# Patient Record
Sex: Female | Born: 1965 | Race: White | Hispanic: No | Smoking: Former smoker
Health system: Southern US, Community
[De-identification: ages and names within clinical notes are randomized; demographics above are authoritative.]

## PROBLEM LIST (undated history)

## (undated) DIAGNOSIS — Z8041 Family history of malignant neoplasm of ovary: Secondary | ICD-10-CM

## (undated) DIAGNOSIS — F329 Major depressive disorder, single episode, unspecified: Secondary | ICD-10-CM

## (undated) DIAGNOSIS — E7800 Pure hypercholesterolemia, unspecified: Secondary | ICD-10-CM

## (undated) DIAGNOSIS — C50919 Malignant neoplasm of unspecified site of unspecified female breast: Secondary | ICD-10-CM

## (undated) DIAGNOSIS — J449 Chronic obstructive pulmonary disease, unspecified: Secondary | ICD-10-CM

## (undated) DIAGNOSIS — C801 Malignant (primary) neoplasm, unspecified: Secondary | ICD-10-CM

## (undated) DIAGNOSIS — D649 Anemia, unspecified: Secondary | ICD-10-CM

## (undated) DIAGNOSIS — Z803 Family history of malignant neoplasm of breast: Secondary | ICD-10-CM

## (undated) DIAGNOSIS — Z973 Presence of spectacles and contact lenses: Secondary | ICD-10-CM

## (undated) DIAGNOSIS — I10 Essential (primary) hypertension: Secondary | ICD-10-CM

## (undated) DIAGNOSIS — T8859XA Other complications of anesthesia, initial encounter: Secondary | ICD-10-CM

## (undated) DIAGNOSIS — Z800 Family history of malignant neoplasm of digestive organs: Secondary | ICD-10-CM

## (undated) DIAGNOSIS — F32A Depression, unspecified: Secondary | ICD-10-CM

## (undated) DIAGNOSIS — T4145XA Adverse effect of unspecified anesthetic, initial encounter: Secondary | ICD-10-CM

## (undated) DIAGNOSIS — R011 Cardiac murmur, unspecified: Secondary | ICD-10-CM

## (undated) DIAGNOSIS — K219 Gastro-esophageal reflux disease without esophagitis: Secondary | ICD-10-CM

## (undated) DIAGNOSIS — E0500 Thyrotoxicosis with diffuse goiter without thyrotoxic crisis or storm: Secondary | ICD-10-CM

## (undated) DIAGNOSIS — J189 Pneumonia, unspecified organism: Secondary | ICD-10-CM

## (undated) HISTORY — DX: Depression, unspecified: F32.A

## (undated) HISTORY — DX: Major depressive disorder, single episode, unspecified: F32.9

## (undated) HISTORY — PX: CHOLECYSTECTOMY: SHX55

## (undated) HISTORY — PX: BREAST RECONSTRUCTION: SHX9

## (undated) HISTORY — DX: Family history of malignant neoplasm of digestive organs: Z80.0

## (undated) HISTORY — DX: Presence of spectacles and contact lenses: Z97.3

## (undated) HISTORY — PX: WISDOM TOOTH EXTRACTION: SHX21

## (undated) HISTORY — DX: Malignant (primary) neoplasm, unspecified: C80.1

## (undated) HISTORY — DX: Family history of malignant neoplasm of breast: Z80.3

## (undated) HISTORY — PX: MASTECTOMY: SHX3

## (undated) HISTORY — PX: BREAST FIBROADENOMA SURGERY: SHX580

## (undated) HISTORY — DX: Family history of malignant neoplasm of ovary: Z80.41

---

## 2007-07-11 ENCOUNTER — Other Ambulatory Visit: Admission: RE | Admit: 2007-07-11 | Discharge: 2007-07-11 | Payer: Self-pay | Admitting: Family Medicine

## 2008-07-25 ENCOUNTER — Other Ambulatory Visit: Admission: RE | Admit: 2008-07-25 | Discharge: 2008-07-25 | Payer: Self-pay | Admitting: Family Medicine

## 2009-07-28 ENCOUNTER — Encounter: Admission: RE | Admit: 2009-07-28 | Discharge: 2009-07-28 | Payer: Self-pay | Admitting: Obstetrics and Gynecology

## 2009-09-06 ENCOUNTER — Emergency Department: Payer: Self-pay | Admitting: Internal Medicine

## 2009-10-14 ENCOUNTER — Emergency Department (HOSPITAL_COMMUNITY): Admission: EM | Admit: 2009-10-14 | Discharge: 2009-10-15 | Payer: Self-pay | Admitting: Emergency Medicine

## 2009-10-15 ENCOUNTER — Inpatient Hospital Stay (HOSPITAL_COMMUNITY): Admission: AD | Admit: 2009-10-15 | Discharge: 2009-10-22 | Payer: Self-pay | Admitting: Psychiatry

## 2009-10-15 ENCOUNTER — Ambulatory Visit: Payer: Self-pay | Admitting: Psychiatry

## 2009-10-23 ENCOUNTER — Other Ambulatory Visit (HOSPITAL_COMMUNITY): Admission: RE | Admit: 2009-10-23 | Discharge: 2009-11-06 | Payer: Self-pay | Admitting: Psychiatry

## 2009-11-10 ENCOUNTER — Ambulatory Visit: Payer: Self-pay | Admitting: Psychiatry

## 2009-12-03 ENCOUNTER — Ambulatory Visit (HOSPITAL_COMMUNITY): Payer: Self-pay | Admitting: Psychiatry

## 2010-02-11 ENCOUNTER — Ambulatory Visit (HOSPITAL_COMMUNITY): Payer: Self-pay | Admitting: Physician Assistant

## 2010-04-28 ENCOUNTER — Encounter (HOSPITAL_COMMUNITY): Payer: Self-pay | Admitting: Physician Assistant

## 2010-04-28 DIAGNOSIS — F309 Manic episode, unspecified: Secondary | ICD-10-CM

## 2010-05-08 LAB — BASIC METABOLIC PANEL
Chloride: 106 mEq/L (ref 96–112)
GFR calc non Af Amer: >60 mL/min (ref >60)
Glucose, Bld: 97 mg/dL (ref 70–99)
Potassium: 4 mEq/L (ref 3.5–5.1)
Sodium: 138 mEq/L (ref 135–145)

## 2010-05-08 LAB — DIFFERENTIAL
Basophils Absolute: 0.1 10*3/uL (ref 0.0–0.1)
Basophils Relative: 1 % (ref 0–1)
Eosinophils Absolute: 0.6 10*3/uL (ref 0.0–0.7)
Neutro Abs: 4.1 10*3/uL (ref 1.7–7.7)

## 2010-05-08 LAB — CBC
HCT: 40.7 % (ref 36.0–46.0)
MCHC: 34.8 g/dL (ref 30.0–36.0)
MCV: 97.8 fL (ref 78.0–100.0)
Platelets: 234 10*3/uL (ref 150–400)
WBC: 6.9 10*3/uL (ref 4.0–10.5)

## 2010-05-08 LAB — SALICYLATE LEVEL: Salicylate Lvl: <4 mg/dL (ref 2.8–20.0)

## 2010-05-08 LAB — ACETAMINOPHEN LEVEL: Acetaminophen (Tylenol), Serum: <10 ug/mL — ABNORMAL LOW (ref 10–30)

## 2010-05-08 LAB — RAPID URINE DRUG SCREEN, HOSP PERFORMED
Amphetamines: POSITIVE — AB
Barbiturates: NOT DETECTED
Cocaine: NOT DETECTED

## 2010-05-08 LAB — TSH: TSH: 2.498 u[IU]/mL (ref 0.350–4.500)

## 2010-07-28 ENCOUNTER — Encounter (HOSPITAL_COMMUNITY): Payer: Self-pay | Admitting: Physician Assistant

## 2010-08-17 ENCOUNTER — Encounter (HOSPITAL_COMMUNITY): Payer: Self-pay | Admitting: Psychiatry

## 2010-08-17 DIAGNOSIS — F331 Major depressive disorder, recurrent, moderate: Secondary | ICD-10-CM

## 2010-11-09 ENCOUNTER — Encounter (INDEPENDENT_AMBULATORY_CARE_PROVIDER_SITE_OTHER): Payer: Self-pay | Admitting: Psychiatry

## 2010-11-09 DIAGNOSIS — F333 Major depressive disorder, recurrent, severe with psychotic symptoms: Secondary | ICD-10-CM

## 2010-11-25 ENCOUNTER — Encounter (INDEPENDENT_AMBULATORY_CARE_PROVIDER_SITE_OTHER): Payer: Self-pay | Admitting: Psychiatry

## 2010-11-25 DIAGNOSIS — F333 Major depressive disorder, recurrent, severe with psychotic symptoms: Secondary | ICD-10-CM

## 2010-12-16 ENCOUNTER — Encounter (INDEPENDENT_AMBULATORY_CARE_PROVIDER_SITE_OTHER): Payer: Self-pay | Admitting: Psychiatry

## 2010-12-16 DIAGNOSIS — F331 Major depressive disorder, recurrent, moderate: Secondary | ICD-10-CM

## 2011-01-20 ENCOUNTER — Encounter (HOSPITAL_COMMUNITY): Payer: Self-pay | Admitting: Psychiatry

## 2011-01-20 ENCOUNTER — Ambulatory Visit (HOSPITAL_COMMUNITY): Payer: Self-pay | Admitting: Psychiatry

## 2011-01-20 DIAGNOSIS — F331 Major depressive disorder, recurrent, moderate: Secondary | ICD-10-CM

## 2011-01-20 MED ORDER — BUPROPION HCL ER (XL) 300 MG PO TB24: 300.0000 mg | ORAL_TABLET | Freq: Every day | ORAL | Status: DC

## 2011-01-20 NOTE — Progress Notes (Signed)
Patient came for her followup appointment. She is taking Abilify now 5 mg which we reduced on her last visit from 10 mg. Patient doing better on 5 mg reported no crying spells depressive thoughts anger or sleep issue. However she recently has symptoms and complain of body ache and generalized weakness. She has taken some over-the-counter Benadryl which is helping her. Overall her depression has been stable she had a good Thanksgiving and she is working to had a good Christmas. She is more involved in her daily life and denies any crying spells. She denies any side effects of the Abilify and Wellbutrin.  Mental status emanation Patient is casually dressed and well-groomed. She is calm cooperative and maintained good eye contact. She denies any active or passive suicidal thinking. There are no psychotic symptoms present. She's alert and oriented x3. Her speech is soft clear and coherent. Her insight judgment and impulse control is okay.  Assessment Maj. depressive disorder  Plan I will continue her Wellbutrin and Abilify at present does. I explained risks and benefits of medication in detail. I will see her again in 2 months

## 2011-02-23 DIAGNOSIS — C801 Malignant (primary) neoplasm, unspecified: Secondary | ICD-10-CM

## 2011-02-23 HISTORY — DX: Malignant (primary) neoplasm, unspecified: C80.1

## 2011-03-03 ENCOUNTER — Other Ambulatory Visit: Payer: Self-pay | Admitting: Obstetrics and Gynecology

## 2011-03-03 DIAGNOSIS — Z1231 Encounter for screening mammogram for malignant neoplasm of breast: Secondary | ICD-10-CM

## 2011-03-04 ENCOUNTER — Ambulatory Visit
Admission: RE | Admit: 2011-03-04 | Discharge: 2011-03-04 | Disposition: A | Discharge status: Home / Self Care | Payer: Commercial Managed Care - PPO | Source: Ambulatory Visit | Attending: Obstetrics and Gynecology | Admitting: Obstetrics and Gynecology

## 2011-03-04 DIAGNOSIS — Z1231 Encounter for screening mammogram for malignant neoplasm of breast: Secondary | ICD-10-CM

## 2011-03-09 ENCOUNTER — Other Ambulatory Visit: Payer: Self-pay | Admitting: Obstetrics and Gynecology

## 2011-03-09 DIAGNOSIS — R928 Other abnormal and inconclusive findings on diagnostic imaging of breast: Secondary | ICD-10-CM

## 2011-03-15 ENCOUNTER — Encounter (HOSPITAL_COMMUNITY): Payer: Self-pay | Admitting: Psychiatry

## 2011-03-15 ENCOUNTER — Ambulatory Visit (INDEPENDENT_AMBULATORY_CARE_PROVIDER_SITE_OTHER): Payer: Commercial Managed Care - PPO | Admitting: Psychiatry

## 2011-03-15 VITALS — Wt 184.0 lb

## 2011-03-15 DIAGNOSIS — F329 Major depressive disorder, single episode, unspecified: Secondary | ICD-10-CM

## 2011-03-15 MED ORDER — ARIPIPRAZOLE 5 MG PO TABS: ORAL_TABLET | ORAL | Status: DC

## 2011-03-15 MED ORDER — BUPROPION HCL ER (XL) 300 MG PO TB24: 300.0000 mg | ORAL_TABLET | Freq: Every day | ORAL | Status: DC

## 2011-03-15 NOTE — Progress Notes (Signed)
Patient came for her followup appointment. She is taking Abilify 5 mg and doing very well. She denies any depressive thoughts crying spells or mood swings. She is sleeping well. She is still have a rash around her neck and recently seen her primary care physician but did not start any medication. Overall she feels her depression is under control. She had a quiet Christmas. She is more involved in her daily life. She denies any depressive thoughts helplessness or hopelessness. She is wondering if she can come off from Abilify completely. She is taking Wellbutrin XL 300 daily.  Mental status examination Patient is pleasant cooperative and appropriate to her age. Her speech is soft clear and coherent. She described her mood is anxious and her affect is mood congruent. She denies any active or passive suicidal thoughts or homicidal thoughts. There no psychotic symptoms present. She's alert and oriented x3. Her insight judgment and pulse control is okay.  Diagnosis Axis I Major depressive disorder Axis II deferred Axis III rash but no medication Axis IV mild to moderate Axis V 65-70  Plan I talked to the patient about her medication. I will cut down her Abilify to 2.5 mg however I reminded if she start feeling depressive again then she need to take 5 mg. I will continue her Wellbutrin. I explained risks and benefits of medication. I also explained side effects of medication including the metabolic side effects of Abilify. At this time patient does not have any tremors shakes or extrapyramidal side effects. I will see her again in 3 months

## 2011-03-23 ENCOUNTER — Ambulatory Visit
Admission: RE | Admit: 2011-03-23 | Discharge: 2011-03-23 | Disposition: A | Discharge status: Home / Self Care | Payer: Commercial Managed Care - PPO | Source: Ambulatory Visit | Attending: Obstetrics and Gynecology | Admitting: Obstetrics and Gynecology

## 2011-03-23 ENCOUNTER — Other Ambulatory Visit: Payer: Self-pay | Admitting: Diagnostic Radiology

## 2011-03-23 ENCOUNTER — Other Ambulatory Visit: Payer: Self-pay | Admitting: Obstetrics and Gynecology

## 2011-03-23 DIAGNOSIS — R928 Other abnormal and inconclusive findings on diagnostic imaging of breast: Secondary | ICD-10-CM

## 2011-03-23 DIAGNOSIS — R921 Mammographic calcification found on diagnostic imaging of breast: Secondary | ICD-10-CM

## 2011-03-24 ENCOUNTER — Other Ambulatory Visit: Payer: Self-pay | Admitting: Obstetrics and Gynecology

## 2011-03-24 ENCOUNTER — Ambulatory Visit
Admission: RE | Admit: 2011-03-24 | Discharge: 2011-03-24 | Disposition: A | Discharge status: Home / Self Care | Payer: Commercial Managed Care - PPO | Source: Ambulatory Visit | Attending: Obstetrics and Gynecology | Admitting: Obstetrics and Gynecology

## 2011-03-24 DIAGNOSIS — N63 Unspecified lump in unspecified breast: Secondary | ICD-10-CM

## 2011-03-24 DIAGNOSIS — C50911 Malignant neoplasm of unspecified site of right female breast: Secondary | ICD-10-CM

## 2011-03-24 DIAGNOSIS — R921 Mammographic calcification found on diagnostic imaging of breast: Secondary | ICD-10-CM

## 2011-03-26 ENCOUNTER — Other Ambulatory Visit: Payer: Self-pay | Admitting: *Deleted

## 2011-03-26 ENCOUNTER — Telehealth: Payer: Self-pay | Admitting: *Deleted

## 2011-03-26 DIAGNOSIS — D0510 Intraductal carcinoma in situ of unspecified breast: Secondary | ICD-10-CM

## 2011-03-26 NOTE — Telephone Encounter (Signed)
Confirmed [facility] for 03/31/11 at 0815 .  Instructions and contact information given.

## 2011-03-28 ENCOUNTER — Ambulatory Visit
Admission: RE | Admit: 2011-03-28 | Discharge: 2011-03-28 | Disposition: A | Discharge status: Home / Self Care | Payer: Commercial Managed Care - PPO | Source: Ambulatory Visit | Attending: Obstetrics and Gynecology | Admitting: Obstetrics and Gynecology

## 2011-03-28 DIAGNOSIS — C50911 Malignant neoplasm of unspecified site of right female breast: Secondary | ICD-10-CM

## 2011-03-28 MED ORDER — GADOBENATE DIMEGLUMINE 529 MG/ML IV SOLN
17.0000 mL | Freq: Once | INTRAVENOUS | Status: AC | PRN
  Administered 2011-03-28: 17 mL via INTRAVENOUS

## 2011-03-30 ENCOUNTER — Other Ambulatory Visit: Payer: Self-pay | Admitting: Obstetrics and Gynecology

## 2011-03-30 ENCOUNTER — Ambulatory Visit: Admission: RE | Admit: 2011-03-30 | Payer: Commercial Managed Care - PPO | Source: Ambulatory Visit

## 2011-03-30 ENCOUNTER — Telehealth: Payer: Self-pay | Admitting: Oncology

## 2011-03-30 ENCOUNTER — Ambulatory Visit
Admission: RE | Admit: 2011-03-30 | Discharge: 2011-03-30 | Disposition: A | Discharge status: Home / Self Care | Payer: Commercial Managed Care - PPO | Source: Ambulatory Visit | Attending: Obstetrics and Gynecology | Admitting: Obstetrics and Gynecology

## 2011-03-30 DIAGNOSIS — N63 Unspecified lump in unspecified breast: Secondary | ICD-10-CM

## 2011-03-30 DIAGNOSIS — R921 Mammographic calcification found on diagnostic imaging of breast: Secondary | ICD-10-CM

## 2011-03-30 NOTE — Telephone Encounter (Signed)
Robin from [facility] called me after receiving a call from a woman that resides in [place] by the name of Cecilio Asper;  She is this patient's mother.   Randa Evens is very anxious and scared that she is going to lose her daughter and now her granddaughter.   She stated that the granddaughter has a breast mass and called the [facility]- and was told she cannot have a mammogram until she is 70.   I explained to Randa Evens that typically mammograms are not a useful tool for young women- but explained that we do have a high risk clinic- and the patient and her daughters can be seen by that team.   The Patient is scheduled tomorrow in [facility]- so I will relay this information to Dr. Donnie Coffin who can speak to her about what to do regarding her daughters.

## 2011-03-31 ENCOUNTER — Other Ambulatory Visit: Payer: Commercial Managed Care - PPO | Admitting: Lab

## 2011-03-31 ENCOUNTER — Encounter: Payer: Self-pay | Admitting: *Deleted

## 2011-03-31 ENCOUNTER — Ambulatory Visit: Payer: Commercial Managed Care - PPO

## 2011-03-31 ENCOUNTER — Ambulatory Visit
Admission: RE | Admit: 2011-03-31 | Discharge: 2011-03-31 | Disposition: A | Discharge status: Home / Self Care | Payer: Commercial Managed Care - PPO | Source: Ambulatory Visit | Attending: Radiation Oncology | Admitting: Radiation Oncology

## 2011-03-31 ENCOUNTER — Encounter (INDEPENDENT_AMBULATORY_CARE_PROVIDER_SITE_OTHER): Payer: Self-pay | Admitting: General Surgery

## 2011-03-31 ENCOUNTER — Ambulatory Visit (HOSPITAL_BASED_OUTPATIENT_CLINIC_OR_DEPARTMENT_OTHER): Payer: Commercial Managed Care - PPO | Admitting: General Surgery

## 2011-03-31 ENCOUNTER — Ambulatory Visit: Payer: Commercial Managed Care - PPO | Attending: General Surgery | Admitting: Physical Therapy

## 2011-03-31 ENCOUNTER — Ambulatory Visit (HOSPITAL_BASED_OUTPATIENT_CLINIC_OR_DEPARTMENT_OTHER): Payer: Commercial Managed Care - PPO | Admitting: Oncology

## 2011-03-31 VITALS — BP 120/71 | HR 88 | Temp 98.2°F | Ht 64.5 in | Wt 183.2 lb

## 2011-03-31 DIAGNOSIS — C50911 Malignant neoplasm of unspecified site of right female breast: Secondary | ICD-10-CM

## 2011-03-31 DIAGNOSIS — C50919 Malignant neoplasm of unspecified site of unspecified female breast: Secondary | ICD-10-CM

## 2011-03-31 DIAGNOSIS — M25619 Stiffness of unspecified shoulder, not elsewhere classified: Secondary | ICD-10-CM | POA: Insufficient documentation

## 2011-03-31 DIAGNOSIS — F17200 Nicotine dependence, unspecified, uncomplicated: Secondary | ICD-10-CM

## 2011-03-31 DIAGNOSIS — C50912 Malignant neoplasm of unspecified site of left female breast: Secondary | ICD-10-CM

## 2011-03-31 DIAGNOSIS — D0590 Unspecified type of carcinoma in situ of unspecified breast: Secondary | ICD-10-CM

## 2011-03-31 DIAGNOSIS — Z720 Tobacco use: Secondary | ICD-10-CM

## 2011-03-31 DIAGNOSIS — D0510 Intraductal carcinoma in situ of unspecified breast: Secondary | ICD-10-CM

## 2011-03-31 DIAGNOSIS — IMO0001 Reserved for inherently not codable concepts without codable children: Secondary | ICD-10-CM | POA: Insufficient documentation

## 2011-03-31 DIAGNOSIS — F329 Major depressive disorder, single episode, unspecified: Secondary | ICD-10-CM

## 2011-03-31 LAB — CBC WITH DIFFERENTIAL/PLATELET
BASO%: 0.6 % (ref 0.0–2.0)
Eosinophils Absolute: 0.5 10*3/uL (ref 0.0–0.5)
MONO#: 0.6 10*3/uL (ref 0.1–0.9)
NEUT#: 3.5 10*3/uL (ref 1.5–6.5)
Platelets: 257 10*3/uL (ref 145–400)
RBC: 4.2 10*6/uL (ref 3.70–5.45)
RDW: 12.7 % (ref 11.2–14.5)
WBC: 6 10*3/uL (ref 3.9–10.3)
lymph#: 1.3 10*3/uL (ref 0.9–3.3)

## 2011-03-31 LAB — COMPREHENSIVE METABOLIC PANEL
ALT: 14 U/L (ref 0–35)
Albumin: 3.6 g/dL (ref 3.5–5.2)
CO2: 27 mEq/L (ref 19–32)
Glucose, Bld: 86 mg/dL (ref 70–99)
Potassium: 4.4 mEq/L (ref 3.5–5.3)
Sodium: 136 mEq/L (ref 135–145)
Total Protein: 7.3 g/dL (ref 6.0–8.3)

## 2011-03-31 NOTE — Progress Notes (Signed)
Patient ID: Donna Black, female   DOB: [birth year], 46 y.o.   MRN: [id number]  No chief complaint on file.   HPI Donna Black is a 46 y.o. female.  She is referred to me at the multidisciplinary clinic by Dr. Marice Potter at the breast center [place].  The patient has no major prior breast problems, although she has had fibroadenomas removed in the past.  She recently went for screening mammograms and subsequent ultrasound and a subsequent MRI. 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 There was a smaller area of microcalcifications in the upper-outer quadrant of the left breast which were biopsied and showed high-grade DCIS. Dr. Colonel Bald is performing further studies to see if there may be an area of invasion. She had an area in her left breast at the 6:00 position which was biopsied and shows a benign fibroadenoma.  There were two biopsies in the right breast and  one is strongly positive for ER/PR and the other  is negative for ER/PR.  We have discussed her case at multidisciplinary conference and in the clinic today. I am coordinating her care with Dr. Chipper Herb and Dr. Pierce Crane.  Significant medical history is that she has a major depressive disorder on medications, well controlled, tobacco abuse, remote history of Graves' disease treated with PTU but no radioiodine ablation. Her thyroid functions are followed by Dr. Johnn Hai. HPI  Past Medical History  Diagnosis Date  . Wears glasses   . Depression     Past Surgical History  Procedure Date  . Cholecystectomy   . Wisdom tooth extraction     Family History  Problem Relation Age of Onset  . Breast cancer Maternal Grandmother     Social History History  Substance Use Topics  . Smoking status: Current Everyday Smoker -- 0.5 packs/day    Types: Cigarettes  . Smokeless tobacco: Not on file    . Alcohol Use: 1.2 oz/week    2 Glasses of wine per week    No Known Allergies  Current Outpatient Prescriptions  Medication Sig Dispense Refill  . ARIPiprazole (ABILIFY) 5 MG tablet Take 1/2 to one  daily  90 tablet  0  . buPROPion (WELLBUTRIN XL) 300 MG 24 hr tablet Take 1 tablet (300 mg total) by mouth daily.  90 tablet  0    Review of Systems Review of Systems  Constitutional: Negative for fever, chills and unexpected weight change.  HENT: Negative for hearing loss, congestion, sore throat, trouble swallowing and voice change.   Eyes: Negative for visual disturbance.  Respiratory: Negative for cough and wheezing.   Cardiovascular: Negative for chest pain, palpitations and leg swelling.  Gastrointestinal: Negative for nausea, vomiting, abdominal pain, diarrhea, constipation, blood in stool, abdominal distention and anal bleeding.  Genitourinary: Negative for hematuria, vaginal bleeding and difficulty urinating.  Musculoskeletal: Negative for arthralgias.  Skin: Negative for rash and wound.  Neurological: Negative for seizures, syncope and headaches.  Hematological: Negative for adenopathy. Does not bruise/bleed easily.  Psychiatric/Behavioral: Negative for confusion. The patient is nervous/anxious.     There were no vitals taken for this visit.  Physical Exam Physical Exam  Constitutional: She is oriented to person, place, and time. She appears well-developed and well-nourished. No distress.  HENT:  Head: Normocephalic and atraumatic.  Nose: Nose normal.  Mouth/Throat: No oropharyngeal exudate.  Eyes: Conjunctivae and EOM are normal.  Pupils are equal, round, and reactive to light. Left eye exhibits no discharge. No scleral icterus.  Neck: Neck supple. No JVD present. No tracheal deviation present. No thyromegaly present.  Cardiovascular: Normal rate, regular rhythm, normal heart sounds and intact distal pulses.   No murmur heard. Pulmonary/Chest: Effort normal and breath  sounds normal. No respiratory distress. She has no wheezes. She has no rales. She exhibits no tenderness.         There are old biopsy scars in both breasts. There are recent needle biopsy scars in both breasts. There is minimal hematoma. There is no axillary adenopathy. There is no other skin change.  Abdominal: Soft. Bowel sounds are normal. She exhibits no distension and no mass. There is no tenderness. There is no rebound and no guarding.    Musculoskeletal: She exhibits no edema and no tenderness.  Lymphadenopathy:    She has no cervical adenopathy.  Neurological: She is alert and oriented to person, place, and time. She exhibits normal muscle tone. Coordination normal.  Skin: Skin is warm. No rash noted. She is not diaphoretic. No erythema. No pallor.  Psychiatric: She has a normal mood and affect. Her behavior is normal. Judgment and thought content normal.    Data Reviewed I have reviewed her imaging studies, her pathology reports, the pathology slides. I have discussed her case at conferences morning, and I have coordinated her care with doctors Dayton Scrape and [place].  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  Major depressive disorder, well-controlled on medication  Tobacco abuse  History of multiple breast biopsies  Remote history of Graves' disease, currently euthyroid by history    Plan    We had a very lengthy discussion about options for management. I told her that the large area in the upper outer quadrant of her right breast was probably not amenable to lumpectomy with good cosmetic result, and I advised her to have right total mastectomy and sentinel node  biopsy. The area in her left breast is smaller and technically we can consider lumpectomy or mastectomy, depending on her desires. We talked about plastic surgery reconstructive issues. We talked about genetic counseling. We talked about radiation therapy in general.We spent at least one hour going over all of this with the patient and her husband.  She was also seen by Dr. Pierce Crane and Dr. Chipper Herb today.  At the end of the conversation she is a little bit overwhelmed and did not have a clear idea about what she wants to do.  She'll be referred to Dr. Etter Sjogren for plastic surgical consultation to discuss immediate versus delayed reconstruction, symmetry issues depending on whether she chooses lumpectomy or mastectomy on the left.  She'll be referred for genetic counseling and genetic testing, but she is very much in favor of.she states that she will probably choose bilateral mastectomy if she is gene mutation positive.  She'll return to see me in 2-3 weeks after all this is done and we will have another conversation and hopefully make a decision about definitive surgical care. She seemed very comfortable with that plan.       Angelia Mould. Derrell Lolling, M.D., [facility] General and Minimally invasive Surgery Breast and Colorectal Surgery Office:  [phone] Pager:   [phone]  03/31/2011, 12:31 PM

## 2011-03-31 NOTE — Patient Instructions (Signed)
We have discussed management of your bilateral breast cancer. We have discussed numerous surgical options. You are going to be scheduled for genetic counseling and genetic testing. [facility] will refer you to Dr. Etter Sjogren for plastic surgery consultation. My office will contact you to make an appointment to return to see me in 2-3 weeks for a final decision making. Please call me sooner if you have any questions.

## 2011-03-31 NOTE — Progress Notes (Signed)
[facility] Radiation Oncology NEW PATIENT EVALUATION  Name: Donna Black MRN: [id number]  Date: 03/31/2011  DOB: [birth year]  Status: outpatient   CC: No primary provider on file.  Ernestene Mention, MD , Dr. Pierce Crane, Dr. Etter Sjogren, Dr. 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   HISTORY OF PRESENT ILLNESS:  Donna Black is a 46 y.o. female who is seen today at the [facility] for evaluation of her DCIS of the right and left breasts. She previous benign right breast biopsy in 2011. Her next screening mammogram was on 03/04/2011 which showed calcifications in both breasts with possible distortion the right breast and possible mass within left breast. Magnification views on general 29 2013 showed pleomorphic microcalcifications within an area of architectural distortion in the upper-outer quadrant of the right breast extending over an area of 3.2 x 3.6 x 2.6 cm. Magnification views of the left upper outer quadrant showed faint calcifications in density within the upper-outer quadrant, posteriorly. Spot compression view showed the presence of a partially circumscribed lobulated mass at 6:00 of the left breast. Ultrasound on the right showed the previously biopsied fibroadenoma at 11:00, 8 cm from the right nipple is 1.5 x 1.1 x 2.2 cm. No definitive mass was seen in the upper outer quadrant of the right breast. Ultrasound of the left breast showed an oval homogeneous nodule at 6:00, 4 cm from the nipple measuring 1.3 x 0.8 x 1.3 cm. This was felt to represent a fibroadenoma. There were cysts in the 1:00 position of left breast measuring 0.5 and 1.8 cm. the calcifications seen on the right were suspicious for DCIS. On the left the calcifications were slightly suspicious. She went biopsy of the right upper-outer quadrant showing DCIS, both high-grade and low-grade. On the left, 6:00 we have the verbal  report this represents a fibroadenoma. The area of faint calcifications along the upper-outer quadrant of the left she DCIS, but more sections are being reviewed pending a final report. The left breast biopsies were performed yesterday. Breast MRI on 03/28/2011 showed non-masslike enhancement along the upper outer quadrant of the right breast measuring 4.1 x 4.0 x 4.9 cm. On the left there was a 1.5 x 1.7 x 1.1 cm fibroadenoma seen at 6:00. No definite abnormal enhancement was seen in the area of calcification within the upper-outer quadrant of the left breast. No adenopathy was seen. As mentioned above, she has a family history of breast cancer involving her maternal grandmother diagnosed in her 23s, but died in her 79s. A maternal great aunt also had breast cancer. She seen today with Dr. Derrell Lolling in Dr. Donnie Coffin.   PREVIOUS RADIATION THERAPY: No   PAST MEDICAL HISTORY:  has a past medical history of Wears glasses and Depression.     PAST SURGICAL HISTORY:  Past Surgical History  Procedure Date  . Cholecystectomy   . Wisdom tooth extraction      FAMILY HISTORY: family history includes Breast cancer in her maternal grandmother. Her maternal grandmother was diagnosed with breast cancer in her 28s. She died in her 39s.   SOCIAL HISTORY:  reports that she has been smoking Cigarettes.  She has been smoking about .5 packs per day. She does not have any smokeless tobacco history on file. She reports that she drinks about 1.2 ounces of alcohol per week. She reports that she does not use illicit drugs. She  is currently studying business at [employer]. expected to graduate this May.   ALLERGIES: Review of patient's allergies indicates no known allergies.   MEDICATIONS:  Current Outpatient Prescriptions  Medication Sig Dispense Refill  . ARIPiprazole (ABILIFY) 5 MG tablet Take 1/2 to one  daily  90 tablet  0  . buPROPion (WELLBUTRIN XL) 300 MG 24 hr tablet Take 1 tablet (300 mg total) by mouth daily.  90  tablet  0      REVIEW OF SYSTEMS:  Pertinent items are noted in HPI.    PHYSICAL EXAM: Alert and oriented 46 year old white female appearing her stated age. Vital signs: BP 120/71, pulse 88, RR 20, T 90.2 Head and neck examination: Grossly unremarkable. Nodes: Without palpable cervical, supraclavicular, or axillary lymphadenopathy. Chest: Lungs clear. Back: Without spinal or CVA tenderness. Breasts: On the right breast there is a punctate biopsy wound at 12:00 with vague induration along the upper outer quadrant but no discrete mass. The left breast core biopsy was at 1:00 and 3:00 with ecchymosis along the lateral breast. No discreet masses palpable. Abdomen: Without masses or organomegaly. Extremities: Without edema. Neurologic examination: Grossly nonfocal.    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 With respect to her right breast, the area of involvement is quite extensive, we feel that she would probably best served, from a cosmetic standpoint, by a right mastectomy and sentinel lymph node biopsy. With respect to her left breast she could consider a left partial mastectomy followed radiation therapy. Her decision may be driven by genetic testing and her plans for breast reconstruction. She may be a candidate for a one step procedure with bilateral mastectomies, sentinel lymph node biopsies, and placement of tissue expanders. It is unlikely that she would need radiation therapy. We still need to wait for her final pathology with respect to her upper outer quadrant of the left breast  which should be available later today. I reviewed the potential acute and late toxicities of radiation therapy should she desire left breast preservation. At this time her prognosis appears to be quite favorable.   PLAN: As discussed above. Dr. Derrell Lolling will contact Dr. Etter Sjogren of plastic surgery, and she will also be scheduled for genetic counseling.   I spent 40 minutes minutes face to face with the patient and more than 50% of that time was spent in counseling and/or coordination of care.

## 2011-04-01 NOTE — Progress Notes (Signed)
Referral MD  Dr Johnn Hai Dr Edythe Lynn Dr Jamie Kato    Reason for Referral: Breast cancer   No chief complaint on file.  The  patient is a 46 year old woman from [place] here with her husband.  She underwent screening mammogram 03/04/2011 which demonstrated calcifications in both breasts possible distortion right breast and possible mass in the left breast. 1 bilateral ultrasound on 03/23/2011 which showed calcifications in the right upper outer quadrant measuring at least 3.6 cm. 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 An MRI of both breasts was performed 2 04/25/2011 showed an area of DCIS measuring at least 4.9 cm in the upper-outer quadrant of the right breast. 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 An additional lesions likely fibroadenoma seen in the left breast with associated calcifications.  HPI:   Past Medical History  Diagnosis Date  . Wears glasses   . Depression x 2 yrs followed bt dr Lolly Mustache [facility]   :  Past Surgical History  Procedure Date  . Cholecystectomy   . Wisdom tooth extraction   :previous fibroadenoma removal rt and lt breast 1988 and 2001 Current outpatient prescriptions:ARIPiprazole (ABILIFY) 5 MG tablet, Take 1/2 to one  daily, Disp: 90 tablet, Rfl: 0;  buPROPion (WELLBUTRIN XL) 300 MG 24 hr tablet, Take 1 tablet (300 mg total) by mouth daily., Disp: 90 tablet, Rfl: 0:    :  No Known Allergies:  Family History  Problem Relation Age of Onset  . Breast cancer Maternal Grandmother   :  History   Social History  . Marital Status: Married x 11y, previously married for short time    Husband Jonny Ruiz works for [employer]     Number of Children: 2 , twins Verdon Cummins 23  . Pt is completing degree at [employer]    Occupational History  . Not on file.   Social  History Main Topics  . Smoking status: Current Everyday Smoker -- 0.5 packs/day    Types: Cigarettes x 2 yrs  . Smokeless tobacco: Not on file  . Alcohol Use: 1.2 oz/week    2 Glasses of wine per week  . Drug Use: No  . Sexually Active: Not on file   Other Topics Concern  . Not on file- allergies-none   Social History Narrative  . No narrative on file  : Repro Hx: G3P3 Premenopausal No BCP  A comprehensive review of systems was negative.  Exam:   pleasant-appearing woman in no acute distress  \BP 120/71 Temperature 98.2 Pulse 88 Head and neck exam No palpable regional adenopathy, extraocular movements are normal oropharynx normal no scleral icterus. Chest : clear to auscultation and percussion  CVS: normal Abdomen: : normal Breasts: No palpable masses, ecchymoses present. Abdomen: no organomegaly Ext: normal   Basename 03/31/11 0812  WBC 6.0  HGB 14.1  HCT 40.6  PLT 257    Basename 03/31/11 0812  NA 136  K 4.4  CL 100  CO2 27  GLUCOSE 86  BUN 15  CREATININE 0.79  CALCIUM 10.3    Blood smear review: na  Pathology:as above  US Breast Bilateral  03/23/2011  *RADIOLOGY REPORT*  Clinical Data:  The patient returns for evaluation of calcifications in each breast and a possible mass in the left breast.  DIGITAL DIAGNOSTIC BILATERAL LIMITED MAMMOGRAM  AND BILATERAL BREAST  ULTRASOUND:  Comparison:  07/28/2009.  10/21/[facility] from [facility] in [place], [place].  Findings:  Magnification views demonstrate pleomorphic microcalcifications within an area of architectural distortion in the right upper outer quadrant.  Calcifications cover an area of approximately 3.2 x 3.6 x 2.6 cm.  Magnification views of the left upper outer quadrant demonstrate faint calcifications which very slightly in size and density in the left upper outer quadrant posteriorly.  Spot compression views confirm the presence of a partially circumscribed partially obscured gently  lobulated mass in the 6 o'clock position of the left breast. Mammographic images were processed with CAD.  On physical exam, no mass is palpated on the left.  There is thickening in the right mid upper outer quadrant.  Ultrasound is performed, showing the previously biopsied fibroadenoma 11 o'clock 8 cm from the right nipple measuring 1.5 x 1.1 x 2.2 cm. No other definite mass is noted in the right upper outer quadrant.  There are calcifications visualized at approximately 10 o'clock 4 cm from the right nipple.  Sonography of the left breast demonstrates an oval homogeneous nodule at 6 o'clock 4 cm from the left nipple measuring 1.3 x 0.8 x 1.3 cm.  This likely represents a fibroadenoma.  There are cysts in the 1 o'clock position of the left breast measuring 5 mm and 1.8 x 0.6 x 1.3 cm.  The calcifications on the right are highly suspicious for ductal carcinoma in situ.  The presence of the distortion suggest the possibility of invasive disease.  Biopsy would be recommended.  Calcifications on the left are slightly suspicious.  Given the high degree of suspicion on the right, I would suggest biopsy of the calcifications and the mass at 6 o'clock.  Findings were discussed with the patient and she agreed to proceed with stereotactic core needle biopsy of the calcifications bilaterally and ultrasound-guided biopsy of the mass at 6 o'clock on the left.  IMPRESSION: Highly suspicious calcifications in the right upper outer quadrant. Slightly suspicious calcifications in the left upper outer quadrant.  Oval solid mass at 6 o'clock in the left breast. Suggest biopsy of all three of these areas.  This will be performed and reported separately.  BI-RADS CATEGORY 5:  Highly suggestive of malignancy - appropriate action should be taken.  Original Report Authenticated By: Daryl Eastern, M.D.   Mr Breast Bilateral W Wo Contrast  03/29/2011  *RADIOLOGY REPORT*  Clinical Data: New diagnosis right-sided breast cancer.   BILATERAL BREAST MRI WITH AND WITHOUT CONTRAST  Technique: Multiplanar, multisequence MR images of both breasts were obtained prior to and following the intravenous administration of 17ml of Multihance.  Three dimensional images were evaluated at the independent DynaCad workstation.  Comparison:  Mammogram dated 03/23/2011  Findings: Severe background parenchymal enhancement is identified bilaterally.  Cysts are noted bilaterally.  Non mass like enhancement is seen in the upper central and upper-outer quadrant of the right breast involving the middle and posterior third of the breast measuring a total of 4.1 x 4.0 x 4.9 cm.  A biopsy clip is seen in association with the abnormal enhancement.  Biopsy-proven fibroadenoma is seen in the upper-outer quadrant of the right breast measuring 1.7 x 2.5 x 1.7 cm. No other mass or suspicious enhancement seen in the right breast.  A peripherally enhancing round mass with circumscribed margins and high T2 signal is seen in the lower central aspect of the left breast, posteriorly measuring 1.5 x 1.7 x 1.1 cm corresponding to the  possible fibroadenoma seen on recent ultrasound.  No definite abnormal enhancement seen in the area of calcification seen on recent MRI in the upper-outer quadrant of the left breast.  No other mass or suspicious enhancement is seen in the left breast.  No axillary or internal mammary adenopathy is seen.  IMPRESSION: Known malignancy, right breast as detailed above.  No adenopathy.  Mass, left breast, probable fibroadenoma for which ultrasound guided biopsy is scheduled.  THREE-DIMENSIONAL MR IMAGE RENDERING ON INDEPENDENT WORKSTATION:  Three-dimensional MR images were rendered by post-processing of the original MR data on an independent workstation.  The three- dimensional MR images were interpreted, and findings were reported in the accompanying complete MRI report for this study.  BI-RADS CATEGORY 6:  Known biopsy-proven malignancy - appropriate action  should be taken.  Original Report Authenticated By: Hiram Gash, M.D.   [name] Core Biopsy  03/31/2011  **ADDENDUM** CREATED: 03/31/2011 11:43:47  Pathology of the left breast mass 6 o'clock location demonstrates fibroadenoma.  Pathology of the calcifications in the left upper outer quadrant demonstrates DCIS. This is concordant with the imaging appearance at both locations.  The patient was notified at the time of multidisciplinary clinic on 03/31/2011.  Treatment plan is advised.  Addended by:  Harrel Lemon, M.D. on 03/31/2011 11:43:47.  **END ADDENDUM** SIGNED BY: Harrel Lemon, M.D.    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  Oval mass noted at 6 o'clock 4 cm from the left nipple.  ULTRASOUND GUIDED VACUUM ASSISTED CORE BIOPSY OF THE LEFT BREAST  Comparison: 03/23/2011  The patient and I discussed the procedure of ultrasound-guided biopsy, including benefits and alternatives.  We discussed the high likelihood of a successful procedure. We discussed the risks of the procedure, including infection, bleeding, tissue injury, clip migration and inadequate sampling.  Informed written consent was given.  Using sterile technique, 2% lidocaine, ultrasound guidance and a 12 gauge vacuum assisted needle, biopsy was performed of the mass at 6 o'clock 4 cm from the left nipple.  At the conclusion of the procedure, an InRad ribbon tissue marker clip was deployed into the biopsy cavity.  Follow-up 2-view mammogram was performed and dictated as part of the stereotactic biopsy of the left breast performed on the same day.  IMPRESSION: Ultrasound-guided biopsy of a mass at 6 o'clock 4 cm from the left nipple.  No apparent complications.  Original Report Authenticated By: Harrel Lemon, M.D.   Mm Breast Stereo Biopsy Left  03/31/2011  **ADDENDUM** CREATED: 03/31/2011 11:44:46  Pathology of the left breast mass 6 o'clock location demonstrates  fibroadenoma.  Pathology of the calcifications in the left upper outer quadrant demonstrates DCIS. This is concordant with the imaging appearance at both locations.  The patient was notified at the time of multidisciplinary clinic on 03/31/2011.  Treatment plan is advised.  Addended by:  Harrel Lemon, M.D. on 03/31/2011 11:44:46.  **END ADDENDUM** SIGNED BY: Harrel Lemon, M.D.    03/30/2011  *RADIOLOGY REPORT*  Clinical Data:  Microcalcifications in the left upper outer quadrant.  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  STEREOTACTIC-GUIDED VACUUM ASSISTED BIOPSY OF THE RIGHT BREAST AND SPECIMEN RADIOGRAPH  The patient and I discussed the procedure of stereotactic-guided biopsy, including benefits and alternatives.  We discussed the high likelihood of a successful procedure. We discussed the risks of the procedure, including infection, bleeding, tissue injury, clip migration and inadequate  sampling.  Informed written consent was given.  Using sterile technique, 2% lidocaine, stereotactic guidance and a 9 gauge vacuum assisted device, biopsy was performed of the calcifications in the left upper outer quadrant posteriorly. Specimen radiograph was performed, showing calcifications within multiple core specimens.  Specimens with calcifications are identified for pathology.  At the conclusion of the procedure, a dumbbell shaped tissue marker clip was deployed into the biopsy cavity.  Follow-up 2-view mammogram confirmed clip placement and removal of the calcifications. The InRad ribbon clip is appropriately positioned within the mass at 6 o'clock which was biopsied with ultrasound guidance.  IMPRESSION: Stereotactic-guided biopsy of calcifications in the left upper outer quadrant posteriorly.  No apparent complications.  Original Report Authenticated By: Harrel Lemon, M.D.   Mm Breast Stereo Biopsy Right  03/23/2011  *RADIOLOGY REPORT*  Clinical Data:  Highly suspicious  calcifications in the right upper outer quadrant.  STEREOTACTIC-GUIDED VACUUM ASSISTED BIOPSY OF THE RIGHT BREAST AND SPECIMEN RADIOGRAPH  The patient and I discussed the procedure of stereotactic-guided biopsy, including benefits and alternatives.  We discussed the high likelihood of a successful procedure. We discussed the risks of the procedure, including infection, bleeding, tissue injury, clip migration and inadequate sampling.  Informed written consent was given.  Using sterile technique, 2% lidocaine, stereotactic guidance and a 9 gauge vacuum assisted device, biopsy was performed of the calcifications in the right upper outer quadrant.  Specimen radiograph was performed, showing calcifications within one of the core specimens.  Specimens with calcifications are identified for pathology.  At the conclusion of the procedure, a top hat shaped tissue marker clip was deployed into the biopsy cavity.  Follow-up 2-view mammogram confirmed clip placement.  We attempted to perform a stereotactic core needle biopsy of the calcifications in the left breast.  However due to persistent pain despite additional local  anesthetic administration, this procedure was terminated.  Biopsy of the areas in the left breast will be rescheduled.  IMPRESSION: Stereotactic-guided biopsy of calcifications in the right upper outer quadrant.  No apparent complications.  Biopsies on the left will be rescheduled.  Original Report Authenticated By: Daryl Eastern, M.D.   Mm Breast Surgical Specimen  03/31/2011  **ADDENDUM** CREATED: 03/31/2011 11:44:46  Pathology of the left breast mass 6 o'clock location demonstrates fibroadenoma.  Pathology of the calcifications in the left upper outer quadrant demonstrates DCIS. This is concordant with the imaging appearance at both locations.  The patient was notified at the time of multidisciplinary clinic on 03/31/2011.  Treatment plan is advised.  Addended by:  Harrel Lemon, M.D. on 03/31/2011  11:44:46.  **END ADDENDUM** SIGNED BY: Harrel Lemon, M.D.    03/30/2011  *RADIOLOGY REPORT*  Clinical Data:  Microcalcifications in the left upper outer quadrant.  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  STEREOTACTIC-GUIDED VACUUM ASSISTED BIOPSY OF THE RIGHT BREAST AND SPECIMEN RADIOGRAPH  The patient and I discussed the procedure of stereotactic-guided biopsy, including benefits and alternatives.  We discussed the high likelihood of a successful procedure. We discussed the risks of the procedure, including infection, bleeding, tissue injury, clip migration and inadequate sampling.  Informed written consent was given.  Using sterile technique, 2% lidocaine, stereotactic guidance and a 9 gauge vacuum assisted device, biopsy was performed of the calcifications in the left upper outer quadrant posteriorly. Specimen radiograph was performed, showing calcifications within multiple core specimens.  Specimens with calcifications are identified for pathology.  At the conclusion of the procedure, a dumbbell  shaped tissue marker clip was deployed into the biopsy cavity.  Follow-up 2-view mammogram confirmed clip placement and removal of the calcifications. The InRad ribbon clip is appropriately positioned within the mass at 6 o'clock which was biopsied with ultrasound guidance.  IMPRESSION: Stereotactic-guided biopsy of calcifications in the left upper outer quadrant posteriorly.  No apparent complications.  Original Report Authenticated By: Harrel Lemon, M.D.   Mm Breast Surgical Specimen  03/23/2011  *RADIOLOGY REPORT*  Clinical Data:  Highly suspicious calcifications in the right upper outer quadrant.  STEREOTACTIC-GUIDED VACUUM ASSISTED BIOPSY OF THE RIGHT BREAST AND SPECIMEN RADIOGRAPH  The patient and I discussed the procedure of stereotactic-guided biopsy, including benefits and alternatives.  We discussed the high likelihood of a successful procedure. We discussed the risks of  the procedure, including infection, bleeding, tissue injury, clip migration and inadequate sampling.  Informed written consent was given.  Using sterile technique, 2% lidocaine, stereotactic guidance and a 9 gauge vacuum assisted device, biopsy was performed of the calcifications in the right upper outer quadrant.  Specimen radiograph was performed, showing calcifications within one of the core specimens.  Specimens with calcifications are identified for pathology.  At the conclusion of the procedure, a top hat shaped tissue marker clip was deployed into the biopsy cavity.  Follow-up 2-view mammogram confirmed clip placement.  We attempted to perform a stereotactic core needle biopsy of the calcifications in the left breast.  However due to persistent pain despite additional local  anesthetic administration, this procedure was terminated.  Biopsy of the areas in the left breast will be rescheduled.  IMPRESSION: Stereotactic-guided biopsy of calcifications in the right upper outer quadrant.  No apparent complications.  Biopsies on the left will be rescheduled.  Original Report Authenticated By: Daryl Eastern, M.D.   Mm [facility] [employer]  03/23/2011  *RADIOLOGY REPORT*  Clinical Data:  The patient returns for evaluation of calcifications in each breast and a possible mass in the left breast.  DIGITAL DIAGNOSTIC BILATERAL LIMITED MAMMOGRAM  AND BILATERAL BREAST ULTRASOUND:  Comparison:  07/28/2009.  10/21/[facility] from [facility] in [place], [place].  Findings:  Magnification views demonstrate pleomorphic microcalcifications within an area of architectural distortion in the right upper outer quadrant.  Calcifications cover an area of approximately 3.2 x 3.6 x 2.6 cm.  Magnification views of the left upper outer quadrant demonstrate faint calcifications which very slightly in size and density in the left upper outer quadrant posteriorly.  Spot compression views confirm the presence of a  partially circumscribed partially obscured gently lobulated mass in the 6 o'clock position of the left breast. Mammographic images were processed with CAD.  On physical exam, no mass is palpated on the left.  There is thickening in the right mid upper outer quadrant.  Ultrasound is performed, showing the previously biopsied fibroadenoma 11 o'clock 8 cm from the right nipple measuring 1.5 x 1.1 x 2.2 cm. No other definite mass is noted in the right upper outer quadrant.  There are calcifications visualized at approximately 10 o'clock 4 cm from the right nipple.  Sonography of the left breast demonstrates an oval homogeneous nodule at 6 o'clock 4 cm from the left nipple measuring 1.3 x 0.8 x 1.3 cm.  This likely represents a fibroadenoma.  There are cysts in the 1 o'clock position of the left breast measuring 5 mm and 1.8 x 0.6 x 1.3 cm.  The calcifications on the right are highly suspicious for ductal carcinoma in situ.  The presence of the distortion suggest the possibility of invasive disease.  Biopsy would be recommended.  Calcifications on the left are slightly suspicious.  Given the high degree of suspicion on the right, I would suggest biopsy of the calcifications and the mass at 6 o'clock.  Findings were discussed with the patient and she agreed to proceed with stereotactic core needle biopsy of the calcifications bilaterally and ultrasound-guided biopsy of the mass at 6 o'clock on the left.  IMPRESSION: Highly suspicious calcifications in the right upper outer quadrant. Slightly suspicious calcifications in the left upper outer quadrant.  Oval solid mass at 6 o'clock in the left breast. Suggest biopsy of all three of these areas.  This will be performed and reported separately.  BI-RADS CATEGORY 5:  Highly suggestive of malignancy - appropriate action should be taken.  Original Report Authenticated By: Daryl Eastern, M.D.   Mm Digital Screening  03/05/2011  DG SCREEN MAMMOGRAM BILATERAL Bilateral CC  and MLO view(s) were taken.  DIGITAL SCREENING MAMMOGRAM WITH CAD: The breast tissue is extremely dense.  Microcalcifications are present in both breasts.   Characterization with magnification views is recommended.  Possible distortion is noted in the  right breast.  A possible mass is noted in the left breast.  Spot compression views and possibly  sonography are recommended for further evaluation.  Images were processed with CAD.  IMPRESSION: Calcifications, bilateral breasts, possible distortion right breast and possible mass left breast.  Additional evaluation is indicated. The patient will be contacted for additional studies and a  supplementary report will follow.  ASSESSMENT: Need additional imaging evaluation and/or prior mammograms for comparison - BI-RADS 0  Further imaging of both breasts. ,   Mm Radiologist Eval And Mgmt  03/24/2011  *RADIOLOGY REPORT*  ESTABLISHED PATIENT OFFICE VISIT - LEVEL II [phone])  Chief Complaint:  The patient returns today for right breast biopsy results and to evaluate bilateral breast biopsy sites.  History:  Suspicious right breast calcifications status post stereotactic guided biopsy yesterday.  Attempt at left breast stereotactic biopsy, but unable to be completed secondary to patient discomfort and pain. The patient has no complaints with her biopsy sites.  Exam:  The biopsy sites within both breasts were clean and dry without evidence of hematoma or infection.  Assessment and Plan:  Pathology of the right stereotactic biopsy demonstrates DCIS WITH POSSIBLE INVASION.  Further stains to evaluate invasion are pending.  Recommend ultrasound guided left breast biopsy and stereotactic guided left breast biopsy to evaluate the previously demonstrated left breast abnormalities.  These biopsies have been scheduled for 03/30/2011 and the patient informed. Recommend bilateral breast MRI, which has been scheduled for 03/28/2011 and the patient informed. Recommend surgery/oncology  consultation - an appointment at the [facility] has been scheduled for 03/31/2011 and the patient form.  Original Report Authenticated By: Rosendo Gros, M.D.    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 Discussed genetic testing as well given her young age and positive family history From a medical oncology point of view she might be best served with taking tamoxifen after her surgery. If she has bilateral mastectomies and there is no evidence of invasion this may be more than moot point I did mention that should likely undergo sentinel lymph node evaluation and if there  is evidence of invasion a discussion about possible chemotherapy might be appropriate. I will plan to see the patient after she has had her definitive surgery.    60 minutes was spent with this patient to half the time and patient-related counseling   Pierce Crane M.D. FRCP C 03/30/11

## 2011-04-02 ENCOUNTER — Encounter: Payer: Self-pay | Admitting: *Deleted

## 2011-04-02 NOTE — Progress Notes (Signed)
Mailed after appt letter to pt. 

## 2011-04-05 ENCOUNTER — Telehealth: Payer: Self-pay | Admitting: *Deleted

## 2011-04-05 ENCOUNTER — Ambulatory Visit: Payer: Commercial Managed Care - PPO

## 2011-04-05 ENCOUNTER — Encounter: Payer: Self-pay | Admitting: Specialist

## 2011-04-05 ENCOUNTER — Ambulatory Visit: Payer: Commercial Managed Care - PPO | Admitting: Lab

## 2011-04-05 NOTE — Progress Notes (Signed)
I met patient in the breast [facility]DC on March 31, 2011.  She was accompanied by her husband.  She expressed being surprised at the recommendation for a mastectomy.  She has three children, two of whom are grown, and one of whom is a ten-year-old son.  She is concerned about how to tell her son about her cancer.  I told Donna Black about the support services at [facility], particularly the Breast Cancer Support Groups and [facility]; at her request, I have made a [facility] referral on her behalf.

## 2011-04-05 NOTE — Progress Notes (Signed)
Pt seen for genetic counseling.  Blood drawn for BRCA 1/2 

## 2011-04-05 NOTE — Telephone Encounter (Signed)
Spoke with pt concerning BMDC from 2/6.  Pt denies questions or concerns regarding dx.  Pt relate she has decided to have bilateral mastectomies with immediate reconstruction. Encourage pt to call with needs.  Received verbal understanding.  Contact information given.

## 2011-04-06 ENCOUNTER — Other Ambulatory Visit (INDEPENDENT_AMBULATORY_CARE_PROVIDER_SITE_OTHER): Payer: Self-pay | Admitting: General Surgery

## 2011-04-06 DIAGNOSIS — C50919 Malignant neoplasm of unspecified site of unspecified female breast: Secondary | ICD-10-CM

## 2011-04-08 ENCOUNTER — Encounter: Payer: Self-pay | Admitting: *Deleted

## 2011-04-20 ENCOUNTER — Ambulatory Visit (INDEPENDENT_AMBULATORY_CARE_PROVIDER_SITE_OTHER): Payer: Commercial Managed Care - PPO | Admitting: General Surgery

## 2011-04-20 ENCOUNTER — Encounter (INDEPENDENT_AMBULATORY_CARE_PROVIDER_SITE_OTHER): Payer: Self-pay | Admitting: General Surgery

## 2011-04-20 ENCOUNTER — Telehealth (INDEPENDENT_AMBULATORY_CARE_PROVIDER_SITE_OTHER): Payer: Self-pay

## 2011-04-20 ENCOUNTER — Other Ambulatory Visit (INDEPENDENT_AMBULATORY_CARE_PROVIDER_SITE_OTHER): Payer: Self-pay

## 2011-04-20 VITALS — BP 114/82 | HR 76 | Temp 98.0°F | Resp 16 | Ht 64.0 in | Wt 184.0 lb

## 2011-04-20 DIAGNOSIS — D0590 Unspecified type of carcinoma in situ of unspecified breast: Secondary | ICD-10-CM

## 2011-04-20 DIAGNOSIS — D0510 Intraductal carcinoma in situ of unspecified breast: Secondary | ICD-10-CM

## 2011-04-20 NOTE — Patient Instructions (Signed)
You are scheduled for bilateral total mastectomy, bilateral sentinel lymph node biopsy, and immediate reconstruction. Dr. Etter Sjogren and I have coordinated this surgery.

## 2011-04-20 NOTE — Telephone Encounter (Signed)
Called Tami at the cancer center to get results to BRCA testing, she was out of the office until 04/23/11 called [facility], she did not see were results were back but put a call into Annia Friendly to call with the results.

## 2011-04-20 NOTE — Progress Notes (Addendum)
Subjective:     Patient ID: Donna Black, female   DOB: 05-22-1965, 46 y.o.   MRN: [id number]  HPI This 46 year old woman has bilateral breast cancer. She was seen in the breast multidisciplinary clinic on April 01, 2011.  She has seen Dr. Etter Sjogren. She has decided to proceed with bilateral total mastectomy and bilateral sentinel node biopsy and immediate bilateral implant based reconstruction.  Genetic testing results are pending.  I spent a long time speaking with the patient and her husband about the indications and details of the surgery. She is aware of the techniques involved and the numerous potential risks. She has stopped smoking and I have congratulated her on that.  Review of Systems     Objective:   Physical Exam The patient is alert. She is in no distress. She seems comfortable with her decisions.  Neck no adenopathy or mass  Breasts both breasts are examined. There is no hematoma or signs of infection from her recent image guided biopsies.   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 Date of surgery is May 12, 2011.  Details and techniques of surgery and risk were discussed in great detail. She understands these issues and her questions were answered. She agrees with this plan.   Angelia Mould. Derrell Lolling, M.D., [facility] General and  Minimally invasive Surgery Breast and Colorectal Surgery Office:   [phone] Pager:   [phone]

## 2011-04-22 ENCOUNTER — Telehealth (INDEPENDENT_AMBULATORY_CARE_PROVIDER_SITE_OTHER): Payer: Self-pay

## 2011-04-22 NOTE — Telephone Encounter (Signed)
Pt advised of insurance delay in processing gene study. See recent encounter note.

## 2011-04-22 NOTE — Telephone Encounter (Signed)
Per Misty Stanley at [facility] pts genetic test result will be delayed due to insurance the has. The lab requires pre cert response back from insurance carrier before they will complete study. Misty Stanley will fax result as soon as it is complete.

## 2011-04-28 ENCOUNTER — Encounter (HOSPITAL_COMMUNITY): Payer: Self-pay | Admitting: Pharmacy Technician

## 2011-04-30 ENCOUNTER — Other Ambulatory Visit (HOSPITAL_COMMUNITY): Payer: Self-pay | Admitting: *Deleted

## 2011-05-03 ENCOUNTER — Encounter (HOSPITAL_COMMUNITY)
Admission: RE | Admit: 2011-05-03 | Discharge: 2011-05-03 | Disposition: A | Discharge status: Home / Self Care | Payer: Commercial Managed Care - PPO | Source: Ambulatory Visit | Attending: General Surgery | Admitting: General Surgery

## 2011-05-03 ENCOUNTER — Encounter (HOSPITAL_COMMUNITY): Payer: Self-pay

## 2011-05-03 HISTORY — DX: Anemia, unspecified: D64.9

## 2011-05-03 HISTORY — DX: Cardiac murmur, unspecified: R01.1

## 2011-05-03 HISTORY — DX: Thyrotoxicosis with diffuse goiter without thyrotoxic crisis or storm: E05.00

## 2011-05-03 LAB — CBC
HCT: 40.6 % (ref 36.0–46.0)
MCHC: 35.5 g/dL (ref 30.0–36.0)
RDW: 12.6 % (ref 11.5–15.5)
WBC: 6.3 10*3/uL (ref 4.0–10.5)

## 2011-05-03 LAB — COMPREHENSIVE METABOLIC PANEL
ALT: 18 U/L (ref 0–35)
AST: 17 U/L (ref 0–37)
Albumin: 3.6 g/dL (ref 3.5–5.2)
Alkaline Phosphatase: 47 U/L (ref 39–117)
Chloride: 104 mEq/L (ref 96–112)
Potassium: 4.4 mEq/L (ref 3.5–5.1)
Sodium: 138 mEq/L (ref 135–145)
Total Bilirubin: 0.3 mg/dL (ref 0.3–1.2)
Total Protein: 7 g/dL (ref 6.0–8.3)

## 2011-05-03 LAB — URINALYSIS, ROUTINE W REFLEX MICROSCOPIC
Bilirubin Urine: NEGATIVE
Glucose, UA: NEGATIVE mg/dL
Hgb urine dipstick: NEGATIVE
Ketones, ur: NEGATIVE mg/dL
Leukocytes, UA: NEGATIVE
Protein, ur: NEGATIVE mg/dL
pH: 7 (ref 5.0–8.0)

## 2011-05-03 LAB — HCG, SERUM, QUALITATIVE: Preg, Serum: NEGATIVE

## 2011-05-03 LAB — SURGICAL PCR SCREEN: Staphylococcus aureus: NEGATIVE

## 2011-05-03 LAB — CANCER ANTIGEN 27.29: CA 27.29: 24 U/mL (ref 0–39)

## 2011-05-03 NOTE — Pre-Procedure Instructions (Addendum)
20 Donna Black  05/03/2011   Your procedure is scheduled on:  05/12/11  Report to [facility] at 7:30 AM.  Call this number if you have problems the morning of surgery: [phone]   Remember: Discontinue all Aspirin, Plavix, Coumadin, Effient and herbal medications. Bring Incentive Spirometer back in suitcase day of surgery.   Do not eat food:After Midnight.  May have clear liquids: up to 4 Hours before arrival (4:30 AM).  Clear liquids include soda, tea, black coffee, apple or grape juice, broth.  Take these medicines the morning of surgery with A SIP OF WATER: Abilify and Wellbutrin   Do not wear jewelry, make-up or nail polish.  Do not wear lotions, powders, or perfumes. You may wear deodorant.  Do not shave 48 hours prior to surgery.  Do not bring valuables to the hospital.  Contacts, dentures or bridgework may not be worn into surgery.  Leave suitcase in the car. After surgery it may be brought to your room.  For patients admitted to the hospital, checkout time is 11:00 AM the day of discharge.   Patients discharged the day of surgery will not be allowed to drive home.  Name and phone number of your driver: Being admitted.  Special Instructions: CHG Shower Use Special Wash: 1/2 bottle night before surgery and 1/2 bottle morning of surgery.   Please read over the following fact sheets that you were given: Pain Booklet, Coughing and Deep Breathing, MRSA Information and Surgical Site Infection Prevention

## 2011-05-11 MED ORDER — VANCOMYCIN HCL IN DEXTROSE 1-5 GM/200ML-% IV SOLN
1000.0000 mg | INTRAVENOUS | Status: DC
  Filled 2011-05-11 (×2): qty 200

## 2011-05-11 NOTE — H&P (Signed)
Massie Cogliano    MRN: [id number]   Description: 46 year old female  Provider: Ernestene Mention, MD  Department: [facility]    Diagnoses     Cancer of right breast   - Primary    174.9    Major depressive disorder     296.20    Tobacco abuse     305.1    Cancer of left breast     174.9         History and Physical    Ernestene Mention, MD   Patient ID: Donna Black, female   DOB: 07/29/1965, 46 y.o.   MRN: [id number]      HPI Donna Black is a 46 y.o. female.  She is referred to me at the multidisciplinary clinic by Dr. Marice Potter at the breast center [facility].  The patient has no major prior breast problems, although she has had fibroadenomas removed in the past.  She recently went for screening mammograms and subsequent ultrasound and a subsequent MRI. 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 There was a smaller area of microcalcifications in the upper-outer quadrant of the left breast which were biopsied and showed high-grade DCIS. Dr. Colonel Bald is performing further studies to see if there may be an area of invasion. She had an area in her left breast at the 6:00 position which was biopsied and shows a benign fibroadenoma.  There were two biopsies in the right breast and  one is strongly positive for ER/PR and the other  is negative for ER/PR.  We have discussed her case at multidisciplinary conference and in the clinic. I am coordinating her care with Dr. Chipper Herb and Dr. Pierce Crane and Dr. Etter Sjogren.  Significant medical history is that she has a major depressive disorder on medications, well controlled, tobacco abuse, remote history of Graves' disease treated with PTU but no radioiodine ablation. Her thyroid functions are followed by Dr. Johnn Hai.  She has seen Dr. Etter Sjogren. She has decided to proceed with  bilateral total mastectomy and bilateral sentinel node biopsy and immediate bilateral implant based reconstruction.    I have spent a long time speaking with the patient and her husband about the indications and details of the surgery. She is aware of the techniques involved and the numerous potential risks.       Past Medical History   Diagnosis  Date   .  Wears glasses     .  Depression         Past Surgical History   Procedure  Date   .  Cholecystectomy     .  Wisdom tooth extraction         Family History   Problem  Relation  Age of Onset   .  Breast cancer  Maternal Grandmother        Social History History   Substance Use Topics   .  Smoking status:  Current Everyday Smoker -- 0.5 packs/day       Types:  Cigarettes   .  Smokeless tobacco:  Not on file   .  Alcohol Use:  1.2 oz/week       2 Glasses of wine per week      No Known Allergies    Current Outpatient Prescriptions   Medication  Sig  Dispense  Refill   .  ARIPiprazole (ABILIFY) 5 MG tablet  Take 1/2 to one  daily   90 tablet   0   .  buPROPion (WELLBUTRIN XL) 300 MG 24 hr tablet  Take 1 tablet (300 mg total) by mouth daily.   90 tablet   0      Review of Systems   Constitutional: Negative for fever, chills and unexpected weight change.  HENT: Negative for hearing loss, congestion, sore throat, trouble swallowing and voice change.   Eyes: Negative for visual disturbance.  Respiratory: Negative for cough and wheezing.   Cardiovascular: Negative for chest pain, palpitations and leg swelling.  Gastrointestinal: Negative for nausea, vomiting, abdominal pain, diarrhea, constipation, blood in stool, abdominal distention and anal bleeding.  Genitourinary: Negative for hematuria, vaginal bleeding and difficulty urinating.  Musculoskeletal: Negative for arthralgias.  Skin: Negative for rash and wound.  Neurological: Negative for seizures, syncope and headaches.  Hematological: Negative for adenopathy. Does  not bruise/bleed easily.  Psychiatric/Behavioral: Negative for confusion. The patient is nervous/anxious.        Physical Exam  Constitutional: She is oriented to person, place, and time. She appears well-developed and well-nourished. No distress.  HENT:   Head: Normocephalic and atraumatic.   Nose: Nose normal.   Mouth/Throat: No oropharyngeal exudate.  Eyes: Conjunctivae and EOM are normal. Pupils are equal, round, and reactive to light. Left eye exhibits no discharge. No scleral icterus.  Neck: Neck supple. No JVD present. No tracheal deviation present. No thyromegaly present.  Cardiovascular: Normal rate, regular rhythm, normal heart sounds and intact distal pulses.    No murmur heard. Pulmonary/Chest: Effort normal and breath sounds normal. No respiratory distress. She has no wheezes. She has no rales. She exhibits no tenderness. .   Breasts:     There are old biopsy scars in both breasts. There are recent needle biopsy scars in both breasts. There is minimal hematoma. There is no axillary adenopathy. There is no other skin change.  Abdominal: Soft. Bowel sounds are normal. She exhibits no distension and no mass. There is no tenderness. There is no rebound and no guarding.    Musculoskeletal: She exhibits no edema and no tenderness.  Lymphadenopathy:    She has no cervical adenopathy.  Neurological: She is alert and oriented to person, place, and time. She exhibits normal muscle tone. Coordination normal.  Skin: Skin is warm. No rash noted. She is not diaphoretic. No erythema. No pallor.  Psychiatric: She has a normal mood and affect. Her behavior is normal. Judgment and thought content normal.    Data Reviewed I have reviewed her imaging studies, her pathology reports, the pathology slides. I have discussed her case at conferences morning, and I have coordinated her care with doctors Dayton Scrape and Gr[place].   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  Major depressive disorder, well-controlled on medication  Tobacco abuse  History of multiple breast biopsies  Remote history of Graves' disease, currently euthyroid by history   Plan We had a very lengthy discussion about options for management. I told her that the large area in the upper outer quadrant of her right breast was probably not amenable to lumpectomy with good cosmetic result, and I advised her to have right total mastectomy and sentinel node biopsy. The  area in her left breast is smaller and technically we can consider lumpectomy or mastectomy, depending on her desires. We talked about plastic surgery reconstructive issues. We talked about genetic counseling. We talked about radiation therapy in general.We spent at least one hour going over all of this with the patient and her husband.  She was also seen by Dr. Pierce Crane and Dr. Chipper Herb in the [facility].  She has seen Dr. Etter Sjogren for plastic surgical consultation to discuss immediate versus delayed reconstruction, symmetry issues depending on whether she chooses lumpectomy or mastectomy on the left. She has decided on bilateral mastectomy, bilateral SLN biopsy and immediate implant reconstruction. This will be scheduled.   She has been  referred for genetic counseling and genetic testing.Angelia Mould. Derrell Lolling, M.D., [facility] General and Minimally invasive Surgery Breast and Colorectal Surgery Office:   [phone] Pager:   [phone]

## 2011-05-12 ENCOUNTER — Encounter (HOSPITAL_COMMUNITY): Payer: Self-pay | Admitting: Anesthesiology

## 2011-05-12 ENCOUNTER — Encounter (HOSPITAL_COMMUNITY)
Admission: RE | Disposition: A | Discharge status: Home / Self Care | Payer: Self-pay | Source: Ambulatory Visit | Attending: General Surgery

## 2011-05-12 ENCOUNTER — Ambulatory Visit (HOSPITAL_COMMUNITY)
Admission: RE | Admit: 2011-05-12 | Discharge: 2011-05-12 | Disposition: A | Discharge status: Home / Self Care | Payer: Commercial Managed Care - PPO | Source: Ambulatory Visit | Attending: General Surgery | Admitting: General Surgery

## 2011-05-12 ENCOUNTER — Ambulatory Visit (HOSPITAL_COMMUNITY): Payer: Commercial Managed Care - PPO | Admitting: Anesthesiology

## 2011-05-12 ENCOUNTER — Inpatient Hospital Stay (HOSPITAL_COMMUNITY)
Admission: RE | Admit: 2011-05-12 | Discharge: 2011-05-14 | DRG: 580 | Disposition: A | Discharge status: Home / Self Care | Payer: Commercial Managed Care - PPO | Source: Ambulatory Visit | Attending: General Surgery | Admitting: General Surgery

## 2011-05-12 ENCOUNTER — Encounter: Payer: Self-pay | Admitting: Oncology

## 2011-05-12 DIAGNOSIS — D0590 Unspecified type of carcinoma in situ of unspecified breast: Principal | ICD-10-CM | POA: Diagnosis present

## 2011-05-12 DIAGNOSIS — D0510 Intraductal carcinoma in situ of unspecified breast: Secondary | ICD-10-CM | POA: Insufficient documentation

## 2011-05-12 DIAGNOSIS — C50919 Malignant neoplasm of unspecified site of unspecified female breast: Secondary | ICD-10-CM

## 2011-05-12 DIAGNOSIS — F329 Major depressive disorder, single episode, unspecified: Secondary | ICD-10-CM | POA: Diagnosis present

## 2011-05-12 DIAGNOSIS — F17200 Nicotine dependence, unspecified, uncomplicated: Secondary | ICD-10-CM | POA: Diagnosis present

## 2011-05-12 DIAGNOSIS — Z01812 Encounter for preprocedural laboratory examination: Secondary | ICD-10-CM

## 2011-05-12 HISTORY — PX: BREAST RECONSTRUCTION: SHX9

## 2011-05-12 SURGERY — SIMPLE MASTECTOMY WITH AXILLARY SENTINEL NODE BIOPSY
Anesthesia: General | Site: Breast | Laterality: Bilateral | Wound class: Clean

## 2011-05-12 MED ORDER — SUFENTANIL CITRATE 50 MCG/ML IV SOLN
INTRAVENOUS | Status: DC | PRN
  Administered 2011-05-12: 10 ug via INTRAVENOUS
  Administered 2011-05-12: 5 ug via INTRAVENOUS
  Administered 2011-05-12 (×3): 10 ug via INTRAVENOUS
  Administered 2011-05-12: 5 ug via INTRAVENOUS
  Administered 2011-05-12: 20 ug via INTRAVENOUS
  Administered 2011-05-12 (×2): 10 ug via INTRAVENOUS

## 2011-05-12 MED ORDER — ONDANSETRON HCL 4 MG/2ML IJ SOLN: 4.0000 mg | Freq: Four times a day (QID) | INTRAMUSCULAR | Status: DC | PRN

## 2011-05-12 MED ORDER — DOCUSATE SODIUM 100 MG PO CAPS
100.0000 mg | ORAL_CAPSULE | Freq: Every day | ORAL | Status: DC
  Administered 2011-05-13 – 2011-05-14 (×2): 100 mg via ORAL
  Filled 2011-05-12 (×2): qty 1

## 2011-05-12 MED ORDER — DEXTROSE-NACL 5-0.45 % IV SOLN
INTRAVENOUS | Status: DC
  Administered 2011-05-12 – 2011-05-13 (×2): via INTRAVENOUS
  Administered 2011-05-13: 125 mL/h via INTRAVENOUS

## 2011-05-12 MED ORDER — CEFAZOLIN SODIUM 1-5 GM-% IV SOLN
INTRAVENOUS | Status: DC | PRN
  Administered 2011-05-12: 2 g via INTRAVENOUS

## 2011-05-12 MED ORDER — SODIUM CHLORIDE 0.9 % IR SOLN
Status: DC | PRN
  Administered 2011-05-12: 12:00:00

## 2011-05-12 MED ORDER — SODIUM CHLORIDE 0.9 % IR SOLN
Status: DC | PRN
  Administered 2011-05-12: 14:00:00

## 2011-05-12 MED ORDER — HYDROMORPHONE BOLUS VIA INFUSION
0.5000 mg | Freq: Once | INTRAVENOUS | Status: AC
  Administered 2011-05-12: 0.5 mg via INTRAVENOUS

## 2011-05-12 MED ORDER — DIPHENHYDRAMINE HCL 12.5 MG/5ML PO ELIX
12.5000 mg | ORAL_SOLUTION | Freq: Four times a day (QID) | ORAL | Status: DC | PRN
  Filled 2011-05-12: qty 5

## 2011-05-12 MED ORDER — CHLORHEXIDINE GLUCONATE 4 % EX LIQD: 60.0000 mL | Freq: Once | CUTANEOUS | Status: DC

## 2011-05-12 MED ORDER — ONDANSETRON HCL 4 MG/2ML IJ SOLN
INTRAMUSCULAR | Status: DC | PRN
  Administered 2011-05-12: 4 mg via INTRAVENOUS

## 2011-05-12 MED ORDER — NEOSTIGMINE METHYLSULFATE 1 MG/ML IJ SOLN
INTRAMUSCULAR | Status: DC | PRN
  Administered 2011-05-12: 3 mg via INTRAVENOUS

## 2011-05-12 MED ORDER — SODIUM CHLORIDE 0.9 % IJ SOLN: 9.0000 mL | INTRAMUSCULAR | Status: DC | PRN

## 2011-05-12 MED ORDER — NALOXONE HCL 0.4 MG/ML IJ SOLN: 0.4000 mg | INTRAMUSCULAR | Status: DC | PRN

## 2011-05-12 MED ORDER — PROPOFOL 10 MG/ML IV EMUL
INTRAVENOUS | Status: DC | PRN
  Administered 2011-05-12: 130 mg via INTRAVENOUS

## 2011-05-12 MED ORDER — SODIUM CHLORIDE 0.9 % IJ SOLN
INTRAMUSCULAR | Status: DC | PRN
  Administered 2011-05-12: 6 mL

## 2011-05-12 MED ORDER — MIDAZOLAM HCL 5 MG/5ML IJ SOLN
INTRAMUSCULAR | Status: DC | PRN
  Administered 2011-05-12 (×2): 1 mg via INTRAVENOUS

## 2011-05-12 MED ORDER — TECHNETIUM TC 99M SULFUR COLLOID FILTERED
2.0000 | Freq: Once | INTRAVENOUS | Status: AC | PRN
  Administered 2011-05-12: 2 via INTRADERMAL

## 2011-05-12 MED ORDER — CHLORHEXIDINE GLUCONATE 4 % EX LIQD
1.0000 | Freq: Once | CUTANEOUS | Status: DC
Start: 2011-05-13 — End: 2011-05-12

## 2011-05-12 MED ORDER — PROMETHAZINE HCL 25 MG/ML IJ SOLN: 6.2500 mg | INTRAMUSCULAR | Status: DC | PRN

## 2011-05-12 MED ORDER — ALBUMIN HUMAN 5 % IV SOLN
INTRAVENOUS | Status: DC | PRN
  Administered 2011-05-12: 13:00:00 via INTRAVENOUS

## 2011-05-12 MED ORDER — ARIPIPRAZOLE 5 MG PO TABS
2.5000 mg | ORAL_TABLET | Freq: Every day | ORAL | Status: DC
  Administered 2011-05-13: 11:00:00 via ORAL
  Administered 2011-05-14: 5 mg via ORAL
  Filled 2011-05-12 (×2): qty 1

## 2011-05-12 MED ORDER — HYDROMORPHONE 0.3 MG/ML IV SOLN
INTRAVENOUS | Status: DC
  Administered 2011-05-12: 15:00:00 via INTRAVENOUS
  Administered 2011-05-12: 0.2 mg via INTRAVENOUS
  Administered 2011-05-12: 1.39 mg via INTRAVENOUS
  Administered 2011-05-13: 2.39 mg via INTRAVENOUS
  Administered 2011-05-13: 1.39 mg via INTRAVENOUS
  Administered 2011-05-13: 1.99 mg via INTRAVENOUS

## 2011-05-12 MED ORDER — DIPHENHYDRAMINE HCL 50 MG/ML IJ SOLN: 12.5000 mg | Freq: Four times a day (QID) | INTRAMUSCULAR | Status: DC | PRN

## 2011-05-12 MED ORDER — SODIUM CHLORIDE 0.9 % IJ SOLN
INTRAMUSCULAR | Status: DC | PRN
  Administered 2011-05-12: 10:00:00 via INTRAMUSCULAR

## 2011-05-12 MED ORDER — LACTATED RINGERS IV SOLN
INTRAVENOUS | Status: DC | PRN
  Administered 2011-05-12 (×4): via INTRAVENOUS

## 2011-05-12 MED ORDER — METHOCARBAMOL 500 MG PO TABS
500.0000 mg | ORAL_TABLET | Freq: Four times a day (QID) | ORAL | Status: DC | PRN
  Administered 2011-05-13 – 2011-05-14 (×6): 500 mg via ORAL
  Filled 2011-05-12 (×6): qty 1

## 2011-05-12 MED ORDER — CEFAZOLIN SODIUM 1-5 GM-% IV SOLN
1.0000 g | Freq: Three times a day (TID) | INTRAVENOUS | Status: DC
  Administered 2011-05-12 – 2011-05-14 (×7): 1 g via INTRAVENOUS
  Filled 2011-05-12 (×8): qty 50

## 2011-05-12 MED ORDER — PHENYLEPHRINE HCL 10 MG/ML IJ SOLN
INTRAMUSCULAR | Status: DC | PRN
  Administered 2011-05-12: 80 ug via INTRAVENOUS

## 2011-05-12 MED ORDER — GLYCOPYRROLATE 0.2 MG/ML IJ SOLN
INTRAMUSCULAR | Status: DC | PRN
  Administered 2011-05-12: .4 mg via INTRAVENOUS

## 2011-05-12 MED ORDER — ROCURONIUM BROMIDE 100 MG/10ML IV SOLN
INTRAVENOUS | Status: DC | PRN
  Administered 2011-05-12: 20 mg via INTRAVENOUS
  Administered 2011-05-12: 50 mg via INTRAVENOUS
  Administered 2011-05-12 (×2): 10 mg via INTRAVENOUS
  Administered 2011-05-12: 20 mg via INTRAVENOUS

## 2011-05-12 MED ORDER — LIDOCAINE HCL (CARDIAC) 20 MG/ML IV SOLN
INTRAVENOUS | Status: DC | PRN
  Administered 2011-05-12: 50 mg via INTRAVENOUS

## 2011-05-12 SURGICAL SUPPLY — 71 items
APPLIER CLIP 9.375 MED OPEN (MISCELLANEOUS) ×2
ATCH SMKEVC FLXB CAUT HNDSWH (FILTER) ×1 IMPLANT
BAG DECANTER FOR FLEXI CONT (MISCELLANEOUS) ×2 IMPLANT
BINDER BREAST LRG (GAUZE/BANDAGES/DRESSINGS) IMPLANT
BINDER BREAST XLRG (GAUZE/BANDAGES/DRESSINGS) ×2 IMPLANT
BIOPATCH RED 1 DISK 7.0 (GAUZE/BANDAGES/DRESSINGS) ×4 IMPLANT
BLADE SURG 15 STRL LF DISP TIS (BLADE) IMPLANT
BLADE SURG 15 STRL SS (BLADE)
CANISTER SUCTION 2500CC (MISCELLANEOUS) ×4 IMPLANT
CHLORAPREP W/TINT 26ML (MISCELLANEOUS) ×4 IMPLANT
CLIP APPLIE 9.375 MED OPEN (MISCELLANEOUS) ×1 IMPLANT
CLOTH BEACON ORANGE TIMEOUT ST (SAFETY) ×4 IMPLANT
CONT SPEC 4OZ CLIKSEAL STRL BL (MISCELLANEOUS) ×2 IMPLANT
COVER PROBE W GEL 5X96 (DRAPES) ×4 IMPLANT
COVER SURGICAL LIGHT HANDLE (MISCELLANEOUS) ×4 IMPLANT
DERMABOND ADVANCED (GAUZE/BANDAGES/DRESSINGS) ×1
DERMABOND ADVANCED .7 DNX12 (GAUZE/BANDAGES/DRESSINGS) ×1 IMPLANT
DRAIN CHANNEL 19F RND (DRAIN) ×6 IMPLANT
DRAPE CHEST BREAST 15X10 FENES (DRAPES) ×2 IMPLANT
DRAPE ORTHO SPLIT 77X108 STRL (DRAPES) ×2
DRAPE PROXIMA HALF (DRAPES) ×6 IMPLANT
DRAPE SURG 17X23 STRL (DRAPES) ×4 IMPLANT
DRAPE SURG ORHT 6 SPLT 77X108 (DRAPES) ×2 IMPLANT
DRAPE WARM FLUID 44X44 (DRAPE) ×2 IMPLANT
DRESSING TELFA 8X3 (GAUZE/BANDAGES/DRESSINGS) IMPLANT
DRSG PAD ABDOMINAL 8X10 ST (GAUZE/BANDAGES/DRESSINGS) ×2 IMPLANT
DRSG TEGADERM 4X4.75 (GAUZE/BANDAGES/DRESSINGS) ×2 IMPLANT
ELECT BLADE 4.0 EZ CLEAN MEGAD (MISCELLANEOUS) ×4
ELECT BLADE 6.5 EXT (BLADE) ×2 IMPLANT
ELECT CAUTERY BLADE 6.4 (BLADE) ×4 IMPLANT
ELECT REM PT RETURN 9FT ADLT (ELECTROSURGICAL) ×4
ELECTRODE BLDE 4.0 EZ CLN MEGD (MISCELLANEOUS) ×2 IMPLANT
ELECTRODE REM PT RTRN 9FT ADLT (ELECTROSURGICAL) ×2 IMPLANT
EVACUATOR SILICONE 100CC (DRAIN) ×6 IMPLANT
EVACUATOR SMOKE ACCUVAC VALLEY (FILTER) ×1
GAUZE XEROFORM 5X9 LF (GAUZE/BANDAGES/DRESSINGS) IMPLANT
GLOVE BIO SURGEON STRL SZ7 (GLOVE) ×2 IMPLANT
GLOVE BIO SURGEON STRL SZ7.5 (GLOVE) ×2 IMPLANT
GLOVE BIOGEL PI IND STRL 8 (GLOVE) ×1 IMPLANT
GLOVE BIOGEL PI INDICATOR 8 (GLOVE) ×1
GLOVE EUDERMIC 7 POWDERFREE (GLOVE) ×4 IMPLANT
GOWN PREVENTION PLUS XLARGE (GOWN DISPOSABLE) ×4 IMPLANT
GOWN STRL NON-REIN LRG LVL3 (GOWN DISPOSABLE) ×6 IMPLANT
KIT BASIN OR (CUSTOM PROCEDURE TRAY) ×4 IMPLANT
KIT ROOM TURNOVER OR (KITS) ×4 IMPLANT
NEEDLE 18GX1X1/2 (RX/OR ONLY) (NEEDLE) ×4 IMPLANT
NEEDLE HYPO 25GX1X1/2 BEV (NEEDLE) ×4 IMPLANT
NS IRRIG 1000ML POUR BTL (IV SOLUTION) ×6 IMPLANT
PACK GENERAL/GYN (CUSTOM PROCEDURE TRAY) ×4 IMPLANT
PAD ARMBOARD 7.5X6 YLW CONV (MISCELLANEOUS) ×4 IMPLANT
PEN SKIN MARKING BROAD (MISCELLANEOUS) ×2 IMPLANT
PREFILTER EVAC NS 1 1/3-3/8IN (MISCELLANEOUS) ×2 IMPLANT
SPECIMEN JAR X LARGE (MISCELLANEOUS) ×2 IMPLANT
SPONGE GAUZE 4X4 12PLY (GAUZE/BANDAGES/DRESSINGS) ×2 IMPLANT
STAPLER VISISTAT 35W (STAPLE) ×2 IMPLANT
STRIP CLOSURE SKIN 1/2X4 (GAUZE/BANDAGES/DRESSINGS) ×2 IMPLANT
SUT ETHILON 3 0 FSL (SUTURE) ×2 IMPLANT
SUT MNCRL AB 3-0 PS2 18 (SUTURE) ×2 IMPLANT
SUT MNCRL AB 4-0 PS2 18 (SUTURE) ×4 IMPLANT
SUT PDS AB 3-0 SH 27 (SUTURE) IMPLANT
SUT PROLENE 3 0 PS 2 (SUTURE) ×4 IMPLANT
SUT SILK 2 0 FS (SUTURE) ×2 IMPLANT
SUT VIC AB 3-0 SH 18 (SUTURE) ×6 IMPLANT
SYR BULB IRRIGATION 50ML (SYRINGE) ×4 IMPLANT
SYR CONTROL 10ML LL (SYRINGE) ×4 IMPLANT
TISSUE EXPANDER 550CC MED H (Prosthesis & Implant Plastic) ×2 IMPLANT
TOWEL OR 17X24 6PK STRL BLUE (TOWEL DISPOSABLE) ×4 IMPLANT
TOWEL OR 17X26 10 PK STRL BLUE (TOWEL DISPOSABLE) ×6 IMPLANT
TRAY FOLEY CATH 14FRSI W/METER (CATHETERS) ×2 IMPLANT
TUBE CONNECTING 12X1/4 (SUCTIONS) ×2 IMPLANT
WATER STERILE IRR 1000ML POUR (IV SOLUTION) IMPLANT

## 2011-05-12 NOTE — Op Note (Signed)
Patient Name:           Donna Black   Date of Surgery:        05/12/2011  Pre op Diagnosis:      #1)  ductal carcinoma in situ with possible microinvasion, upper outer quadrant right breast, receptor positive in one area and receptor negative and another area. The                                      #2) ductal carcinoma in situ left breast, upper outer quadrant, high-grade, possible microinvasion  Post op Diagnosis:    same  Procedure:                 In inject blue dye right breast, inject blue dye left breast, bilateral total mastectomy, bilateral axillary sentinel lymph node mapping and biopsy.  Surgeon:                     Angelia Mould. Derrell Lolling, M.D., FACS  Assistant:                      Manus Rudd., M.D.  Operative Indications:   This is a 46 year old Caucasian female who had screening mammograms recently and then had subsequent ultrasound and MRI and biopsy. There was a 5 cm area of calcifications in the upper outer quadrant of the right breast which was biopsied and showed ductal carcinoma in situ and possible microinvasion. There was more than one area that was sampled for receptors and one area was receptor positive and one was receptor negative. There was a small area of microcalcifications in the upper outer quadrant of the left breast which was biopsied and showed high-grade ductal carcinoma in situ possible microinvasion. MRI showed no adenopathy. She was evaluated in the breast multidisciplinary clinic. She has had several  discussions with all of the physicians involved and has also had a consultation with Dr. Etter Sjogren. Genetic testing was negative. She elected bilateral mastectomy with sentinel lymph node biopsy and immediate implant based reconstruction.  Operative Findings:       I found 4 sentinel lymph nodes on the left and 2 on the right. None of them appeared pathologic. There was no palpable abnormality in either breast. The skin flaps looked good at the completion of  the mastectomies.  Procedure in Detail:          The patient was brought to the holding area at [facility] operating room. The nuclear medicine technician injected radionuclide into both breasts. The patient was taken to the operating room. General endotracheal anesthesia was induced. Intravenous antibiotics were given. A Foley catheter was inserted. Surgical time out was performed.  Following an alcohol prep I injected 5 cc of blue dye into both breast, retroareolar area. On each side this was 2 cc of methylene blue mixed with 3 cc of saline. I massaged the breasts for 5 minutes.  The neck and chest and upper abdomen and axilla and shoulders were then prepped and draped in the usual sterile fashion.  I used a marking pen to mark the anatomical boundaries of the breast and the midline. I then marked mirror-image skin sparing elliptical incisions.  I operated on  the left side first. Transverse elliptical incision was made. Skin flaps were raised superiorly to the infraclavicular area, medially to the parasternal area, inferiorly to the anterior rectus  sheath and laterally to just  above the latissimus dorsi muscle. The breast was dissected off of the pectoralis major and minor muscle. Using the neoprobe I dissected out four sentinel lymph nodes. These all had blue dye and they all had radioactivity in them. This appeared to be all the sentinel nodes. The breast was marked with a silk suture to mark the lateral skin margin and  the specimen was removed. Hemostasis was excellent and achieved with  electrocautery and metal clips. The wound was irrigated with saline and then packed with saline moistened gauze.  I then turned my attention to the right breast. A mirror image transverse elliptical incision was made. Skin flaps were raised superiorly, medially, and inferiorly, and laterally in a similar fashion, and the breast was dissected off of the pectoralis major and minor muscle it with  electrocautery. I dissected up into the level I axilla and found 2 sentinel lymph nodes on the right. These had blue dye and strong radioactivity. They were also sent for routine histology. The breast specimen was removed. The lateral skin margin was marked with a silk suture. The wound was irrigated with saline and packed with saline moistened gauze.  At this point in the case estimated blood loss was 150 cc. No complications. Counts correct. The patient was stable.  At this point Dr. Etter Sjogren scrubbed in to assume control of the operative procedure. His reconstructive procedure will be dictated separately.      Angelia Mould. Derrell Lolling, M.D., FACS General and Minimally Invasive Surgery Breast and Colorectal Surgery  05/12/2011 12:02 PM

## 2011-05-12 NOTE — Transfer of Care (Signed)
Immediate Anesthesia Transfer of Care Note  Patient: Donna Black  Procedure(s) Performed: Procedure(s) (LRB): SIMPLE MASTECTOMY WITH AXILLARY SENTINEL NODE BIOPSY (Bilateral) BREAST RECONSTRUCTION (Bilateral)  Patient Location: PACU  Anesthesia Type: General  Level of Consciousness: awake, alert  and sedated  Airway & Oxygen Therapy: Patient Spontanous Breathing and Patient connected to nasal cannula oxygen  Post-op Assessment: Report given to PACU RN, Post -op Vital signs reviewed and stable, Patient moving all extremities and Patient moving all extremities X 4  Post vital signs: Reviewed and stable  Complications: No apparent anesthesia complications

## 2011-05-12 NOTE — Anesthesia Postprocedure Evaluation (Signed)
Anesthesia Post Note  Patient: Donna Black  Procedure(s) Performed: Procedure(s) (LRB): SIMPLE MASTECTOMY WITH AXILLARY SENTINEL NODE BIOPSY (Bilateral) BREAST RECONSTRUCTION (Bilateral)  Anesthesia type: general  Patient location: PACU  Post pain: Pain level controlled  Post assessment: Patient's Cardiovascular Status Stable  Last Vitals:  Filed Vitals:   05/12/11 1509  BP:   Pulse:   Temp:   Resp: 14    Post vital signs: Reviewed and stable  Level of consciousness: sedated  Complications: No apparent anesthesia complications

## 2011-05-12 NOTE — Anesthesia Preprocedure Evaluation (Addendum)
Anesthesia Evaluation  Patient identified by MRN, date of birth, ID band Patient awake    Reviewed: Allergy & Precautions  Airway Mallampati: II TM Distance: >3 FB     Dental  (+) Teeth Intact and Dental Advisory Given   Pulmonary Current Smoker,    Pulmonary exam normal       Cardiovascular negative cardio ROS  Rate:Normal     Neuro/Psych Depression    GI/Hepatic negative GI ROS, Neg liver ROS,   Endo/Other  negative endocrine ROS  Renal/GU negative Renal ROS     Musculoskeletal negative musculoskeletal ROS (+)   Abdominal   Peds  Hematology negative hematology ROS (+)   Anesthesia Other Findings   Reproductive/Obstetrics negative OB ROS                           Anesthesia Physical Anesthesia Plan  ASA: II  Anesthesia Plan:    Post-op Pain Management:    Induction: Intravenous  Airway Management Planned: Oral ETT  Additional Equipment:   Intra-op Plan:   Post-operative Plan: Extubation in OR  Informed Consent: I have reviewed the patients History and Physical, chart, labs and discussed the procedure including the risks, benefits and alternatives for the proposed anesthesia with the patient or authorized representative who has indicated his/her understanding and acceptance.   Dental advisory given  Plan Discussed with: CRNA and Anesthesiologist  Anesthesia Plan Comments:        Anesthesia Quick Evaluation

## 2011-05-12 NOTE — Brief Op Note (Signed)
05/12/2011  2:37 PM  PATIENT:  Donna Black  46 y.o. female  PRE-OPERATIVE DIAGNOSIS:  Bilateral breast cancer  POST-OPERATIVE DIAGNOSIS:  Bilateral breast cancer  PROCEDURE:  Procedure(s) (LRB): SIMPLE MASTECTOMY WITH AXILLARY SENTINEL NODE BIOPSY (Bilateral) BREAST RECONSTRUCTION (Bilateral) with tissue expanders  SURGEON:  Surgeon(s) and Role: Panel 1:    * Ernestene Mention, MD - Primary    * Wilmon Arms. Corliss Skains, MD - Assisting  Panel 2:    * Etter Sjogren, MD - Primary  PHYSICIAN ASSISTANT:   ASSISTANTS: none   ANESTHESIA:   general  EBL:  Total I/O In: 3450 [I.V.:3200; IV Piggyback:250] Out: 435 [Urine:115; Other:120; Blood:200]  BLOOD ADMINISTERED:none  DRAINS: (4) Jackson-Pratt drain(s) with closed bulb suction in the 2 on left and 2 on right   LOCAL MEDICATIONS USED:  NONE  SPECIMEN:  No Specimen  DISPOSITION OF SPECIMEN:  N/A  COUNTS:  YES  TOURNIQUET:  * No tourniquets in log *  DICTATION: .Other Dictation: Dictation Number [id number]  PLAN OF CARE: Admit to inpatient   PATIENT DISPOSITION:  PACU - hemodynamically stable.   Delay start of Pharmacological VTE agent (>24hrs) due to surgical blood loss or risk of bleeding: yes

## 2011-05-12 NOTE — Interval H&P Note (Signed)
History and Physical Interval Note:  05/12/2011 8:36 AM  Donna Black  has presented today for surgery, with the diagnosis of bilateral breast cancer  The goals of treatment and the various methods of treatment have been discussed with the patient and family. After consideration of risks, benefits and other options for treatment, the patient has consented to  Procedure(s) (LRB): BILATERAL SIMPLE MASTECTOMY WITH BILATERAL AXILLARY SENTINEL NODE BIOPSY (Bilateral) BREAST RECONSTRUCTION (Bilateral) as a surgical intervention .  The patients' history has been reviewed today , the patient is examined today, no change in status, stable for surgery.  I have reviewed the patients' chart and labs.  Questions were answered to the patient's satisfaction.     Ernestene Mention

## 2011-05-13 ENCOUNTER — Encounter (HOSPITAL_COMMUNITY): Payer: Self-pay | Admitting: *Deleted

## 2011-05-13 MED ORDER — HYDROMORPHONE HCL 2 MG PO TABS
2.0000 mg | ORAL_TABLET | ORAL | Status: DC | PRN
  Administered 2011-05-13 – 2011-05-14 (×7): 4 mg via ORAL
  Filled 2011-05-13 (×7): qty 2

## 2011-05-13 MED ORDER — ACETAMINOPHEN 325 MG PO TABS
650.0000 mg | ORAL_TABLET | ORAL | Status: DC | PRN
  Administered 2011-05-13: 650 mg via ORAL
  Filled 2011-05-13: qty 2

## 2011-05-13 MED ORDER — HYDROMORPHONE 0.3 MG/ML IV SOLN
INTRAVENOUS | Status: AC
  Filled 2011-05-13: qty 25

## 2011-05-13 MED FILL — Hydromorphone HCl Inj 1 MG/ML: INTRAMUSCULAR | Qty: 1 | Status: AC

## 2011-05-13 NOTE — Progress Notes (Signed)
UR complete 

## 2011-05-13 NOTE — Op Note (Signed)
Donna Black, Donna Black             ACCOUNT NO.:  [id number]  MEDICAL RECORD NO.:  [id number]  LOCATION:  5156                         FACILITY:  [facility]  PHYSICIAN:  Etter Sjogren, M.D.     DATE OF BIRTH:  09-Aug-1965  DATE OF PROCEDURE:  05/12/2011 DATE OF DISCHARGE:                              OPERATIVE REPORT   PREOPERATIVE DIAGNOSIS:  Breast cancer.  POSTOPERATIVE DIAGNOSIS:  Breast cancer.  PROCEDURE PERFORMED:  Bilateral breast reconstruction with tissue expander.  SURGEON:  Etter Sjogren, MD  ANESTHESIA:  General.  ESTIMATED BLOOD LOSS:  50 mL.  DRAINS:  Two 19-French drains on each side.  CLINICAL NOTE:  A 46 year old woman has breast cancer who is having bilateral mastectomy with sentinel lymph node and is having bilateral reconstruction.  Options were discussed with her in detail, and she elected to use tissue expanders at the time of mastectomy followed as a staged procedure later by placement of the implants.  The nature of these procedures, risks, and possible complications were discussed with her in detail.  Risks include, but not limited to bleeding, infection, anesthesia related complications, healing problems, scarring, loss of sensation, fluid accumulations, pneumothorax, pulmonary embolism, failure of device, capsular contracture, displacement of device, wrinkles, ripples, asymmetry, disappointment, secondary procedures and she understood she was little bit at a risk because she was smoking, even though she quit just prior to the surgery.  She understood that did place her in increased risk and delayed reconstruction was certainly an option to be off cigarettes for a longer period of time, but she declined that and wished to proceed.  DESCRIPTION OF PROCEDURE:  The patient was in the operating room and Dr. Derrell Lolling and I completed bilateral mastectomy.  The skin flaps were inspected.  They were all appeared to be very healthy with good capillary refill and  viable.  Wounds were irrigated with saline and the dissection was then performed deep to the pectoralis major muscles Beginning at lateral border of pectoralis muscles. Submuscular space included pectoralis major muscles as well as the serratus muscle laterally.  This was carried down to the inframammary crease preoperatively.  The space having been developed, great care was taken to avoid damage to underlying thoracic cavity. Hemostasis with electrocautery, irrigation with saline, and then irrigation with antibiotic solution.  The expanders were prepared. These were Mentor 550 mL moderate height tissue expanders that were soaked in antibiotic solution.  The air was removed.  30 mL of sterile saline placed using a closed filling system.  It was felt that a further fill was not indicated given her smoking status and to keep pressure off the skin as much as possible.  The expanders were returned to the antibiotic solution and then were soaked in there thoroughly and then antibiotic solution placed in the space.  This space was inspected underneath the muscles.  Excellent hemostasis confirmed.  Expanders positioned and care was taken to make sure there were no wrinkles, ripples and the expanders were flat and muscle then closed over the expanders after again irrigating the expanders with antibiotic solution. Muscle closure with 3-0 Vicryl interrupted figure-of-eight sutures. Great care was taken to avoid underlying expanders, which were kept under  direct vision at all times.  Hemostasis in the subcutaneous space with electrocautery, saline irrigation, antibiotic solution placed.  Two 19-French drains were placed on each side, brought through separate stab wounds inferolaterally, and secured with 3-0 Prolene sutures and then the skin closures with 3-0 and 4-0 Monocryl interrupted inverted deep dermal sutures.  Skin continued to have excellent color and appeared viable right up to the skin  edges.  Biopatches and Tegaderm placed around the drains and Dermabond to seal the wounds, ABDs, and the chest vest applied.  She was transferred to the recovery room stable having tolerated the procedure well.     Etter Sjogren, M.D.     DB/MEDQ  D:  05/12/2011  T:  05/13/2011  Job:  [id number]

## 2011-05-13 NOTE — Progress Notes (Signed)
Subjective: Good pain control. Tolerating fluids. Ambulating. Void without difficulty.  Objective: Vital signs in last 24 hours: Temp:  [97.6 F (36.4 C)-100.3 F (37.9 C)] 100.3 F (37.9 C) (03/21 1047) Pulse Rate:  [74-85] 83  (03/21 1047) Resp:  [12-20] 16  (03/21 1047) BP: (99-137)/(52-79) 99/52 mmHg (03/21 1047) SpO2:  [94 %-100 %] 100 % (03/21 1047) FiO2 (%):  [2 %] 2 % (03/20 2227)  Intake/Output from previous day: 03/20 0701 - 03/21 0700 In: 5699 [P.O.:240; I.V.:5209; IV Piggyback:250] Out: 765 [Urine:115; Drains:330; Blood:200] Intake/Output this shift: Total I/O In: -  Out: 62.5 [Drains:62.5]  Operative sites: Mastectomy flaps Viable. Good color throughout. Tissue expanders in good position. Drains functioning. Drainage Thin.  No results found for this basename: WBC:2,HGB:2,HCT:2,PLATELETS:2,NA:2,K:2,CL:2,CO2:2,BUN:2,CREATININE:2,GLU:2 in the last 72 hours  Studies/Results: Nm Sentinel Node Inj-no Rpt (breast)  05/12/2011  CLINICAL DATA: bilateral breast cancer   Sulfur colloid was injected intradermally by the nuclear medicine  technologist for breast cancer sentinel node localization.      Assessment/Plan: Saline lock IV. PO pain meds. Advance diet.  LOS: 1 day    Donna Black M 05/13/2011 11:43 AM

## 2011-05-13 NOTE — Progress Notes (Signed)
1 Day Post-Op  Subjective: Stable and alert. Foley catheter and out. Up to the bathroom. Able to void. Vital signs look good. No bleeding. No respiratory problems. Complains of incisional pain but analgesics are affected.  Objective: Vital signs in last 24 hours: Temp:  [97.6 F (36.4 C)-99.4 F (37.4 C)] 99.4 F (37.4 C) (03/21 0500) Pulse Rate:  [74-87] 76  (03/21 0500) Resp:  [12-18] 13  (03/21 0631) BP: (104-137)/(52-79) 115/56 mmHg (03/21 0500) SpO2:  [94 %-100 %] 99 % (03/21 0631) FiO2 (%):  [2 %] 2 % (03/20 2227) Last BM Date: 05/11/11  Intake/Output from previous day: 03/20 0701 - 03/21 0700 In: 4587.4 [P.O.:120; I.V.:4217.4; IV Piggyback:250] Out: 765 [Urine:115; Drains:330; Blood:200] Intake/Output this shift: Total I/O In: 734.6 [P.O.:120; I.V.:614.6] Out: 185 [Drains:185]  General appearance: alert. No distress. Spirits are good. Resp: lungs are clear to auscultation bilaterally anteriorly.  Diminished breath sounds at bases. No wheezing or rhonchi. Breasts:  bilateral skin flaps pink, warm and viable. No hematoma or wound bleeding. All drains functioning with thin serosanguineous drainage. No arm swelling.  Lab Results:  No results found for this or any previous visit (from the past 24 hour(s)).   Studies/Results: @RISRSLT24 @     . ARIPiprazole  2.5 mg Oral Daily  .  ceFAZolin (ANCEF) IV  1 g Intravenous Q8H  . docusate sodium  100 mg Oral Daily  . HYDROmorphone  0.5 mg Intravenous Once  . HYDROmorphone  0.5 mg Intravenous Once  . HYDROmorphone PCA 0.3 mg/mL   Intravenous Q4H  . DISCONTD: chlorhexidine  1 application Topical Once  . DISCONTD: chlorhexidine  60 mL Topical Once  . DISCONTD: chlorhexidine  60 mL Topical Once  . DISCONTD: vancomycin  1,000 mg Intravenous 120 min pre-op     Assessment/Plan: s/p Procedure(s): BILATERAL SIMPLE MASTECTOMY WITH BILATERAL AXILLARY SENTINEL NODE BIOPSY BREAST RECONSTRUCTION  POD #1. Stable. Mobilize out of  bed and ambulate. Incentive spitometry Wound care and extent of activity per Dr. Odis Luster. Check pathology.  VTE prophylaxis...SCD's in place. Advise heparin or lovinox if OK with Dr. Odis Luster.  Patient Active Hospital Problem List: No active hospital problems.   LOS: 1 day    Donna Black M. Derrell Lolling, M.D., [facility] General and Minimally invasive Surgery Breast and Colorectal Surgery Office:   [phone] Pager:   [phone]  05/13/2011  . .prob

## 2011-05-14 ENCOUNTER — Encounter (HOSPITAL_COMMUNITY): Payer: Self-pay | Admitting: General Surgery

## 2011-05-14 MED ORDER — CEPHALEXIN 500 MG PO CAPS
500.0000 mg | ORAL_CAPSULE | Freq: Two times a day (BID) | ORAL | Status: DC
  Administered 2011-05-14: 500 mg via ORAL
  Filled 2011-05-14: qty 1

## 2011-05-14 MED ORDER — HYDROMORPHONE HCL 2 MG PO TABS: 2.0000 mg | ORAL_TABLET | ORAL | Status: AC | PRN

## 2011-05-14 MED ORDER — METHOCARBAMOL 500 MG PO TABS: 500.0000 mg | ORAL_TABLET | Freq: Four times a day (QID) | ORAL | Status: AC | PRN

## 2011-05-14 MED ORDER — DSS 100 MG PO CAPS: 100.0000 mg | ORAL_CAPSULE | Freq: Two times a day (BID) | ORAL | Status: AC

## 2011-05-14 MED ORDER — CEPHALEXIN 500 MG PO CAPS: 500.0000 mg | ORAL_CAPSULE | Freq: Two times a day (BID) | ORAL | Status: AC

## 2011-05-14 NOTE — Discharge Summary (Signed)
Physician Discharge Summary  Patient ID: Donna Black MRN: [id number] DOB/AGE: Jan 30, 1966 45 y.o.  Admit date: 05/12/2011 Discharge date: 05/14/2011  Admission Diagnoses: Breast cancer  Discharge Diagnoses: Same  Active Problems:  DCIS (ductal carcinoma in situ) of breast   Discharged Condition: good  Hospital Course: On the day of admission the patient was taken to surgery and had bilateral mastectomy and bilateral reconstruction with tissue expanders. The patient tolerated the procedures well. Postoperatively, the mastectomy flaps maintained excellent color and capillary refill. The patient was ambulatory and tolerating diet on the first postoperative day. Her pain was well controlled on PO pain med on first post-op day. By second day she felt ready for discharge.  Treatments: antibiotics: Ancef, anticoagulation: none and surgery: bilateral mastectomy and reconstruction with tissue expanders.  Discharge Exam: Blood pressure 106/44, pulse 93, temperature 99.3 F (37.4 C), temperature source Oral, resp. rate 16, last menstrual period 05/11/2011, SpO2 100.00%.  Operative sites: Mastectomy flaps viable. Good color throughout all areas. Tissue expanders in good position. Drains functioning. Drainage thin.  Disposition: Discharge home. Follow-up in office in 5 days.  Discharge Orders    Future Appointments: Provider: Department: Dept Phone: Center:   06/02/2011 5:00 PM Ernestene Mention, MD [facility] [phone] None     Medication List  As of 05/14/2011  3:27 PM   TAKE these medications         ARIPiprazole 5 MG tablet   Commonly known as: ABILIFY   Take 2.5 mg by mouth daily.      buPROPion 300 MG 24 hr tablet   Commonly known as: WELLBUTRIN XL   Take 300 mg by mouth daily.      cephALEXin 500 MG capsule   Commonly known as: KEFLEX   Take 1 capsule (500 mg total) by mouth every 12 (twelve) hours.      DSS 100 MG Caps   Take 100 mg by mouth 2 (two) times daily.        HYDROmorphone 2 MG tablet   Commonly known as: DILAUDID   Take 1-2 tablets (2-4 mg total) by mouth every 4 (four) hours as needed for pain.      methocarbamol 500 MG tablet   Commonly known as: ROBAXIN   Take 1 tablet (500 mg total) by mouth every 6 (six) hours as needed.             SignedRossie Muskrat 05/14/2011, 3:27 PM

## 2011-05-14 NOTE — Discharge Instructions (Signed)
No lifting for 6 weeks No vigorous activity for 6 weeks (including outdoor walks) No driving for 4 weeks OK to walk up stairs slowly Stay propped up Use incentive spirometer at home every hour while awake No shower while drains are in place Empty drains at least three times a day and record the amounts separately Change drain dressings every third day if instructed to do so by Dr. Odis Luster (Begin on Saturday March 30)  Apply Bacitracin antibiotic ointment to the drain sites  Place gauze dressing over drains  Secure the gauze with tape Take an over-the-counter Probiotic while on antibiotics Take an over-the-counter stool softener (such as Colace) while on pain medication

## 2011-05-14 NOTE — Progress Notes (Signed)
2 Days Post-Op  Subjective: No issues overnight.  Tolerating diet. Pain meds lasting about 3 hours.  Objective: Vital signs in last 24 hours: Temp:  [99.2 F (37.3 C)-100.7 F (38.2 C)] 99.6 F (37.6 C) (03/22 0639) Pulse Rate:  [83-91] 91  (03/22 0540) Resp:  [16-20] 18  (03/22 0540) BP: (98-113)/(46-54) 105/46 mmHg (03/22 0540) SpO2:  [97 %-100 %] 98 % (03/22 0540) Last BM Date: 05/13/11  Intake/Output from previous day: 03/21 0701 - 03/22 0700 In: 1630 [P.O.:1080; IV Piggyback:50] Out: 219.5 [Drains:219.5] Intake/Output this shift:    General appearance: alert, cooperative and no distress Resp: nonlabored Chest wall: incisions without sign of infection, flaps look good.  drains with appropriate output, thin SS output Cardio: normal rate, regular rhythm  Lab Results:  No results found for this basename: WBC:2,HGB:2,HCT:2,PLT:2 in the last 72 hours BMET No results found for this basename: NA:2,K:2,CL:2,CO2:2,GLUCOSE:2,BUN:2,CREATININE:2,CALCIUM:2 in the last 72 hours PT/INR No results found for this basename: LABPROT:2,INR:2 in the last 72 hours ABG No results found for this basename: PHART:2,PCO2:2,PO2:2,HCO3:2 in the last 72 hours  Studies/Results: Nm Sentinel Node Inj-no Rpt (breast)  05/12/2011  CLINICAL DATA: bilateral breast cancer   Sulfur colloid was injected intradermally by the nuclear medicine  technologist for breast cancer sentinel node localization.      Anti-infectives: Anti-infectives     Start     Dose/Rate Route Frequency Ordered Stop   05/12/11 2200   ceFAZolin (ANCEF) IVPB 1 g/50 mL premix        1 g 100 mL/hr over 30 Minutes Intravenous 3 times per day 05/12/11 1814     05/12/11 1331   polymyxin B 500,000 Units, bacitracin 50,000 Units in sodium chloride irrigation 0.9 % 500 mL irrigation  Status:  Discontinued          As needed 05/12/11 1331 05/12/11 1435   05/12/11 1210   polymyxin B 500,000 Units, bacitracin 50,000 Units in sodium chloride  irrigation 0.9 % 500 mL irrigation  Status:  Discontinued          As needed 05/12/11 1210 05/12/11 1435   05/11/11 1453   vancomycin (VANCOCIN) IVPB 1000 mg/200 mL premix  Status:  Discontinued        1,000 mg 200 mL/hr over 60 Minutes Intravenous 120 min pre-op 05/11/11 1453 05/12/11 1938          Assessment/Plan: s/p Procedure(s) (LRB): SIMPLE MASTECTOMY WITH AXILLARY SENTINEL NODE BIOPSY (Bilateral) BREAST RECONSTRUCTION (Bilateral) she is doing well.  wounds look good.  she had some low grade temps last night but no signs of infection.  she should be okay for discharge when okay with plastics. Follow up next week with Dr. Derrell Lolling  LOS: 2 days    Lodema Pilot DAVID 05/14/2011

## 2011-05-14 NOTE — Progress Notes (Signed)
Discharge home. Home discharge instruction given, no questions verbalized. Alert and oriented, not in any distress. 

## 2011-05-17 ENCOUNTER — Telehealth (INDEPENDENT_AMBULATORY_CARE_PROVIDER_SITE_OTHER): Payer: Self-pay | Admitting: General Surgery

## 2011-05-17 NOTE — Telephone Encounter (Signed)
I spoke with Donna Black on the phone today. I discussed her pathology report the results with her. She stated that she was doing fine. Follow up appointment was arranged.  Angelia Mould. Derrell Lolling, M.D., [facility] General and Minimally invasive Surgery Breast and Colorectal Surgery Office:   [phone] Pager:   [phone]

## 2011-06-02 ENCOUNTER — Encounter (INDEPENDENT_AMBULATORY_CARE_PROVIDER_SITE_OTHER): Payer: Self-pay | Admitting: General Surgery

## 2011-06-02 ENCOUNTER — Ambulatory Visit (INDEPENDENT_AMBULATORY_CARE_PROVIDER_SITE_OTHER): Payer: Commercial Managed Care - PPO | Admitting: General Surgery

## 2011-06-02 VITALS — BP 106/62 | HR 74 | Resp 16 | Ht 64.0 in | Wt 179.0 lb

## 2011-06-02 DIAGNOSIS — D0510 Intraductal carcinoma in situ of unspecified breast: Secondary | ICD-10-CM

## 2011-06-02 DIAGNOSIS — D0590 Unspecified type of carcinoma in situ of unspecified breast: Secondary | ICD-10-CM

## 2011-06-02 NOTE — Patient Instructions (Signed)
Your bilateral mastectomy skin flaps look good. Your arms look good.  Be sure to start your physical therapy class  immediately.  Keep your appointment with Dr. Odis Luster this Friday.  Call Dr. Donnie Coffin or Tami at the cancer Center and make a followup appointment with Dr. Donnie Coffin.  Return to see Dr. Derrell Lolling in 3 months.

## 2011-06-02 NOTE — Progress Notes (Signed)
Subjective:     Patient ID: Donna Black, female   DOB: [birth year], 46 y.o.   MRN: [id number]  HPI  This patient underwent bilateral total mastectomy, bilateral sentinel node biopsy, and reconstruction with tissue expander on May 12, 2011. Dr. Odis Luster is her plastic surgeon.  Final pathology report shows a 4 cm area of ductal carcinoma in situ in the right breast. Nodes are negative. Left breast shows no residual DCIS, although DCIS was identified in preop biopsy. Genetic testing is negative.  She is doing well. Her drains are out. She has been referred to physical therapy on [address] to see Sharl Ma and that is coming up soon. She does not have a followup with Dr. Donnie Coffin yet.  She is doing well. She has no complaints of arm swelling or arm numbness. She is still somewhat sore in the mastectomy sites. She has not had any tobacco since the preop. I congratulated her. Review of Systems     Objective:   Physical Exam Patient looks well. A good spirits.  Bilateral mastectomy skin flaps looked good. No infection. Tender. No hematoma.  Extremities:  no arm numbness or swelling. She can abduct to 90    Assessment:     Bilateral ductal carcinoma in situ, bilateral stage Tis, N0. Receptor analysis is variable, but mostly negative.  Status post bilateral total mastectomy, sentinel node biopsy and tissue expander reconstruction. Doing well 3 weeks postop    Plan:     I encouraged her to initiate physical therapy and to get her arms back to full range of motion as soon as possible.  She will followup with Dr. Odis Luster this Friday  Arrangements will be made for followup appointment with Dr. Donnie Coffin  Return to see me in 3 months, sooner if there are any problems.   Angelia Mould. Derrell Lolling, M.D., [facility] General and Minimally invasive Surgery Breast and Colorectal Surgery Office:   [phone] Pager:   [phone]

## 2011-06-04 ENCOUNTER — Telehealth: Payer: Self-pay | Admitting: Oncology

## 2011-06-04 ENCOUNTER — Telehealth: Payer: Self-pay | Admitting: *Deleted

## 2011-06-04 NOTE — Telephone Encounter (Signed)
message

## 2011-06-04 NOTE — Telephone Encounter (Signed)
S/w pt and confirmed 4/18 appt

## 2011-06-07 ENCOUNTER — Ambulatory Visit: Payer: Commercial Managed Care - PPO | Attending: Plastic Surgery | Admitting: Physical Therapy

## 2011-06-07 ENCOUNTER — Other Ambulatory Visit (HOSPITAL_COMMUNITY): Payer: Self-pay | Admitting: *Deleted

## 2011-06-07 DIAGNOSIS — IMO0001 Reserved for inherently not codable concepts without codable children: Secondary | ICD-10-CM | POA: Insufficient documentation

## 2011-06-07 DIAGNOSIS — F329 Major depressive disorder, single episode, unspecified: Secondary | ICD-10-CM

## 2011-06-07 DIAGNOSIS — R0789 Other chest pain: Secondary | ICD-10-CM | POA: Insufficient documentation

## 2011-06-07 DIAGNOSIS — M24519 Contracture, unspecified shoulder: Secondary | ICD-10-CM | POA: Insufficient documentation

## 2011-06-07 MED ORDER — BUPROPION HCL ER (XL) 300 MG PO TB24: 300.0000 mg | ORAL_TABLET | Freq: Every day | ORAL | Status: DC

## 2011-06-09 ENCOUNTER — Ambulatory Visit: Payer: Commercial Managed Care - PPO

## 2011-06-10 ENCOUNTER — Ambulatory Visit (HOSPITAL_BASED_OUTPATIENT_CLINIC_OR_DEPARTMENT_OTHER): Payer: Commercial Managed Care - PPO | Admitting: Oncology

## 2011-06-10 ENCOUNTER — Ambulatory Visit: Payer: Commercial Managed Care - PPO

## 2011-06-10 VITALS — BP 94/65 | HR 76 | Temp 98.7°F | Ht 64.0 in | Wt 180.5 lb

## 2011-06-10 DIAGNOSIS — D0510 Intraductal carcinoma in situ of unspecified breast: Secondary | ICD-10-CM

## 2011-06-10 DIAGNOSIS — D0590 Unspecified type of carcinoma in situ of unspecified breast: Secondary | ICD-10-CM

## 2011-06-13 NOTE — Progress Notes (Signed)
Hematology and Oncology Follow Up Visit  Donna Black [id number] 03/31/1965 45 y.o. 06/13/2011 11:18 AM PCP  Principle Diagnosis: 46 yo with history of bilateral DCIS status post bilateral mastectomy 05/12/2011, that is post reconstruction with tissue expanders placed.  Interim History:  There have been no intercurrent illness, or medication changes.  Medications: Continuous:    Allergies:  Allergies  Allergen Reactions  . Penicillins Other (See Comments)    Did not work as a child    Past Medical History, Surgical history, Social history, and Family History were reviewed and updated.  Review of Systems: Constitutional:  Negative for fever, chills, night sweats, anorexia, weight loss, pain. Cardiovascular: no chest pain or dyspnea on exertion Respiratory: no cough, shortness of breath, or wheezing Neurological: negative Dermatological: negative ENT: negative Skin Gastrointestinal: negative Genito-Urinary: negative Hematological and Lymphatic: negative Breast: negative Musculoskeletal: Mild discomfort from surgical sites  Remaining ROS negative.  Physical Exam: Blood pressure 94/65, pulse 76, temperature 98.7 F (37.1 C), temperature source Oral, height 5\' 4"  (1.626 m), weight 180 lb 8 oz (81.874 kg). ECOG:  General appearance: alert, cooperative and appears stated age Head: Normocephalic, without obvious abnormality, atraumatic Neck: no adenopathy, no carotid bruit, no JVD, supple, symmetrical, trachea midline and thyroid not enlarged, symmetric, no tenderness/mass/nodules Lymph nodes: Cervical, supraclavicular, and axillary nodes normal. Cardiac : regular rate and rhythm, no murmurs or gallops Pulmonary:clear to auscultation bilaterally and normal percussion bilaterally Breasts: inspection negative, no nipple discharge or bleeding, no masses or nodularity palpable and She is status post mastectomies implants replaced surgical scars are healing well drains in  place Abdomen:soft, non-tender; bowel sounds normal; no masses,  no organomegaly Extremities negative Neuro: alert, oriented, normal speech, no focal findings or movement disorder noted  Lab Results: Lab Results  Component Value Date   WBC 6.3 05/03/2011   HGB 14.4 05/03/2011   HCT 40.6 05/03/2011   MCV 94.4 05/03/2011   PLT 243 05/03/2011     Chemistry      Component Value Date/Time   NA 138 05/03/2011 0854   K 4.4 05/03/2011 0854   CL 104 05/03/2011 0854   CO2 26 05/03/2011 0854   BUN 16 05/03/2011 0854   CREATININE 0.77 05/03/2011 0854      Component Value Date/Time   CALCIUM 9.7 05/03/2011 0854   ALKPHOS 47 05/03/2011 0854   AST 17 05/03/2011 0854   ALT 18 05/03/2011 0854   BILITOT 0.3 05/03/2011 0854      .pathology. Radiological Studies: chest X-ray n/a Mammogram n/a Bone density n/a  Impression and Plan: 46 year old and a was premenopausal presents with bilateral DCIS. She is status post bilateral mastectomies with reconstruction. Genetic testing was negative. It would appear that the phenotype of the DCIS was different with gemcitabine ER/PR negative and the other side being ER and PR positive. Her son had an area of DCIS measuring 4 cm. The left side had no residual DCIS. Given this information we had a discussion about adjuvant tamoxifen. Regardless of the ER and PR phenotype. She is mastectomies. I do not think that giving her adjuvant tamoxifen would benefit her from a risk reduction point of view I think her risk of systemic relapse is essentially 0 or below. Respiratory she underwent not be in her favor. As such I have not recommended adjuvant tamoxifen. I have not scheduled her for any followup here and she will follow up with Dr. Derrell Lolling.  More than 50% of the visit was spent in patient-related counselling  Pierce Crane, MD 4/21/201311:18 AM

## 2011-06-14 ENCOUNTER — Ambulatory Visit (HOSPITAL_COMMUNITY): Payer: Commercial Managed Care - PPO | Admitting: Psychiatry

## 2011-06-14 ENCOUNTER — Encounter: Payer: Commercial Managed Care - PPO | Admitting: Physical Therapy

## 2011-06-15 ENCOUNTER — Encounter (INDEPENDENT_AMBULATORY_CARE_PROVIDER_SITE_OTHER): Payer: Self-pay

## 2011-06-16 ENCOUNTER — Ambulatory Visit: Payer: Commercial Managed Care - PPO

## 2011-06-17 ENCOUNTER — Ambulatory Visit: Payer: Commercial Managed Care - PPO

## 2011-06-21 ENCOUNTER — Ambulatory Visit (INDEPENDENT_AMBULATORY_CARE_PROVIDER_SITE_OTHER): Payer: Commercial Managed Care - PPO | Admitting: Psychiatry

## 2011-06-21 ENCOUNTER — Encounter (HOSPITAL_COMMUNITY): Payer: Self-pay | Admitting: Psychiatry

## 2011-06-21 ENCOUNTER — Ambulatory Visit: Payer: Commercial Managed Care - PPO | Admitting: Physical Therapy

## 2011-06-21 DIAGNOSIS — F329 Major depressive disorder, single episode, unspecified: Secondary | ICD-10-CM

## 2011-06-21 MED ORDER — ARIPIPRAZOLE 5 MG PO TABS: ORAL_TABLET | ORAL | Status: DC

## 2011-06-21 NOTE — Progress Notes (Signed)
Chief complaint I had surgery in March but I'm doing better now.    History present illness Patient is 46 year old Caucasian married female who came for her followup ointment.  Patient was diagnosed with breast cancer in January and in March she has bilateral mastectomy.  Patient endorse it was a difficult time however she handle the situation very well.  She is recovering from surgery and doing much better.  Her sleep pattern is also doing very well.  She does not require pain medication every night.  She endorse March was a difficult month but she had a good support from her husband.  She is relieved the surgery went very well.  She does not have to take any chemotherapy or radiation.  She feels her current psychiatric medication is working very well .  In the past we were hoping that he can stop the Abilify however due to the recent stress in her life we agreed that we will continue Abilify for another 2 months.  She is taking 2.5 mg daily.  She denies any agitation anger or mood swings however she continues to have residual symptoms of depression and crying spells.  Patient denies any hallucination , psychosis or any active or passive suicidal thinking.  Current psychiatric medication Abilify 2.5 mg daily Wellbutrin XL 300 mg daily   Medical history Patient has history of bilateral mastectomy, thyroid disease, anemia and heart murmur.  Mental status examination Patient is casually dressed and well-groomed.  She is pleasant cooperative and maintained good eye contact.  She was tearful when she was talking about her cancer however later she was cooperative and relevant in conversation.  Her speech is soft clear and coherent.  Her thought process logical linear and goal-directed.  She described her mood is anxious and her affect is mood congruent.  She denies any active or passive suicidal thinking and homicidal thinking.  She denies any auditory or visual hallucination.  Her attention and  concentration is fair.  She's alert and oriented x3.  Her insight judgment and impulse control is okay.  Diagnosis Axis I Major depressive disorder Axis II deferred Axis III rash but no medication Axis IV mild to moderate Axis V 65-70  Plan I will continue Abilify at 2.5 mg Wellbutrin XL 300 mg daily.  At this time patient still has some residual symptoms of depression.  I will see her again in 3 months at that time we will consider taking her off from Abilify.  I recommended to call us if she has any question or concern about the medication otherwise I will see her again in 8 weeks.

## 2011-06-24 ENCOUNTER — Encounter: Payer: Commercial Managed Care - PPO | Admitting: Physical Therapy

## 2011-06-28 ENCOUNTER — Encounter: Payer: Commercial Managed Care - PPO | Admitting: Physical Therapy

## 2011-07-01 ENCOUNTER — Encounter: Payer: Commercial Managed Care - PPO | Admitting: Physical Therapy

## 2011-07-05 ENCOUNTER — Encounter: Payer: Commercial Managed Care - PPO | Admitting: Physical Therapy

## 2011-07-08 ENCOUNTER — Encounter: Payer: Commercial Managed Care - PPO | Admitting: Physical Therapy

## 2011-07-12 ENCOUNTER — Encounter: Payer: Commercial Managed Care - PPO | Admitting: Physical Therapy

## 2011-07-15 ENCOUNTER — Encounter: Payer: Commercial Managed Care - PPO | Admitting: Physical Therapy

## 2011-07-22 ENCOUNTER — Encounter: Payer: Commercial Managed Care - PPO | Admitting: Physical Therapy

## 2011-08-23 ENCOUNTER — Ambulatory Visit (HOSPITAL_COMMUNITY): Payer: Commercial Managed Care - PPO | Admitting: Psychiatry

## 2011-08-25 ENCOUNTER — Encounter (HOSPITAL_COMMUNITY): Payer: Self-pay | Admitting: Psychiatry

## 2011-08-25 ENCOUNTER — Ambulatory Visit (INDEPENDENT_AMBULATORY_CARE_PROVIDER_SITE_OTHER): Payer: Commercial Managed Care - PPO | Admitting: Psychiatry

## 2011-08-25 DIAGNOSIS — F329 Major depressive disorder, single episode, unspecified: Secondary | ICD-10-CM

## 2011-08-25 MED ORDER — BUPROPION HCL ER (XL) 300 MG PO TB24: 300.0000 mg | ORAL_TABLET | Freq: Every day | ORAL | Status: DC

## 2011-08-25 NOTE — Progress Notes (Signed)
Chief complaint My mother passed away 2 weeks ago.    History present illness Patient is 46 year old Caucasian married female who came for her followup ointment.  Patient told that her mother who was suffering from stage IV COPD died 2 weeks ago .  Initially she was very upset and cry for 2 days but now she is feeling better.  She has a good support from her family and her daughter.  She endorse that she is a strong person with good faith .  She admitted that she missed her mother but she also realize that she was very sick.  She's compliant with the medication and reported no side effects.  She likes Wellbutrin and Abilify .  She denies any active or passive suicidal thoughts or homicidal thoughts.  She is scheduled to see her doctor for reconstructive breast procedure.  She may get 2 more injection for reconstruction of her breast.  She sleeping better however endorse some time crying spells due to the loss of her mother.  She does not want any grief counseling and believe it is a natural process.  She denies any agitation anger mood swing.  She wants to continue her current psychiatric medication.    Current psychiatric medication Abilify 2.5 mg daily Wellbutrin XL 300 mg daily   Past psychiatric history Patient has been seeing in this office since 2009.  She was admitted at [facility] due to suicidal attempt when she took overdose on her medication.  This is the only psychiatric inpatient treatment.  She has been fairly stable on her medication.  She was taking Abilify 10 mg and Prozac 40 mg which has been reduced gradually.  She is taking Wellbutrin and Prozac has been discontinued few months ago.  Medical history Patient has history of bilateral mastectomy, thyroid disease, anemia and heart murmur.  Mental status examination Patient is casually dressed and well-groomed.  She is tearful about the loss of her mother.  However she is cooperative and maintained good eye  contact.  She appears emotional .  Her speech is soft clear and coherent.  Her thought process logical linear and goal-directed.  She described her mood is anxious and her affect is mood congruent.  She denies any active or passive suicidal thinking and homicidal thinking.  She denies any auditory or visual hallucination.  Her attention and concentration is fair.  She's alert and oriented x3.  Her insight judgment and impulse control is okay.  Diagnosis Axis I Major depressive disorder Axis II deferred Axis III rash but no medication Axis IV mild to moderate Axis V 65-70  Plan Reassurance given and recommend to see therapist for grief counseling however patient declined.  She had a good support from her family.  I will continue Abilify at 2.5 mg Wellbutrin XL 300 mg daily. patient does not want to increase her medication at this time.  She is fairly stable .  I recommend to call us if she is any question or concern about the medication or if she feels worsening of the symptoms.  I will see her again in 3 months.    Portion of this note is generated with voice recognition software and may contain typographical error.

## 2011-10-05 ENCOUNTER — Other Ambulatory Visit (HOSPITAL_COMMUNITY): Payer: Self-pay | Admitting: Psychiatry

## 2011-10-05 DIAGNOSIS — F329 Major depressive disorder, single episode, unspecified: Secondary | ICD-10-CM

## 2011-10-27 ENCOUNTER — Encounter (HOSPITAL_COMMUNITY): Payer: Self-pay | Admitting: Psychiatry

## 2011-10-27 ENCOUNTER — Ambulatory Visit (INDEPENDENT_AMBULATORY_CARE_PROVIDER_SITE_OTHER): Payer: Commercial Managed Care - PPO | Admitting: Psychiatry

## 2011-10-27 VITALS — BP 125/75 | HR 80 | Wt 184.0 lb

## 2011-10-27 DIAGNOSIS — F329 Major depressive disorder, single episode, unspecified: Secondary | ICD-10-CM

## 2011-10-27 MED ORDER — BUPROPION HCL ER (XL) 300 MG PO TB24: 300.0000 mg | ORAL_TABLET | Freq: Every day | ORAL | Status: DC

## 2011-10-27 MED ORDER — ARIPIPRAZOLE 5 MG PO TABS: ORAL_TABLET | ORAL | Status: DC

## 2011-10-27 NOTE — Progress Notes (Signed)
Chief complaint I am doing better.      History present illness Patient is 46 year old Caucasian married female who came for her followup ointment.  On her last visit patient was very sad because her mother died 2 weeks prior to her last visit.  Patient is handling her loss.  She visited the grave yard with her father.  It was difficult however patient had a very well.  Patient continued to have some anxiety and depressive thoughts however she is keeping herself very busy.  Recently she is painting her son's bedroom.  She also joined a book club and has been very busy .  She was concern about her father who had a heart attack 2 weeks ago but he is doing better.  She likes to continue her current psychiatric medication.  She admitted some time not sleeping well however she believes it is do to chest pain .  Patient has reconstructive breast surgery and she is in the process of more procedures.  Overall she feel that the Abilify and Wellbutrin working very well.  She denies any education anger mood swing.  She's been more involved in daily life.  He denies agitation anger mood swing.  She is more social .  She's not using any illegal substance however admitted drinking alcohol on occasion but denies any intoxication or binge drinking.  Patient has not seen her primary care physician.  She like to schedule an appointment for annual checkup and blood work.  Current psychiatric medication Abilify 2.5 mg daily Wellbutrin XL 300 mg daily   Past psychiatric history Patient has been seeing in this office since 2009.  She was admitted at [facility] due to suicidal attempt when she took overdose on her medication.  This is the only psychiatric inpatient treatment.  She was given Prozac which is to stop gradually.    Medical history Patient has history of bilateral mastectomy, thyroid disease, anemia and heart murmur. She see PCP at [facility].   Mental status examination Patient  is casually dressed and well-groomed.  She is anxious but cooperative.  Her speech is clear and coherent.  She described her mood is emotional and her affect is mood appropriate.  Her thought process logical linear and goal-directed.   She denies any active or passive suicidal thinking and homicidal thinking.  She denies any auditory or visual hallucination.  Her attention and concentration is fair.  She's alert and oriented x3.  Her insight judgment and impulse control is okay.  Diagnosis Axis I Major depressive disorder Axis II deferred Axis III rash but no medication Axis IV mild to moderate Axis V 65-70  Plan Discuss in detail about her symptoms and long term prognosis.  At this time patient is fairly stable on her current psychiatric medication.  I encourage her to see her primary care physician for annual checkup and working toward work.  I will continue her current psychiatric medication.  I explained risks and benefits of medication and recommend to call us if she is any question or concern if she feels worsening of the symptom.  I will see her again in 3 months.  Time spent 30 minutes.  Portion of this note is generated with voice recognition software and may contain typographical error.

## 2012-01-26 ENCOUNTER — Ambulatory Visit (INDEPENDENT_AMBULATORY_CARE_PROVIDER_SITE_OTHER): Payer: Commercial Managed Care - PPO | Admitting: Psychiatry

## 2012-01-26 ENCOUNTER — Encounter (HOSPITAL_COMMUNITY): Payer: Self-pay | Admitting: Psychiatry

## 2012-01-26 DIAGNOSIS — F329 Major depressive disorder, single episode, unspecified: Secondary | ICD-10-CM

## 2012-01-26 MED ORDER — ARIPIPRAZOLE 5 MG PO TABS: ORAL_TABLET | ORAL | Status: DC

## 2012-01-26 MED ORDER — BUPROPION HCL ER (XL) 300 MG PO TB24: 300.0000 mg | ORAL_TABLET | Freq: Every day | ORAL | Status: DC

## 2012-01-26 NOTE — Progress Notes (Signed)
Patient ID: Donna Black, female   DOB: 1965-12-20, 46 y.o.   MRN: [id number] Chief complaint Medication management and followup.     History present illness Patient is 46 year old Caucasian married female who came for her followup appointment.  Patient had a good Thanksgiving.  She was somewhat annoyed by her her husband's family who came from [place].  However it went well.  Overall patient is doing better on her medication.  She has some uppers between depression and given cough medication.  She is feeling better now.  She has some insomnia which she believed due to husband snores and her 46 year old son sometime comes on her bed.  Patient denies any hallucination, agitation, crying spells or any paranoid thinking.  She is on a diet and hoping to lose some weight .  She is tolerating her medication and denies any side effects.  There were no tremors or shakes present.  She denies any agitation or any mood swings.  She scheduled to have and we'll physical and blood drawn on December 30.  Her primary care physician is Dr. Zollie Beckers at [facility] physician.    Current psychiatric medication Abilify 2.5 mg daily Wellbutrin XL 300 mg daily   Past psychiatric history Patient has been seeing in this office since 2009.  She was admitted at [facility] due to suicidal attempt when she took overdose on her medication.  This is the only psychiatric inpatient treatment.  She was given Prozac which is to stop gradually.    Medical history Patient has history of bilateral mastectomy, thyroid disease, anemia and heart murmur. She see PCP at [facility].   Review of Systems  Constitutional: Negative.   HENT: Negative.   Musculoskeletal: Negative.   Neurological: Negative.   Psychiatric/Behavioral: The patient has insomnia.    Mental status examination Patient is casually dressed and well-groomed.  She is anxious but cooperative.  Her speech is clear and coherent.  She described her  mood is emotional and her affect is mood appropriate.  Her thought process logical linear and goal-directed.   There were no flight of idea or loose association.  Her fund of knowledge is adequate.  She denies any active or passive suicidal thinking and homicidal thinking.  She denies any auditory or visual hallucination.  Her attention and concentration is fair.  She's alert and oriented x3.  Her insight judgment and impulse control is okay.  Diagnosis Axis I Major depressive disorder Axis II deferred Axis III rash but no medication Axis IV mild to moderate Axis V 65-70  Plan I will continue her current psychiatric medication.  I recommend to use melatonin over-the-counter to help her insomnia as I believe it is situational.  Sleep hygiene given.  I will continue her Wellbutrin and Abilify.  Patient at this time does not have any side effects.  I recommend to call us if she is any question or concern about the medication otherwise he'll see her again in 3 months.  Portion of this note is generated with voice recognition software and may contain typographical error.

## 2012-03-03 ENCOUNTER — Telehealth (HOSPITAL_COMMUNITY): Payer: Self-pay

## 2012-03-07 ENCOUNTER — Telehealth (HOSPITAL_COMMUNITY): Payer: Self-pay | Admitting: *Deleted

## 2012-03-07 NOTE — Telephone Encounter (Signed)
Message copied by Tonny Bollman on Tue Mar 07, 2012  3:58 PM ------      Message from: Kathryne Sharper T      Created: Tue Mar 07, 2012  9:18 AM       Can you ask patient what diet pills her PCP recommended?      Thanks

## 2012-03-07 NOTE — Telephone Encounter (Signed)
Pt called office, message taken by secretary on  03/03/2012@ 1:58 PM Phone (Incoming) She wants to know if she can take a diet pill along with her anti depressants because she and her PCP feel she has gained too much weight. Her PCP is going to send you a letter regarding this. Contacted pt at Dr.Arfeen's request regarding medication.Pt states she does not know which medicine PCP (Dr.Walters) was going to give her.She thought PCP would discus with Dr.Arfeen after he received letter. Pt states she and PCP had discussed that losing weight would be good for her, and PCP was going to send a letter to Dr.Arfeen. Informed pt that at this point, no letter was received. She stated she would contact PCP's office. Informed pt that PCP could also fax letter.

## 2012-03-10 ENCOUNTER — Encounter (HOSPITAL_COMMUNITY): Payer: Self-pay | Admitting: Psychiatry

## 2012-03-10 ENCOUNTER — Ambulatory Visit (INDEPENDENT_AMBULATORY_CARE_PROVIDER_SITE_OTHER): Payer: Commercial Managed Care - PPO | Admitting: Psychiatry

## 2012-03-10 DIAGNOSIS — F329 Major depressive disorder, single episode, unspecified: Secondary | ICD-10-CM

## 2012-03-10 NOTE — Progress Notes (Signed)
Patient ID: Donna Black, female   DOB: 11/27/1965, 47 y.o.   MRN: [id number] Chief complaint I need to talk to you.  There is something bothering me.     History present illness Patient is 47 year old Caucasian married female who came earlier than her scheduled appointment.  Patient continued experiencing grief about the loss off her mother.  Her mother died last year in 2022-08-29.  Lately patient is thinking a lot about her past.  Since she lost her mother she is feeling more anxious about her children.  Today she came in and had few questions , she felt that she had a suicidal attempt in 2009 was a big mistake because if she had completed a suicide her children will be feeling the same way that she is feeling today .  She wants to know what triggered her to cause some of depression that she wanted take her own life.  I review her chart and the circumstances that led hospitalization in 2009.  Patient admitted at that time she has significant marital issues and she was also taking Lunesta to help sleep which also cause increased paranoia .  However patient also had family history of depression.  She endorse mother has significant depression.  I explain in detail about her genetic length, psychosocial stressors, and may be medication side effects that trigger a significant psychiatric episode in which she tried to kill herself.  Patient is very worried about her future suicidal thinking , she does not want to do anything to harm her children.  She gets very emotional and that he regret that she has done suicidal attempt in the past.  She feels that she is in her right medication which is preventing her to even think about suicide.  I have a long discussion with her about the symptoms, course, contributing factors , management and prognosis of her illness.  Patient endorse that she's been doing very well despite sometimes she thinks about the past.  She is busy in house renovation , taking care of her children and  recently trying to lose weight.  Patient also saw her primary care physician and find out that she has high cholesterol.  She is now very health conscious and trying to lose weight.  She also asked many questions about her weight loss and any medication that can help her.  She was recommended by her primary care physician to take the medication however her primary care physician wants to consult this writer for starting any medication.  Patient told that her primary care physician wants to try weight loss medication but due to the history of psychiatric illness she want to consult this Clinical research associate.  I explained to the patient that once I received a letter from primary care physician I will give her call about the pros and cons given the history of psychiatric illness.  Patient is compliant with the medication.  She denies any side effects.  She sleeping better.  She's somewhat emotional but overall she's stable.    Current psychiatric medication Abilify 2.5 mg daily Wellbutrin XL 300 mg daily   Past psychiatric history Patient has been seeing in this office since 2009.  She was admitted at [facility] due to suicidal attempt when she took overdose on her medication.  This is the only psychiatric inpatient treatment.  She was given Prozac which is to stop gradually.    Medical history Patient has history of bilateral mastectomy, thyroid disease, anemia and heart murmur.  She see PCP at [facility].   Review of Systems  Constitutional: Negative.   HENT: Negative.   Musculoskeletal: Negative.   Neurological: Negative.   Psychiatric/Behavioral: The patient is nervous/anxious.    Mental status examination Patient is casually dressed and well-groomed.  She is anxious but cooperative.  Her speech is clear and coherent.  She described her mood is emotional and her affect is mood appropriate.  Her thought process logical linear and goal-directed.   There were no flight of idea or  loose association.  Her fund of knowledge is adequate.  She denies any active or passive suicidal thinking and homicidal thinking.  She denies any auditory or visual hallucination.  Her attention and concentration is fair.  She's alert and oriented x3.  Her insight judgment and impulse control is okay.  Diagnosis Axis I Major depressive disorder Axis II deferred Axis III rash but no medication Axis IV mild to moderate Axis V 65-70  Plan I discussed in detail about her symptoms, medication response and side effects and prognosis.  Reassurance given.  I also recommend that if she feel that her past history is making her overwhelmed and she continued to feel guilt about her past suicidal attempt that she should see a therapist for coping skills.  Patient will let us know however after today's meeting she feeling better.  I recommend to call us if she is any question or concern or if she feels worsening of the symptoms.  She will continue her current psychiatric medication.  She scheduled to see me in March.  She has enough medication.  Time spent 25 minutes.  Once she deceive a letter from primary care physician about her weight loss medication I will call the patient for more details.  Portion of this note is generated with voice recognition software and may contain typographical error.

## 2012-04-25 ENCOUNTER — Ambulatory Visit (HOSPITAL_COMMUNITY): Payer: Self-pay | Admitting: Psychiatry

## 2012-04-26 ENCOUNTER — Encounter (HOSPITAL_COMMUNITY): Payer: Self-pay | Admitting: Psychiatry

## 2012-04-26 ENCOUNTER — Ambulatory Visit (INDEPENDENT_AMBULATORY_CARE_PROVIDER_SITE_OTHER): Payer: Commercial Managed Care - PPO | Admitting: Psychiatry

## 2012-04-26 VITALS — BP 115/84 | HR 100 | Wt 181.2 lb

## 2012-04-26 DIAGNOSIS — F329 Major depressive disorder, single episode, unspecified: Secondary | ICD-10-CM

## 2012-04-26 MED ORDER — BUPROPION HCL ER (XL) 300 MG PO TB24: 300.0000 mg | ORAL_TABLET | Freq: Every day | ORAL | Status: DC

## 2012-04-26 NOTE — Progress Notes (Signed)
[facility] [id number] Progress Note  Donna Black [id number] 47 y.o.  04/26/2012 2:23 PM  Chief Complaint: I wanted to come off from Abilify.  History of Present Illness: Patient is 47 year old Caucasian married female who came for her followup appointment.  Patient was last seen on January 17.  At that time patient has a lot of question about her psychiatric illness and it was answer and patient was released.  Patient is doing overall better.  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  She explain about the business that she has to display about her product in the parties and she is excited that she is going to display at her best friend's house.  She feel that she does not need Abilify.  She is doing much better.  She is occupied with her business but also feels much improvement in her attention concentration and depression.  Recently she admitted feeling tired because she is taking care of her daughter`s cat who is out of the town.  She's also very happy that her daughter is getting married next year.  She admitted some time tearfulness and misses her mom but overall she denies any active or passive suicidal thoughts or homicidal thoughts.  She feel Wellbutrin alone can help her depression and does not need Abilify.  She does not have any psychotic symptoms at this time.  She do not have hallucination or any paranoid thinking.  We have talked in detail on the last visit about the triggers that can cause him to significant depression.  Patient is working on those triggers and feel comfortable with only Wellbutrin.  We also reviewed her records from her primary care physician which was sent yesterday.  She is seeing Dr. Johnn Hai .  Her basic S. she was normal however her close to call was to 55 but her portion test and kidney patient has her normal.  She's not taking any other medication.  She is concerned about her weight and recently working on her diet and  exercise.  Suicidal Ideation: No Plan Formed: No Patient has means to carry out plan: No  Homicidal Ideation: No Plan Formed: No Patient has means to carry out plan: No  Review of Systems: Psychiatric: Agitation: No Hallucination: No Depressed Mood: No Insomnia: No Hypersomnia: No Altered Concentration: No Feels Worthless: No Grandiose Ideas: No Belief In Special Powers: No New/Increased Substance Abuse: No Compulsions: No  Neurologic: Headache: No Seizure: No Paresthesias: No  Past psychiatric history. Patient has been seeing in this office since 2009.  She was admitted at [facility] due to suicidal attempt when she took overdose on her medication.  At that time she was taking Prozac which was stopped.    Medical history . Patient has history of bilateral mastectomy, hypothyroidism , anemia and heart murmur.  She see Dr. Johnn Hai at [facility].  Her recent blood work shows hyperlipidemia.    History: Patient lives with her husband.  Outpatient Encounter Prescriptions as of 04/26/2012  Medication Sig Dispense Refill  . ARIPiprazole (ABILIFY) 5 MG tablet Take 1/2 tab daily  45 tablet  0  . buPROPion (WELLBUTRIN XL) 300 MG 24 hr tablet Take 1 tablet (300 mg total) by mouth daily.  90 tablet  0  . [DISCONTINUED] buPROPion (WELLBUTRIN XL) 300 MG 24 hr tablet Take 1 tablet (300 mg total) by mouth daily.  90 tablet  0   No facility-administered encounter medications on file as  of 04/26/2012.    Past Psychiatric History/Hospitalization(s): Anxiety: Yes Bipolar Disorder: No Depression: Yes Mania: No Psychosis: Yes Schizophrenia: No Personality Disorder: No Hospitalization for psychiatric illness: Yes History of Electroconvulsive Shock Therapy: No Prior Suicide Attempts: Yes  Physical Exam: Constitutional:  BP 115/84  Pulse 100  Wt 181 lb 3.2 oz (82.192 kg)  BMI 31.09 kg/m2  General Appearance: alert, oriented, no acute distress, well  nourished and obese patient is emotional.  Musculoskeletal: Strength & Muscle Tone: within normal limits Gait & Station: normal Patient leans: N/A  Psychiatric: Speech (describe rate, volume, coherence, spontaneity, and abnormalities if any): Clear and coherent.  Thought Process (describe rate, content, abstract reasoning, and computation): Logical and goal-directed.  Associations: Coherent, Relevant and Intact  Thoughts: normal  Mental Status: Orientation: oriented to person, place, time/date and situation Mood & Affect: Anxious with appropriate affect Attention Span & Concentration: Fair  Medical Decision Making (Choose Three): Established Problem, Stable/Improving (1), Review of Psycho-Social Stressors (1), Review or order clinical lab tests (1), Review and summation of old records (2), Review of Medication Regimen & Side Effects (2) and Review of New Medication or Change in Dosage (2)  Assessment: Axis I: Maj. depressive disorder  Axis II: Deferred  Axis III: See medical history  Axis IV: Mild  Axis V: 65-70   Plan: I discuss the symptoms, medication and response to the medication in detail.  Patient like to stop Abilify.  She just started a new business and explained that it could be overwhelming but actually patient feeling better since she is thinking about the business.  I will discontinue Abilify and recommend to call us if she started to feel depressed again.  I will continue Wellbutrin XL 300 mg.  I recommend to call us back if she started feeling worsening of the symptom.  I will see her again in 4 weeks.  Time spent 30 minutes.  More than 50% of the time spent and psychoeducation counseling and coordination of care.   ARFEEN,SYED T., MD 04/26/2012

## 2012-05-24 ENCOUNTER — Ambulatory Visit (INDEPENDENT_AMBULATORY_CARE_PROVIDER_SITE_OTHER): Payer: Commercial Managed Care - PPO | Admitting: Psychiatry

## 2012-05-24 ENCOUNTER — Encounter (HOSPITAL_COMMUNITY): Payer: Self-pay | Admitting: Psychiatry

## 2012-05-24 VITALS — BP 114/77 | HR 89 | Wt 180.2 lb

## 2012-05-24 DIAGNOSIS — F329 Major depressive disorder, single episode, unspecified: Secondary | ICD-10-CM

## 2012-05-24 NOTE — Progress Notes (Signed)
[facility] [id number] Progress Note  Donna Black [id number] 47 y.o.  05/24/2012 10:33 AM  Chief Complaint:  I am doing better .    History of Present Illness: Patient is 47 year old Caucasian married female who came for her followup appointment.  She is not taking Abilify which was stopped on her last visit.  Last week she was sad because of her mother's birthday .  However she is feeling much better for past few days.  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  She is looking for a permanent job .  Her daughter is getting married and she is very excited.  She sleeping better.  She denies any irritability anger or any mood swing.  She denies any agitation or any paranoia.  She likes Wellbutrin which she takes every day.  There were no tremors or shakes.  She wants to continue Wellbutrin.  Her attention and concentration is better.  She is seeing Dr. Johnn Hai .    Suicidal Ideation: No Plan Formed: No Patient has means to carry out plan: No  Homicidal Ideation: No Plan Formed: No Patient has means to carry out plan: No  Review of Systems: Psychiatric: Agitation: No Hallucination: No Depressed Mood: No Insomnia: No Hypersomnia: No Altered Concentration: No Feels Worthless: No Grandiose Ideas: No Belief In Special Powers: No New/Increased Substance Abuse: No Compulsions: No  Neurologic: Headache: No Seizure: No Paresthesias: No  Past psychiatric history. Patient has been seeing in this office since 2009.  She was admitted at [facility] due to suicidal attempt when she took overdose on her medication.  At that time she was taking Prozac which was stopped.    Medical history . Patient has history of bilateral mastectomy, hypothyroidism , anemia and heart murmur.  She see Dr. Johnn Hai at [facility] physician.  Her recent blood work shows hyperlipidemia.     History: Patient lives with her husband.  Outpatient Encounter Prescriptions as of 05/24/2012  Medication Sig Dispense Refill  . buPROPion (WELLBUTRIN XL) 300 MG 24 hr tablet Take 1 tablet (300 mg total) by mouth daily.  90 tablet  0  . [DISCONTINUED] ARIPiprazole (ABILIFY) 5 MG tablet Take 1/2 tab daily  45 tablet  0   No facility-administered encounter medications on file as of 05/24/2012.    Past Psychiatric History/Hospitalization(s): Anxiety: Yes Bipolar Disorder: No Depression: Yes Mania: No Psychosis: Yes Schizophrenia: No Personality Disorder: No Hospitalization for psychiatric illness: Yes History of Electroconvulsive Shock Therapy: No Prior Suicide Attempts: Yes  Physical Exam: Constitutional:  BP 114/77  Pulse 89  Wt 180 lb 3.2 oz (81.738 kg)  BMI 30.92 kg/m2  General Appearance: alert, oriented, no acute distress, well nourished and obese patient is emotional.  Musculoskeletal: Strength & Muscle Tone: within normal limits Gait & Station: normal Patient leans: N/A  Psychiatric: Speech (describe rate, volume, coherence, spontaneity, and abnormalities if any): Clear and coherent.  Thought Process (describe rate, content, abstract reasoning, and computation): Logical and goal-directed.  Associations: Coherent, Relevant and Intact  Thoughts: normal  Mental Status: Orientation: oriented to person, place, time/date and situation Mood & Affect: Anxious with appropriate affect Attention Span & Concentration: Fair  Medical Decision Making (Choose Three): Established Problem, Stable/Improving (1), Review of Last Therapy Session (1) and Review of Medication Regimen & Side Effects (2)  Assessment: Axis I: Maj. depressive disorder  Axis II: Deferred  Axis III:  See medical history  Axis IV: Mild  Axis V: 65-70   Plan: I will continue Wellbutrin XL 300 mg daily.  Patient still has refill remaining for 2 more months.  I will see her again in 8 weeks .   Recommend to call us back if she is any question or concern if she feels worsening of the symptom.    [name], MD 05/24/2012

## 2012-06-13 ENCOUNTER — Ambulatory Visit (INDEPENDENT_AMBULATORY_CARE_PROVIDER_SITE_OTHER): Payer: Commercial Managed Care - PPO | Admitting: Psychiatry

## 2012-06-13 ENCOUNTER — Encounter (HOSPITAL_COMMUNITY): Payer: Self-pay | Admitting: Psychiatry

## 2012-06-13 VITALS — BP 127/65 | HR 113 | Wt 175.2 lb

## 2012-06-13 DIAGNOSIS — F331 Major depressive disorder, recurrent, moderate: Secondary | ICD-10-CM

## 2012-06-13 DIAGNOSIS — F329 Major depressive disorder, single episode, unspecified: Secondary | ICD-10-CM

## 2012-06-13 MED ORDER — ARIPIPRAZOLE 10 MG PO TABS: 10.0000 mg | ORAL_TABLET | Freq: Every day | ORAL | Status: DC

## 2012-06-13 NOTE — Progress Notes (Signed)
[facility] [id number] Progress Note  Donna Black [id number] 47 y.o.  06/13/2012 2:21 PM  Chief Complaint:  I have a confession to make.  I stop taking Abilify long before my last visit.  I'm not doing very well.  I don't trust on my husband.    History of Present Illness: Patient is 47 year old Caucasian married female who came for her followup appointment with her husband.  Patient admitted not taking Abilify for a long time and only mention on her last visit that she does not require Abilify .  Patient admitted feeling paranoid delusional and difficulty trusting on her husband.  However she also felt that some of her thinking her nondelusional.  She mentioned to her husband about her noncompliance with Abilify 3 weeks ago , and since then she has noticed that her husband is playing games with her.  She felt that he is getting messages from television , showing her naked pictures to other people.  She feels TV is talking about her.  I spoke to her husband who mentioned the patient has been more irritable agitated and not to herself in the past few weeks.  She is self-neglect and unable to take care of children.  She's not sleeping very well.  Patient admitted all above symptoms and decided to take her Abilify 5 mg for past 4 days.  She admitted to crying spells, irritability and having hallucination but denies any active or passive suicidal thoughts or homicidal thoughts.  She is willing to increase her Abilify to 10 mg.  She is seeing therapists Yehuda Mao  she admitted to taking Abilify 10 mg and a stop her husband doing bad things then she will take Abilify 10 mg.  In the past she has taken Abilify with good response.  Suicidal Ideation: No Plan Formed: No Patient has means to carry out plan: No  Homicidal Ideation: No Plan Formed: No Patient has means to carry out plan: No  Review of Systems: Psychiatric: Agitation: No Hallucination: Yes Depressed Mood: No Insomnia:  Yes Hypersomnia: No Altered Concentration: Yes Feels Worthless: No Grandiose Ideas: No Belief In Special Powers: Belief TV is talking about her. New/Increased Substance Abuse: No Compulsions: No  Neurologic: Headache: No Seizure: No Paresthesias: No  Past psychiatric history. Patient has been seeing in this office since 2009.  She was admitted at [facility] due to suicidal attempt when she took overdose on her medication.  At that time she was taking Prozac which was stopped.    Medical history . Patient has history of bilateral mastectomy, hypothyroidism , anemia and heart murmur.  She see Dr. Johnn Hai at [facility] physician.  Her recent blood work shows hyperlipidemia.    History: Patient lives with her husband.  Outpatient Encounter Prescriptions as of 06/13/2012  Medication Sig Dispense Refill  . buPROPion (WELLBUTRIN XL) 300 MG 24 hr tablet Take 1 tablet (300 mg total) by mouth daily.  90 tablet  0  . ARIPiprazole (ABILIFY) 10 MG tablet Take 1 tablet (10 mg total) by mouth daily.  30 tablet  0   No facility-administered encounter medications on file as of 06/13/2012.    Past Psychiatric History/Hospitalization(s): Anxiety: Yes Bipolar Disorder: No Depression: Yes Mania: No Psychosis: Yes Schizophrenia: No Personality Disorder: No Hospitalization for psychiatric illness: Yes History of Electroconvulsive Shock Therapy: No Prior Suicide Attempts: Yes  Physical Exam: Constitutional:  BP 127/65  Pulse 113  Wt 175 lb 3.2 oz (79.47 kg)  BMI 30.06 kg/m2  General Appearance: well nourished patient is emotional.  Musculoskeletal: Strength & Muscle Tone: within normal limits Gait & Station: normal Patient leans: N/A  Psychiatric: Speech (describe rate, volume, coherence, spontaneity, and abnormalities if any): Clear and coherent.  Thought Process (describe rate, content, abstract reasoning, and computation): Logical and  goal-directed.  Associations: Coherent, Irrelevant, Loose and Paranoid and delusional.  Thoughts: normal  Mental Status: Orientation: oriented to person, place, time/date and situation Mood & Affect: anxiety, elevated affect and labile affect Attention Span & Concentration: Fair  Medical Decision Making (Choose Three): Established Problem, Worsening (2), Review of Last Therapy Session (1), Review of Medication Regimen & Side Effects (2) and Review of New Medication or Change in Dosage (2)  Assessment: Axis I: Maj. depressive disorder  Axis II: Deferred  Axis III: See medical history  Axis IV: Mild  Axis V: 65-70   Plan: I believe patient has been decompensating since she is not taking Abilify.  She confesses that she has not taken Abilify for a long time .  I spoke to her husband in detail.  There is definitely decompensation and past few weeks , patient is more delusional paranoid irritable and having insomnia.  I recommend to take Abilify 10 mg along with Wellbutrin 300 mg daily.  I recommend to call us back if she has any questions or concern after twisting of the symptom.  At this time and does not have any active or passive suicidal thoughts or homicidal thoughts .  We talked about crisis plan that anytime having active suicidal thoughts or homicidal thoughts continue to call 911 or go to local emergency room.  Patient and her husband agree.  I will see her again in 10 days.  Time spent 30 minutes.  Yordan Martindale T., MD 06/13/2012

## 2012-06-20 ENCOUNTER — Ambulatory Visit (INDEPENDENT_AMBULATORY_CARE_PROVIDER_SITE_OTHER): Payer: Commercial Managed Care - PPO | Admitting: Psychiatry

## 2012-06-20 ENCOUNTER — Ambulatory Visit (HOSPITAL_COMMUNITY)
Admission: AD | Admit: 2012-06-20 | Discharge: 2012-06-20 | Disposition: A | Discharge status: Home / Self Care | Payer: Commercial Managed Care - PPO | Attending: Psychiatry | Admitting: Psychiatry

## 2012-06-20 ENCOUNTER — Encounter (HOSPITAL_COMMUNITY): Payer: Self-pay | Admitting: Psychiatry

## 2012-06-20 VITALS — BP 139/84 | HR 89 | Wt 177.0 lb

## 2012-06-20 DIAGNOSIS — F329 Major depressive disorder, single episode, unspecified: Secondary | ICD-10-CM

## 2012-06-20 DIAGNOSIS — F411 Generalized anxiety disorder: Secondary | ICD-10-CM | POA: Insufficient documentation

## 2012-06-20 DIAGNOSIS — F332 Major depressive disorder, recurrent severe without psychotic features: Secondary | ICD-10-CM | POA: Insufficient documentation

## 2012-06-20 MED ORDER — QUETIAPINE FUMARATE 100 MG PO TABS: 100.0000 mg | ORAL_TABLET | Freq: Every day | ORAL | Status: DC

## 2012-06-20 NOTE — BH Assessment (Signed)
Assessment Note   Donna Black is an 47 y.o. female. Dr Lolly Mustache sent pt from outpt to be assessed for psych-iop. Pt  Reports increased depression and anxiety. She reports her depression started 3 years ago and since that time she was diagnosed with cancer and has had 2 surgeries.  Her mother died 2011/08/17 and her dad has begun to drink excessively. She did receive her BA from [employer] in May 2013 but is unable to work due to her depression, crying spells, anxiety and inability to stay focused, isolating, lack of sleep and poor relationship with her husband.  She gets alone well with her adult daughter who lives out of state and her 79 yr old son who is in the home. She reports being the one to hold the family together.She had had no sleep in the past 2-3 weeks. She finds herself drinking coffee and smoking all day and night.  Recently she has increased her wine consumption.  She drinks 1 glass to 1 1/2 bottle daily,.  She denies any other substance abuse.  Pt cried throughout the assessment reporting she fells like she is on an emotional roller coaster.  Pt reports attending the psych-iop program when she was discharged from an inpt stay 3 years ago and did well and feels she needs to attend again.  No MSE necessary as pt was sent from out pt for the assessment. Pt to start the program on 06/21/12 @9am .   Axis I: Major Depression, Recurrent severe, Anxiety D/O Axis II: Deferred Axis III:  Past Medical History  Diagnosis Date  . Wears glasses   . Depression   . Cancer     bilateral breast cancer  . Heart murmur     told off and on in past that she has had one  . Grave's disease     diagnosed about 7 yrs ago but after a year she has been doing well  . Anemia     when she was younger   Axis IV: economic problems, occupational problems, other psychosocial or environmental problems, problems related to social environment and problems with primary support group Axis V: 41-50 serious  symptoms  Past Medical History:  Past Medical History  Diagnosis Date  . Wears glasses   . Depression   . Cancer     bilateral breast cancer  . Heart murmur     told off and on in past that she has had one  . Grave's disease     diagnosed about 7 yrs ago but after a year she has been doing well  . Anemia     when she was younger    Past Surgical History  Procedure Laterality Date  . Cholecystectomy    . Wisdom tooth extraction    . Breast fibroadenoma surgery  1987, 2000    bilateral  . Cesarean section      2001  . Breast reconstruction  05/12/2011    Procedure: BREAST RECONSTRUCTION;  Surgeon: Etter Sjogren, MD;  Location: [facility] OR;  Service: Plastics;  Laterality: Bilateral;  PLACEMENT OF BILATERAL TISSUE EXPANDERS WITH POSSIBLE USE OF HD FLEX    Family History:  Family History  Problem Relation Age of Onset  . Breast cancer Maternal Grandmother   . Cancer Maternal Grandmother     breast  . Heart disease Mother   . Stroke Mother   . Heart disease Father     Social History:  reports that she quit smoking about 14 months  ago. Her smoking use included Cigarettes. She smoked 0.50 packs per day. She has never used smokeless tobacco. She reports that she drinks about 1.2 ounces of alcohol per week. She reports that she does not use illicit drugs.  Additional Social History:  Alcohol / Drug Use Pain Medications: na Prescriptions: na Over the Counter: na History of alcohol / drug use?: Yes Substance #1 Name of Substance 1: alcohol 1 - Age of First Use: 11 1 - Amount (size/oz): 1 glass to 1 1/2 bottle of wine 1 - Frequency: daily 1 - Duration: 1 year 1 - Last Use / Amount: 06/19/12  1 glass  CIWA:   COWS:    Allergies:  Allergies  Allergen Reactions  . Penicillins Other (See Comments)    Did not work as a child    Home Medications:  (Not in a hospital admission)  OB/GYN Status:  No LMP recorded.  General Assessment Data Location of Assessment: [facility] Assessment  Services Living Arrangements: Spouse/significant other (and 69 yr old son) Can pt return to current living arrangement?: Yes Admission Status: Voluntary Is patient capable of signing voluntary admission?: Yes Transfer from: Other (Comment) Referral Source: Psychiatrist (DR ARFEEN IN OUTPT)  Education Status Contact person: Donna Black-[phone]  Risk to self Suicidal Ideation: No Suicidal Intent: No Is patient at risk for suicide?: No Suicidal Plan?: No Access to Means: No What has been your use of drugs/alcohol within the last 12 months?: WINE How many times?: 1 Other Self Harm Risks: NA Triggers for Past Attempts: Family contact (DEATH OF MOTHER) Intentional Self Injurious Behavior: None Family Suicide History: No Recent stressful life event(s): Loss (Comment);Conflict (Comment) Persecutory voices/beliefs?: No Depression: Yes Depression Symptoms: Despondent;Insomnia;Tearfulness;Isolating;Fatigue;Loss of interest in usual pleasures;Feeling worthless/self pity Substance abuse history and/or treatment for substance abuse?: Yes Suicide prevention information given to non-admitted patients: Yes  Risk to Others Homicidal Ideation: No Thoughts of Harm to Others: No Current Homicidal Intent: No Current Homicidal Plan: No Access to Homicidal Means: No Identified Victim: denies History of harm to others?: No Assessment of Violence: None Noted Violent Behavior Description: none Does patient have access to weapons?: No Criminal Charges Pending?: No Does patient have a court date: No  Psychosis Hallucinations: None noted Delusions: None noted  Mental Status Report Appear/Hygiene: Improved Eye Contact: Good Motor Activity: Freedom of movement;Restlessness Speech: Logical/coherent;Slow Level of Consciousness: Alert;Crying;Restless Mood: Depressed;Despair;Elated;Helpless;Sad Affect: Depressed;Sad;Appropriate to circumstance Anxiety Level: Minimal Thought  Processes: Coherent;Relevant Judgement: Unimpaired Orientation: Person;Place;Time;Situation Obsessive Compulsive Thoughts/Behaviors: None  Cognitive Functioning Concentration: Normal Memory: Recent Intact;Remote Intact IQ: Average Insight: Fair Impulse Control: Fair Appetite: Fair Weight Gain: 60 (over 2 yr period from abilify) Sleep: Decreased Total Hours of Sleep: 2 Vegetative Symptoms: None  ADLScreening [facility] Assessment Services) Patient's cognitive ability adequate to safely complete daily activities?: Yes Patient able to express need for assistance with ADLs?: Yes Independently performs ADLs?: Yes (appropriate for developmental age)  Abuse/Neglect [facility]) Physical Abuse: Denies Verbal Abuse: Denies Sexual Abuse: Denies  Prior Inpatient Therapy Prior Inpatient Therapy: Yes Prior Therapy Facilty/Provider(s): [facility] Reason for Treatment: depressed/suicidal  Prior Outpatient Therapy Prior Outpatient Therapy: Yes Prior Therapy Dates: dr Lolly Mustache Prior Therapy Facilty/Provider(s): [facility] Reason for Treatment: depression  ADL Screening (condition at time of admission) Patient's cognitive ability adequate to safely complete daily activities?: Yes Patient able to express need for assistance with ADLs?: Yes Independently performs ADLs?: Yes (appropriate for developmental age) Weakness of Legs: None Weakness of Arms/Hands: None     Therapy Consults (therapy consults  require a physician order) PT Evaluation Needed: No OT Evalulation Needed: No SLP Evaluation Needed: No Abuse/Neglect Assessment (Assessment to be complete while patient is alone) Physical Abuse: Denies Verbal Abuse: Denies Sexual Abuse: Denies     Advance Directives (For Healthcare) Advance Directive: Patient does not have advance directive;Patient would not like information Pre-existing out of facility DNR order (yellow form or pink MOST form): No    Additional Information 1:1 In Past 12 Months?:  No CIRT Risk: No Elopement Risk: No Does patient have medical clearance?: No     Disposition: Referred to psych-iop    Disposition Initial Assessment Completed for this Encounter: Yes Disposition of Patient: Outpatient treatment Type of outpatient treatment: Psych Intensive Outpatient  On Site Evaluation by:   Reviewed with Physician:     Hattie Perch Winford 06/20/2012 1:40 PM

## 2012-06-20 NOTE — Progress Notes (Signed)
[facility] [id number] Progress Note  Donna Black [id number] 47 y.o.  06/20/2012 10:47 AM  Chief Complaint:  Walk in with crisis.   History of Present Illness: Patient is 47 year old Caucasian married female who came as a walk-in with her husband in a crisis.  She was seen last week .  At that time she was delusional manic irritable and admitted not taking her psychiatric medication.  Her Abilify was increased .  Today patient was brought in by her husband who is very concerned about her behavior.  She is not sleeping , irritable agitated and delusional that her husband is talking to other people about her .  She believed that people who are wearing red cloths are on the side of her husband. This morning she had appointment for dentist who apparently wearing red coat and patient believed that dentist is also part of her husband game.  Patient do not trust her husband .  She believes husband is giving messages from television .  She continues to believe that husband is showing her naked pictures to other people.  As per husband patient is very irritable and agitated.  She's not sleeping.  Patient admitted having crying spells and feeling disgusted with the husband's behavior.  However during the conversation patient was very calm and being supportive.  Patient believed that her husband should see a psychiatrist or see counselor.  Patient denies any hallucinations or any active suicidal thoughts however endorse fleeting and passive suicidal thinking but she cannot live like this.  She has no plan.  She wants to get better.  In the beginning she refused to accept that she has psychiatric illness however after some discussion and explanation she accepted that she needs help.  She mentioned that she will not kill herself due to her children.  She wants to live and wants to get better.  She endorsed that she is taking her medication regularly however also admitted that recently she's been drinking  more than usual.  She feels drinking helping her anxiety and sleep.  Suicidal Ideation: Yes Plan Formed: No Patient has means to carry out plan: No  Homicidal Ideation: No Plan Formed: No Patient has means to carry out plan: No  Review of Systems  Constitutional: Positive for malaise/fatigue.  Eyes: Negative.   Respiratory: Negative.   Cardiovascular: Negative.   Musculoskeletal: Negative.   Neurological: Positive for headaches.    Psychiatric: Agitation: Yes Hallucination: Yes Depressed Mood: No Insomnia: Yes Hypersomnia: No Altered Concentration: Yes Feels Worthless: Yes Grandiose Ideas: No Belief In Special Powers: Belief TV is talking about her. New/Increased Substance Abuse: Yes Compulsions: No  Neurologic: Headache: No Seizure: No Paresthesias: No  Past psychiatric history. Patient has been seeing in this office since 2009.  She was admitted at [facility] due to suicidal attempt when she took overdose on her medication.  At that time she was taking Prozac which was stopped.    Medical history . Patient has history of bilateral mastectomy, hypothyroidism , anemia and heart murmur.  She see Dr. Johnn Hai at [facility] physician.  Her recent blood work shows hyperlipidemia.    History: Patient lives with her husband.  Outpatient Encounter Prescriptions as of 06/20/2012  Medication Sig Dispense Refill  . buPROPion (WELLBUTRIN XL) 300 MG 24 hr tablet Take 1 tablet (300 mg total) by mouth daily.  90 tablet  0  . QUEtiapine (SEROQUEL) 100 MG tablet Take 1 tablet (100 mg total) by mouth at bedtime.  30 tablet  0  . [DISCONTINUED] ARIPiprazole (ABILIFY) 10 MG tablet Take 1 tablet (10 mg total) by mouth daily.  30 tablet  0   No facility-administered encounter medications on file as of 06/20/2012.    Past Psychiatric History/Hospitalization(s): Anxiety: Yes Bipolar Disorder: No Depression: Yes Mania: No Psychosis: Yes Schizophrenia: No Personality  Disorder: No Hospitalization for psychiatric illness: Yes History of Electroconvulsive Shock Therapy: No Prior Suicide Attempts: Yes  Physical Exam: Constitutional:  BP 139/84  Pulse 89  Wt 177 lb (80.287 kg)  BMI 30.37 kg/m2  General Appearance: well nourished patient is emotional.  Musculoskeletal: Strength & Muscle Tone: within normal limits Gait & Station: normal Patient leans: N/A  Psychiatric: Speech (describe rate, volume, coherence, spontaneity, and abnormalities if any): Clear and coherent.  Thought Process (describe rate, content, abstract reasoning, and computation): Logical and goal-directed.  Associations: Coherent, Irrelevant, Loose and Paranoid and delusional.  Thoughts: normal  Mental Status: Orientation: oriented to person, place, time/date and situation Mood & Affect: anxiety, elevated affect and labile affect Attention Span & Concentration: Fair  Medical Decision Making (Choose Three): New problem, with additional work up planned, Review of Psycho-Social Stressors (1), Established Problem, Worsening (2), Review of Last Therapy Session (1), Review of Medication Regimen & Side Effects (2) and Review of New Medication or Change in Dosage (2)  Assessment: Axis I: Maj. depressive disorder  Axis II: Deferred  Axis III: See medical history  Axis IV: Mild  Axis V: 65-70   Plan:  Crisis intervention done.  All for inpatient psychiatric treatment but patient refused.  This time patient does not meet criteria for involuntary commitment.  Husband taking responsibility of the patient's safety .  I will discontinue Abilify since it is not helping her.  We will try Seroquel 100 mg at bedtime.  In the past she has taken Seroquel and do not recall any side effects.  She told it was discontinued because it was making her tired but there were no tremors shakes or any other side effects she remembered.  I recommend to take 50 mg for first 2 days and then gradually  increased to 100 mg.  I also recommend intensive outpatient program.  Plan discussed in detail with the husband and the patient and both agreed.  In the meantime I discussed safety plan that anytime having active suicidal thoughts or homicidal thoughts and he need to call 911 or bring patient to the emergency room .  Explained risks and benefits in detail about the Seroquel and Wellbutrin.  Patient was given.  Patient will start intensive outpatient program tomorrow.  Recommend to see assessment for paperwork .  Time spent 30 minutes.    Stormy Connon T., MD 06/20/2012

## 2012-06-21 ENCOUNTER — Other Ambulatory Visit (HOSPITAL_COMMUNITY): Payer: Commercial Managed Care - PPO | Attending: Psychiatry | Admitting: Psychiatry

## 2012-06-21 DIAGNOSIS — F329 Major depressive disorder, single episode, unspecified: Secondary | ICD-10-CM

## 2012-06-21 MED ORDER — MIRTAZAPINE 15 MG PO TABS: 15.0000 mg | ORAL_TABLET | Freq: Every day | ORAL | Status: DC

## 2012-06-21 NOTE — Progress Notes (Signed)
    Daily Group Progress Note  Program: IOP  Group Time: 9:00-10:30 am   Participation Level: None  Behavioral Response: Rigid  Type of Therapy:  Process Group  Summary of Progress: Today was patients first day in the group. She appeared disengaged and presented with angry affect. She stared forward and did not appear attentive.      Group Time: 10:30 am - 12:00 pm   Participation Level:  None  Behavioral Response: Rigid  Type of Therapy: Psycho-education Group  Summary of Progress: Patient was introduced to the skill of healthy boundary setting and how to use it to ensure wellness. Patient was assigned homework to explore personal boundaries that need to be set to enhance wellness.  Carman Ching, LCSW
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
## 2012-06-22 ENCOUNTER — Ambulatory Visit (HOSPITAL_COMMUNITY): Payer: Self-pay | Admitting: Psychiatry

## 2012-06-23 ENCOUNTER — Other Ambulatory Visit (HOSPITAL_COMMUNITY): Payer: Commercial Managed Care - PPO | Attending: Psychiatry | Admitting: Psychiatry

## 2012-06-23 DIAGNOSIS — F332 Major depressive disorder, recurrent severe without psychotic features: Secondary | ICD-10-CM | POA: Insufficient documentation

## 2012-06-23 DIAGNOSIS — F411 Generalized anxiety disorder: Secondary | ICD-10-CM | POA: Insufficient documentation

## 2012-06-23 DIAGNOSIS — F329 Major depressive disorder, single episode, unspecified: Secondary | ICD-10-CM
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
## 2012-06-25 NOTE — Progress Notes (Signed)
Psychiatric Assessment Adult  Patient Identification:  Donna Black Date of Evaluation:  4/30 Chief Complaint: 47 year old Caucasian married female who came as a walk-in with her husband in a crisis yesterday to see Dr. Lolly Mustache. She was seen last week . At that time she was delusional manic irritable and admitted not taking her psychiatric medication. Her Abilify was increased . Yesterday, patient was brought in by her husband who was very concerned about her behavior. She is not sleeping , irritable agitated and delusional that her husband is talking to other people about her . She believed that people who are wearing red clothes, are on the side of her husband. Yesterday, she had appointment for dentist who apparently wearing red coat and patient believed that dentist is also part of her husband game. Patient do not trust her husband . She believes husband is giving messages from television . She continues to believe that husband is showing her naked pictures to other people. As per husband patient is very irritable and agitated. She's not sleeping. Patient admitted having crying spells and feeling disgusted with the husband's behavior.  Also believes thatsome one has gotten into her computer and has created files in her name. . Patient believed that her husband should see a psychiatrist or see counselor. Patient denies any hallucinations or any active suicidal thoughts however endorse fleeting and passive suicidal thinking but she cannot live like this. She has no plan. She wants to get better. In the beginning she refused to accept that she has psychiatric illness however after some discussion and explanation she accepted that she needs help. She mentioned that she will not kill herself due to her children. She wants to live and wants to get better. She endorsed that she is taking her medication regularly however also admitted that recently she's been drinking more than usual. She feels drinking helping  her anxiety and sleep. She drinks 6 cups of coofee and caffienated soda all day.  Mom  Died in 07-08-2013and her dad ha been drinking heavily. Pt was started on Seroquel 100 mg by Dr Lolly Mustache. History of Chief Complaint:   Chief Complaint  Patient presents with  . Anxiety  . Depression    HPI Review of Systems Physical Exam  Depressive Symptoms: depressed mood, anhedonia, insomnia, psychomotor agitation, feelings of worthlessness/guilt, difficulty concentrating, hopelessness, recurrent thoughts of death, anxiety, panic attacks, decreased appetite,  (Hypo) Manic Symptoms:   Elevated Mood:  No Irritable Mood:  Yes Grandiosity:  No Distractibility:  Yes Labiality of Mood:  Yes Delusions:  yes Hallucinations:  No Impulsivity:  No Sexually Inappropriate Behavior:  No Financial Extravagance:  No Flight of Ideas:  Yes  Anxiety Symptoms: Excessive Worry:  Yes Panic Symptoms:  Yes Agoraphobia:  Negative Obsessive Compulsive: No  Symptoms: None, Specific Phobias:  No Social Anxiety:  No  Psychotic Symptoms:  Hallucinations: No None Delusions:  Yes Paranoia:  Yes   Ideas of Reference:  Yes  PTSD Symptoms:None  Traumatic Brain Injury: Yes   Past Psychiatric History: Diagnosis: depression , anxiety  Hospitalizations:   Outpatient Care: Dr Lolly Mustache  Substance Abuse Care:   Self-Mutilation:   Suicidal Attempts:   Violent Behaviors:    Past Medical History:   Past Medical History  Diagnosis Date  . Wears glasses   . Depression   . Cancer     bilateral breast cancer  . Heart murmur     told off and on in past that she has had one  .  Grave's disease     diagnosed about 7 yrs ago but after a year she has been doing well  . Anemia     when she was younger   History of Loss of Consciousness:  No Seizure History:  No Cardiac History:  No Allergies:   Allergies  Allergen Reactions  . Penicillins Other (See Comments)    Did not work as a child   Current  Medications:  Current Outpatient Prescriptions  Medication Sig Dispense Refill  . mirtazapine (REMERON) 15 MG tablet Take 1 tablet (15 mg total) by mouth at bedtime.  30 tablet  0  . QUEtiapine (SEROQUEL) 100 MG tablet Take 1 tablet (100 mg total) by mouth at bedtime.  30 tablet  0   No current facility-administered medications for this visit.    Previous Psychotropic Medications:  Medication Dose   wellbutrin,                        Substance Abuse History in the last 12 months: Substance Age of 1st Use Last Use Amount Specific Type  Nicotine teen today 1 pack per day   Alcohol      Cannabis      Opiates      Cocaine      Methamphetamines      LSD      Ecstasy      Benzodiazepines      Caffeine teens today 6 cups of coffee, iced tea and soda   Inhalants      Others:                          Medical Consequences of Substance Abuse:   Legal Consequences of Substance Abuse:   Family Consequences of Substance Abuse:   Blackouts:  No DT's:  No Withdrawal Symptoms:  No None  Social History: Current Place of Residence[place] Birth:  Family Members:  Marital Status:  Married Children: 3  Sons:   Daughters:  Relationships:  Education:  HS Print production planner Problems/Performance:  Religious Beliefs/Practices:  History of Abuse: none Teacher, music History:  None. Legal History: none Hobbies/Interests:   Family History:   Family History  Problem Relation Age of Onset  . Breast cancer Maternal Grandmother   . Cancer Maternal Grandmother     breast  . Heart disease Mother   . Stroke Mother   . Heart disease Father     Mental Status Examination/Evaluation: Objective:  Appearance: Casual  Eye Contact::  Minimal  Speech:  Pressured  Volume:  Normal  Mood:  Depressed and anxious, paranoid  Affect:  Constricted, Depressed, Labile, Restricted and Tearful  Thought Process:  Disorganized and Linear  Orientation:   Full (Time, Place, and Person)  Thought Content:  Paranoid Ideation and Rumination  Suicidal Thoughts:  No  Homicidal Thoughts:  No  Judgement:  Impaired  Insight:  Lacking  Psychomotor Activity:  Increased and Restlessness  Akathisia:  No  Handed:  Right  AIMS (if indicated):  0  Assets:  Communication Skills Desire for Improvement Physical Health Resilience Social Support    Laboratory/X-Ray Psychological Evaluation(s)        Assessment:  Axis I: Generalized Anxiety Disorder and Major Depression, Recurrent severewith psychotic features.                                Ceffiene  abuse ,  AXIS I Generalized Anxiety Disorder, Major Depression, Recurrent severe and delusional , Caffiens abuse  AXIS II Cluster B Traits  AXIS III Past Medical History  Diagnosis Date  . Wears glasses   . Depression   . Cancer     bilateral breast cancer  . Heart murmur     told off and on in past that she has had one  . Grave's disease     diagnosed about 7 yrs ago but after a year she has been doing well  . Anemia     when she was younger     AXIS IV other psychosocial or environmental problems, problems related to social environment and problems with primary support group  AXIS V 51-60 moderate symptoms   Treatment Plan/Recommendations:  Plan of Care: start iop  Laboratory:  none  Psychotherapy: group and individual  Medications: dc wellbutrin, Discussed R/R/B/O of remeron and pt gave informed consent to start Remeron 15 mg q hs.Continue Seroquel 100 mg po q hs.  Routine PRN Medications:  Yes  Consultations:   Safety Concerns:  none  Other:  LOS 2 weeks    Margit Banda, MD 5/4/201412:45 PM

## 2012-06-26 ENCOUNTER — Other Ambulatory Visit (HOSPITAL_COMMUNITY): Payer: Commercial Managed Care - PPO | Admitting: Psychiatry

## 2012-06-26 DIAGNOSIS — F329 Major depressive disorder, single episode, unspecified: Secondary | ICD-10-CM

## 2012-06-26 NOTE — Progress Notes (Signed)
    Daily Group Progress Note  Program: IOP  Group Time: 9:00-10:30 am   Participation Level: Active  Behavioral Response: Appropriate  Type of Therapy:  Process Group  Summary of Progress: Patient wanted to talk privately with Clinical research associate before group. Patient was concerned that the group members did not believe her when she talked last week and was fearful that they thought she was lying. Patient presented a binder of information that supported what her daughters had experienced. Writer ensured patient that she not seen as being mistrustful. Patient said she did not feel safe sharing her concerns with the group because she still does not feel very safe in the group. Patient continues to have paranoid thinking that is fueling feelings of anxiety and is working on IT consultant and the group. Patient did smile and is showing more affect in response to others sharing.      Group Time: 10:30 am - 12:00 pm   Participation Level:  Active  Behavioral Response: Appropriate  Type of Therapy: Psycho-education Group  Summary of Progress: Patient listened and supported other members who needed more time to share about having a difficult weekend managing their depression.   Carman Ching, LCSW

## 2012-06-27 ENCOUNTER — Other Ambulatory Visit (HOSPITAL_COMMUNITY): Payer: Commercial Managed Care - PPO | Admitting: Psychiatry

## 2012-06-27 DIAGNOSIS — F329 Major depressive disorder, single episode, unspecified: Secondary | ICD-10-CM
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
## 2012-06-28 ENCOUNTER — Other Ambulatory Visit (HOSPITAL_COMMUNITY): Payer: Commercial Managed Care - PPO | Admitting: Psychiatry

## 2012-06-28 DIAGNOSIS — F329 Major depressive disorder, single episode, unspecified: Secondary | ICD-10-CM

## 2012-06-28 NOTE — Progress Notes (Signed)
    Daily Group Progress Note  Program: IOP  Group Time: 9:00-10:30 am   Participation Level: Active  Behavioral Response: Appropriate  Type of Therapy:  Process Group  Summary of Progress: Patient is opening up more in the group and showing increased affect. She is talking and interacting with other members more. She shared excitement about getting a phone call yesterday about a job interview and said she feels "good" today. Patient is still learning how to identify and express painful feelings associated with previous traumas and current stressors that trigger negative emotions.      Group Time: 10:30 am - 12:00 pm   Participation Level:  Active  Behavioral Response: Appropriate  Type of Therapy: Psycho-education Group  Summary of Progress: Patient brought questions regarding the depression talk from yesterday and they were answered and further information on depression was provided.   Carman Ching, LCSW

## 2012-06-29 ENCOUNTER — Other Ambulatory Visit (HOSPITAL_COMMUNITY): Payer: Commercial Managed Care - PPO | Admitting: Psychiatry

## 2012-06-29 DIAGNOSIS — F329 Major depressive disorder, single episode, unspecified: Secondary | ICD-10-CM

## 2012-06-29 NOTE — Progress Notes (Signed)
    Daily Group Progress Note  Program: IOP  Group Time: 9:00-10:30 am   Participation Level: Active  Behavioral Response: Appropriate  Type of Therapy:  Process Group  Summary of Progress: patient continues to talk as she describes feeling more comfortable and safe in the group. She is smiling and showing increased affect. She described the difficulty she had when she married her husband and joined into a blended family situation with her two daughters. Patient described the lack of trust she and her husband both struggle with in the marriage and patient is gaining insight into how some of her behaviors may have contributed to the conflict in the relationship. Patient is expressing her feelings and receiving support and acceptance.      Group Time: 10:30 am - 12:00 pm   Participation Level:  Active  Behavioral Response: Appropriate  Type of Therapy: Psycho-education Group  Summary of Progress: Patient learned about coping skills and the ineffective coping skills that are used that provide some comfort but also bring an increase of stress as a result. Patient was given homework to think about unhealthy copings skills they use to report back to the group tomorrow.   Carman Ching, LCSW

## 2012-06-30 ENCOUNTER — Other Ambulatory Visit (HOSPITAL_COMMUNITY): Payer: Commercial Managed Care - PPO | Admitting: Psychiatry

## 2012-06-30 DIAGNOSIS — F329 Major depressive disorder, single episode, unspecified: Secondary | ICD-10-CM

## 2012-06-30 NOTE — Progress Notes (Signed)
    Daily Group Progress Note  Program: IOP  Group Time: 9:00-10:30 am   Participation Level: Active  Behavioral Response: Appropriate  Type of Therapy:  Process Group  Summary of Progress: Patient states she continues to have increased trust in the group. She talked about conflict she is having in her marriage and uncertainity about how she wants to handle it. She received support and understanding from the group.      Group Time: 10:30 am - 12:00 pm   Participation Level:  Active  Behavioral Response: Appropriate  Type of Therapy: Psycho-education Group  Summary of Progress:  Patient processed a client crisis that occurred during the break that caused feelings of fear and worry and practiced using distraction skills to reduce anxiety symptoms. Patient also explored tendencies to care take for others and how this effects their personal wellness.   Carman Ching, LCSW

## 2012-07-03 ENCOUNTER — Other Ambulatory Visit (HOSPITAL_COMMUNITY): Payer: Commercial Managed Care - PPO | Admitting: Psychiatry

## 2012-07-03 DIAGNOSIS — F329 Major depressive disorder, single episode, unspecified: Secondary | ICD-10-CM

## 2012-07-03 NOTE — Progress Notes (Signed)
    Daily Group Progress Note  Program: IOP  Group Time: 9:00-10:30 am   Participation Level: Active  Behavioral Response: Appropriate  Type of Therapy:  Process Group  Summary of Progress: Patient reports feeling "ok" today and is not mentioning any paranoid thinking. Patient is processing conflict between her and her husband and mentioned the possibility of marital counseling going forward.      Group Time: 10:30 am - 12:00 pm   Participation Level:  Active  Behavioral Response: Appropriate  Type of Therapy: Psycho-education Group  Summary of Progress: Patient participated in a grief and loss group, identified current losses and effective grieving strategies.   Carman Ching, LCSW

## 2012-07-04 ENCOUNTER — Other Ambulatory Visit (HOSPITAL_COMMUNITY): Payer: Commercial Managed Care - PPO | Admitting: Psychiatry

## 2012-07-04 DIAGNOSIS — F329 Major depressive disorder, single episode, unspecified: Secondary | ICD-10-CM

## 2012-07-04 NOTE — Progress Notes (Signed)
    Daily Group Progress Note  Program: IOP  Group Time: 9:00-10:30 am   Participation Level: Active  Behavioral Response: Appropriate  Type of Therapy:  Process Group  Summary of Progress: Patient continues to trust the group and shared today how she feels her husband is "watching her" but she does not know how. Members asked if this could be paranoia and patient denied that and said she knows for a fact that he is watching her and sharing personal things with others that paint patient in a negative light. Patient is conflicted because she does not want to leave the marriage but is aware this is the cause of much of her depression and anxiety.      Group Time: 10:30 am - 12:00 pm   Participation Level:  Active  Behavioral Response: Appropriate  Type of Therapy: Psycho-education Group  Summary of Progress: Patient learned the DBT Distress Tolerance skill of ACCEPTS and identified items in each area they can do to minimize distress.   Carman Ching, LCSW

## 2012-07-05 ENCOUNTER — Other Ambulatory Visit (HOSPITAL_COMMUNITY): Payer: Commercial Managed Care - PPO | Admitting: Psychiatry

## 2012-07-05 DIAGNOSIS — F329 Major depressive disorder, single episode, unspecified: Secondary | ICD-10-CM

## 2012-07-05 NOTE — Progress Notes (Signed)
    Daily Group Progress Note  Program: IOP  Group Time: 9:00-10:30 am   Participation Level: Active  Behavioral Response: Appropriate  Type of Therapy:  Process Group  Summary of Progress: Patient talked about feeling disappointed in her husband for how he treats her and how he sends messages that what she does is "never good enough". Patient is trying to learn how to show herself self-love instead of seeking it only from her husband. She said her mood fluctuates based on how he treats her and she is trying to take personal responsibility over her mood.      Group Time: 10:30 am - 12:00 pm   Participation Level:  Active  Behavioral Response: Appropriate  Type of Therapy: Psycho-education Group  Summary of Progress: Patient learned about the DBT Distress Tolerance skills of Self-Soothing and Improving the moment to manage stressful feelings and make them a part of everyday care.   Carman Ching, LCSW

## 2012-07-06 ENCOUNTER — Other Ambulatory Visit (HOSPITAL_COMMUNITY): Payer: Commercial Managed Care - PPO | Admitting: Psychiatry

## 2012-07-06 DIAGNOSIS — F329 Major depressive disorder, single episode, unspecified: Secondary | ICD-10-CM

## 2012-07-06 NOTE — Progress Notes (Signed)
    Daily Group Progress Note  Program: IOP  Group Time: 9:00-10:30 am   Participation Level: Active  Behavioral Response: Appropriate  Type of Therapy:  Process Group  Summary of Progress: Patient was tearful at the start of group and other members expressed concerns for patients sadness. Patient cried as she talked about the continued conflict with her husband. Patient is struggling with wanting to try to change his behavior and realizing she can't change him. Patient talked about how her husband "feels I am crazy". Patient describes sadness associated with being in her marriage but fear associated with leaving. She does not know what to do but is starting to become aware that this is a major trigger for her depression symptoms.      Group Time: 10:30 am - 12:00 pm   Participation Level:  Active  Behavioral Response: Appropriate  Type of Therapy: Psycho-education Group  Summary of Progress: patient learned about mental health support groups and programs through the [facility] and learned how to access them for continued support.   Carman Ching, LCSW

## 2012-07-07 ENCOUNTER — Other Ambulatory Visit (HOSPITAL_COMMUNITY): Payer: Commercial Managed Care - PPO | Admitting: Psychiatry

## 2012-07-07 DIAGNOSIS — F329 Major depressive disorder, single episode, unspecified: Secondary | ICD-10-CM

## 2012-07-07 NOTE — Progress Notes (Signed)
    Daily Group Progress Note  Program: IOP  Group Time: 9:00-10:30 am   Participation Level: Active  Behavioral Response: Appropriate  Type of Therapy:  Process Group  Summary of Progress: Patient participated in a goodbye ceremony to two patients ending the group today and practiced having healthy closure and grieving loss associated with them leaving.       Group Time: 10:30 am - 12:00 pm   Participation Level:  Active  Behavioral Response: Appropriate  Type of Therapy: Psycho-education Group  Summary of Progress:  Patient explored how they would maintain wellness over the weekend and what coping skills would be used to manage wellness until the group resumes again on Monday.  Hesston Hitchens E, LCSW 

## 2012-07-07 NOTE — Progress Notes (Signed)
Patient ID: Donna Black, female   DOB: 05-29-1965, 47 y.o.   MRN: [id number] Patient seen today.  She had a bad day yesterday.  She had argument with her husband after that she had a crying episode.  However overall she is feeling better with her medication program.  She denies any hallucination or any active suicidal thinking or homicidal thinking.  She still has issues with her husband and sometimes she does not trust him.  Patient is scheduled to see a marriage counselor with her husband once she finished intensive outpatient program.  At this time patient is tolerating her medication and denies any side effects.  She sleeping better.  We will not change her medication at this time.

## 2012-07-10 ENCOUNTER — Other Ambulatory Visit (HOSPITAL_COMMUNITY): Payer: Commercial Managed Care - PPO | Admitting: Psychiatry

## 2012-07-10 DIAGNOSIS — F329 Major depressive disorder, single episode, unspecified: Secondary | ICD-10-CM

## 2012-07-10 MED ORDER — QUETIAPINE FUMARATE 100 MG PO TABS: 100.0000 mg | ORAL_TABLET | Freq: Every day | ORAL | Status: DC

## 2012-07-10 MED ORDER — MIRTAZAPINE 15 MG PO TABS: 15.0000 mg | ORAL_TABLET | Freq: Every day | ORAL | Status: DC

## 2012-07-10 NOTE — Patient Instructions (Signed)
Patient completed MH-IOP today.  Will follow up with Dr. Lolly Mustache on 07-24-12 @ 10:45 am.  Pt will schedule appointment with therapist Greig Right, [facility]).  Encouraged support groups.

## 2012-07-10 NOTE — Progress Notes (Signed)
Discharge Note  Patient:  Danikah Budzik is an 47 y.o., female DOB:  08/14/1965  Date of Admission:  06/21/12  Date of Discharge: 07/10/12   Reason for Admission: Paranoia depression and anxiety  Hospital Course: Patient started IOP and had been referred to [name] from Dr. are seen due to increasing depression difficulty in her relationship with her husband depression.  Feeling hopeless and helpless. She also suspected that her husband had installed cameras in the TV  was monitoring her and was feeling jealous that she may be seen another man which she was not. After starting IOP patient's Wellbutrin was discontinued and she was asked to stop drinking her coffee which she consumed and tremendous amounts all day she was also asked to discontinue alcohol and increase her water intake. Her Seroquel 100 mg at bedtime was continued and she was started on Remeron 15 mg at bedtime. Patient stabilized rapidly with good sleep and appetite and calm to with significantly decreased anxiety and almost no paranoia. Patient talked about a strain marital relationship with her husband in groups and did well receiving and giving feedback. She was coping well and was tolerating her medications well.  Mental Status at Discharge: Alert, oriented x3, affect is bright mood is euthymic, speech is normal, as no suicidal or homicidal ideation and has no hallucinations or delusions.  recent and remote memory is good judgment is insight is good, concentration and recall are good.  Lab Results: No results found for this or any previous visit (from the past 48 hour(s)).  Current outpatient prescriptions:mirtazapine (REMERON) 15 MG tablet, Take 1 tablet (15 mg total) by mouth at bedtime., Disp: 30 tablet, Rfl: 0;  QUEtiapine (SEROQUEL) 100 MG tablet, Take 1 tablet (100 mg total) by mouth at bedtime., Disp: 30 tablet, Rfl: 0  Axis Diagnosis:   Axis I: Generalized Anxiety Disorder, Major Depression, Recurrent severe and Substance  Abuse Axis II: Deferred Axis III:  Past Medical History  Diagnosis Date  . Wears glasses   . Depression   . Cancer     bilateral breast cancer  . Heart murmur     told off and on in past that she has had one  . Grave's disease     diagnosed about 7 yrs ago but after a year she has been doing well  . Anemia     when she was younger   Axis IV: other psychosocial or environmental problems, problems related to social environment and problems with primary support group Axis V: 61-70 mild symptoms   Level of Care:  OP  Discharge destination:  Home  Is patient on multiple antipsychotic therapies at discharge:  No    Has Patient had three or more failed trials of antipsychotic monotherapy by history:  No  Patient phone:  [phone] (home)  Patient address:   [address] [place] [address],   Follow-up recommendations:  Activity:  As tolerated Diet:  Regular Other:  Followup with Dr. Lolly Mustache for medications and Loman Brooklyn for therapy  Comments:    The patient received suicide prevention pamphlet:  No   Margit Banda 07/10/2012, 11:28 AM
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
## 2012-07-11 ENCOUNTER — Other Ambulatory Visit (HOSPITAL_COMMUNITY): Payer: Commercial Managed Care - PPO

## 2012-07-11 ENCOUNTER — Telehealth (HOSPITAL_COMMUNITY): Payer: Self-pay | Admitting: Psychiatry

## 2012-07-11 NOTE — Telephone Encounter (Signed)
D:  Pt's husband arrived at [facility] requesting an earlier appointment for patient than the one which was given and also had a letter for Dr. Lolly Mustache.  He received an earlier appointment (07-12-12) for patient.  Writer called pt to check on her and to inform her about appointment change.  Pt states that her husband is not concerned about her well-being.  "If he was that concerned he would've called me back when he was on his golf trip."  Pt states that she will not be coming to see Dr. Lolly Mustache tomorrow.  Reports she is fine and will keep previous appointment (July 24, 2012).  Pt denies any SI/HI,  A/V hallucinations, or paranoia.  A:   Encouraged pt to call the two therapists with whom current therapist recommended.  This Clinical research associate also provided pt with another therapist's name [name], CNS), who could probably see pt as early as this week.  Reiterated to pt to call 911 or go to her nearest ED if crisis occurs.  Informed Dr. Lolly Mustache and Dr. Rutherford Limerick.  R:  Pt receptive.

## 2012-07-12 ENCOUNTER — Encounter (HOSPITAL_COMMUNITY): Payer: Self-pay | Admitting: Psychiatry

## 2012-07-12 ENCOUNTER — Ambulatory Visit (HOSPITAL_COMMUNITY): Payer: Self-pay | Admitting: Psychiatry

## 2012-07-12 ENCOUNTER — Other Ambulatory Visit (HOSPITAL_COMMUNITY): Payer: Commercial Managed Care - PPO

## 2012-07-12 ENCOUNTER — Ambulatory Visit (INDEPENDENT_AMBULATORY_CARE_PROVIDER_SITE_OTHER): Payer: Commercial Managed Care - PPO | Admitting: Psychiatry

## 2012-07-12 VITALS — BP 139/69 | HR 95 | Ht 64.75 in | Wt 176.8 lb

## 2012-07-12 DIAGNOSIS — F329 Major depressive disorder, single episode, unspecified: Secondary | ICD-10-CM

## 2012-07-12 DIAGNOSIS — F331 Major depressive disorder, recurrent, moderate: Secondary | ICD-10-CM

## 2012-07-12 NOTE — Progress Notes (Signed)
[facility] [address] Progress Note  Donna Black [id number] 47 y.o.  07/12/2012 4:47 PM  Chief Complaint:  I was forced to make this appointment by my husband.     History of Present Illness: Patient is 47 year old Caucasian married female who recently finished intensive outpatient program.  Patient is scheduled to see this Clinical research associate on June 2 however her husband made this appointment .  Earlier her husband has send [name] a letter stating that patient continues to engage in paranoid thinking.  As per patient's husband, she still believed people are watching her and she is going to his phone and computer.  I met with the patient , patient justify her act.  She admitted that she does not believe her husband.  However patient denies any active or passive suicidal thoughts or homicidal thoughts.  Today patient saw [name] for individual therapy.  Her previous therapist Sue Lush is closing her practice .  Patient also make appointment for her husband to see in the same practice .  Husband is agree.  Patient is sleeping better.  She is not drinking and had cut down her caffeine intake.  She still feels sometimes that husband is placed either camera or device to watch her activities.  There has been a lot of tension between them .  In the past husband was concerned because patient was delusional that anyone who appears bipolar and recall are is going to get her.  Patient denies these symptoms today.  Patient endorse that she is feeling better.  She denies any hallucination or any aggression.  Patient admitted that there is a lot of tension but hoping to things get better.  I met with patient's husband and described lack of communication can cause worsening of the symptoms.  Husband agree for marriage counseling.  Patient admitted side effects of medication.  I also talked about increasing the Seroquel to 150 mg to help anxiety and nervousness which patient agreed.  She will continue Remeron at  present dose.  Patient is now drinking in recent weeks.  She's not using any illegal substance.  Suicidal Ideation: No Plan Formed: No Patient has means to carry out plan: No  Homicidal Ideation: No Plan Formed: No Patient has means to carry out plan: No  Review of Systems  Constitutional: Positive for malaise/fatigue.  Eyes: Negative.   Respiratory: Negative.   Cardiovascular: Negative.   Musculoskeletal: Negative.   Neurological: Positive for headaches.    Psychiatric: Agitation: Yes Hallucination: Yes Depressed Mood: No Insomnia: Yes Hypersomnia: No Altered Concentration: Yes Feels Worthless: Yes Grandiose Ideas: No Belief In Special Powers: Belief TV is talking about her. New/Increased Substance Abuse: Yes Compulsions: No  Neurologic: Headache: No Seizure: No Paresthesias: No  Past psychiatric history. Patient has been seeing in this office since 2009.  She was admitted at [facility] due to suicidal attempt when she took overdose on her medication.  At that time she was taking Prozac which was stopped.    Medical history . Patient has history of bilateral mastectomy, hypothyroidism , anemia and heart murmur.  She see Dr. Johnn Hai at [facility] physician.  Her recent blood work shows hyperlipidemia.    History: Patient lives with her husband.  Outpatient Encounter Prescriptions as of 07/12/2012  Medication Sig Dispense Refill  . mirtazapine (REMERON) 15 MG tablet Take 1 tablet (15 mg total) by mouth at bedtime.  30 tablet  0  . QUEtiapine (SEROQUEL) 100 MG tablet Take 150 mg by mouth  at bedtime.      . [DISCONTINUED] QUEtiapine (SEROQUEL) 100 MG tablet Take 1 tablet (100 mg total) by mouth at bedtime.  30 tablet  0   No facility-administered encounter medications on file as of 07/12/2012.    Past Psychiatric History/Hospitalization(s): Anxiety: Yes Bipolar Disorder: No Depression: Yes Mania: No Psychosis: Yes Schizophrenia: No Personality  Disorder: No Hospitalization for psychiatric illness: Yes History of Electroconvulsive Shock Therapy: No Prior Suicide Attempts: Yes  Physical Exam: Constitutional:  BP 139/69  Pulse 95  Ht 5' 4.75" (1.645 m)  Wt 176 lb 12.8 oz (80.196 kg)  BMI 29.64 kg/m2  General Appearance: well nourished patient is emotional.  Musculoskeletal: Strength & Muscle Tone: within normal limits Gait & Station: normal Patient leans: N/A  Psychiatric: Speech (describe rate, volume, coherence, spontaneity, and abnormalities if any): Clear and coherent.  Thought Process (describe rate, content, abstract reasoning, and computation): Logical and goal-directed.  Associations: Coherent, Relevant and Intact  Thoughts: normal  Mental Status: Orientation: oriented to person, place, time/date and situation Mood & Affect: anxiety, elevated affect and labile affect Attention Span & Concentration: Fair  Medical Decision Making (Choose Three): Established Problem, Stable/Improving (1), New problem, with additional work up planned, Review of Psycho-Social Stressors (1), Established Problem, Worsening (2), Review of Last Therapy Session (1), Review of Medication Regimen & Side Effects (2) and Review of New Medication or Change in Dosage (2)  Assessment: Axis I: Maj. depressive disorder  Axis II: Deferred  Axis III: See medical history  Axis IV: Mild  Axis V: 65-70   Plan:  As mentioned above I would increase her a call to 150 mg at bedtime.  Patient will see [name] pharyngeal counseling and also patient and her husband agree for marriage counseling .  Plan discussed in detail but the patient .  Reassurance given.  Patient will continue Remeron at present dose.  Risk and benefits of medication discussed.  Recommend to call us if she has any question concerns or if she feeling having any suicidal thoughts or homicidal thoughts.  Time spent 45 minutes.    Zygmunt Mcglinn T.,  MD 07/12/2012

## 2012-07-13 ENCOUNTER — Other Ambulatory Visit (HOSPITAL_COMMUNITY): Payer: Commercial Managed Care - PPO

## 2012-07-14 ENCOUNTER — Other Ambulatory Visit (HOSPITAL_COMMUNITY): Payer: Commercial Managed Care - PPO

## 2012-07-18 ENCOUNTER — Other Ambulatory Visit (HOSPITAL_COMMUNITY): Payer: Commercial Managed Care - PPO

## 2012-07-19 ENCOUNTER — Other Ambulatory Visit (HOSPITAL_COMMUNITY): Payer: Commercial Managed Care - PPO

## 2012-07-20 ENCOUNTER — Other Ambulatory Visit (HOSPITAL_COMMUNITY): Payer: Commercial Managed Care - PPO

## 2012-07-24 ENCOUNTER — Ambulatory Visit (HOSPITAL_COMMUNITY): Payer: Self-pay | Admitting: Psychiatry

## 2012-08-02 ENCOUNTER — Ambulatory Visit (HOSPITAL_COMMUNITY): Payer: Self-pay | Admitting: Psychiatry

## 2012-08-03 ENCOUNTER — Ambulatory Visit (INDEPENDENT_AMBULATORY_CARE_PROVIDER_SITE_OTHER): Payer: Commercial Managed Care - PPO | Admitting: Psychiatry

## 2012-08-03 ENCOUNTER — Encounter (HOSPITAL_COMMUNITY): Payer: Self-pay | Admitting: Psychiatry

## 2012-08-03 VITALS — BP 121/79 | HR 96 | Ht 64.75 in | Wt 178.6 lb

## 2012-08-03 DIAGNOSIS — F329 Major depressive disorder, single episode, unspecified: Secondary | ICD-10-CM

## 2012-08-03 DIAGNOSIS — F331 Major depressive disorder, recurrent, moderate: Secondary | ICD-10-CM

## 2012-08-03 MED ORDER — QUETIAPINE FUMARATE 100 MG PO TABS: 100.0000 mg | ORAL_TABLET | Freq: Every day | ORAL | Status: DC

## 2012-08-03 MED ORDER — MIRTAZAPINE 15 MG PO TABS: 15.0000 mg | ORAL_TABLET | Freq: Every day | ORAL | Status: DC

## 2012-08-03 NOTE — Progress Notes (Signed)
[facility] [address] Progress Note  Donna Black [id number] 47 y.o.  08/03/2012 4:31 PM  Chief Complaint:  Medication management and followup.     History of Present Illness: Patient is 47 year old Caucasian married female who came for her followup appointment.  Patient is complying the circle 150 mg at bedtime and Remeron 15 mg at bedtime.  Patient complaining of sedation and feeling tired during the day.  She continues to have issues with the husband.  She feels that her husband does not care about her.  Patient told he again went for 3 day golf trip and did not bother to communicate.  She also feels that her medications was increased because husband reported to [name] about the behavior.  She is seeing [name] and like to continue therapy.  She is very comfortable with a therapist.  Patient admitted long-standing issues and trust problem with the husband.  Patient denies any recent agitation anger or any mood swing but admitted getting frustrated with the husband.  She was upset because husband did not keep appointment with a therapist.  Patient's therapist and her husband therapist shared the office, she find out that therapist is going for vacation for one week however husband told that therapist is going for 5 weeks.  Overall patient is involved in her daily activities.  She is happy that her son is graduating tomorrow.  She is more social and denies any recent crying spells.  She denies any active or passive suicidal thoughts.  There were no delusion or any psychotic symptoms present at this time.  There were no tremors or shakes.  Her psychomotor activity is normal.  She's not drinking or using any illegal substance.  Suicidal Ideation: No Plan Formed: No Patient has means to carry out plan: No  Homicidal Ideation: No Plan Formed: No Patient has means to carry out plan: No  Review of Systems  Constitutional: Negative.   Eyes: Negative.   Respiratory: Negative.    Cardiovascular: Negative.   Musculoskeletal: Negative.   Neurological: Negative.   Psychiatric/Behavioral: The patient is nervous/anxious.    Psychiatric: Agitation: No Hallucination: No Depressed Mood: No Insomnia: No Hypersomnia: No Altered Concentration: No Feels Worthless: No Grandiose Ideas: No Belief In Special Powers: No New/Increased Substance Abuse: No Compulsions: No  Neurologic: Headache: No Seizure: No Paresthesias: No  Past psychiatric history. Patient has been seeing in this office since 2009.  She was admitted at [facility] due to suicidal attempt when she took overdose on her medication.  At that time she was taking Prozac which was stopped.    Medical history . Patient has history of bilateral mastectomy, hypothyroidism , anemia and heart murmur.  She see Dr. Johnn Hai at [facility] physician.  Her recent blood work shows hyperlipidemia.    History: Patient lives with her husband.  Outpatient Encounter Prescriptions as of 08/03/2012  Medication Sig Dispense Refill  . mirtazapine (REMERON) 15 MG tablet Take 1 tablet (15 mg total) by mouth at bedtime.  30 tablet  0  . QUEtiapine (SEROQUEL) 100 MG tablet Take 1 tablet (100 mg total) by mouth at bedtime.  30 tablet  0  . [DISCONTINUED] mirtazapine (REMERON) 15 MG tablet Take 1 tablet (15 mg total) by mouth at bedtime.  30 tablet  0  . [DISCONTINUED] QUEtiapine (SEROQUEL) 100 MG tablet Take 150 mg by mouth at bedtime.       No facility-administered encounter medications on file as of 08/03/2012.    Past  Psychiatric History/Hospitalization(s): Anxiety: Yes Bipolar Disorder: No Depression: Yes Mania: No Psychosis: Yes Schizophrenia: No Personality Disorder: No Hospitalization for psychiatric illness: Yes History of Electroconvulsive Shock Therapy: No Prior Suicide Attempts: Yes  Physical Exam: Constitutional:  BP 121/79  Pulse 96  Ht 5' 4.75" (1.645 m)  Wt 178 lb 9.6 oz (81.012 kg)   BMI 29.94 kg/m2  General Appearance: well nourished patient is emotional.  Musculoskeletal: Strength & Muscle Tone: within normal limits Gait & Station: normal Patient leans: N/A  Psychiatric: Speech (describe rate, volume, coherence, spontaneity, and abnormalities if any): Clear and coherent.  Thought Process (describe rate, content, abstract reasoning, and computation): Logical and goal-directed.  Associations: Coherent, Relevant and Intact  Thoughts: normal  Mental Status: Orientation: oriented to person, place, time/date and situation Mood & Affect: anxiety Attention Span & Concentration: Fair  Medical Decision Making (Choose Three): Established Problem, Stable/Improving (1), Review of Psycho-Social Stressors (1), Review of Last Therapy Session (1), Review of Medication Regimen & Side Effects (2) and Review of New Medication or Change in Dosage (2)  Assessment: Axis I: Maj. depressive disorder  Axis II: Deferred  Axis III: See medical history  Axis IV: Mild  Axis V: 65-70   Plan:  Reassurance given.  Explain in detail the risk and benefits of medication.  Recommend to try Seroquel 100 mg at bedtime so less complain of sedation and feeling tired.  Also recommend if she does not any communication with the husband related to her psychiatric care then she should put her husband do not call list.  Recommend to see therapist and encourage coping mechanism.  I also encourage her to involve her husband in her therapy and see marriage counselor.  .  Continue Remeron at present dose.  Recommend to cause back it is a question or concern if she would worsening of the symptom.  I will see her again in 4 weeks.  Time spent 30 minutes.  More than 50% of the time spent in psychoeducation, counseling and coordination of care.  ARFEEN,SYED T., MD 08/03/2012

## 2012-08-29 ENCOUNTER — Telehealth (HOSPITAL_COMMUNITY): Payer: Self-pay

## 2012-08-29 NOTE — Telephone Encounter (Signed)
8:36am 08/29/12 Patient called need to speak with you about her medication having some bad days in reference to her SEROQUEL.. - please call ASAP.Marland KitchenMarguerite Olea

## 2012-08-29 NOTE — Telephone Encounter (Signed)
Wants to discuss increasing or changing anti-depressant with MD.  Husband has said if she doesn't get this sorted out and her medication changed, she can't go to beach next week with family.  Per pt, most days are ok, bu has had two episodes of being really down. Has had one bad day and one really bad day. On July 4th, spent the day crying and was not able to attend events with family as planned. Was okay on July 5th, went out with friends/family, and had been okay since. This happened since Seroquel was decreased, but she does not want to increase Seroquel due to weight.  Patient wants to discuss with Dr.Arfeen as soon as possible. Has asked for appt tomorrow if any one cancels.

## 2012-08-30 ENCOUNTER — Telehealth (HOSPITAL_COMMUNITY): Payer: Self-pay | Admitting: Psychiatry

## 2012-08-30 NOTE — Telephone Encounter (Signed)
Spoke to patient.  Patient admitted since she cut down Seroquel there are few days when she feels bad and depressed.  She's wondering if she go with her husband's family to a beach visit.  I recommend if she continues to have bad days than she should go back to Seroquel 150 mg daily.  Patient acknowledged and agreed.

## 2012-08-31 ENCOUNTER — Ambulatory Visit (HOSPITAL_COMMUNITY): Payer: Self-pay | Admitting: Psychiatry

## 2012-09-13 ENCOUNTER — Encounter (HOSPITAL_COMMUNITY): Payer: Self-pay | Admitting: Psychiatry

## 2012-09-13 ENCOUNTER — Ambulatory Visit (INDEPENDENT_AMBULATORY_CARE_PROVIDER_SITE_OTHER): Payer: Commercial Managed Care - PPO | Admitting: Psychiatry

## 2012-09-13 VITALS — BP 132/88 | HR 82 | Ht 64.75 in

## 2012-09-13 DIAGNOSIS — F331 Major depressive disorder, recurrent, moderate: Secondary | ICD-10-CM

## 2012-09-13 MED ORDER — QUETIAPINE FUMARATE 100 MG PO TABS: ORAL_TABLET | ORAL | Status: DC

## 2012-09-13 MED ORDER — BUPROPION HCL ER (XL) 150 MG PO TB24: 150.0000 mg | ORAL_TABLET | ORAL | Status: DC

## 2012-09-13 NOTE — Progress Notes (Signed)
[facility] [id number] Progress Note  Donna Black [id number] 47 y.o.  09/13/2012 5:08 PM  Chief Complaint:  Medication management and followup.     History of Present Illness: Patient is 47 year old Caucasian married female who came for her followup appointment.  Patient reported having the incident on July 4 when she felt very agitated and upset.  She was experiencing paranoid behavior from her husband.  Patient called [name] at that time and express her concern that she is not doing very well.  Her husband also called at that time and reported that patient will not go to the beach due to her paranoid behavior unless she takes more medication.  We have recommended to take Seroquel 150 mg however patient took one dose of 150 and now she is taking 100 mg at bedtime.  Patient appears tired today.  Patient continued to express very little trust on her husband.  Received a letter from her husband's therapist Ollen Gross concerning the patient is continued to engage in paranoia , consuming a bottle of wine every day and remains isolated.  However these other concerns patient's husband had when he saw his therapist and recommended her to provide information.  Patient denies all of the above.  Patient admitted having incident on July 4 but otherwise she is not feeling paranoid and sleeping better.  Patient believes that her husband is trying to leave her and making a case against her.  She also expressed concern that husband may get the child custody and blame her psychiatric illness for that .  Patient denies any agitation anger or any mood swing.  She denies any hallucination or any crying spells.  She denies any active or passive suicidal thoughts and homicidal thoughts.  She admitted some time being isolated and withdrawn.  She admitted some time decreased energy however no aggression or violence.  Patient told that her husband sister 2 years ago tried to get divorced but did not succeed.  Patient  became very surprised when I discussed the issue that he was in the letter .  Patient told [name] week her husband celebrated her birthday very well.  She has no clue that husband is so much concerned about her behavior.  Patient is even thinking to have a surprise birthday for her husband next week.  After reading the latter patient become very tearful however he assures given.  I explained that letter maybe attend long time ago when she had episode in July 4.  Patient acknowledged.  Patient is taking Seroquel 100 mg.  She is also taking Remeron which she believes causing tired and lack of energy.  She is seeing her therapist regularly.  She remembered having a good response with Wellbutrin in the past which was discontinued when she started intensive outpatient program.  She believes her thinking is much clearer.  She denies any trust issue with any other person in the family other than her husband.  She's not using any illegal substance but admitted to drinking on and off.  Suicidal Ideation: No Plan Formed: No Patient has means to carry out plan: No  Homicidal Ideation: No Plan Formed: No Patient has means to carry out plan: No  Review of Systems  Constitutional: Positive for malaise/fatigue.  Eyes: Negative.   Respiratory: Negative.   Cardiovascular: Negative.   Musculoskeletal: Negative.   Neurological: Negative.   Psychiatric/Behavioral: Positive for depression and substance abuse. Negative for suicidal ideas. The patient is nervous/anxious.    Psychiatric: Agitation: No Hallucination:  No Depressed Mood: No Insomnia: No Hypersomnia: No Altered Concentration: No Feels Worthless: No Grandiose Ideas: No Belief In Special Powers: No New/Increased Substance Abuse: No Compulsions: No  Neurologic: Headache: No Seizure: No Paresthesias: No  Past Medical History  Diagnosis Date  . Wears glasses   . Depression   . Cancer     bilateral breast cancer  . Heart murmur     told off and on  in past that she has had one  . Grave's disease     diagnosed about 7 yrs ago but after a year she has been doing well  . Anemia     when she was younger   She see Dr Johnn Hai at [facility].   Past psychiatric history. Patient has been seeing in this office since 2009.  She was admitted at [facility] due to suicidal attempt when she took overdose on her medication.  At that time she was taking Prozac which was stopped.    Social History: Patient lives with her husband.  Outpatient Encounter Prescriptions as of 09/13/2012  Medication Sig Dispense Refill  . QUEtiapine (SEROQUEL) 100 MG tablet Take 1 and half every night  45 tablet  0  . [DISCONTINUED] mirtazapine (REMERON) 15 MG tablet Take 1 tablet (15 mg total) by mouth at bedtime.  30 tablet  0  . [DISCONTINUED] QUEtiapine (SEROQUEL) 100 MG tablet Take 1 tablet (100 mg total) by mouth at bedtime.  30 tablet  0  . buPROPion (WELLBUTRIN XL) 150 MG 24 hr tablet Take 1 tablet (150 mg total) by mouth every morning.  21 tablet  0   No facility-administered encounter medications on file as of 09/13/2012.    Past Psychiatric History/Hospitalization(s): Anxiety: Yes Bipolar Disorder: No Depression: Yes Mania: No Psychosis: Yes Schizophrenia: No Personality Disorder: No Hospitalization for psychiatric illness: Yes History of Electroconvulsive Shock Therapy: No Prior Suicide Attempts: Yes  Physical Exam: Constitutional:  BP 132/88  Pulse 82  Ht 5' 4.75" (1.645 m)  General Appearance: well nourished but tired.  She was tearful when she is talking about her husband.  Musculoskeletal: Strength & Muscle Tone: within normal limits Gait & Station: normal Patient leans: N/A  Psychiatric: Speech (describe rate, volume, coherence, spontaneity, and abnormalities if any): Clear and coherent.  Thought Process (describe rate, content, abstract reasoning, and computation): Logical and  goal-directed.  Associations: Coherent, Relevant and Intact  Thoughts: normal  Mental Status: Orientation: oriented to person, place, time/date and situation Mood & Affect: anxiety Attention Span & Concentration: Fair  Medical Decision Making (Choose Three): Established Problem, Stable/Improving (1), New problem, with additional work up planned, Review of Psycho-Social Stressors (1), Established Problem, Worsening (2), Review of Last Therapy Session (1), Review of Medication Regimen & Side Effects (2) and Review of New Medication or Change in Dosage (2)  Assessment: Axis I: Maj. depressive disorder  Axis II: Deferred  Axis III: See medical history  Axis IV: Mild  Axis V: 65-70   Plan:  Patient denies any active or passive suicidal thoughts or homicidal thoughts at this time.  She became upset after she was explained her husband's concern which was written by his therapist.  Patient admitted cutting down her alcohol.  She also agreed to try Seroquel 150 mg at bedtime.  I will discontinue Remeron to reduced sedation and feeling tired in the morning.  We will try Wellbutrin XL 150 mg but she was taking in the past with good response.  Discussed safety  plan.  Patient told that she loves her son and will not do anything to harm herself.  However in case I recommend to call 911 or go to emergency room if she started to feel having any suicidal thoughts or homicidal thoughts.  Discussed in detail the risks and benefits of medication.  Recommend to see her therapist [name] for coping skills.  I will see her again in 3 weeks.  Time spent 30 minutes.  More than 50% of the time spent in psychoeducation, counseling and coordination of care.  Montague Corella T., MD 09/13/2012

## 2012-10-03 ENCOUNTER — Encounter (HOSPITAL_COMMUNITY): Payer: Self-pay | Admitting: Psychiatry

## 2012-10-03 ENCOUNTER — Ambulatory Visit (INDEPENDENT_AMBULATORY_CARE_PROVIDER_SITE_OTHER): Payer: Commercial Managed Care - PPO | Admitting: Psychiatry

## 2012-10-03 VITALS — BP 130/82 | HR 64 | Resp 12 | Ht 64.0 in | Wt 177.8 lb

## 2012-10-03 DIAGNOSIS — F329 Major depressive disorder, single episode, unspecified: Secondary | ICD-10-CM

## 2012-10-03 DIAGNOSIS — F331 Major depressive disorder, recurrent, moderate: Secondary | ICD-10-CM

## 2012-10-03 MED ORDER — QUETIAPINE FUMARATE 100 MG PO TABS: ORAL_TABLET | ORAL | Status: DC

## 2012-10-03 MED ORDER — BUPROPION HCL ER (XL) 300 MG PO TB24: 300.0000 mg | ORAL_TABLET | ORAL | Status: DC

## 2012-10-03 NOTE — Progress Notes (Signed)
[facility] [id number] Progress Note  Donna Black [id number] 47 y.o.  10/03/2012 5:07 PM  Chief Complaint:  Medication management and followup.     History of Present Illness: Patient is 47 year old Caucasian married female who came for her followup appointment.  Patient is doing better on Wellbutrin and feel more energy and less anxious and less emotional. She admitted few crying spells.  She is seeing therapist [name] regularly.  She is still has issues with her husband and she admitted that she cannot trust him.  However there has been no recent agitation anger or mood swing.  She reported that she sent an e-mail to her friends and family describing about her depression and write them for support group and walk in the morning.  This was suggested by her close friend and patient was very happy to get a positive response.  Patient told me when her husband liked this idea.  The patient admitted that she sometimes drinks alcohol however it has been cut down from the past.  She is taking Seroquel 150 mg at bedtime.  She is sleeping better.  She is wondering about Wellbutrin can be further increased.  She has better attention concentration and increased energy.  Suicidal Ideation: No Plan Formed: No Patient has means to carry out plan: No  Homicidal Ideation: No Plan Formed: No Patient has means to carry out plan: No  Review of Systems  Constitutional: Negative.   Eyes: Negative.   Respiratory: Negative.   Cardiovascular: Negative.   Musculoskeletal: Negative.   Neurological: Negative.   Psychiatric/Behavioral: Positive for substance abuse. Negative for suicidal ideas. The patient is nervous/anxious.    Psychiatric: Agitation: No Hallucination: No Depressed Mood: No Insomnia: No Hypersomnia: No Altered Concentration: No Feels Worthless: No Grandiose Ideas: No Belief In Special Powers: No New/Increased Substance Abuse: No Compulsions: No  Neurologic: Headache:  No Seizure: No Paresthesias: No  Past Medical History  Diagnosis Date  . Wears glasses   . Depression   . Cancer     bilateral breast cancer  . Heart murmur     told off and on in past that she has had one  . Grave's disease     diagnosed about 7 yrs ago but after a year she has been doing well  . Anemia     when she was younger   She see Dr Johnn Hai at [facility].   Past psychiatric history. Patient has been seeing in this office since 2009.  She was admitted at [facility] due to suicidal attempt when she took overdose on her medication.  At that time she was taking Prozac which was stopped.    Social History: Patient lives with her husband.  Outpatient Encounter Prescriptions as of 10/03/2012  Medication Sig Dispense Refill  . buPROPion (WELLBUTRIN XL) 300 MG 24 hr tablet Take 1 tablet (300 mg total) by mouth every morning.  30 tablet  0  . QUEtiapine (SEROQUEL) 100 MG tablet Take 1 and half every night  45 tablet  1  . [DISCONTINUED] buPROPion (WELLBUTRIN XL) 150 MG 24 hr tablet Take 1 tablet (150 mg total) by mouth every morning.  21 tablet  0  . [DISCONTINUED] QUEtiapine (SEROQUEL) 100 MG tablet Take 1 and half every night  45 tablet  0   No facility-administered encounter medications on file as of 10/03/2012.    Past Psychiatric History/Hospitalization(s): Anxiety: Yes Bipolar Disorder: No Depression: Yes Mania: No Psychosis: Yes Schizophrenia: No Personality  Disorder: No Hospitalization for psychiatric illness: Yes History of Electroconvulsive Shock Therapy: No Prior Suicide Attempts: Yes  Physical Exam: Constitutional:  BP 130/82  Pulse 64  Resp 12  Ht 5\' 4"  (1.626 m)  Wt 177 lb 12.8 oz (80.65 kg)  BMI 30.5 kg/m2  General Appearance: well nourished but tired.  She was tearful when she is talking about her husband.  Musculoskeletal: Strength & Muscle Tone: within normal limits Gait & Station: normal Patient leans:  N/A  Psychiatric: Speech (describe rate, volume, coherence, spontaneity, and abnormalities if any): Clear and coherent.  Thought Process (describe rate, content, abstract reasoning, and computation): Logical and goal-directed.  Associations: Coherent, Relevant and Intact  Thoughts: normal  Mental Status: Orientation: oriented to person, place, time/date and situation Mood & Affect: anxiety Attention Span & Concentration: Fair  Medical Decision Making (Choose Three): Established Problem, Stable/Improving (1), Review of Psycho-Social Stressors (1), Review of Last Therapy Session (1), Review of Medication Regimen & Side Effects (2) and Review of New Medication or Change in Dosage (2)  Assessment: Axis I: Maj. depressive disorder  Axis II: Deferred  Axis III: See medical history  Axis IV: Mild  Axis V: 65-70   Plan:  I have a long discussion with the patient regarding her alcohol use and current medication.  She is showing much improvement with increase Seroquel and Wellbutrin.  She is no longer taking Remeron.  We will try increasing the Wellbutrin to 300 mg.  I strongly suggested to stop the drinking because of interaction of psychotropic medication .  Patient understand and agree with the plan.  Recommend to see therapist for coping and social skills.  Recommend to call us back if she has any questions about Wellbutrin dose.  We will provide Wellbutrin XL 300 mg samples .  Followup in 6 weeks.  Time spent 30 minutes.  More than 50% of the time spent in psychoeducation, counseling and coordination of care.  Geovana Gebel T., MD 10/03/2012

## 2012-10-04 ENCOUNTER — Ambulatory Visit (HOSPITAL_COMMUNITY): Payer: Self-pay | Admitting: Psychiatry

## 2012-10-09 ENCOUNTER — Inpatient Hospital Stay (HOSPITAL_COMMUNITY)
Admission: EM | Admit: 2012-10-09 | Discharge: 2012-10-11 | DRG: 918 | Disposition: A | Discharge status: Other Acute Inpatient Hospital | Payer: Commercial Managed Care - PPO | Attending: Internal Medicine | Admitting: Internal Medicine

## 2012-10-09 ENCOUNTER — Telehealth (HOSPITAL_COMMUNITY): Payer: Self-pay

## 2012-10-09 ENCOUNTER — Encounter (HOSPITAL_COMMUNITY): Payer: Self-pay | Admitting: Emergency Medicine

## 2012-10-09 DIAGNOSIS — F331 Major depressive disorder, recurrent, moderate: Secondary | ICD-10-CM

## 2012-10-09 DIAGNOSIS — F329 Major depressive disorder, single episode, unspecified: Secondary | ICD-10-CM | POA: Diagnosis present

## 2012-10-09 DIAGNOSIS — E876 Hypokalemia: Secondary | ICD-10-CM | POA: Diagnosis present

## 2012-10-09 DIAGNOSIS — F22 Delusional disorders: Secondary | ICD-10-CM | POA: Diagnosis present

## 2012-10-09 DIAGNOSIS — T394X2A Poisoning by antirheumatics, not elsewhere classified, intentional self-harm, initial encounter: Secondary | ICD-10-CM | POA: Diagnosis present

## 2012-10-09 DIAGNOSIS — Y92009 Unspecified place in unspecified non-institutional (private) residence as the place of occurrence of the external cause: Secondary | ICD-10-CM

## 2012-10-09 DIAGNOSIS — T50901A Poisoning by unspecified drugs, medicaments and biological substances, accidental (unintentional), initial encounter: Secondary | ICD-10-CM

## 2012-10-09 DIAGNOSIS — T391X1A Poisoning by 4-Aminophenol derivatives, accidental (unintentional), initial encounter: Principal | ICD-10-CM | POA: Diagnosis present

## 2012-10-09 DIAGNOSIS — Z853 Personal history of malignant neoplasm of breast: Secondary | ICD-10-CM

## 2012-10-09 DIAGNOSIS — T50902A Poisoning by unspecified drugs, medicaments and biological substances, intentional self-harm, initial encounter: Secondary | ICD-10-CM

## 2012-10-09 DIAGNOSIS — T50902D Poisoning by unspecified drugs, medicaments and biological substances, intentional self-harm, subsequent encounter: Secondary | ICD-10-CM

## 2012-10-09 DIAGNOSIS — E872 Acidosis, unspecified: Secondary | ICD-10-CM | POA: Diagnosis present

## 2012-10-09 DIAGNOSIS — F339 Major depressive disorder, recurrent, unspecified: Secondary | ICD-10-CM | POA: Diagnosis present

## 2012-10-09 DIAGNOSIS — Z720 Tobacco use: Secondary | ICD-10-CM

## 2012-10-09 DIAGNOSIS — Z87891 Personal history of nicotine dependence: Secondary | ICD-10-CM

## 2012-10-09 HISTORY — DX: Other complications of anesthesia, initial encounter: T88.59XA

## 2012-10-09 HISTORY — DX: Adverse effect of unspecified anesthetic, initial encounter: T41.45XA

## 2012-10-09 LAB — CBC
HCT: 40.5 % (ref 36.0–46.0)
MCH: 33.9 pg (ref 26.0–34.0)
MCV: 95.3 fL (ref 78.0–100.0)
Platelets: 255 10*3/uL (ref 150–400)
RBC: 4.25 MIL/uL (ref 3.87–5.11)

## 2012-10-09 LAB — COMPREHENSIVE METABOLIC PANEL
AST: 24 U/L (ref 0–37)
BUN: 11 mg/dL (ref 6–23)
CO2: 16 mEq/L — ABNORMAL LOW (ref 19–32)
Calcium: 9.2 mg/dL (ref 8.4–10.5)
Creatinine, Ser: 0.61 mg/dL (ref 0.50–1.10)
GFR calc Af Amer: >90 mL/min (ref >90)
GFR calc non Af Amer: >90 mL/min (ref >90)
Glucose, Bld: 91 mg/dL (ref 70–99)

## 2012-10-09 LAB — HEPATIC FUNCTION PANEL
ALT: 14 U/L (ref 0–35)
AST: 17 U/L (ref 0–37)
Total Protein: 6.5 g/dL (ref 6.0–8.3)

## 2012-10-09 LAB — ACETAMINOPHEN LEVEL: Acetaminophen (Tylenol), Serum: 136.3 ug/mL — ABNORMAL HIGH (ref 10–30)

## 2012-10-09 LAB — ETHANOL: Alcohol, Ethyl (B): 55 mg/dL — ABNORMAL HIGH (ref 0–11)

## 2012-10-09 LAB — RAPID URINE DRUG SCREEN, HOSP PERFORMED
Benzodiazepines: NOT DETECTED
Cocaine: NOT DETECTED
Opiates: NOT DETECTED

## 2012-10-09 LAB — URINALYSIS, ROUTINE W REFLEX MICROSCOPIC
Glucose, UA: NEGATIVE mg/dL
Hgb urine dipstick: NEGATIVE
Ketones, ur: 40 mg/dL — AB
Specific Gravity, Urine: >1.046 — ABNORMAL HIGH (ref 1.005–1.030)
pH: 5 (ref 5.0–8.0)

## 2012-10-09 LAB — PROTIME-INR: Prothrombin Time: 12.9 seconds (ref 11.6–15.2)

## 2012-10-09 LAB — SALICYLATE LEVEL: Salicylate Lvl: <2 mg/dL — ABNORMAL LOW (ref 2.8–20.0)

## 2012-10-09 MED ORDER — ACETYLCYSTEINE 20 % IN SOLN
70.0000 mg/kg | RESPIRATORY_TRACT | Status: DC
  Filled 2012-10-09 (×4): qty 30

## 2012-10-09 MED ORDER — BUPROPION HCL ER (XL) 300 MG PO TB24
300.0000 mg | ORAL_TABLET | Freq: Every day | ORAL | Status: DC
  Filled 2012-10-09: qty 1

## 2012-10-09 MED ORDER — ACETYLCYSTEINE LOAD VIA INFUSION
150.0000 mg/kg | Freq: Once | INTRAVENOUS | Status: AC
  Administered 2012-10-09: 11535 mg via INTRAVENOUS
  Filled 2012-10-09: qty 289

## 2012-10-09 MED ORDER — POTASSIUM CHLORIDE CRYS ER 20 MEQ PO TBCR
40.0000 meq | EXTENDED_RELEASE_TABLET | Freq: Once | ORAL | Status: AC
  Administered 2012-10-09: 40 meq via ORAL
  Filled 2012-10-09: qty 2

## 2012-10-09 MED ORDER — ONDANSETRON HCL 4 MG/2ML IJ SOLN
4.0000 mg | Freq: Four times a day (QID) | INTRAMUSCULAR | Status: DC | PRN
  Administered 2012-10-09: 4 mg via INTRAVENOUS
  Filled 2012-10-09: qty 2

## 2012-10-09 MED ORDER — DEXTROSE 5 % IV SOLN
15.0000 mg/kg/h | INTRAVENOUS | Status: DC
  Administered 2012-10-09: 15 mg/kg/h via INTRAVENOUS
  Filled 2012-10-09: qty 200

## 2012-10-09 MED ORDER — BUPROPION HCL ER (XL) 300 MG PO TB24
300.0000 mg | ORAL_TABLET | ORAL | Status: DC
  Filled 2012-10-09: qty 1

## 2012-10-09 MED ORDER — ENOXAPARIN SODIUM 40 MG/0.4ML ~~LOC~~ SOLN
40.0000 mg | SUBCUTANEOUS | Status: DC
  Administered 2012-10-09 – 2012-10-10 (×2): 40 mg via SUBCUTANEOUS
  Filled 2012-10-09 (×4): qty 0.4

## 2012-10-09 MED ORDER — POTASSIUM CHLORIDE CRYS ER 20 MEQ PO TBCR: 40.0000 meq | EXTENDED_RELEASE_TABLET | Freq: Once | ORAL | Status: DC

## 2012-10-09 MED ORDER — SODIUM CHLORIDE 0.9 % IV SOLN
INTRAVENOUS | Status: DC
  Administered 2012-10-09 – 2012-10-10 (×3): via INTRAVENOUS

## 2012-10-09 MED ORDER — ACETYLCYSTEINE 20 % IN SOLN
140.0000 mg/kg | Freq: Once | RESPIRATORY_TRACT | Status: DC
  Filled 2012-10-09: qty 60

## 2012-10-09 MED ORDER — SODIUM CHLORIDE 0.9 % IV BOLUS (SEPSIS)
1000.0000 mL | Freq: Once | INTRAVENOUS | Status: AC
  Administered 2012-10-09: 1000 mL via INTRAVENOUS

## 2012-10-09 NOTE — ED Notes (Addendum)
Pt reported to MD that she attempted to overdose on acetaminophen last night and sts she took 10 Extra Strength Tylenol.  After verifying ingestion amounts with Pt, she sts that she vomited after taking all of the medication.

## 2012-10-09 NOTE — ED Provider Notes (Signed)
CSN: [id number]     Arrival date & time 10/09/12  1407 History     First MD Initiated Contact with Patient 10/09/12 1453     Chief Complaint  Patient presents with  . Suicide Attempt   (Consider location/radiation/quality/duration/timing/severity/associated sxs/prior Treatment) HPI Comments: Pt is a 47 y/o female with hx of depression and prior suicide attempt who states that she has had increased depression and is upset b/c of interactions with husband at home - she states that she is verbally abusive but states I deserve it so it's not a big deal.  She does have ongoing depression and states that she was trying to "end it all" because she's a "burden".  She tried to see her therapist this AM but states it didn't help.  She has seen Dr. Lolly Mustache in the past week.  Sx are constant, severe and nothing makes this better.  She took tylenol last night but stopped her OD b/c she wanted to see therapist this AM.    She took another OD at ~ 1 PM today which consisted of 600mg  of seroquel and another 5000 mg of tylenol.  She denies any other coingestants.  Seizures / cerebral deprsesion - benzos for seizures, QTC > 500 give mag, keep K > 4.2 and Ca > 9 according to poison control.  The history is provided by the patient, the spouse and medical records.    Past Medical History  Diagnosis Date  . Wears glasses   . Depression   . Cancer     bilateral breast cancer  . Heart murmur     told off and on in past that she has had one  . Grave's disease     diagnosed about 7 yrs ago but after a year she has been doing well  . Anemia     when she was younger  . Complication of anesthesia     ?, heart rate dropped w/wisdom teeth   Past Surgical History  Procedure Laterality Date  . Cholecystectomy    . Wisdom tooth extraction    . Breast fibroadenoma surgery  1987, 2000    bilateral  . Cesarean section      2001  . Breast reconstruction  05/12/2011    Procedure: BREAST RECONSTRUCTION;  Surgeon:  Etter Sjogren, MD;  Location: [facility] OR;  Service: Plastics;  Laterality: Bilateral;  PLACEMENT OF BILATERAL TISSUE EXPANDERS WITH POSSIBLE USE OF HD FLEX   Family History  Problem Relation Age of Onset  . Breast cancer Maternal Grandmother   . Cancer Maternal Grandmother     breast  . Heart disease Mother   . Stroke Mother   . Heart disease Father    History  Substance Use Topics  . Smoking status: Former Smoker -- 0.50 packs/day    Types: Cigarettes    Quit date: 04/05/2011  . Smokeless tobacco: Never Used  . Alcohol Use: 1.2 oz/week    2 Glasses of wine per week     Comment: sometimes daily or can just be weekends   OB History   Grav Para Term Preterm Abortions TAB SAB Ect Mult Living                 Review of Systems  All other systems reviewed and are negative.    Allergies  Penicillins  Home Medications   No current outpatient prescriptions on file. BP 136/115  Pulse 75  Temp(Src) 98.4 F (36.9 C) (Oral)  Resp 18  Ht 5'  4" (1.626 m)  Wt 171 lb 11.8 oz (77.9 kg)  BMI 29.46 kg/m2  SpO2 98%  LMP 09/25/2012 Physical Exam  Nursing note and vitals reviewed. Constitutional: She appears well-developed and well-nourished. No distress.  HENT:  Head: Normocephalic and atraumatic.  Mouth/Throat: Oropharynx is clear and moist. No oropharyngeal exudate.  Eyes: Conjunctivae and EOM are normal. Pupils are equal, round, and reactive to light. Right eye exhibits no discharge. Left eye exhibits no discharge. No scleral icterus.  Neck: Normal range of motion. Neck supple. No JVD present. No thyromegaly present.  Cardiovascular: Normal rate, regular rhythm, normal heart sounds and intact distal pulses.  Exam reveals no gallop and no friction rub.   No murmur heard. Pulmonary/Chest: Effort normal and breath sounds normal. No respiratory distress. She has no wheezes. She has no rales.  Abdominal: Soft. Bowel sounds are normal. She exhibits no distension and no mass. There is  tenderness ( RUQ mild ttp, no HSM).  Musculoskeletal: Normal range of motion. She exhibits no edema and no tenderness.  Lymphadenopathy:    She has no cervical adenopathy.  Neurological: She is alert. Coordination normal.  Skin: Skin is warm and dry. No rash noted. No erythema.  Psychiatric:  The patient appears sand, depressed affect, admits to ongoing suicidality, no hallucinations    ED Course   Procedures (including critical care time)  Labs Reviewed  COMPREHENSIVE METABOLIC PANEL - Abnormal; Notable for the following:    Potassium 3.2 (*)    CO2 16 (*)    All other components within normal limits  URINALYSIS, ROUTINE W REFLEX MICROSCOPIC - Abnormal; Notable for the following:    Specific Gravity, Urine >1.046 (*)    Bilirubin Urine SMALL (*)    Ketones, ur 40 (*)    All other components within normal limits  ACETAMINOPHEN LEVEL - Abnormal; Notable for the following:    Acetaminophen (Tylenol), Serum 161.0 (*)    All other components within normal limits  SALICYLATE LEVEL - Abnormal; Notable for the following:    Salicylate Lvl <2.0 (*)    All other components within normal limits  ETHANOL - Abnormal; Notable for the following:    Alcohol, Ethyl (B) 55 (*)    All other components within normal limits  COMPREHENSIVE METABOLIC PANEL - Abnormal; Notable for the following:    Potassium 3.2 (*)    CO2 17 (*)    Calcium 8.0 (*)    Total Protein 5.9 (*)    Albumin 2.9 (*)    Alkaline Phosphatase 37 (*)    All other components within normal limits  PROTIME-INR - Abnormal; Notable for the following:    Prothrombin Time 15.6 (*)    All other components within normal limits  LACTIC ACID, PLASMA - Abnormal; Notable for the following:    Lactic Acid, Venous 2.5 (*)    All other components within normal limits  ACETAMINOPHEN LEVEL - Abnormal; Notable for the following:    Acetaminophen (Tylenol), Serum 136.3 (*)    All other components within normal limits  HEPATIC FUNCTION  PANEL - Abnormal; Notable for the following:    Albumin 3.1 (*)    Total Bilirubin 0.2 (*)    All other components within normal limits  MRSA PCR SCREENING  CBC  URINE RAPID DRUG SCREEN (HOSP PERFORMED)  PROTIME-INR  ACETAMINOPHEN LEVEL  CBC  PROTIME-INR  COMPREHENSIVE METABOLIC PANEL  ACETAMINOPHEN LEVEL   No results found. 1. Tobacco abuse   2. Tylenol poisoning, initial encounter  3. Suicidal overdose, initial encounter   4. Major depressive disorder   5. Tylenol poisoning, subsequent encounter     MDM  The patient appears very depressed, she is definitely at risk for ongoing suicide attempts and will need to be hospitalized. At this time she is now here on her own will, she will need to be involuntary committed should she choose to leave. We'll involve psychiatric services at this time. She will also need medical admission for her Tylenol overdose which has been ongoing and according to poison control would have some cumulative effect. NAC will be given.  ED ECG REPORT  I personally interpreted this EKG   Date: 10/10/2012   Rate: 103  Rhythm: sinus tachycardia  QRS Axis: normal  Intervals: normal  ST/T Wave abnormalities: nonspecific T wave changes  Conduction Disutrbances:none  Narrative Interpretation:   Old EKG Reviewed: Compared with 10/14/2009, no significant changes  The patient is amenable to admission to the hospital voluntarily at this point. She has been informed that should she choose to leave she'll need to be involuntarily committed. Discussed with hospitalist who will admit. The patient is not tolerating oral medications thus intravenous meds will have to be given for the Tylenol overdose  Vida Roller, MD 10/10/12 1615

## 2012-10-09 NOTE — Telephone Encounter (Signed)
Spoke to [employer] and explain about patient resistance for increasing medication and in patient hospitalization. However if husband feel danger to self and others than should commit her with IVC. Kendal Hymen will contact husband and we will try to give patient earlier appointment.

## 2012-10-09 NOTE — ED Notes (Addendum)
Pt's husband sts Pt had an appointment with her counselor this morning and "it didn't go well."  Sts the counselor suggested the Pt voluntarily commit herself.  Sts he called [facility], asked if she would be admitted and was told "probably not."  Psychiatrist suggested taking out IVC papers.  Sts when he went to talk to the Pt about his findings, he found her on the floor.  Sts she stopped taking her medication about 4 months ago.

## 2012-10-09 NOTE — Progress Notes (Signed)
MEDICATION RELATED CONSULT NOTE - INITIAL   Pharmacy Consult for ACETYLCYSTEINE  Indication: Acetaminophen overdose  Allergies  Allergen Reactions  . Penicillins Other (See Comments)    Did not work as a child    Patient Measurements:   Adjusted Body Weight:   Vital Signs: Temp: 97.5 F (36.4 C) (08/18 1415) Temp src: Oral (08/18 1415) BP: 117/65 mmHg (08/18 1500) Pulse Rate: 101 (08/18 1500) Intake/Output from previous day:   Intake/Output from this shift:    Labs: No results found for this basename: WBC, HGB, HCT, PLT, APTT, CREATININE, LABCREA, CREATININE, CREAT24HRUR, MG, PHOS, ALBUMIN, PROT, ALBUMIN, AST, ALT, ALKPHOS, BILITOT, BILIDIR, IBILI,  in the last 72 hours The CrCl is unknown because both a height and weight (above a minimum accepted value) are required for this calculation.   Microbiology: No results found for this or any previous visit (from the past 720 hour(s)).  Medical History: Past Medical History  Diagnosis Date  . Wears glasses   . Depression   . Cancer     bilateral breast cancer  . Heart murmur     told off and on in past that she has had one  . Grave's disease     diagnosed about 7 yrs ago but after a year she has been doing well  . Anemia     when she was younger   Assessment: 66 yoF with hx depression and prior suicide attempt admitted for drug overdose 8/18 from seroquel and acetaminophen 5000mg .  Pharmacy consulted to dose acetylcysteine.  Per MD, oral acetylcysteine will be tried first.  [place] poison control has been called already.    Updated weight: 77.1kg  The following labs are pending: Ethanol Salicylate lvl Acetaminophen lvl CMET CBC Urine drug screen  Plan:  1.  Acetylcysteine PO 140mg /kg x 1, then 70mg /kg q 4 hours 2.  F/u pending labs and ensure that LFTs, PT/INR, BMET, and additional acetaminophen levels ordered as appropriate.    Haynes Hoehn, PharmD 10/09/2012, 3:45 PM  Pager: [phone]

## 2012-10-09 NOTE — ED Notes (Addendum)
Patient allowed to use phone to call husband per RN.

## 2012-10-09 NOTE — ED Notes (Signed)
MD at bedside. 

## 2012-10-09 NOTE — ED Notes (Addendum)
Husband found wife laying in workshop floor curled up, states he took 600 mg of syroquel and 250 mg of acetaminophen. Had previous attempt 3 years ago.

## 2012-10-09 NOTE — Progress Notes (Signed)
Patient does not feel like signing up for My Chart at present time. Briscoe Burns BSN, RN-BC Admissions RN  10/09/2012 5:14 PM

## 2012-10-09 NOTE — Progress Notes (Signed)
Ur completed     

## 2012-10-09 NOTE — H&P (Signed)
[facility] History and Physical  Donna Black UJW:[id number] DOB: January 19, 1966 DOA: 10/09/2012  Referring physician: EDP PCP: Emeterio Reeve, MD   Chief Complaint: overdose HPI: Donna Black is a 47 y.o. female with h/o Depression, prior suicide attempt, has been increasingly depressed recently due to her home environment, went to church yesterday evening felt better for a little while and then came back home, got a call from her brother and daughter, this made her upset, then ingested a handful of Tylenol ES, wine and Seroquel. This afternoon she ingested 3 handfuls of Tylenol, 6 tablets of Seroquel and was found her laying on the workshop floor curled up and stating that she had overdosed.   Review of Systems: The patient denies anorexia, fever, weight loss,, vision loss, decreased hearing, hoarseness, chest pain, syncope, dyspnea on exertion, peripheral edema, balance deficits, hemoptysis, abdominal pain, melena, hematochezia, severe indigestion/heartburn, hematuria, incontinence, genital sores, muscle weakness, suspicious skin lesions, transient blindness, difficulty walking, depression, unusual weight change, abnormal bleeding, enlarged lymph nodes, angioedema, and breast masses.    Past Medical History  Diagnosis Date  . Wears glasses   . Depression   . Cancer     bilateral breast cancer  . Heart murmur     told off and on in past that she has had one  . Grave's disease     diagnosed about 7 yrs ago but after a year she has been doing well  . Anemia     when she was younger  . Complication of anesthesia     ?, heart rate dropped w/wisdom teeth   Past Surgical History  Procedure Laterality Date  . Cholecystectomy    . Wisdom tooth extraction    . Breast fibroadenoma surgery  1987, 2000    bilateral  . Cesarean section      2001  . Breast reconstruction  05/12/2011    Procedure: BREAST RECONSTRUCTION;  Surgeon: Etter Sjogren, MD;  Location: [facility] OR;  Service:  Plastics;  Laterality: Bilateral;  PLACEMENT OF BILATERAL TISSUE EXPANDERS WITH POSSIBLE USE OF HD FLEX   Social History:  reports that she quit smoking about 18 months ago. Her smoking use included Cigarettes. She smoked 0.50 packs per day. She has never used smokeless tobacco. She reports that she drinks about 1.2 ounces of alcohol per week. She reports that she does not use illicit drugs. Lives at home with husband and son   Allergies  Allergen Reactions  . Penicillins Other (See Comments)    Did not work as a child    Family History  Problem Relation Age of Onset  . Breast cancer Maternal Grandmother   . Cancer Maternal Grandmother     breast  . Heart disease Mother   . Stroke Mother   . Heart disease Father     Prior to Admission medications   Medication Sig Start Date End Date Taking? Authorizing Provider  buPROPion (WELLBUTRIN XL) 300 MG 24 hr tablet Take 1 tablet (300 mg total) by mouth every morning. 10/03/12 10/03/13 Yes Cleotis Nipper, MD  QUEtiapine (SEROQUEL) 100 MG tablet 150 mg at bedtime. Take 1 and half every night 10/03/12  Yes Cleotis Nipper, MD   Physical Exam: Filed Vitals:   10/09/12 1630  BP: 117/66  Pulse: 102  Temp:   Resp: 19     General:  AAOx3, appearing  HEENT: PERRLA  Cardiovascular: S1S2/RRR  Respiratory: CTAB  Abdomen: soft, Nt, BS present  Skin: no rashes   Musculoskeletal: S1S2/RRR  Psychiatric: upset, depressed Neurologic: non focal Labs on Admission:  Basic Metabolic Panel:  Recent Labs Lab 10/09/12 1439  NA 137  K 3.2*  CL 100  CO2 16*  GLUCOSE 91  BUN 11  CREATININE 0.61  CALCIUM 9.2   Liver Function Tests:  Recent Labs Lab 10/09/12 1439  AST 24  ALT 15  ALKPHOS 48  BILITOT 0.3  PROT 7.2  ALBUMIN 3.9   No results found for this basename: LIPASE, AMYLASE,  in the last 168 hours No results found for this basename: AMMONIA,  in the last 168 hours CBC:  Recent Labs Lab 10/09/12 1439  WBC 6.4  HGB 14.4   HCT 40.5  MCV 95.3  PLT 255   Cardiac Enzymes: No results found for this basename: CKTOTAL, CKMB, CKMBINDEX, TROPONINI,  in the last 168 hours  BNP (last 3 results) No results found for this basename: PROBNP,  in the last 8760 hours CBG: No results found for this basename: GLUCAP,  in the last 168 hours  Radiological Exams on Admission: No results found.  EKG: Independently reviewed. No acute St T wave changes  Assessment/Plan  1. Tylenol Poisoning - Start Mucomyst IV per Pharmacy protocol - Repeat LFTS this am and in am - Repeat tylenol level in am - D/w Poison control, they recommended repeat LFTS/INR/Tylenol 1 hours before stopping mucomist and if LFTs trend up may need another round of IV mucomist -Poison Control: [phone]  2. Suicide attempt -sitter at bedside -Psychiatry consultation  3. Metabolic acidosis -check lactic acid, urine ketones elevated,   4. Hypokalemia -replace  Code Status:Full Code Family Communication: none at bedside Disposition Plan: admit to Step Down  Time spent: [facility] Pager [phone] If 7PM-7AM, please contact night-coverage [email or web address] Password [email or web address] 10/09/2012, 5:20 PM

## 2012-10-09 NOTE — ED Notes (Signed)
Spoke to [employer], with [employer]- Recommendations: -Mucomyst -Cardiac monitoring: watch for prolonged QT & QTC -24 observation -Magnesium level

## 2012-10-09 NOTE — ED Notes (Signed)
Spoke with Danielle in lab and confirmed blue tube is down there and still good.

## 2012-10-09 NOTE — ED Notes (Signed)
Pt. Belongings are in locker 27. 

## 2012-10-10 DIAGNOSIS — T50902A Poisoning by unspecified drugs, medicaments and biological substances, intentional self-harm, initial encounter: Secondary | ICD-10-CM

## 2012-10-10 DIAGNOSIS — T50901A Poisoning by unspecified drugs, medicaments and biological substances, accidental (unintentional), initial encounter: Secondary | ICD-10-CM

## 2012-10-10 DIAGNOSIS — T391X1A Poisoning by 4-Aminophenol derivatives, accidental (unintentional), initial encounter: Principal | ICD-10-CM

## 2012-10-10 DIAGNOSIS — F17200 Nicotine dependence, unspecified, uncomplicated: Secondary | ICD-10-CM

## 2012-10-10 LAB — PROTIME-INR
INR: 1.17 (ref 0.00–1.49)
Prothrombin Time: 15.6 seconds — ABNORMAL HIGH (ref 11.6–15.2)

## 2012-10-10 LAB — COMPREHENSIVE METABOLIC PANEL
ALT: 16 U/L (ref 0–35)
AST: 15 U/L (ref 0–37)
Albumin: 2.9 g/dL — ABNORMAL LOW (ref 3.5–5.2)
Alkaline Phosphatase: 37 U/L — ABNORMAL LOW (ref 39–117)
Alkaline Phosphatase: 39 U/L (ref 39–117)
CO2: 18 mEq/L — ABNORMAL LOW (ref 19–32)
Calcium: 8 mg/dL — ABNORMAL LOW (ref 8.4–10.5)
Chloride: 106 mEq/L (ref 96–112)
Creatinine, Ser: 0.56 mg/dL (ref 0.50–1.10)
GFR calc Af Amer: >90 mL/min (ref >90)
GFR calc non Af Amer: >90 mL/min (ref >90)
Potassium: 3.2 mEq/L — ABNORMAL LOW (ref 3.5–5.1)
Potassium: 3.9 mEq/L (ref 3.5–5.1)
Sodium: 135 mEq/L (ref 135–145)
Total Bilirubin: 0.3 mg/dL (ref 0.3–1.2)
Total Protein: 5.9 g/dL — ABNORMAL LOW (ref 6.0–8.3)

## 2012-10-10 LAB — CBC
HCT: 39.4 % (ref 36.0–46.0)
Hemoglobin: 13.5 g/dL (ref 12.0–15.0)
MCH: 32.8 pg (ref 26.0–34.0)
MCHC: 34.3 g/dL (ref 30.0–36.0)
RBC: 4.11 MIL/uL (ref 3.87–5.11)

## 2012-10-10 LAB — ACETAMINOPHEN LEVEL: Acetaminophen (Tylenol), Serum: <15 ug/mL (ref 10–30)

## 2012-10-10 MED ORDER — BUPROPION HCL ER (XL) 150 MG PO TB24
150.0000 mg | ORAL_TABLET | Freq: Every day | ORAL | Status: DC
  Administered 2012-10-11: 150 mg via ORAL
  Filled 2012-10-10: qty 1

## 2012-10-10 MED ORDER — POTASSIUM CHLORIDE CRYS ER 20 MEQ PO TBCR
40.0000 meq | EXTENDED_RELEASE_TABLET | Freq: Once | ORAL | Status: AC
  Administered 2012-10-10: 40 meq via ORAL
  Filled 2012-10-10: qty 2

## 2012-10-10 NOTE — Progress Notes (Signed)
Clinical Social Work  CSW attempted to meet with patient to complete assessment. Patient currently speaking with chaplain. CSW will follow up at a later time.  [place], [place] [phone]

## 2012-10-10 NOTE — Progress Notes (Signed)
Sitter in room. Patient denies being suicidal now. She said, "I want to live." Patient utilized language that revealed a broken self image. She said "I beat up on myself a lot." She talked about the message that she "is bad." We talked about shame vs acceptance, and healing of her self-image. She is a person of faith so we talked about God's unconditional love and acceptance and how she might see herself through her Creator's eyes. I recommended that she talk to her counselor about healing her self-image and accepting herself as she is rather than for what she does. We also discussed existential issues of meaning and purpose.  Presence, listening, prayer.

## 2012-10-10 NOTE — Progress Notes (Signed)
[facility] PROGRESS NOTE  Donna Black ION:[id number] DOB: [birth year] DOA: 10/09/2012 PCP: Emeterio Reeve, MD  Assessment/Plan: 1. Tylenol overdose/toxicity - continue Mucomyst IV per Pharmacy protocol till this evening - Repeat LFTS this am normal - Repeat tylenol level in normal range now - D/w Poison control, they recommended repeat LFTS/INR/Tylenol this evening before stopping mucomist. - Poison Control: [phone]  - advance diet - transfer to Floor  2. Suicide attempt  -sitter at bedside  -Psychiatry consultation pending -disposition per Psych  3. Metabolic acidosis  -lactic acid borderline high, urine ketones elevated,  -improving, continue IVF today  4. Hypokalemia  -replace   Tx to floor/ambulate  Code Status: Full Family Communication: none at bedside Disposition Plan: per PSychiatry   Consultants:  Psychiatry pending  HPI/Subjective: Denies any complaints, in better spirits today  Objective: Filed Vitals:   10/10/12 0840  BP: 139/67  Pulse:   Temp:   Resp: 23    Intake/Output Summary (Last 24 hours) at 10/10/12 0939 Last data filed at 10/10/12 0800  Gross per 24 hour  Intake   1882 ml  Output   1350 ml  Net    532 ml   Filed Weights   10/09/12 1528 10/09/12 1542 10/09/12 1825  Weight: 77.111 kg (170 lb) 76.913 kg (169 lb 9 oz) 77.9 kg (171 lb 11.8 oz)    Exam:   General:  AAOx3  Cardiovascular: S1S2/RRR  Respiratory: CTAB  Abdomen: soft, Nt, BS present  Musculoskeletal: no edema c/c   Data Reviewed: Basic Metabolic Panel:  Recent Labs Lab 10/09/12 1439 10/10/12 0340  NA 137 135  K 3.2* 3.2*  CL 100 104  CO2 16* 17*  GLUCOSE 91 94  BUN 11 7  CREATININE 0.61 0.51  CALCIUM 9.2 8.0*   Liver Function Tests:  Recent Labs Lab 10/09/12 1439 10/09/12 1855 10/10/12 0340  AST 24 17 22   ALT 15 14 16   ALKPHOS 48 40 37*  BILITOT 0.3 0.2* 0.4  PROT 7.2 6.5 5.9*  ALBUMIN 3.9 3.1* 2.9*   No results found  for this basename: LIPASE, AMYLASE,  in the last 168 hours No results found for this basename: AMMONIA,  in the last 168 hours CBC:  Recent Labs Lab 10/09/12 1439 10/10/12 0340  WBC 6.4 8.7  HGB 14.4 13.5  HCT 40.5 39.4  MCV 95.3 95.9  PLT 255 258   Cardiac Enzymes: No results found for this basename: CKTOTAL, CKMB, CKMBINDEX, TROPONINI,  in the last 168 hours BNP (last 3 results) No results found for this basename: PROBNP,  in the last 8760 hours CBG: No results found for this basename: GLUCAP,  in the last 168 hours  Recent Results (from the past 240 hour(s))  MRSA PCR SCREENING     Status: None   Collection Time    10/09/12  6:27 PM      Result Value Range Status   MRSA by PCR NEGATIVE  NEGATIVE Final   Comment:            The GeneXpert MRSA Assay (FDA     approved for NASAL specimens     only), is one component of a     comprehensive MRSA colonization     surveillance program. It is not     intended to diagnose MRSA     infection nor to guide or     monitor treatment for     MRSA infections.     Performed at [facility]  Studies: No results found.  Scheduled Meds: . buPROPion  300 mg Oral Daily  . enoxaparin (LOVENOX) injection  40 mg Subcutaneous Q24H   Continuous Infusions: . sodium chloride 100 mL/hr at 10/10/12 0417  . acetylcysteine 15 mg/kg/hr (10/09/12 1900)    Active Problems:   Tylenol poisoning   Major depressive disorder   Suicidal overdose    Time spent:    [facility]  [facility] Pager [phone]. If 7PM-7AM, please contact night-coverage at [email or web address], password [email or web address] 10/10/2012, 9:39 AM  LOS: 1 day

## 2012-10-11 ENCOUNTER — Inpatient Hospital Stay (HOSPITAL_COMMUNITY)
Admission: AD | Admit: 2012-10-11 | Discharge: 2012-10-16 | DRG: 885 | Disposition: A | Discharge status: Home / Self Care | Payer: Commercial Managed Care - PPO | Source: Intra-hospital | Attending: Psychiatry | Admitting: Psychiatry

## 2012-10-11 ENCOUNTER — Other Ambulatory Visit (HOSPITAL_COMMUNITY): Payer: Self-pay

## 2012-10-11 ENCOUNTER — Telehealth (HOSPITAL_COMMUNITY): Payer: Self-pay | Admitting: *Deleted

## 2012-10-11 ENCOUNTER — Encounter (HOSPITAL_COMMUNITY): Payer: Self-pay | Admitting: Emergency Medicine

## 2012-10-11 ENCOUNTER — Encounter (HOSPITAL_COMMUNITY): Payer: Self-pay | Admitting: Psychiatry

## 2012-10-11 DIAGNOSIS — F332 Major depressive disorder, recurrent severe without psychotic features: Principal | ICD-10-CM | POA: Diagnosis present

## 2012-10-11 DIAGNOSIS — F17200 Nicotine dependence, unspecified, uncomplicated: Secondary | ICD-10-CM | POA: Diagnosis present

## 2012-10-11 DIAGNOSIS — F22 Delusional disorders: Secondary | ICD-10-CM | POA: Diagnosis present

## 2012-10-11 DIAGNOSIS — F411 Generalized anxiety disorder: Secondary | ICD-10-CM | POA: Diagnosis present

## 2012-10-11 DIAGNOSIS — T50902D Poisoning by unspecified drugs, medicaments and biological substances, intentional self-harm, subsequent encounter: Secondary | ICD-10-CM

## 2012-10-11 DIAGNOSIS — F1210 Cannabis abuse, uncomplicated: Secondary | ICD-10-CM | POA: Diagnosis present

## 2012-10-11 DIAGNOSIS — Z5189 Encounter for other specified aftercare: Secondary | ICD-10-CM

## 2012-10-11 DIAGNOSIS — F329 Major depressive disorder, single episode, unspecified: Secondary | ICD-10-CM

## 2012-10-11 DIAGNOSIS — F331 Major depressive disorder, recurrent, moderate: Secondary | ICD-10-CM

## 2012-10-11 DIAGNOSIS — C50919 Malignant neoplasm of unspecified site of unspecified female breast: Secondary | ICD-10-CM | POA: Diagnosis present

## 2012-10-11 DIAGNOSIS — Z720 Tobacco use: Secondary | ICD-10-CM

## 2012-10-11 LAB — COMPREHENSIVE METABOLIC PANEL
ALT: 12 U/L (ref 0–35)
BUN: 5 mg/dL — ABNORMAL LOW (ref 6–23)
CO2: 18 mEq/L — ABNORMAL LOW (ref 19–32)
Calcium: 8.7 mg/dL (ref 8.4–10.5)
Creatinine, Ser: 0.59 mg/dL (ref 0.50–1.10)
GFR calc Af Amer: >90 mL/min (ref >90)
GFR calc non Af Amer: >90 mL/min (ref >90)
Glucose, Bld: 82 mg/dL (ref 70–99)
Sodium: 135 mEq/L (ref 135–145)
Total Protein: 6 g/dL (ref 6.0–8.3)

## 2012-10-11 LAB — PROTIME-INR
INR: 1.05 (ref 0.00–1.49)
Prothrombin Time: 13.5 seconds (ref 11.6–15.2)

## 2012-10-11 MED ORDER — ALUM & MAG HYDROXIDE-SIMETH 200-200-20 MG/5ML PO SUSP: 30.0000 mL | ORAL | Status: DC | PRN

## 2012-10-11 MED ORDER — RISPERIDONE 1 MG PO TBDP
1.0000 mg | ORAL_TABLET | Freq: Every day | ORAL | Status: DC
  Administered 2012-10-11 – 2012-10-13 (×3): 1 mg via ORAL
  Filled 2012-10-11 (×5): qty 1

## 2012-10-11 MED ORDER — ACETAMINOPHEN 325 MG PO TABS: 650.0000 mg | ORAL_TABLET | Freq: Four times a day (QID) | ORAL | Status: DC | PRN

## 2012-10-11 MED ORDER — MAGNESIUM HYDROXIDE 400 MG/5ML PO SUSP: 30.0000 mL | Freq: Every day | ORAL | Status: DC | PRN

## 2012-10-11 MED ORDER — BUPROPION HCL ER (XL) 150 MG PO TB24
150.0000 mg | ORAL_TABLET | ORAL | Status: DC
  Administered 2012-10-12 – 2012-10-16 (×5): 150 mg via ORAL
  Filled 2012-10-11 (×7): qty 1

## 2012-10-11 MED ORDER — BUPROPION HCL ER (XL) 150 MG PO TB24: 150.0000 mg | ORAL_TABLET | ORAL | Status: DC

## 2012-10-11 NOTE — Progress Notes (Signed)
Clinical Social Work Department CLINICAL SOCIAL WORK PSYCHIATRY SERVICE LINE ASSESSMENT 10/11/2012  Patient:  Donna Black  Account:  [id number]  Admit Date:  10/09/2012  Clinical Social Worker:  Unk Lightning, LCSW  Date/Time:  10/11/2012 10:30 AM Referred by:  Physician  Date referred:  10/11/2012 Reason for Referral  Psychosocial assessment   Presenting Symptoms/Problems (In the person's/family's own words):   Psych consulted due to patient attempting to overdose.   Abuse/Neglect/Trauma History (check all that apply)  Denies history   Abuse/Neglect/Trauma Comments:   Psychiatric History (check all that apply)  Outpatient treatment  Inpatient/hospitilization   Psychiatric medications:  Wellbutrin 150 mg  Seroquel 100 mg   Current Mental Health Hospitalizations/Previous Mental Health History:   Patient reports she was diagnosed with bipolar several years ago. Patient had suicide attempt about 3 years ago and was told that she was misdiagnosed with bipolar and [facility] diagnosed her with depression. Patient currently receiving medication management and individual therapy.   Current provider:   Dr. Florentina Jenny   Place and Date:   Outpt [facility]   Current Medications:   ondansetron (ZOFRAN) IV            . buPROPion  150 mg Oral Daily  . enoxaparin (LOVENOX) injection  40 mg Subcutaneous Q24H   Previous Impatient Admission/Date/Reason:   Patient had inpatient stay at [facility] about 3 years ago. Patient completed intensive outpatient at [facility] after inpatient stay.   Emotional Health / Current Symptoms    Suicide/Self Harm  Suicide attempt in past (date/description)   Suicide attempt in the past:   Patient admitted after suicide attempt. Patient also attempted suicide about three years ago. Patient denies any current SI or HI at this time.   Other harmful behavior:   None reported   Psychotic/Dissociative Symptoms  None reported   Other Psychotic/Dissociative Symptoms:   N/A     Attention/Behavioral Symptoms  Within Normal Limits   Other Attention / Behavioral Symptoms:   Patient engaged throughout assessment.    Cognitive Impairment  Orientation - Place  Orientation - Self  Orientation - Situation  Orientation - Time   Other Cognitive Impairment:    Mood and Adjustment  Mood Congruent    Stress, Anxiety, Trauma, Any Recent Loss/Stressor  Relationship   Anxiety (frequency):   N/A   Phobia (specify):   N/A   Compulsive behavior (specify):   N/A   Obsessive behavior (specify):   N/A   Other:   Patient reports she and husband have a strained relationship. Patient denies any abuse in relationship but reports that they might get divorced.   Substance Abuse/Use  Current substance use   SBIRT completed (please refer for detailed history):  Y  Self-reported substance use:   Patient reports she used to be a heavy drinker. Patient reports that after seeing Dr. Lolly Mustache she was warned against drinking alcohol with medications so reduced her consumption. Patient reports that she currently drinks a few times a week. When CSW inquired about patient's use patient is able to identify that she consumes alcohol when she is feeling depressed. Patient agreeable to discuss alcohol use and develop positive coping skills through therapy. Patient denies any other substance use but does smoke cigarettes.   Urinary Drug Screen Completed:  Y Alcohol level:   55    Environmental/Housing/Living Arrangement  With Family Member   Who is in the home:   Husband and 67 yr old son   Emergency contact:  John-spouse   Surveyor, quantity  Private Insurance   Patient's Strengths and Goals (patient's own words):   Patient reports that she is motivated to change and want to work on treatment goals.   Clinical Social Worker's Interpretive Summary:   CSW received referral to complete psychosocial assessment. CSW reviewed chart and met with patient at bedside. CSW  introduced myself and explained role.    Patient reports that she is married and has twin girls that are 47 years old and an 62 year old son. Patient has felt depressed for the past 3 years and had attempted suicide about 3 years ago. After previous attempt, patient was placed at [facility] for inpatient treatment and completed intensive outpatient treatment after hospitalization. Patient reports that inpatient was not beneficial but felt that intensive outpatient was helpful.    Patient reports she saw her psychiatrist last week who increased her Wellbutrin from 150 mg to 300 mg. Patient reports that she was not feeling suicidal and cannot identify any triggers. Patient feels that medication affected her judgment and she impulsively overdosed. Patient reports she did not want to die and glad she was not successful. Patient reports that she wants to DC home with intensive outpatient treatment.    Patient reports that she used to be a heavy drinker. Patient never received any treatment for alcohol use but felt she consumed in large amounts due to depression. Patient reports she has reduced her intake but drank about 3 glasses of wine on Monday due to feeling upset. Patient is aware of negative side affects from consuming alcohol while taking psychotropic medications. CSW spoke with patient about discussing substance use in therapy in order to develop positive coping skills.    Patient reports that she has been seeing her current therapist for about 6 weeks. Patient does not feel that these sessions have been helpful and reports that therapist "makes me feel bad about myself". Patient reports she has started attending church again and feels that she needs to see a [place] based counselor.    CSW and patient discussed formal vs informal support systems and the benefit of having both. Patient reports she was unaware that [facility] had a crisis number and did not want to burden her friends by calling them. Patient reports  that she now understands the importance of supports during crisis.    CSW explained that psych MD would evaluate patient and would make recommendations regarding disposition. Patient wants to talk with psych MD about medication changes and prefers to follow up on outpatient basis.    Patient was alert and oriented throughout assessment. Patient has appropriate affect and has good insight into goals for treatment. Patient is impulsive and reports several stressors at home including marital issues. Patient is able to contract for safety at this time and denies any SI or HI.    CSW will continue to follow and assist with recommendations provided by psych MD. CSW paged chaplain per patient request and chaplain is agreeable to visit with patient again today.   Disposition:  Recommend Psych CSW continuing to support while in hospital

## 2012-10-11 NOTE — Progress Notes (Signed)
Patient ID: Donna Black, female   DOB: Dec 30, 1965, 47 y.o.   MRN: [id number] Report given to Morrie Sheldon, [place]

## 2012-10-11 NOTE — Tx Team (Signed)
Initial Interdisciplinary Treatment Plan  PATIENT STRENGTHS: (choose at least two) Average or above average intelligence Physical Health Supportive family/friends  PATIENT STRESSORS: Marital or family conflict Substance abuse   PROBLEM LIST: Problem List/Patient Goals Date to be addressed Date deferred Reason deferred Estimated date of resolution  Depression 10/11/12     Suicide Risk 10/11/12                                                DISCHARGE CRITERIA:  Improved stabilization in mood, thinking, and/or behavior Medical problems require only outpatient monitoring Motivation to continue treatment in a less acute level of care Need for constant or close observation no longer present Verbal commitment to aftercare and medication compliance  PRELIMINARY DISCHARGE PLAN: Outpatient therapy Return to previous living arrangement  PATIENT/FAMIILY INVOLVEMENT: This treatment plan has been presented to and reviewed with the patient, Donna Black, and/or family member.  The patient and family have been given the opportunity to ask questions and make suggestions.  Renaee Munda 10/11/2012, 8:25 PM

## 2012-10-11 NOTE — Progress Notes (Signed)
Patient ID: Donna Black, female   DOB: Jun 27, 1965, 47 y.o.   MRN: [id number]  Admission Note: Patient is a 47 yo female admitted under IVC for depression and a suicide attempt by overdose on Tylenol and Seroquel. Pt minimizing everything during admission, stating it was just an impulsive act and was due to an increase in her Wellbutrin dose. Pt states she is safe to go home and has signed a 72 hour release form. Pt denies SI/HI or plans to harm herself. When writer asked pt about the phone call that upset her from her daughter and brother, pt states "It has nothing to do with why I'm here and it's not something I want to go into right now". Pt with dull, flat affect but is hyperverbal on assessment. Pt verbally contracts for safety.

## 2012-10-11 NOTE — BH Assessment (Addendum)
Assessment Note  Donna Black is an 47 y.o. female. Pt presents as a telephone referral from [facility] [place]. Per Epic documentation and mid level NP who evaluated patient. HPI: Patient has a long history of depression and delusional disorder with no insight. She received a phone call at home the day of admission from her family and became upset. Donna Black then took an overdose of tylenol, Seroquel with wine. She was admitted to ICU and stabilized. Donna Black is adamant that her increase in Wellbutrin on Friday caused her to be irrational with crying uncontrollably but later calmed down. She does not acknowledge her overdose or its severity. Her therapist called her psychiatrist and told her she was concerned about her delusional behavior. When the patient was told this information, she said she would not be seeing her anymore. Long history of psychiatric treatment with delusional behaviors and no insight. She is convinced her husband is doing this, husband agreed and went to go to family therapy for her sake. Patient will be admitted to behavioral medicine despite her denial of any issues. Upset and tearful when told this plan.  Pt accepted to [facility] by NP Nanine Means.  Axis I: Major Depression, Recurrent severe Axis II: Deferred Axis III:  Past Medical History  Diagnosis Date  . Wears glasses   . Depression   . Cancer     bilateral breast cancer  . Heart murmur     told off and on in past that she has had one  . Grave's disease     diagnosed about 7 yrs ago but after a year she has been doing well  . Anemia     when she was younger  . Complication of anesthesia     ?, heart rate dropped w/wisdom teeth   Axis IV: other psychosocial or environmental problems Axis V: 31-40 impairment in reality testing  Past Medical History:  Past Medical History  Diagnosis Date  . Wears glasses   . Depression   . Cancer     bilateral breast cancer  . Heart murmur     told off and on in  past that she has had one  . Grave's disease     diagnosed about 7 yrs ago but after a year she has been doing well  . Anemia     when she was younger  . Complication of anesthesia     ?, heart rate dropped w/wisdom teeth    Past Surgical History  Procedure Laterality Date  . Cholecystectomy    . Wisdom tooth extraction    . Breast fibroadenoma surgery  1987, 2000    bilateral  . Cesarean section      2001  . Breast reconstruction  05/12/2011    Procedure: BREAST RECONSTRUCTION;  Surgeon: Etter Sjogren, MD;  Location: [facility] OR;  Service: Plastics;  Laterality: Bilateral;  PLACEMENT OF BILATERAL TISSUE EXPANDERS WITH POSSIBLE USE OF HD FLEX    Family History:  Family History  Problem Relation Age of Onset  . Breast cancer Maternal Grandmother   . Cancer Maternal Grandmother     breast  . Heart disease Mother   . Stroke Mother   . Heart disease Father     Social History:  reports that she has been smoking Cigarettes.  She has been smoking about 0.50 packs per day. She has never used smokeless tobacco. She reports that she drinks about 1.2 ounces of alcohol per week. She reports that she does  not use illicit drugs.  Additional Social History:  Alcohol / Drug Use History of alcohol / drug use?: No history of alcohol / drug abuse (No alcohol/drug abuse secondary to overdose on Tylenol)  CIWA:   COWS:    Allergies:  Allergies  Allergen Reactions  . Penicillins Other (See Comments)    Did not work as a child    Home Medications:  (Not in a hospital admission)  OB/GYN Status:  Patient's last menstrual period was 09/25/2012.  General Assessment Data Location of Assessment: [facility] Services Is this a Tele or Face-to-Face Assessment?:  (Telephone Referral from Med Floor 5 [place] at [facility]) Is this an Initial Assessment or a Re-assessment for this encounter?: Initial Assessment Living Arrangements: Children Can pt return to current living arrangement?:   (unknown) Admission Status: Voluntary Is patient capable of signing voluntary admission?: Yes Transfer from: Acute Hospital Referral Source: MD     [facility] Plan Living Arrangements: Children Name of Psychiatrist: Unknown Name of Therapist: Unknown     Risk to self Suicidal Ideation: Yes-Currently Present (Pt overdosed on Tylenol with  Seroquel and Wine) Suicidal Intent: Yes-Currently Present Is patient at risk for suicide?: Yes Suicidal Plan?: No Access to Means: No What has been your use of drugs/alcohol within the last 12 months?: None noted secondary to overdose Previous Attempts/Gestures:  (unknown) How many times?:  (unknown) Other Self Harm Risks: unknown Triggers for Past Attempts: Unknown Intentional Self Injurious Behavior: None Family Suicide History: Unknown Recent stressful life event(s): Conflict (Comment) Persecutory voices/beliefs?: No Depression: No Substance abuse history and/or treatment for substance abuse?: No Suicide prevention information given to non-admitted patients: Not applicable  Risk to Others Homicidal Ideation: No Thoughts of Harm to Others: No Current Homicidal Intent: No Current Homicidal Plan: No Access to Homicidal Means: No Identified Victim: na History of harm to others?: No Assessment of Violence: None Noted Violent Behavior Description: None Noted Does patient have access to weapons?: No (unknown) Criminal Charges Pending?: No (unknown) Does patient have a court date: No (unknown)  Psychosis Hallucinations: None noted Delusions: None noted (Hx of delusional disorder with no insight noted)  Mental Status Report Appear/Hygiene: Other (Comment) (Unable to assess) Speech: Logical/coherent Mood: Depressed Affect: Depressed Anxiety Level:  (unable to assess) Judgement:  (unable to assess) Orientation:  (unable to assess) Obsessive Compulsive Thoughts/Behaviors:  (unable to assess)  Cognitive  Functioning Concentration:  (ukn) Memory:  Otho Bellows) IQ:  (ukn) Insight:  (ukn) Impulse Control:  (ukn) Appetite:  (ukn) Weight Loss:  (ukn) Weight Gain:  (ukn) Sleep:  (ukn) Total Hours of Sleep:  (ukn) Vegetative Symptoms: None (ukn)  ADLScreening [facility] Assessment Services) Patient's cognitive ability adequate to safely complete daily activities?: Yes Patient able to express need for assistance with ADLs?: Yes Independently performs ADLs?: Yes (appropriate for developmental age)  Prior Inpatient Therapy Prior Inpatient Therapy:  (unknown) Prior Therapy Dates: unk Prior Therapy Facilty/Provider(s): [facility] Reason for Treatment: [facility]  Prior Outpatient Therapy Prior Outpatient Therapy:  (it is noted that pt has a therapist and psychiatrist) Prior Therapy Dates: na Prior Therapy Facilty/Provider(s): na Reason for Treatment: Medication??  ADL Screening (condition at time of admission) Patient's cognitive ability adequate to safely complete daily activities?: Yes Patient able to express need for assistance with ADLs?: Yes Independently performs ADLs?: Yes (appropriate for developmental age)       Abuse/Neglect Assessment (Assessment to be complete while patient is alone) Physical Abuse:  (unknown) Verbal Abuse:  (unknown) Sexual Abuse:  (  unknown) Exploitation of patient/patient's resources:  (unknown) Self-Neglect:  (unknown)          Additional Information 1:1 In Past 12 Months?: No (ukn) CIRT Risk: No (ukn) Elopement Risk: No (ukn) Does patient have medical clearance?: Yes     Disposition:  Disposition Initial Assessment Completed for this Encounter: Yes Disposition of Patient: Inpatient treatment program Type of inpatient treatment program: Adult  On Site Evaluation by:   Reviewed with Physician:   Glorious Peach, MS, LCASA Assessment Counselor  Bjorn Pippin 10/11/2012 10:51 PM

## 2012-10-11 NOTE — Consult Note (Signed)
Reason for Consult:  Overdose Referring Physician: Dr. Darletta Moll Norbeck is an 47 y.o. female.  HPI: Patient has a long history of depression and delusional disorder with no insight.  She received a phone call at home the day of admission from her family and became upset.  Ms. Rekowski then took an overdose of tylenol, Seroquel with wine.  She was admitted to ICU and stabilized.  Ms. Haltiwanger is adamant that her increase in Wellbutrin on Friday caused her to be irrational with crying uncontrollably but later calmed down.  She does not acknowledge her overdose or its severity.  Her therapist called her psychiatrist and told her she was concerned about her delusional behavior.  When the patient was told this information, she said she would not be seeing her anymore.  Long history of psychiatric treatment with delusional behaviors and no insight.  She is convinced her husband is doing this, husband agreed and went to go to family therapy for her sake.  Patient will be admitted to behavioral medicine despite her denial of any issues.  Upset and tearful when told this plan.  Past Medical History  Diagnosis Date  . Wears glasses   . Depression   . Cancer     bilateral breast cancer  . Heart murmur     told off and on in past that she has had one  . Grave's disease     diagnosed about 7 yrs ago but after a year she has been doing well  . Anemia     when she was younger  . Complication of anesthesia     ?, heart rate dropped w/wisdom teeth    Past Surgical History  Procedure Laterality Date  . Cholecystectomy    . Wisdom tooth extraction    . Breast fibroadenoma surgery  1987, 2000    bilateral  . Cesarean section      2001  . Breast reconstruction  05/12/2011    Procedure: BREAST RECONSTRUCTION;  Surgeon: Etter Sjogren, MD;  Location: [facility] OR;  Service: Plastics;  Laterality: Bilateral;  PLACEMENT OF BILATERAL TISSUE EXPANDERS WITH POSSIBLE USE OF HD FLEX    Family History  Problem Relation  Age of Onset  . Breast cancer Maternal Grandmother   . Cancer Maternal Grandmother     breast  . Heart disease Mother   . Stroke Mother   . Heart disease Father     Social History:  reports that she quit smoking about 18 months ago. Her smoking use included Cigarettes. She smoked 0.50 packs per day. She has never used smokeless tobacco. She reports that she drinks about 1.2 ounces of alcohol per week. She reports that she does not use illicit drugs.  Allergies:  Allergies  Allergen Reactions  . Penicillins Other (See Comments)    Did not work as a child    Medications: I have reviewed the patient's current medications.  Results for orders placed during the hospital encounter of 10/09/12 (from the past 48 hour(s))  MRSA PCR SCREENING     Status: None   Collection Time    10/09/12  6:27 PM      Result Value Range   MRSA by PCR NEGATIVE  NEGATIVE   Comment:            The GeneXpert MRSA Assay (FDA     approved for NASAL specimens     only), is one component of a     comprehensive MRSA colonization  surveillance program. It is not     intended to diagnose MRSA     infection nor to guide or     monitor treatment for     MRSA infections.     Performed at [facility]  LACTIC ACID, PLASMA     Status: Abnormal   Collection Time    10/09/12  6:55 PM      Result Value Range   Lactic Acid, Venous 2.5 (*) 0.5 - 2.2 mmol/L  ACETAMINOPHEN LEVEL     Status: Abnormal   Collection Time    10/09/12  6:55 PM      Result Value Range   Acetaminophen (Tylenol), Serum 136.3 (*) 10 - 30 ug/mL   Comment:            THERAPEUTIC CONCENTRATIONS VARY     SIGNIFICANTLY. A RANGE OF 10-30     ug/mL MAY BE AN EFFECTIVE     CONCENTRATION FOR MANY PATIENTS.     HOWEVER, SOME ARE BEST TREATED     AT CONCENTRATIONS OUTSIDE THIS     RANGE.     ACETAMINOPHEN CONCENTRATIONS     >150 ug/mL AT 4 HOURS AFTER     INGESTION AND >50 ug/mL AT 12     HOURS AFTER INGESTION ARE     OFTEN  ASSOCIATED WITH TOXIC     REACTIONS.  HEPATIC FUNCTION PANEL     Status: Abnormal   Collection Time    10/09/12  6:55 PM      Result Value Range   Total Protein 6.5  6.0 - 8.3 g/dL   Albumin 3.1 (*) 3.5 - 5.2 g/dL   AST 17  0 - 37 U/L   ALT 14  0 - 35 U/L   Alkaline Phosphatase 40  39 - 117 U/L   Total Bilirubin 0.2 (*) 0.3 - 1.2 mg/dL   Bilirubin, Direct <1.9  0.0 - 0.3 mg/dL   Indirect Bilirubin NOT CALCULATED  0.3 - 0.9 mg/dL  COMPREHENSIVE METABOLIC PANEL     Status: Abnormal   Collection Time    10/10/12  3:40 AM      Result Value Range   Sodium 135  135 - 145 mEq/L   Potassium 3.2 (*) 3.5 - 5.1 mEq/L   Chloride 104  96 - 112 mEq/L   CO2 17 (*) 19 - 32 mEq/L   Glucose, Bld 94  70 - 99 mg/dL   BUN 7  6 - 23 mg/dL   Creatinine, Ser 1.47  0.50 - 1.10 mg/dL   Calcium 8.0 (*) 8.4 - 10.5 mg/dL   Total Protein 5.9 (*) 6.0 - 8.3 g/dL   Albumin 2.9 (*) 3.5 - 5.2 g/dL   AST 22  0 - 37 U/L   ALT 16  0 - 35 U/L   Alkaline Phosphatase 37 (*) 39 - 117 U/L   Total Bilirubin 0.4  0.3 - 1.2 mg/dL   GFR calc non Af Amer >90  >90 mL/min   GFR calc Af Amer >90  >90 mL/min   Comment: (NOTE)     The eGFR has been calculated using the CKD EPI equation.     This calculation has not been validated in all clinical situations.     eGFR's persistently <90 mL/min signify possible Chronic Kidney     Disease.  PROTIME-INR     Status: Abnormal   Collection Time    10/10/12  3:40 AM      Result Value Range  Prothrombin Time 15.6 (*) 11.6 - 15.2 seconds   INR 1.27  0.00 - 1.49  ACETAMINOPHEN LEVEL     Status: None   Collection Time    10/10/12  3:40 AM      Result Value Range   Acetaminophen (Tylenol), Serum <15.0  10 - 30 ug/mL   Comment:            THERAPEUTIC CONCENTRATIONS VARY     SIGNIFICANTLY. A RANGE OF 10-30     ug/mL MAY BE AN EFFECTIVE     CONCENTRATION FOR MANY PATIENTS.     HOWEVER, SOME ARE BEST TREATED     AT CONCENTRATIONS OUTSIDE THIS     RANGE.     ACETAMINOPHEN  CONCENTRATIONS     >150 ug/mL AT 4 HOURS AFTER     INGESTION AND >50 ug/mL AT 12     HOURS AFTER INGESTION ARE     OFTEN ASSOCIATED WITH TOXIC     REACTIONS.  CBC     Status: None   Collection Time    10/10/12  3:40 AM      Result Value Range   WBC 8.7  4.0 - 10.5 K/uL   RBC 4.11  3.87 - 5.11 MIL/uL   Hemoglobin 13.5  12.0 - 15.0 g/dL   HCT 21.3  08.6 - 57.8 %   MCV 95.9  78.0 - 100.0 fL   MCH 32.8  26.0 - 34.0 pg   MCHC 34.3  30.0 - 36.0 g/dL   RDW 46.9  62.9 - 52.8 %   Platelets 258  150 - 400 K/uL  PROTIME-INR     Status: None   Collection Time    10/10/12  3:46 PM      Result Value Range   Prothrombin Time 14.7  11.6 - 15.2 seconds   INR 1.17  0.00 - 1.49  COMPREHENSIVE METABOLIC PANEL     Status: Abnormal   Collection Time    10/10/12  3:46 PM      Result Value Range   Sodium 136  135 - 145 mEq/L   Potassium 3.9  3.5 - 5.1 mEq/L   Comment: REPEATED TO VERIFY     DELTA CHECK NOTED   Chloride 106  96 - 112 mEq/L   CO2 18 (*) 19 - 32 mEq/L   Glucose, Bld 87  70 - 99 mg/dL   BUN 5 (*) 6 - 23 mg/dL   Creatinine, Ser 4.13  0.50 - 1.10 mg/dL   Calcium 8.6  8.4 - 24.4 mg/dL   Total Protein 6.1  6.0 - 8.3 g/dL   Albumin 3.0 (*) 3.5 - 5.2 g/dL   AST 15  0 - 37 U/L   ALT 13  0 - 35 U/L   Alkaline Phosphatase 39  39 - 117 U/L   Total Bilirubin 0.3  0.3 - 1.2 mg/dL   GFR calc non Af Amer >90  >90 mL/min   GFR calc Af Amer >90  >90 mL/min   Comment: (NOTE)     The eGFR has been calculated using the CKD EPI equation.     This calculation has not been validated in all clinical situations.     eGFR's persistently <90 mL/min signify possible Chronic Kidney     Disease.  ACETAMINOPHEN LEVEL     Status: None   Collection Time    10/10/12  3:46 PM      Result Value Range   Acetaminophen (Tylenol), Serum <15.0  10 -  30 ug/mL   Comment:            THERAPEUTIC CONCENTRATIONS VARY     SIGNIFICANTLY. A RANGE OF 10-30     ug/mL MAY BE AN EFFECTIVE     CONCENTRATION FOR MANY  PATIENTS.     HOWEVER, SOME ARE BEST TREATED     AT CONCENTRATIONS OUTSIDE THIS     RANGE.     ACETAMINOPHEN CONCENTRATIONS     >150 ug/mL AT 4 HOURS AFTER     INGESTION AND >50 ug/mL AT 12     HOURS AFTER INGESTION ARE     OFTEN ASSOCIATED WITH TOXIC     REACTIONS.  COMPREHENSIVE METABOLIC PANEL     Status: Abnormal   Collection Time    10/11/12  4:20 AM      Result Value Range   Sodium 135  135 - 145 mEq/L   Potassium 3.5  3.5 - 5.1 mEq/L   Chloride 106  96 - 112 mEq/L   CO2 18 (*) 19 - 32 mEq/L   Glucose, Bld 82  70 - 99 mg/dL   BUN 5 (*) 6 - 23 mg/dL   Creatinine, Ser 1.61  0.50 - 1.10 mg/dL   Calcium 8.7  8.4 - 09.6 mg/dL   Total Protein 6.0  6.0 - 8.3 g/dL   Albumin 3.0 (*) 3.5 - 5.2 g/dL   AST 13  0 - 37 U/L   ALT 12  0 - 35 U/L   Alkaline Phosphatase 39  39 - 117 U/L   Total Bilirubin 0.1 (*) 0.3 - 1.2 mg/dL   GFR calc non Af Amer >90  >90 mL/min   GFR calc Af Amer >90  >90 mL/min   Comment: (NOTE)     The eGFR has been calculated using the CKD EPI equation.     This calculation has not been validated in all clinical situations.     eGFR's persistently <90 mL/min signify possible Chronic Kidney     Disease.  PROTIME-INR     Status: None   Collection Time    10/11/12  4:20 AM      Result Value Range   Prothrombin Time 13.5  11.6 - 15.2 seconds   INR 1.05  0.00 - 1.49    No results found.  Review of Systems  Constitutional: Negative.   HENT: Negative.   Eyes: Negative.   Respiratory: Negative.   Cardiovascular: Negative.   Gastrointestinal: Negative.   Genitourinary: Negative.   Musculoskeletal: Negative.   Skin: Negative.   Neurological: Negative.   Endo/Heme/Allergies: Negative.   Psychiatric/Behavioral:       Delusional   Blood pressure 120/60, pulse 76, temperature 97.9 F (36.6 C), temperature source Oral, resp. rate 18, height 5\' 4"  (1.626 m), weight 78.109 kg (172 lb 3.2 oz), last menstrual period 09/25/2012, SpO2 100.00%. Physical  Exam Completed by primary MD, reviewed  Family History:  No family history on file.  Assessment/Plan:   Mental Status Examination/Evaluation: Patient is groomed appropriately with good eye contact, anxious and defensive.  She denies any active or passive suicidal thoughts or homicidal thoughts.  Her thoughts are organized but does not acknowledge her suicide attempt prior to admission.  Delusional with paranoid thoughts of sabatoge. She denies any auditory or visual hallucinations. Her attention and concentration are fair.  His insight and judgment are poor.  DIAGNOSIS:  AXIS I  Major depressive disorder, delusional disorder  AXIS II  Deferred   AXIS III  Grave's Disease  AXIS IV  other psychosocial or environmental problems, chronic delusional disorder, problems with access to health care services and problems with primary support group   AXIS V  Severe symptoms    Assessment/Plan:  Recommend continue current psychiatric medications and admit to inpatient psychiatric care at [facility], room number 501-02. Contact Child psychotherapist for outpatient discharge planning.  Nanine Means, PMH-NP 10/11/2012, 5:46 PM    Patient is known to this Clinical research associate and has been decompensating slowly. I agreed with the findings, treatment and disposition plan of this patient. Kathryne Sharper, MD

## 2012-10-11 NOTE — Progress Notes (Signed)
Report called to Erskine Squibb, RN at [facility]. Pt d/c to [facility]. Left the unit in stable condition.

## 2012-10-11 NOTE — Progress Notes (Signed)
Lengthy visit. Patient expressed idea that others hate her and she "needed to find out why." She is carrying a lot of guilt and shame. We explored self-forgiveness and extending grace to oneself and what this might look like for if she did this for herself. I suggested journaling by writing and through art to help her understand who she is (as opposed to who she thinks others want her to be) and what she loves.

## 2012-10-11 NOTE — Progress Notes (Signed)
D: Patient in her room on on approach.  Patient states  She feels much better.  Patient states her medications were increased and it made her anxious.  Patient states she was not normal.  Patient states her doctor is telling people she is paranoid but that is not the case.  Patient denies SI/HI and denies AVH.   A: Staff to monitor Q 15 mins for safety.  Encouragement and support offered.  Scheduled medications administered per orders. R: Patient remains safe on the unit.  Patient attended group tonight.  Patient calm, cooperative and taking administered medications.  Patient visible on the unit tonight.

## 2012-10-11 NOTE — Progress Notes (Signed)
Clinical Social Work  CSW received a call from [place] at [facility] reporting that patient had been accepted to [facility] 500-1. CSW went to speak with patient who reports she wants to speak with psych MD face to face prior to DC from the hospital. CSW spoke with AD York Ram) and [facility] assesment Elijah Birk) who is aware of situation and reports that PA or MD will come to [facility] and evaluate patient face to face. CSW updated RN and will ask ED SW to assist after disposition is determined.  [place], [place] [phone]

## 2012-10-11 NOTE — Discharge Summary (Addendum)
Physician Discharge Summary  Donna Black ZOX:[id number] DOB: 1965/03/09 DOA: 10/09/2012  PCP: Emeterio Reeve, MD  Admit date: 10/09/2012 Discharge date: 10/11/2012  Time spent: 35 minutes  Recommendations for Outpatient Follow-up:  1. Per Psychiatry  Discharge Diagnoses:  Active Problems:   Tylenol poisoning   Major depressive disorder   Suicidal overdose  Discharge Condition: stable  Diet recommendation: regular  Filed Weights   10/09/12 1542 10/09/12 1825 10/10/12 1803  Weight: 76.913 kg (169 lb 9 oz) 77.9 kg (171 lb 11.8 oz) 78.109 kg (172 lb 3.2 oz)    History of present illness:  Donna Black is a 47 y.o. female with h/o Depression, prior suicide attempt, has been increasingly depressed recently due to her home environment, went to church yesterday evening felt better for a little while and then came back home, got a call from her brother and daughter, this made her upset, then ingested a handful of Tylenol ES, wine and Seroquel.  The day of admission, she ingested 3 handfuls of Tylenol, 6 tablets of Seroquel and was found her laying on the workshop floor curled up and stating that she had overdosed.    Hospital Course:  1. Tylenol overdose/toxicity  - completed Mucomyst IV per Pharmacy protocol for 24hours - Repeat LFTS normal  - Repeat tylenol level in normal range since eysterday -asymptomatic, tolerating diet  2. Suicide attempt  -sitter at bedside  -Psychiatry consultation pending  -disposition per Psych -discussed with NP Jaimison with Dr.Kumar who recommended transfer to [facility]  3. Metabolic acidosis  -lactic acid borderline high, urine ketones elevated,  -improving  4. Hypokalemia  -replaced and corrected   Discharge Exam: Filed Vitals:   10/10/12 2222  BP: 128/60  Pulse: 74  Temp: 98.8 F (37.1 C)  Resp: 20    General: AAOX3, no distress, in good spirits Cardiovascular: S1S2/RRR Respiratory: CTAB  Discharge Instructions  Discharge  Orders   Future Orders Complete By Expires   Increase activity slowly  As directed        Medication List    STOP taking these medications       QUEtiapine 100 MG tablet  Commonly known as:  SEROQUEL      TAKE these medications       buPROPion 150 MG 24 hr tablet  Commonly known as:  WELLBUTRIN XL  Take 1 tablet (150 mg total) by mouth every morning.       Allergies  Allergen Reactions  . Penicillins Other (See Comments)    Did not work as a child      The results of significant diagnostics from this hospitalization (including imaging, microbiology, ancillary and laboratory) are listed below for reference.    Significant Diagnostic Studies: No results found.  Microbiology: Recent Results (from the past 240 hour(s))  MRSA PCR SCREENING     Status: None   Collection Time    10/09/12  6:27 PM      Result Value Range Status   MRSA by PCR NEGATIVE  NEGATIVE Final   Comment:            The GeneXpert MRSA Assay (FDA     approved for NASAL specimens     only), is one component of a     comprehensive MRSA colonization     surveillance program. It is not     intended to diagnose MRSA     infection nor to guide or     monitor treatment for     MRSA infections.  Performed at [facility]     Labs: Basic Metabolic Panel:  Recent Labs Lab 10/09/12 1439 10/10/12 0340 10/10/12 1546 10/11/12 0420  NA 137 135 136 135  K 3.2* 3.2* 3.9 3.5  CL 100 104 106 106  CO2 16* 17* 18* 18*  GLUCOSE 91 94 87 82  BUN 11 7 5* 5*  CREATININE 0.61 0.51 0.56 0.59  CALCIUM 9.2 8.0* 8.6 8.7   Liver Function Tests:  Recent Labs Lab 10/09/12 1439 10/09/12 1855 10/10/12 0340 10/10/12 1546 10/11/12 0420  AST 24 17 22 15 13   ALT 15 14 16 13 12   ALKPHOS 48 40 37* 39 39  BILITOT 0.3 0.2* 0.4 0.3 0.1*  PROT 7.2 6.5 5.9* 6.1 6.0  ALBUMIN 3.9 3.1* 2.9* 3.0* 3.0*   No results found for this basename: LIPASE, AMYLASE,  in the last 168 hours No results found for this  basename: AMMONIA,  in the last 168 hours CBC:  Recent Labs Lab 10/09/12 1439 10/10/12 0340  WBC 6.4 8.7  HGB 14.4 13.5  HCT 40.5 39.4  MCV 95.3 95.9  PLT 255 258   Cardiac Enzymes: No results found for this basename: CKTOTAL, CKMB, CKMBINDEX, TROPONINI,  in the last 168 hours BNP: BNP (last 3 results) No results found for this basename: PROBNP,  in the last 8760 hours CBG: No results found for this basename: GLUCAP,  in the last 168 hours     Signed:  Ronia Hazelett  [facility] 10/11/2012, 1:45 PM

## 2012-10-11 NOTE — [facility] Group Notes (Signed)
Adult Psychoeducational Group Note  Date:  10/11/2012 Time:  9:36 PM  Group Topic/Focus:  Wrap-Up Group:   The focus of this group is to help patients review their daily goal of treatment and discuss progress on daily workbooks.  Participation Level:  Minimal  Participation Quality:  Appropriate  Affect:  Depressed and Flat  Cognitive:  Appropriate  Insight: Improving  Engagement in Group:  Developing/Improving  Modes of Intervention:  Discussion, Exploration and Support  Additional Comments:  Pt stated that the reason why she is here is because of Suicidal thoughts. Pt stated that this may be a result of a change in her medication.   Eliezer Champagne 10/11/2012, 9:36 PM

## 2012-10-12 DIAGNOSIS — F332 Major depressive disorder, recurrent severe without psychotic features: Principal | ICD-10-CM

## 2012-10-12 DIAGNOSIS — F22 Delusional disorders: Secondary | ICD-10-CM

## 2012-10-12 NOTE — B[facility] Counselor (Signed)
Adult Comprehensive Assessment  Donna Black ID: Donna Black, female   DOB: September 26, 1965, 47 y.o.   MRN: [id number]  Information Source: Information source: Donna Black  Current Stressors:  Educational / Learning stressors: N/A Employment / Job issues: Unemployed, wants to work Family Relationships: marital conflict Surveyor, quantity / Lack of resources (include bankruptcy): N/A Housing / Lack of housing: N/A Physical health (include injuries & life threatening diseases): N/A Social relationships: N/A Substance abuse: N/A Bereavement / Loss: Mother passed away last year  Living/Environment/Situation:  Living Arrangements: Children Living conditions (as described by Donna Black or guardian): Donna Black states that she lives in [place] with husband and son.  Donna Black states that this is a good environment. How long has Donna Black lived in current situation?: 6 years What is atmosphere in current home: Supportive;Loving;Comfortable  Family History:  Marital status: Married Number of Years Married: 13 What types of issues is Donna Black dealing with in the relationship?: Trust issues, arguing Additional relationship information: N/A Does Donna Black have children?: Yes How many children?: 3 How is Donna Black's relationship with their children?: 47 yr old twins and 91 yr old son.  Donna Black states that she has a good relationship with all of them.    Childhood History:  By whom was/is the Donna Black raised?: Both parents Additional childhood history information: Donna Black states that she had a very happy, good childhood.  Description of Donna Black's relationship with caregiver when they were a child: Donna Black states that she got along well with parents growing up. Donna Black's description of current relationship with people who raised him/her: Mother is deceased, good relationship with father.   Does Donna Black have siblings?: Yes Number of Siblings: 1 Description of Donna Black's current relationship with siblings: Brother, states they don't agree on some  things Did Donna Black suffer any verbal/emotional/physical/sexual abuse as a child?: No Did Donna Black suffer from severe childhood neglect?: No Has Donna Black ever been sexually abused/assaulted/raped as an adolescent or adult?: No Was the Donna Black ever a victim of a crime or a disaster?: No Witnessed domestic violence?: No Has Donna Black been effected by domestic violence as an adult?: No  Education:  Highest grade of school Donna Black has completed: Oncologist in Business Currently a student?: No Learning disability?: No  Employment/Work Situation:   Employment situation: Unemployed Donna Black's job has been impacted by current illness: No What is the longest time Donna Black has a held a job?: 7-8 years Where was the Donna Black employed at that time?: [employer] Has Donna Black ever been in the [employer]?: No Has Donna Black ever served in Buyer, retail?: No  Financial Resources:   Financial resources: Income from spouse;Private insurance Does Donna Black have a representative payee or guardian?: No  Alcohol/Substance Abuse:   What has been your use of drugs/alcohol within the last 12 months?: Donna Black denies alcohol and drug abuse If attempted suicide, did drugs/alcohol play a role in this?: No Alcohol/Substance Abuse Treatment Hx: Denies past history If yes, describe treatment: N/A Has alcohol/substance abuse ever caused legal problems?: No  Social Support System:   Donna Black's Community Support System: Good Describe Community Support System: Donna Black states that her friends and family are supportive Type of faith/religion: Catholic How does Donna Black's faith help to cope with current illness?: prayer, church attendance  Leisure/Recreation:   Leisure and Hobbies: read, walk   Strengths/Needs:   What things does the patient do well?: Donna Black states that she is trying to figure out what she is good at right now In what areas does Donna Black struggle / problems for Donna Black: Depression, SI  Discharge Plan:  Does Donna Black  have access to transportation?: Yes Will Donna Black be returning to same living situation after discharge?: Yes Currently receiving community mental health services: Yes (From Whom) (Dr. Lolly Mustache for med mgt) If no, would Donna Black like referral for services when discharged?: Yes (What county?) [facility] [place]) Does Donna Black have financial barriers related to discharge medications?: No  Summary/Recommendations:     Donna Black is a 47 year old Caucasian Female with a diagnosis of Major Depressive Disorder.  Donna Black lives in [place] with her family.  Donna Black states that she had a med change and believes it caused to her have SI.  Donna Black denies any stressors but reports marital conflict.  Donna Black states that she sees Dr. Lolly Mustache outpatient for medication management and will start seeing a new therapist.  Donna Black will benefit from crisis stabilization, medication evaluation, group therapy and psycho education in addition to case management for discharge planning.    [place], Salome Arnt. 10/12/2012

## 2012-10-12 NOTE — Progress Notes (Signed)
D:  Lillis reports that she slept well and that her appetite is good.  She rates depression at 2/3 and hopelessness at 1/10.  She denies any suicidal or homicidal thoughts.  She reports that she is already doing better and is up and active on the unit.  She is attending groups and is interacting appropriately with staff and others. A:  Medications administered as ordered.  Emotional support provided.  Safety checks q 15 minutes. R:  Safety maintained on unit.
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
## 2012-10-12 NOTE — Progress Notes (Signed)
Adult Psychoeducational Group Note  Date:  10/12/2012 Time:  9:00AM  Group Topic/Focus:  Morning Wellness Group  Participation Level:  Active  Participation Quality:  Appropriate and Attentive  Affect:  Appropriate  Cognitive:  Alert and Appropriate  Insight: Appropriate  Engagement in Group:  Engaged  Modes of Intervention:  Discussion  Additional Comments:   Pt. Was attentive and appropriate during Morning Wellness group with nursing staff  April Manson 10/12/2012, 9:47 AM

## 2012-10-12 NOTE — Progress Notes (Signed)
Pt attended Karaoke.  

## 2012-10-12 NOTE — [facility] Group Notes (Addendum)
[facility] LCSW Group Therapy  10/12/2012  1:15 PM   Type of Therapy:  Group Therapy  Participation Level:  Active  Participation Quality:  Appropriate and Attentive  Affect:  Appropriate, Tearful and Depressed  Cognitive:  Alert and Appropriate  Insight:  Developing/Improving and Engaged  Engagement in Therapy:  Developing/Improving and Engaged  Modes of Intervention:  Activity, Clarification, Confrontation, Discussion, Education, Exploration, Limit-setting, Orientation, Problem-solving, Rapport Building, Reality Testing, Socialization and Support  Summary of Progress/Problems: Patient was attentive and engaged with speaker from [employer].  Patient was attentive to speaker while they shared their story of dealing with mental health and overcoming it.  Patient expressed interest in their programs and services and received information on their agency.  Patient processed ways they can relate to the speaker.     Donna Black, LCSWA 10/12/2012 1:42 PM   

## 2012-10-12 NOTE — [facility] Suicide Risk Assessment (Signed)
Suicide Risk Assessment  Admission Assessment     Nursing information obtained from:  Patient Demographic factors:  Caucasian Current Mental Status:  NA Loss Factors:  NA Historical Factors:  Prior suicide attempts;Family history of mental illness or substance abuse Risk Reduction Factors:  Sense of responsibility to family;Living with another person, especially a relative;Positive therapeutic relationship  CLINICAL FACTORS:   Severe Anxiety and/or Agitation Bipolar Disorder:   Depressive phase Depression:   Anhedonia Hopelessness Impulsivity Insomnia Recent sense of peace/wellbeing Severe Alcohol/Substance Abuse/Dependencies Unstable or Poor Therapeutic Relationship Previous Psychiatric Diagnoses and Treatments Medical Diagnoses and Treatments/Surgeries  COGNITIVE FEATURES THAT CONTRIBUTE TO RISK:  Closed-mindedness Loss of executive function Polarized thinking Thought constriction (tunnel vision)    SUICIDE RISK:   Moderate:  Frequent suicidal ideation with limited intensity, and duration, some specificity in terms of plans, no associated intent, good self-control, limited dysphoria/symptomatology, some risk factors present, and identifiable protective factors, including available and accessible social support.  PLAN OF CARE: Admit voluntarily, emergently for Major depressive disorder, delusional disorder and suicidal attempt with Seroquel and Tylenol.   I certify that inpatient services furnished can reasonably be expected to improve the patient's condition.   Nehemiah Settle., MD 10/12/2012, 10:16 AM

## 2012-10-12 NOTE — Progress Notes (Signed)
Adult Psychoeducational Group Note  Date:  10/12/2012 Time:  11:00AM Group Topic/Focus:  Leisure and Lifestyle Changes  Participation Level:  Active  Participation Quality:  Appropriate and Attentive  Affect:  Appropriate  Cognitive:  Alert and Appropriate  Insight: Appropriate  Engagement in Group:  Engaged  Modes of Intervention:  Discussion  Additional Comments:  Pt. Was attentive and appropriate during today's group discussion. Pt. Was able to complete worksheet on Self-Esteem. Pt was was able to write a list of positive things about yourself for each letter A-Z. Pt. Was able to come up with the following words bliss, achievement, and energetic.   Bing Plume D 10/12/2012, 1:04 PM

## 2012-10-12 NOTE — [facility] Suicide Risk Assessment (Deleted)
Suicide Risk Assessment  Admission Assessment     Nursing information obtained from:  Patient Demographic factors:  Caucasian Current Mental Status:  NA Loss Factors:  NA Historical Factors:  Prior suicide attempts;Family history of mental illness or substance abuse Risk Reduction Factors:  Sense of responsibility to family;Living with another person, especially a relative;Positive therapeutic relationship  CLINICAL FACTORS:   Severe Anxiety and/or Agitation Depression:   Anhedonia Comorbid alcohol abuse/dependence Hopelessness Impulsivity Insomnia Recent sense of peace/wellbeing Severe Alcohol/Substance Abuse/Dependencies More than one psychiatric diagnosis Previous Psychiatric Diagnoses and Treatments Medical Diagnoses and Treatments/Surgeries  COGNITIVE FEATURES THAT CONTRIBUTE TO RISK:  Closed-mindedness Loss of executive function Polarized thinking Thought constriction (tunnel vision)    SUICIDE RISK:   Moderate:  Frequent suicidal ideation with limited intensity, and duration, some specificity in terms of plans, no associated intent, good self-control, limited dysphoria/symptomatology, some risk factors present, and identifiable protective factors, including available and accessible social support.  PLAN OF CARE: Admit voluntarily, emergently for depression, anxiety and s/p suicidal attempt with Neurontin (25-30pills)and Nyquil (4).  I certify that inpatient services furnished can reasonably be expected to improve the patient's condition.   Nehemiah Settle., MD 10/12/2012, 9:44 AM

## 2012-10-12 NOTE — Progress Notes (Signed)
Pt is bright in affect and appropriate in mood. Pt observed interacting appropriately within the milieu. Pt is denying any SI/HI. Pt is also negative for anxiety or depression this evening. Per pt, she enjoyed karaoke this evening.  A: Writer administered scheduled and prn medications to pt. Continued support and availability as needed was extended to this pt. Staff continue to monitor pt with q42min checks.  R: No adverse drug reactions noted. Pt receptive to treatment. Pt remains safe at this time.

## 2012-10-12 NOTE — Progress Notes (Addendum)
Patient ID: Donna Black, female   DOB: [birth year], 47 y.o.   MRN: [id number] Pt attended group but then was called out of group to see her doctor.

## 2012-10-13 MED ORDER — ARIPIPRAZOLE 5 MG PO TABS
5.0000 mg | ORAL_TABLET | Freq: Every day | ORAL | Status: DC
  Administered 2012-10-14 – 2012-10-16 (×3): 5 mg via ORAL
  Filled 2012-10-13 (×4): qty 1

## 2012-10-13 NOTE — Progress Notes (Signed)
Child/Adolescent Psychoeducational Group Note  Date:  10/13/2012 Time:  8:59 PM  Group Topic/Focus:  Wrap-Up Group:   The focus of this group is to help patients review their daily goal of treatment and discuss progress on daily workbooks.  Participation Level:  Active  Participation Quality:  Appropriate  Affect:  Appropriate  Cognitive:  Appropriate  Insight:  Appropriate  Engagement in Group:  Engaged and Supportive  Modes of Intervention:  Support  Additional Comments:  Pt stated that one good thing was that she was able to tlk to her children several times today, her goal was to stay positive. She stated that she did achieve that goal by socializing . She stated that when she leaves she plans to exercise and volunteer with women experiencing breast cancer.    Rhonda Linan 10/13/2012, 8:59 PM

## 2012-10-13 NOTE — [facility] Group Notes (Signed)
[facility] LCSW Aftercare Discharge Planning Group Note   10/13/2012 8:45 AM  Participation Quality:  Alert and Appropriate   Mood/Affect:  Appropriate, Flat and Depressed  Depression Rating:  2  Anxiety Rating:  2  Thoughts of Suicide:  Pt denies SI/HI  Will you contract for safety?   Yes  Current AVH:  Pt denies  Plan for Discharge/Comments:  Pt attended discharge planning group and actively participated in group.  CSW provided pt with today's workbook.  Pt reports feeling better and ready to d/c but appears more flat and depressed than she admits.  Pt states that she will return home to family in [place].  Pt will follow up with Dr. Lolly Mustache for medication management.  Pt states that her church chaplain is working on a referral for therapy for her and pt will arrange own therapy.  No further needs voiced by pt at this time.    Transportation Means: Pt reports access to transportation - family will pick pt up  Supports: No supports mentioned at this time  Donna Black, LCSWA 10/13/2012 9:31 AM

## 2012-10-13 NOTE — Progress Notes (Signed)
Adult Psychoeducational Group Note  Date:  10/13/2012 Time:  11:23 AM  Group Topic/Focus:  Relapse Prevention Planning:   The focus of this group is to define relapse and discuss the need for planning to combat relapse.  Participation Level:  Active  Participation Quality:  Appropriate  Affect:  Appropriate and Flat  Cognitive:  Appropriate  Insight: Lacking  Engagement in Group:  Engaged  Modes of Intervention:  Discussion, Exploration and Support

## 2012-10-13 NOTE — [facility] Group Notes (Signed)
[facility] LCSW Group Therapy  10/13/2012  1:15 PM   Type of Therapy:  Group Therapy  Participation Level:  Active  Participation Quality:  Appropriate and Attentive  Affect:  Appropriate, Blunted, Depressed  Cognitive:  Alert and Appropriate  Insight:  Developing/Improving and Engaged  Engagement in Therapy:  Developing/Improving and Engaged  Modes of Intervention:  Clarification, Confrontation, Discussion, Education, Exploration, Limit-setting, Orientation, Problem-solving, Rapport Building, Dance movement psychotherapist, Socialization and Support  Summary of Progress/Problems: The topic for today was feelings about relapse.  Pt discussed what relapse prevention is to them and identified triggers that they are on the path to relapse.  Pt processed their feeling towards relapse and was able to relate to peers.  Pt discussed coping skills that can be used for relapse prevention.   Pt shared that she relapses by drinking and smoking.  Pt states that she has learned to use distraction such as volunteer work or going on a walk.    Donna Black, LCSWA 10/13/2012 1:58 PM

## 2012-10-13 NOTE — Progress Notes (Signed)
[facility] MD Progress Note  10/13/2012 10:29 PM Donna Black  MRN:  [id number]  Subjective:  When my Welbutrin was increased to 300 I continued to have crying spells that came in waves, so I took an overdose of my medication. I was fine be for that. Patient notes her depression is now a 2/10, and her anxiety is a 2./10. She states she has taken Abilify in the past and feels that  It may work for her. She thinks being in the groups is helping her.  Diagnosis:  MDD severe recurrent DSM5: Schizophrenia Disorders:  na Obsessive-Compulsive Disorders:  na Trauma-Stressor Disorders:  na Substance/Addictive Disorders:  na Depressive Disorders:  Major Depressive Disorder - Severe (296.23)    ADL's:  Intact  Sleep: Poor  Appetite:  Fair  Suicidal Ideation:  Comes and goes, no plan or intent Homicidal Ideation:  denies AEB (as evidenced by):  Psychiatric Specialty Exam: Review of Systems  Constitutional: Positive for diaphoresis. Negative for fever, chills, weight loss and malaise/fatigue.  HENT: Negative for congestion and sore throat.   Eyes: Negative for blurred vision, double vision and photophobia.  Respiratory: Negative for cough, shortness of breath and wheezing.   Cardiovascular: Negative for chest pain, palpitations and PND.  Gastrointestinal: Negative for heartburn, nausea, vomiting, abdominal pain, diarrhea and constipation.  Musculoskeletal: Negative for myalgias, joint pain and falls.  Neurological: Negative for dizziness, tingling, tremors, sensory change, speech change, focal weakness, seizures, loss of consciousness, weakness and headaches.  Endo/Heme/Allergies: Negative for polydipsia. Does not bruise/bleed easily.  Psychiatric/Behavioral: Negative for depression, suicidal ideas, hallucinations, memory loss and substance abuse. The patient is not nervous/anxious and does not have insomnia.     Blood pressure 108/70, pulse 116, temperature 98.4 F (36.9 C), temperature  source Oral, resp. rate 16, height 5\' 4"  (1.626 m), weight 78.36 kg (172 lb 12 oz), last menstrual period 09/25/2012.Body mass index is 29.64 kg/(m^2).  General Appearance: Casual  Eye Contact::  Fair  Speech:  Clear and Coherent  Volume:  Normal  Mood:  Anxious and Depressed  Affect:  Congruent  Thought Process:  Goal Directed  Orientation:  Full (Time, Place, and Person)  Thought Content:  WDL  Suicidal Thoughts:  Yes.  without intent/plan  Homicidal Thoughts:  No  Memory:  Immediate;   Fair  Judgement:  Impaired  Insight:  Lacking  Psychomotor Activity:  Decreased  Concentration:  Fair  Recall:  Poor  Akathisia:  No  Handed:  Right  AIMS (if indicated):     Assets:  Communication Skills  Sleep:  Number of Hours: 6.75   Current Medications: Current Facility-Administered Medications  Medication Dose Route Frequency Provider Last Rate Last Dose  . acetaminophen (TYLENOL) tablet 650 mg  650 mg Oral Q6H PRN Kerry Hough, PA-C      . alum & mag hydroxide-simeth (MAALOX/MYLANTA) 200-200-20 MG/5ML suspension 30 mL  30 mL Oral Q4H PRN Kerry Hough, PA-C      . buPROPion (WELLBUTRIN XL) 24 hr tablet 150 mg  150 mg Oral BH-q7a Spencer E Simon, PA-C   150 mg at 10/13/12 0865  . magnesium hydroxide (MILK OF MAGNESIA) suspension 30 mL  30 mL Oral Daily PRN Kerry Hough, PA-C      . risperiDONE (RISPERDAL M-TABS) disintegrating tablet 1 mg  1 mg Oral QHS Kerry Hough, PA-C   1 mg at 10/13/12 2152    Lab Results: No results found for this or any previous visit (from the past 48  hour(s)).  Physical Findings: AIMS: Facial and Oral Movements Muscles of Facial Expression: None, normal Lips and Perioral Area: None, normal Jaw: None, normal Tongue: None, normal,Extremity Movements Upper (arms, wrists, hands, fingers): None, normal Lower (legs, knees, ankles, toes): None, normal, Trunk Movements Neck, shoulders, hips: None, normal, Overall Severity Severity of abnormal movements  (highest score from questions above): None, normal Incapacitation due to abnormal movements: None, normal Patient's awareness of abnormal movements (rate only patient's report): No Awareness, Dental Status Current problems with teeth and/or dentures?: No Does patient usually wear dentures?: No  CIWA:  CIWA-Ar Total: 0 COWS:     Treatment Plan Summary: Daily contact with patient to assess and evaluate symptoms and progress in treatment Medication management  Plan: 1. Continue crisis management and stabilization. 2. Medication management to reduce current symptoms to base line and improve patient's overall level of functioning 3. Treat health problems as indicated. 4. Develop treatment plan to decrease risk of relapse upon discharge and the need for     readmission. 5. Psycho-social education regarding relapse prevention and self care. 6. Health care follow up as needed for medical problems. 7. Continue home medications where appropriate. 8. Will Add Abilify 5mg  to current regimen and follow.  Medical Decision Making Problem Points:  Established problem, stable/improving (1) Data Points:  Review and summation of old records (2)  I certify that inpatient services furnished can reasonably be expected to improve the patient's condition.   MASHBURN,NEIL 10/13/2012, 10:29 PM  Reviewed the information documented and agree with the treatment plan.  Wendie Diskin,JANARDHAHA R. 10/16/2012 5:09 PM

## 2012-10-13 NOTE — [facility] Suicide Risk Assessment (Signed)
[facility] INPATIENT:  Family/Significant Other Suicide Prevention Education  Suicide Prevention Education:  Education Completed; Maryann Conners - daughter [phone]),  (name of family member/significant other) has been identified by the patient as the family member/significant other with whom the patient will be residing, and identified as the person(s) who will aid the patient in the event of a mental health crisis (suicidal ideations/suicide attempt).  With written consent from the patient, the family member/significant other has been provided the following suicide prevention education, prior to the and/or following the discharge of the patient.  The suicide prevention education provided includes the following:  Suicide risk factors  Suicide prevention and interventions  National Suicide Hotline telephone number  [facility] assessment telephone number  [facility] Emergency Assistance 911  [facility] and/or Residential Mobile Crisis Unit telephone number  Request made of family/significant other to:  Remove weapons (e.g., guns, rifles, knives), all items previously/currently identified as safety concern.    Remove drugs/medications (over-the-counter, prescriptions, illicit drugs), all items previously/currently identified as a safety concern.  The family member/significant other verbalizes understanding of the suicide prevention education information provided.  The family member/significant other agrees to remove the items of safety concern listed above.  Daughter went into detail about pt's history and possible diagnosis.  Daughter states that mother (pt) has been diagnosed with schizoaffective and depression.  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  Daughter was also concerned that pt is still delusional and possibly suicidal, due to comments she's made on the phone while in  the hospital.  Daughter states that pt has sent suicide letters to her in the past.    Wilkie Aye, Salome Arnt 10/13/2012, 1:14 PM

## 2012-10-13 NOTE — Tx Team (Signed)
Interdisciplinary Treatment Plan Update (Adult)  Date: 10/13/2012  Time Reviewed:  9:45 AM  Progress in Treatment: Attending groups: Yes Participating in groups:  Yes Taking medication as prescribed:  Yes Tolerating medication:  Yes Family/Significant othe contact made: CSW assessing  Patient understands diagnosis:  Yes Discussing patient identified problems/goals with staff:  Yes Medical problems stabilized or resolved:  Yes Denies suicidal/homicidal ideation: Yes Issues/concerns per patient self-inventory:  Yes Other:  New problem(s) identified: N/A  Discharge Plan or Barriers: CSW assessing for appropriate referrals.  Reason for Continuation of Hospitalization: Anxiety Depression Medication Stabilization  Comments: N/A  Estimated length of stay: 3-5 days  For review of initial/current patient goals, please see plan of care.  Attendees: Patient:     Family:     Physician:  Dr. Johnalagadda 10/13/2012 10:02 AM   Nursing:   Chris Judge, RN 10/13/2012 10:02 AM   Clinical Social Worker:  Marni Franzoni Horton, LCSWA 10/13/2012 10:02 AM   Other: Neil Mashburn, PA 10/13/2012 10:02 AM   Other:  Maseta Dorley, MA care coordination 10/13/2012 10:02 AM   Other:  Quylle Hodnett, LCSW 10/13/2012 10:02 AM   Other:     Other:    Other:    Other:    Other:    Other:    Other:     Scribe for Treatment Team:   Horton, Marquest Gunkel Nicole, 10/13/2012 10:02 AM    

## 2012-10-14 NOTE — Progress Notes (Signed)
Psychoeducational Group Note  Date: 10/14/2012 Time:  1015  Group Topic/Focus:  Identifying Needs:   The focus of this group is to help patients identify their personal needs that have been historically problematic and identify healthy behaviors to address their needs.  Participation Level:  Active  Participation Quality:  Appropriate  Affect:  Appropriate  Cognitive:  Oriented  Insight:  Improving  Engagement in Group:  Engaged  Additional Comments:  Pt participated in the group, supported her peers and was supported by them.   Donna Black A 

## 2012-10-14 NOTE — [facility] Group Notes (Signed)
[facility] Group Notes: (Clinical Social Work)   10/14/2012      Type of Therapy:  Group Therapy   Participation Level:  Did Not Attend    Ambrose Mantle, LCSW 10/14/2012, 4:31 PM

## 2012-10-14 NOTE — Progress Notes (Signed)
Adult Psychoeducational Group Note  Date:  10/14/2012 Time:  9:49 PM  Group Topic/Focus:  Wrap-Up Group:   The focus of this group is to help patients review their daily goal of treatment and discuss progress on daily workbooks.  Participation Level:  Minimal  Participation Quality:  Resistant  Affect:  Appropriate  Cognitive:  Alert  Insight: Limited  Engagement in Group:  Limited  Modes of Intervention:  Discussion  Additional Comments:  Pt didn't set any goals for today and she became tearful when asked how her day was. She did reveal that thinking about her past mistakes gets her upset.   [place] Shi K 10/14/2012, 9:49 PM

## 2012-10-14 NOTE — Progress Notes (Signed)
Patient ID: Donna Black, female   DOB: May 15, 1965, 47 y.o.   MRN: [id number] 10-14-12 nursing shift note: pt has been visible in the milieu today, she has gone to groups and taking her medications. She denied any SI/hi/av. A: she has voice no complaints of pain and voiced no needs. She did have questions about her medications that were answered for her at the medication window. R: medications questions were answered for the pt. She is not having any adverse effects from her medications. On her inventory sheet she wrote: slept well, appetite good, energy normal, attention good with her depression and hopelessness both a 1. RN will support and Q 15 min ck's continue.

## 2012-10-14 NOTE — Progress Notes (Signed)
Patient ID: Donna Black, female   DOB: Mar 07, 1965, 47 y.o.   MRN: [id number]  D: Patient pleasant and smiling on approach tonight. Reports mood improvement tonight. Currently denies any SI at this time. Attending groups and interacting well with peers. A: Staff will monitor on q 15 minute checks, follow treatment plan, and give meds as ordered. R: Took risperdal tonight but physician extender is discontinuing and ordered new meds to start in am.

## 2012-10-14 NOTE — Progress Notes (Signed)
Patient ID: Donna Black, female   DOB: [birth year], 47 y.o.   MRN: [id number] Psychoeducational Group Note  Date:  10/14/2012 Time:1000am  Group Topic/Focus:  Identifying Needs:   The focus of this group is to help patients identify their personal needs that have been historically problematic and identify healthy behaviors to address their needs.  Participation Level:  Active  Participation Quality:  Appropriate  Affect:  Appropriate  Cognitive:  Alert  Insight:  Supportive  Engagement in Group:  Supportive  Additional Comments:    Valente David 10/14/2012,4:05 PM

## 2012-10-14 NOTE — Progress Notes (Signed)
Patient ID: Donna Black, female   DOB: 01-Jun-1965, 47 y.o.   MRN: [id number] D: Client is visible on the unit and is appropriately interactive with select female peers. Affect is restricted but client smiles; describes mood is "improved"; depression 2/10; hopelessness 2/10; appearance is WNL. Client is appropriate and engaging during 1:1 interaction. Denies SI/HI.   A: Continue to encourage group attendance and participation, offer support and assistance in identifying triggers and development of coping skills and encouraged client to share thoughts and feelings to staff. Maintained safety through q 15 minute safety checks by staff.    R: Client states that she feels better now that her wellbutrin does was lowered to 150mg . Her plan is to continue outpatient therapy and find a new therapist so that she can continue to work on the multiple stressors in her life. Client is medication compliant and verbalizes safety to staff.

## 2012-10-14 NOTE — Progress Notes (Signed)
Patient ID: Donna Black, female   DOB: [birth year], 47 y.o.   MRN: [id number] [facility] MD Progress Note  10/14/2012 3:02 PM Janan Bogie  MRN:  [id number] Subjective: Patient is up and doing well in the unit milieu. She notes she slept well, that she feels very calm today. 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  She states she is not really one to share her feelings or her anxieties and has a tendency to internalize and to minimize her stressors.  She is enjoying the groups here, and feels that she has learned a good deal about removing negative stressors from her life. Diagnosis:  MDD severe recurrent DSM5: Schizophrenia Disorders:  na Obsessive-Compulsive Disorders:  na Trauma-Stressor Disorders:  na Substance/Addictive Disorders:  na Depressive Disorders:  Major Depressive Disorder - Severe (296.23)    ADL's:  Intact  Sleep: Poor  Appetite:  Fair  Suicidal Ideation:  Comes and goes, no plan or intent Homicidal Ideation:  denies AEB (as evidenced by):  Psychiatric Specialty Exam: Review of Systems  Constitutional: Positive for diaphoresis. Negative for fever, chills, weight loss and malaise/fatigue.  HENT: Negative for congestion and sore throat.   Eyes: Negative for blurred vision, double vision and photophobia.  Respiratory: Negative for cough, shortness of breath and wheezing.   Cardiovascular: Negative for chest pain, palpitations and PND.  Gastrointestinal: Negative for heartburn, nausea, vomiting, abdominal pain, diarrhea and constipation.  Musculoskeletal: Negative for myalgias, joint pain and falls.  Neurological: Negative for dizziness, tingling, tremors, sensory change, speech change, focal weakness, seizures, loss of consciousness, weakness and headaches.  Endo/Heme/Allergies:  Negative for polydipsia. Does not bruise/bleed easily.  Psychiatric/Behavioral: Negative for depression, suicidal ideas, hallucinations, memory loss and substance abuse. The patient is not nervous/anxious and does not have insomnia.     Blood pressure 113/69, pulse 109, temperature 98.8 F (37.1 C), temperature source Oral, resp. rate 16, height 5\' 4"  (1.626 m), weight 78.36 kg (172 lb 12 oz), last menstrual period 09/25/2012.Body mass index is 29.64 kg/(m^2).  General Appearance: Casual  Eye Contact::  Fair  Speech:  Clear and Coherent  Volume:  Normal  Mood:  Anxious and Depressed  Affect:  Congruent  Thought Process:  Goal Directed  Orientation:  Full (Time, Place, and Person)  Thought Content:  WDL  Suicidal Thoughts:  Yes.  without intent/plan  Homicidal Thoughts:  No  Memory:  Immediate;   Fair  Judgement:  Impaired  Insight:  Lacking  Psychomotor Activity:  Decreased  Concentration:  Fair  Recall:  Poor  Akathisia:  No  Handed:  Right  AIMS (if indicated):     Assets:  Communication Skills  Sleep:  Number of Hours: 6.5   Current Medications: Current Facility-Administered Medications  Medication Dose Route Frequency Provider Last Rate Last Dose  . acetaminophen (TYLENOL) tablet 650 mg  650 mg Oral Q6H PRN Kerry Hough, PA-C      . alum & mag hydroxide-simeth (MAALOX/MYLANTA) 200-200-20 MG/5ML suspension 30 mL  30 mL Oral Q4H PRN Kerry Hough, PA-C      . ARIPiprazole (ABILIFY) tablet 5 mg  5 mg Oral Daily Verne Spurr, PA-C   5 mg at 10/14/12 0815  . buPROPion (WELLBUTRIN XL) 24 hr tablet 150 mg  150 mg Oral BH-q7a Spencer E Simon, PA-C   150 mg at 10/14/12  13  . magnesium hydroxide (MILK OF MAGNESIA) suspension 30 mL  30 mL Oral Daily PRN Kerry Hough, PA-C        Lab Results: No results found for this or any previous visit (from the past 48 hour(s)).  Physical Findings: AIMS: Facial and Oral Movements Muscles of Facial Expression: None, normal Lips and  Perioral Area: None, normal Jaw: None, normal Tongue: None, normal,Extremity Movements Upper (arms, wrists, hands, fingers): None, normal Lower (legs, knees, ankles, toes): None, normal, Trunk Movements Neck, shoulders, hips: None, normal, Overall Severity Severity of abnormal movements (highest score from questions above): None, normal Incapacitation due to abnormal movements: None, normal Patient's awareness of abnormal movements (rate only patient's report): No Awareness, Dental Status Current problems with teeth and/or dentures?: No Does patient usually wear dentures?: No  CIWA:  CIWA-Ar Total: 0 COWS:     Treatment Plan Summary: Daily contact with patient to assess and evaluate symptoms and progress in treatment Medication management  Plan: 1. Continue crisis management and stabilization. 2. Medication management to reduce current symptoms to base line and improve patient's overall level of functioning 3. Treat health problems as indicated. 4. Develop treatment plan to decrease risk of relapse upon discharge and the need for readmission. 5. Psycho-social education regarding relapse prevention and self care. 6. Health care follow up as needed for medical problems. 7. Continue home medications where appropriate. 8. Will continue Abilify 5mg  to current regimen and follow. Medical Decision Making Problem Points:  Established problem, stable/improving (1) Data Points:  Review and summation of old records (2)  I certify that inpatient services furnished can reasonably be expected to improve the patient's condition.   Josha Weekley 10/14/2012, 3:02 PM

## 2012-10-14 NOTE — Progress Notes (Signed)
Pt rescinded her 72 hour form this evening at 1830

## 2012-10-15 MED ORDER — ARIPIPRAZOLE 5 MG PO TABS: 5.0000 mg | ORAL_TABLET | Freq: Every day | ORAL | Status: DC

## 2012-10-15 MED ORDER — BUPROPION HCL ER (XL) 150 MG PO TB24: 150.0000 mg | ORAL_TABLET | ORAL | Status: DC

## 2012-10-15 NOTE — Progress Notes (Signed)
Adult Psychoeducational Group Note  Date:  10/15/2012 Time:  1:54 PM  Group Topic/Focus:  Conflict Resolution:   The focus of this group is to discuss the conflict resolution process and how it may be used upon discharge.  Participation Level:  Active  Participation Quality:  Appropriate  Affect:  Appropriate  Cognitive:  Appropriate  Insight: Appropriate  Engagement in Group:  Improving  Modes of Intervention:  Activity    Elijio Miles 10/15/2012, 1:54 PM

## 2012-10-15 NOTE — Discharge Summary (Signed)
Physician Discharge Summary Note  Patient:  Donna Black is an 47 y.o., female MRN:  [id number] DOB:  Aug 12, 1965 Patient phone:  [phone] (home)  Patient address:   [address] [place] [address],   Date of Admission:  10/11/2012 Date of Discharge: 10/15/2012  Reason for Admission:  Overdose  Discharge Diagnoses: Active Problems:   * No active hospital problems. *  ROS  DSM5: Axis Diagnosis:  Depressive Disorders: Major Depressive Disorder - Severe (296.23)  AXIS I: Major Depression, Recurrent severe and Delusional disorder  AXIS II: Deferred  AXIS III:  Past Medical History   Diagnosis  Date   .  Wears glasses    .  Depression    .  Cancer      bilateral breast cancer   .  Heart murmur      told off and on in past that she has had one   .  Grave's disease      diagnosed about 7 yrs ago but after a year she has been doing well   .  Anemia      when she was younger   .  Complication of anesthesia      ?, heart rate dropped w/wisdom teeth    AXIS IV: housing problems, other psychosocial or environmental problems, problems related to social environment and problems with primary support group  AXIS V: 41-50 serious symptoms     Level of Care:  OP  Hospital Course:  Donna Black was admitted emergently after she overdosed on her medication and wine. She reportedly hoarded her medication for a week then overdosed. She stated that her recent increase in Welbutrin put her "over the edge" she experienced several days of crying with "wave like mood swings."  Donna Black was felt to be in need of acute psychiatric hospitalization for stabilization.     Upon admission to the unit she was evaluated and Abilify was added at the request of her out patient MD. Dr. Lolly Mustache who had done her initial consult while she was in the medical step down unit. She had adamantly refused to take any other medication while she was in patient.   Donna Black, once at the unit agreed to take the  Abilify in conjunction with the regular dose of the Welbutrin 150mg  a day. She was active in unit programming and participated often. She was motivated to return home as soon as possible to be with her 33 year old son on his first day of school this year. She responded well to a therapeutic environment and supportive care. Her symptoms resolved significantly over the weekend and she was able to be discharged home early Monday morning to be with her son. Donna Black noted how much better she felt and that she tolerated the Abilify better than she thought she would. Hers symptoms were reported to be significantly reduced or completely resolved. She was in much improved condition than upon arrival, and she was felt to be stable to discharge home with plans to follow up with Dr. Lolly Mustache in the out patient clinic.  Consults:  None  Significant Diagnostic Studies:  None  Discharge Vitals:   Blood pressure 167/98, pulse 85, temperature 98.8 F (37.1 C), temperature source Oral, resp. rate 16, height 5\' 4"  (1.626 m), weight 78.36 kg (172 lb 12 oz), last menstrual period 09/25/2012. Body mass index is 29.64 kg/(m^2). Lab Results:   No results found for this or any previous visit (from the past 72 hour(s)).  Physical Findings: AIMS: Facial  and Oral Movements Muscles of Facial Expression: None, normal Lips and Perioral Area: None, normal Jaw: None, normal Tongue: None, normal,Extremity Movements Upper (arms, wrists, hands, fingers): None, normal Lower (legs, knees, ankles, toes): None, normal, Trunk Movements Neck, shoulders, hips: None, normal, Overall Severity Severity of abnormal movements (highest score from questions above): None, normal Incapacitation due to abnormal movements: None, normal Patient's awareness of abnormal movements (rate only patient's report): No Awareness, Dental Status Current problems with teeth and/or dentures?: No Does patient usually wear dentures?: No  CIWA:  CIWA-Ar  Total: 0 COWS:     Psychiatric Specialty Exam: See Psychiatric Specialty Exam and Suicide Risk Assessment completed by Attending Physician prior to discharge.  Discharge destination:  Home  Is patient on multiple antipsychotic therapies at discharge:  No   Has Patient had three or more failed trials of antipsychotic monotherapy by history:  No  Recommended Plan for Multiple Antipsychotic Therapies: NA  Discharge Orders   Future Orders Complete By Expires   Diet - low sodium heart healthy  As directed    Discharge instructions  As directed    Comments:     Take all of your medications as directed. Be sure to keep all of your follow up appointments.  If you are unable to keep your follow up appointment, call your Doctor's office to let them know, and reschedule.  Make sure that you have enough medication to last until your appointment. Be sure to get plenty of rest. Going to bed at the same time each night will help. Try to avoid sleeping during the day.  Increase your activity as tolerated. Regular exercise will help you to sleep better and improve your mental health. Eating a heart healthy diet is recommended. Try to avoid salty or fried foods. Be sure to avoid all alcohol and illegal drugs. Patient will call and schedule follow up appointment with Dr. Lolly Mustache on Monday.   Increase activity slowly  As directed        Medication List       Indication   ARIPiprazole 5 MG tablet  Commonly known as:  ABILIFY  Take 1 tablet (5 mg total) by mouth daily.   Indication:  Major Depressive Disorder     buPROPion 150 MG 24 hr tablet  Commonly known as:  WELLBUTRIN XL  Take 1 tablet (150 mg total) by mouth every morning.   Indication:  Major Depressive Disorder           Follow-up Information   Follow up with [facility] On 11/02/2012. (Appointment scheduled at 8:30 am with Dr. Lolly Mustache for medication management)    Contact information:   [address]  [place], [place] [address] Phone: [phone]      Follow-up recommendations:   Activities: Resume activity as tolerated. Diet: Heart healthy low sodium diet Tests: Follow up testing will be determined by your out patient provider. Comments:   Continue to work on the lifestyle changes that could better help manage your mood disorder  Total Discharge Time:  Greater than 30 minutes.  Signed: MASHBURN,NEIL Agree with assessment and plan Madie Reno A. Dub Mikes, M.D. 10/15/2012, 3:44 PM

## 2012-10-15 NOTE — Progress Notes (Signed)
Patient ID: Donna Black, female   DOB: 1965/11/11, 47 y.o.   MRN: [id number] [facility] MD Progress Note  10/15/2012  Donna Black  MRN:  [id number] Subjective: Patient is up and doing well in the unit milieu. Discussed current feelings of depression which the patient continues to rate as 1/10, and her anxiety at 1/10. She appears bright affected, reports good sleep, and has been active in the unit milieu. She is goal directed and positive in her motivation. She is requesting D/C in the AM so she can be home with her son for his first day of school. Patient will see Dr. Dub Mikes today after 3pm to do SRA so that she can leave in the morning at 7AM to see son prior to school. Diagnosis:  MDD severe recurrent DSM5: Schizophrenia Disorders:  na Obsessive-Compulsive Disorders:  na Trauma-Stressor Disorders:  na Substance/Addictive Disorders:  na Depressive Disorders:  Major Depressive Disorder - Severe (296.23) ADL's:  Intact  Sleep: Poor  Appetite:  Fair  Suicidal Ideation:  Comes and goes, no plan or intent Homicidal Ideation:  denies AEB (as evidenced by):  Psychiatric Specialty Exam: Review of Systems  Constitutional: Positive for diaphoresis. Negative for fever, chills, weight loss and malaise/fatigue.  HENT: Negative for congestion and sore throat.   Eyes: Negative for blurred vision, double vision and photophobia.  Respiratory: Negative for cough, shortness of breath and wheezing.   Cardiovascular: Negative for chest pain, palpitations and PND.  Gastrointestinal: Negative for heartburn, nausea, vomiting, abdominal pain, diarrhea and constipation.  Musculoskeletal: Negative for myalgias, joint pain and falls.  Neurological: Negative for dizziness, tingling, tremors, sensory change, speech change, focal weakness, seizures, loss of consciousness, weakness and headaches.  Endo/Heme/Allergies: Negative for polydipsia. Does not bruise/bleed easily.  Psychiatric/Behavioral: Negative for  depression, suicidal ideas, hallucinations, memory loss and substance abuse. The patient is not nervous/anxious and does not have insomnia.     Blood pressure 113/69, pulse 109, temperature 98.8 F (37.1 C), temperature source Oral, resp. rate 16, height 5\' 4"  (1.626 m), weight 78.36 kg (172 lb 12 oz), last menstrual period 09/25/2012.Body mass index is 29.64 kg/(m^2).  General Appearance: Casual  Eye Contact::  Fair  Speech:  Clear and Coherent  Volume:  Normal  Mood:  Anxious and Depressed  Affect:  Congruent  Thought Process:  Goal Directed  Orientation:  Full (Time, Place, and Person)  Thought Content:  WDL  Suicidal Thoughts:  Yes.  without intent/plan  Homicidal Thoughts:  No  Memory:  Immediate;   Fair  Judgement:  Impaired  Insight:  Lacking  Psychomotor Activity:  Decreased  Concentration:  Fair  Recall:  Poor  Akathisia:  No  Handed:  Right  AIMS (if indicated):     Assets:  Communication Skills  Sleep:  Number of Hours: 6.5   Current Medications: Current Facility-Administered Medications  Medication Dose Route Frequency Provider Last Rate Last Dose  . acetaminophen (TYLENOL) tablet 650 mg  650 mg Oral Q6H PRN Kerry Hough, PA-C      . alum & mag hydroxide-simeth (MAALOX/MYLANTA) 200-200-20 MG/5ML suspension 30 mL  30 mL Oral Q4H PRN Kerry Hough, PA-C      . ARIPiprazole (ABILIFY) tablet 5 mg  5 mg Oral Daily Verne Spurr, PA-C   5 mg at 10/14/12 0815  . buPROPion (WELLBUTRIN XL) 24 hr tablet 150 mg  150 mg Oral BH-q7a Spencer E Simon, PA-C   150 mg at 10/14/12 0650  . magnesium hydroxide (MILK OF MAGNESIA) suspension 30  mL  30 mL Oral Daily PRN Kerry Hough, PA-C        Lab Results: No results found for this or any previous visit (from the past 48 hour(s)).  Physical Findings: AIMS: Facial and Oral Movements Muscles of Facial Expression: None, normal Lips and Perioral Area: None, normal Jaw: None, normal Tongue: None, normal,Extremity Movements Upper  (arms, wrists, hands, fingers): None, normal Lower (legs, knees, ankles, toes): None, normal, Trunk Movements Neck, shoulders, hips: None, normal, Overall Severity Severity of abnormal movements (highest score from questions above): None, normal Incapacitation due to abnormal movements: None, normal Patient's awareness of abnormal movements (rate only patient's report): No Awareness, Dental Status Current problems with teeth and/or dentures?: No Does patient usually wear dentures?: No  CIWA:  CIWA-Ar Total: 0 COWS:     Treatment Plan Summary: Daily contact with patient to assess and evaluate symptoms and progress in treatment Medication management  Plan: 1. Continue crisis management and stabilization. 2. Medication management to reduce current symptoms to base line and improve patient's overall level of functioning 3. Treat health problems as indicated. 4. Develop treatment plan to decrease risk of relapse upon discharge and the need for readmission. 5. Psycho-social education regarding relapse prevention and self care. 6. Health care follow up as needed for medical problems. 7. Continue home medications where appropriate. 8. Will continue Abilify 5mg  to current regimen and follow. 9. SRA to be completed today after 3pm so patient may discharge in morning. 10. The patient will call and schedule follow up appointment with Dr. Lolly Mustache. Medical Decision Making Problem Points:  Established problem, stable/improving (1) Data Points:  Review and summation of old records (2)  I certify that inpatient services furnished can reasonably be expected to improve the patient's condition.  Rona Ravens. Corinn Stoltzfus RPAC 3:59 PM 10/15/2012

## 2012-10-15 NOTE — Progress Notes (Signed)
The focus of this group is to help patients review their daily goal of treatment and discuss progress on daily workbooks.  During wrap up group, Donna Black stated that her day today had been "pretty good" because she had a very pleasant visit with her daughter. Patient shared that during the visit, she and her daughter looked through a wedding catalogue for her daughter's upcoming wedding, and that it was a very enjoyable experience. When asked about what kind of support system the patient plans to use when discharged from the hospital, Donna Black stated that she will continue to see her psychiatrist for meds and her psychologist for therapy, as well as continuing to build relationships by attending her new church. When asked to name two positives about herself, Donna Black shared that she loves her three kids deeply and that she has a goal to be more tenacious in the future.

## 2012-10-15 NOTE — [facility] Group Notes (Signed)
[facility] Group Notes:  (Clinical Social Work)  10/15/2012   3:00-4:00PM  Summary of Progress/Problems:   The main focus of today's process group was to   identify the patient's current support system and decide on other supports that can be put in place.  The picture on workbook was used to discuss why additional supports are needed, and a hand-out was distributed with four definitions/levels of support, then used to talk about how patients have given and received all different kinds of support.  An emphasis was placed on using counselor, doctor, therapy groups, 12-step groups, and problem-specific support groups to expand supports.  The patient identified her 2 friends and 2 daughters.  Type of Therapy:  Process Group  Participation Level:  Active  Participation Quality:  Attentive  Affect:  Blunted  Cognitive:  Appropriate  Insight:  Developing/Improving  Engagement in Therapy:  Developing/Improving  Modes of Intervention:  Education,  Support and Processing  Ambrose Mantle, LCSW 10/15/2012, 4:45 PM

## 2012-10-15 NOTE — Progress Notes (Addendum)
Patient ID: Donna Black, female   DOB: 01-18-1966, 47 y.o.   MRN: [id number] 10-15-12 @ 1511 nursing shift note: D: pt is been very cooperative today, gone to groups and is taking her medications with no adverse side effects. She did request b/p be taken in her arms but staff was unable to met this request because she has had a bilateral mastectomy. A: staff remains supportive and continue to encourage this patient. R: she denies any SI. On her inventory sheet she wrote: slept well, appetite good, energy normal, attention good with her depression at 1 and hopelessness at 0. No w/d symptoms or physical problems. After discharge she plans to exercise, get a job and have more time with her family. RN will monitor and Q 15 min ck's continue.

## 2012-10-15 NOTE — [facility] Suicide Risk Assessment (Signed)
Suicide Risk Assessment  Discharge Assessment     Demographic Factors:  Caucasian  Mental Status Per Nursing Assessment::   On Admission:  NA  Current Mental Status by Physician: In full contact with reality. There are no suicidal ideas, plans or intent. Her mood is euthymic, her affect is appropriate. She states she feels much better. Thinks that the increase of Wellbutrin to 300 mg might have led to her SI and symptoms. She plans to continue to work on the relationship with her husband. She is going to continue to see Dr. Lolly Mustache and a new therapist who was recommended by the chaplain.    Loss Factors: NA  Historical Factors: NA  Risk Reduction Factors:   Responsible for children under 97 years of age, Sense of responsibility to family, Living with another person, especially a relative and Positive therapeutic relationship  Continued Clinical Symptoms:  Depression:   Insomnia  Cognitive Features That Contribute To Risk No evidence   Suicide Risk:  Minimal: No identifiable suicidal ideation.  Patients presenting with no risk factors but with morbid ruminations; may be classified as minimal risk based on the severity of the depressive symptoms  Discharge Diagnoses:   AXIS I:  Major Depression recurrent AXIS II:  Deferred AXIS III:   Past Medical History  Diagnosis Date  . Wears glasses   . Depression   . Cancer     bilateral breast cancer  . Heart murmur     told off and on in past that she has had one  . Grave's disease     diagnosed about 7 yrs ago but after a year she has been doing well  . Anemia     when she was younger  . Complication of anesthesia     ?, heart rate dropped w/wisdom teeth   AXIS IV:  problems with primary support group AXIS V:  61-70 mild symptoms  Plan Of Care/Follow-up recommendations:  Activity:  as tolerated Diet:  regular Follow up Dr. Lolly Mustache Is patient on multiple antipsychotic therapies at discharge:  No   Has Patient had three  or more failed trials of antipsychotic monotherapy by history:  No  Recommended Plan for Multiple Antipsychotic Therapies: NA  Sandee Bernath A 10/15/2012, 9:46 PM

## 2012-10-15 NOTE — Progress Notes (Signed)
Patient ID: Donna Black, female   DOB: [birth year], 47 y.o.   MRN: [id number] Psychoeducational Group Note  Date:  10/15/2012 Time:  1100am  Group Topic/Focus:  Making Healthy Choices:   The focus of this group is to help patients identify negative/unhealthy choices they were using prior to admission and identify positive/healthier coping strategies to replace them upon discharge.  Participation Level:  Active  Participation Quality:  Appropriate  Affect:  Appropriate  Cognitive:  Appropriate  Insight:  Supportive  Engagement in Group:  Supportive  Additional Comments:    Valente David 10/15/2012,1:04 PM

## 2012-10-15 NOTE — [facility] Group Notes (Signed)
[facility] Group Notes:  (Nursing/MHT/Case Management/Adjunct)  Date:  10/15/2012  Time:  11:03 AM  Type of Therapy:  Psychoeducational Skills  Participation Level:  Active  Participation Quality:  Appropriate  Affect:  Appropriate  Cognitive:  Appropriate  Insight:  Appropriate  Engagement in Group:  Engaged  Modes of Intervention:  Problem-solving  Summary of Progress/Problems: Pt did attend [employer] Support Systems group, and engaged in treatment.  Jacquelyne Balint Shanta 10/15/2012, 11:03 AM

## 2012-10-16 ENCOUNTER — Telehealth (HOSPITAL_COMMUNITY): Payer: Self-pay | Admitting: *Deleted

## 2012-10-16 ENCOUNTER — Telehealth (HOSPITAL_COMMUNITY): Payer: Self-pay | Admitting: Psychiatry

## 2012-10-16 ENCOUNTER — Telehealth (HOSPITAL_COMMUNITY): Payer: Self-pay

## 2012-10-16 NOTE — Telephone Encounter (Signed)
11:33am 10/16/12 Patient called reqesting an earlier appt stating that she tried to commit suicide and she think it was the medication requesting an earlier appt wthe Dr. Arfeen.//sh

## 2012-10-16 NOTE — Telephone Encounter (Signed)
Advise pt Dr. Lolly Mustache requested this writer contact pt in regards to question about medication as he was unable to contact her at this time due to being with scheduled pt appointments. Patient states she was recently hospitalized. Has now returned home, is coming to see Dr. Lolly Mustache tomorrow because she did not want to wait until her next appt. States she was on Wellbutrin 150 mg for 8 weeks, during which time she only cried 3 times. States Dr. Lolly Mustache increased her dosage to 300 mg. About a week ago, she attempted suicide. Went into hospital, was offered Wellbutrin 300 mg, she refused. Since going home, she is on Wellbutrin 150 mg and Abilify and feels fine. Has concerns related to husband and her healthcare, but wishes to discuss with only Dr. Lolly Mustache. States he knows the whole story. Informed pt that Dr.Arfeen would be advised of this call and her request.

## 2012-10-16 NOTE — Telephone Encounter (Signed)
I returned Patient's phone call She is [facility].  She is concerned because her husband threatening to divorce her if she does not permit him for medical release information.  Patient is concerned that husband is threatening to get her child's custody .  Patient has not given consent to involve her husband in her treatment plan.  I explained that it is her decision if she wants her husband to be involved in her treatment plan.  I strongly encouraged to discuss marital issues with her therapist.  Patient does not want to see her current therapist [name] I like to get a different therapist.  We will schedule appointment to see therapist and this office.  At this time patient does not have any concerns about the medication.  She denied any side effects to like to continue her current psychiatric medication.

## 2012-10-16 NOTE — Progress Notes (Signed)
[facility] Adult Case Management Discharge Plan :  Will you be returning to the same living situation after discharge: Yes,  returning home At discharge, do you have transportation home?:Yes,  family picked pt up Do you have the ability to pay for your medications:Yes,  access to meds  Release of information consent forms completed and in the chart;  Patient's signature needed at discharge.  Patient to Follow up at: Follow-up Information   Follow up with [facility] On 11/02/2012. (Appointment scheduled at 8:30 am with Dr. Lolly Mustache for medication management)    Contact information:   [address] [place], [place] [address] Phone: [phone]      Patient denies SI/HI:   Yes,  denies Si/HI    Safety Planning and Suicide Prevention discussed:  Yes,  discussed with pt and pt's daughter.  See suicide prevention education note.    Pt verbalized plan to schedule own therapy appointment, as she has a recommendation from her chaplain when she returns home.    Carmina Miller 10/16/2012, 8:26 AM

## 2012-10-16 NOTE — Progress Notes (Signed)
Patient has been up and active on the unit, attended group this evening and has voiced no complaints. Patient looking forward to discharge in the morning. Patient currently denies having pain, -si/hi/a/v hall. Support and encouragement offered, safety maintained on unit, will continue to monitor.

## 2012-10-16 NOTE — Progress Notes (Signed)
Writer explained and gave copy of discharge instructions and am meds given before discharge. Patient voiced understanding. Patient refused to sign request for release of medical information. Patients items from locker 32 were all returned. Patient is excited about seeing her son off to school and is in good spirits. Patient denies si/hi/a/v hallucinations

## 2012-10-18 ENCOUNTER — Ambulatory Visit (HOSPITAL_COMMUNITY): Payer: Self-pay | Admitting: Psychiatry

## 2012-10-18 NOTE — Progress Notes (Signed)
Agree with assessment and plan Franciszek Platten A. Lashann Hagg, M.D. 

## 2012-10-18 NOTE — Progress Notes (Signed)
Agree with assessment and plan Nyasia Baxley A. Sherron Mapp, M.D. 

## 2012-10-19 ENCOUNTER — Ambulatory Visit (INDEPENDENT_AMBULATORY_CARE_PROVIDER_SITE_OTHER): Payer: Commercial Managed Care - PPO | Admitting: Psychiatry

## 2012-10-19 ENCOUNTER — Encounter (HOSPITAL_COMMUNITY): Payer: Self-pay | Admitting: Psychiatry

## 2012-10-19 DIAGNOSIS — F329 Major depressive disorder, single episode, unspecified: Secondary | ICD-10-CM

## 2012-10-19 DIAGNOSIS — F22 Delusional disorders: Secondary | ICD-10-CM

## 2012-10-19 DIAGNOSIS — F29 Unspecified psychosis not due to a substance or known physiological condition: Secondary | ICD-10-CM
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
## 2012-10-19 NOTE — Progress Notes (Signed)
Patient Discharge Instructions:  Next Level Care Provider Has Access to the EMR, 10/19/12 Records provided to [facility] via [facility]/Epic access.  Jerelene Redden, 10/19/2012, 3:30 PM

## 2012-10-24 ENCOUNTER — Encounter (HOSPITAL_COMMUNITY): Payer: Self-pay | Admitting: Psychiatry

## 2012-10-24 ENCOUNTER — Ambulatory Visit (INDEPENDENT_AMBULATORY_CARE_PROVIDER_SITE_OTHER): Payer: Commercial Managed Care - PPO | Admitting: Psychiatry

## 2012-10-24 VITALS — BP 127/73 | HR 105 | Ht 64.0 in | Wt 174.8 lb

## 2012-10-24 DIAGNOSIS — F329 Major depressive disorder, single episode, unspecified: Secondary | ICD-10-CM
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
## 2012-10-25 ENCOUNTER — Telehealth (HOSPITAL_COMMUNITY): Payer: Self-pay

## 2012-10-26 ENCOUNTER — Telehealth (HOSPITAL_COMMUNITY): Payer: Self-pay

## 2012-10-26 ENCOUNTER — Encounter (HOSPITAL_COMMUNITY): Payer: Self-pay | Admitting: *Deleted

## 2012-10-26 NOTE — Telephone Encounter (Signed)
10/26/12 3:37pm Patient came and pick-up letter.

## 2012-11-01 ENCOUNTER — Encounter (HOSPITAL_COMMUNITY): Payer: Self-pay | Admitting: Psychiatry

## 2012-11-01 ENCOUNTER — Ambulatory Visit (INDEPENDENT_AMBULATORY_CARE_PROVIDER_SITE_OTHER): Payer: Commercial Managed Care - PPO | Admitting: Psychiatry

## 2012-11-01 DIAGNOSIS — F22 Delusional disorders: Secondary | ICD-10-CM

## 2012-11-01 DIAGNOSIS — F329 Major depressive disorder, single episode, unspecified: Secondary | ICD-10-CM

## 2012-11-01 NOTE — Progress Notes (Signed)
   THERAPIST PROGRESS NOTE  Session Time: 11:00-11:50  Participation Level: Active  Behavioral Response: CasualAlertDysphoric  Type of Therapy: Individual Therapy  Treatment Goals addressed: emotion regulation, stress management, decision making  Interventions: CBT  Summary: Donna Black is a 47 y.o. female who presents with schizoaffective.   Suicidal/Homicidal: Nowithout intent/plan  Therapist Response: Pt. Focused on marital stress and concerns about son [name]). Pt. Reports feeling that husband is controlling. Pt. Reported communications with son's therapist and marital therapy. Pt. Reports that she is not legally separated from her husband, but leaves the home every night and is staying at a hotel at his request. Discussed Pt.'s anger regarding how husband's behavior and tension in marital relationship are affecting her son. Encouraged Pt. To focus on managing her response to her husband's behavior. I asked Pt. To focus on identifying and developing interests that have been dormant during depression. Pt. Did not report any paranoia or disturbance from delusions during this session.  Plan: Return again in 2 weeks.  Diagnosis: Axis I: Schizoaffective Disorder    Axis II: No diagnosis    [name] 11/01/2012

## 2012-11-02 ENCOUNTER — Ambulatory Visit (INDEPENDENT_AMBULATORY_CARE_PROVIDER_SITE_OTHER): Payer: Commercial Managed Care - PPO | Admitting: Psychiatry

## 2012-11-02 ENCOUNTER — Encounter (HOSPITAL_COMMUNITY): Payer: Self-pay | Admitting: Psychiatry

## 2012-11-02 VITALS — BP 138/96 | HR 76 | Ht 65.0 in | Wt 175.4 lb

## 2012-11-02 DIAGNOSIS — F331 Major depressive disorder, recurrent, moderate: Secondary | ICD-10-CM

## 2012-11-02 DIAGNOSIS — F329 Major depressive disorder, single episode, unspecified: Secondary | ICD-10-CM

## 2012-11-02 MED ORDER — BUPROPION HCL ER (XL) 150 MG PO TB24: 150.0000 mg | ORAL_TABLET | ORAL | Status: DC

## 2012-11-02 MED ORDER — ARIPIPRAZOLE 15 MG PO TABS: ORAL_TABLET | ORAL | Status: DC
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
## 2012-11-08 ENCOUNTER — Encounter (HOSPITAL_COMMUNITY): Payer: Self-pay | Admitting: Psychiatry

## 2012-11-08 ENCOUNTER — Ambulatory Visit (INDEPENDENT_AMBULATORY_CARE_PROVIDER_SITE_OTHER): Payer: Commercial Managed Care - PPO | Admitting: Psychiatry

## 2012-11-08 DIAGNOSIS — F329 Major depressive disorder, single episode, unspecified: Secondary | ICD-10-CM
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
## 2012-11-09 ENCOUNTER — Other Ambulatory Visit (HOSPITAL_COMMUNITY): Payer: Self-pay | Admitting: Psychiatry

## 2012-11-09 ENCOUNTER — Telehealth (HOSPITAL_COMMUNITY): Payer: Self-pay

## 2012-11-09 ENCOUNTER — Ambulatory Visit (INDEPENDENT_AMBULATORY_CARE_PROVIDER_SITE_OTHER): Payer: Commercial Managed Care - PPO | Admitting: Emergency Medicine

## 2012-11-09 ENCOUNTER — Telehealth (HOSPITAL_COMMUNITY): Payer: Self-pay | Admitting: Psychiatry

## 2012-11-09 VITALS — BP 142/88 | HR 89 | Temp 98.3°F | Resp 20 | Ht 64.25 in | Wt 178.6 lb

## 2012-11-09 DIAGNOSIS — F331 Major depressive disorder, recurrent, moderate: Secondary | ICD-10-CM

## 2012-11-09 DIAGNOSIS — F4321 Adjustment disorder with depressed mood: Secondary | ICD-10-CM

## 2012-11-09 MED ORDER — ARIPIPRAZOLE 5 MG PO TABS: 5.0000 mg | ORAL_TABLET | Freq: Every day | ORAL | Status: DC

## 2012-11-09 NOTE — Telephone Encounter (Signed)
I returned patient's phone call.  She does not want 7.5 mg Abilify and wants to go back on 5 mg

## 2012-11-09 NOTE — Progress Notes (Signed)
  Subjective:    Patient ID: Donna Black, female    DOB: Aug 17, 1965, 47 y.o.   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 At that time there was concern it could be due to the medications she was on as she was on Abilify and Prozac. At that time her Prozac and Abilify were put on hold and she was sent back to her psychiatrist Dr. are seen to check her. She has one outbreak since that time. She currently is on Abilify and Wellbutrin. She is requesting drug screening upper blood she had done 2 years ago for other drugs. She has specific concerns about Rohypnol. She is currently under treatment for major depressive disorder and delusional disorder. Her current psychiatrist is Dr. Adele Schilder    Review of Systems     Objective:   Physical Exam patient is alert and cooperative and oriented.        Assessment & Plan:   She also was concerned about what would go in the chart. I  gave her information about Rohypnol. I asked if I could call her psychiatrist and discuss the situation with him. She stated she did not want me to call him. I told her I would be happy to test her for other drugs if she felt that was an issue. She agrees to return if she has any other concerns. She currently is not in the home at night with her husband. She has been out of the home for 3 weeks at night and only visits during the day to see her children. She denied any suicidal thoughts or ideations. I did discuss her situation with her counselor [name] so she is aware of the situation .

## 2012-11-14 ENCOUNTER — Ambulatory Visit (HOSPITAL_COMMUNITY): Payer: Self-pay | Admitting: Psychiatry

## 2012-11-15 ENCOUNTER — Ambulatory Visit (HOSPITAL_COMMUNITY): Payer: Self-pay | Admitting: Psychiatry

## 2012-11-20 ENCOUNTER — Encounter (HOSPITAL_COMMUNITY): Payer: Self-pay | Admitting: Psychiatry

## 2012-11-20 ENCOUNTER — Ambulatory Visit (INDEPENDENT_AMBULATORY_CARE_PROVIDER_SITE_OTHER): Payer: Commercial Managed Care - PPO | Admitting: Psychiatry

## 2012-11-20 VITALS — BP 138/119 | HR 111 | Ht 65.0 in | Wt 180.0 lb

## 2012-11-20 DIAGNOSIS — F329 Major depressive disorder, single episode, unspecified: Secondary | ICD-10-CM

## 2012-11-20 DIAGNOSIS — F331 Major depressive disorder, recurrent, moderate: Secondary | ICD-10-CM

## 2012-11-20 MED ORDER — HYDROXYZINE PAMOATE 25 MG PO CAPS: 25.0000 mg | ORAL_CAPSULE | ORAL | Status: DC | PRN

## 2012-11-20 NOTE — Progress Notes (Signed)
[facility] [id number] Progress Note  Donna Black [id number] 47 y.o.  11/20/2012 2:10 PM  Chief Complaint:  Medication management and followup.           History of Present Illness: Patient is 47 year old Caucasian married female who came in for her appointment.  She continues to struggle with her husband .  She is upset because yesterday she wanted to stay with her son at home however her husband threatening her that he will take his son .  This morning her husband called police when patient refused to leave home however police returned without having more issues.  Patient admitted sometime very nervous and anxious about the future because she does not want to live away from her son.  She denies any hallucination, paranoia or any irritability but admitted anxiety and nervousness.  She continues to stay at night sometimes with her family and sometime in hotel.  She is trying to get legal help , her lawyer is pregnant.  She seeing therapist regularly.  She's also taking her Abilify however she reduced the dose to 5 mg because she felt weird when she took 7.5 mg.  Patient endorsed that she has a lot of pressure from her husband to take more doses of the medication.  Patient feels that she does not needed.  Patient denies any suicidal thoughts or homicidal thoughts.  However she continued to believe that her husband is spying on her because her husband knows everything about her when she stays at home during the day.  Patient does not have any trust on her husband.  Patient denies any recent hallucination but admitted more stressed and anxious related to her son.  Today patient appears very anxious because police was called in. Patient denies any tremors or shakes.  She is comfortable with Abilify 5 mg and Wellbutrin XL 150 mg daily.   Suicidal Ideation: No Plan Formed: No Patient has means to carry out plan: No  Homicidal Ideation: No Plan Formed: No Patient has means to carry out plan:  No  Review of Systems  Constitutional: Negative.   Eyes: Negative.   Respiratory: Negative.   Cardiovascular: Negative.   Musculoskeletal: Negative.   Neurological: Negative.   Psychiatric/Behavioral: Negative for suicidal ideas. The patient is nervous/anxious.    Psychiatric: Agitation: No Hallucination: No Depressed Mood: Yes Insomnia: Yes Hypersomnia: No Altered Concentration: No Feels Worthless: No Grandiose Ideas: No Belief In Special Powers: No New/Increased Substance Abuse: No Compulsions: No  Neurologic: Headache: No Seizure: No Paresthesias: No  Past Medical History  Diagnosis Date  . Wears glasses   . Depression   . Cancer     bilateral breast cancer  . Heart murmur     told off and on in past that she has had one  . Grave's disease     diagnosed about 7 yrs ago but after a year she has been doing well  . Anemia     when she was younger  . Complication of anesthesia     ?, heart rate dropped w/wisdom teeth   She see Dr Johnn Hai at [facility].   Past psychiatric history. Patient has been seeing in this office since 2009.  She was admitted at [facility] due to suicidal attempt when she took overdose on her medication.  At that time she was taking Prozac which was stopped.    Social History: Patient lives with her husband.  Outpatient Encounter Prescriptions as of 11/20/2012  Medication  Sig Dispense Refill  . ARIPiprazole (ABILIFY) 5 MG tablet Take 1 tablet (5 mg total) by mouth daily. Patient wants 5 mg (not 7.5 mg)  30 tablet  0  . buPROPion (WELLBUTRIN XL) 150 MG 24 hr tablet Take 1 tablet (150 mg total) by mouth every morning.  30 tablet  0  . hydrOXYzine (VISTARIL) 25 MG capsule Take 1 capsule (25 mg total) by mouth as needed for anxiety. Take 1 capsule as needed for anxiety and insomnia  30 capsule  0  . [DISCONTINUED] hydrOXYzine (VISTARIL) 25 MG capsule Take 1 capsule (25 mg total) by mouth as needed for anxiety. Take  1 capsule as needed for anxiety and insomnia  30 capsule  0   No facility-administered encounter medications on file as of 11/20/2012.   Recent Results (from the past 2160 hour(s))  CBC     Status: None   Collection Time    10/09/12  2:39 PM      Result Value Range   WBC 6.4  4.0 - 10.5 K/uL   RBC 4.25  3.87 - 5.11 MIL/uL   Hemoglobin 14.4  12.0 - 15.0 g/dL   HCT 16.1  09.6 - 04.5 %   MCV 95.3  78.0 - 100.0 fL   MCH 33.9  26.0 - 34.0 pg   MCHC 35.6  30.0 - 36.0 g/dL   RDW 40.9  81.1 - 91.4 %   Platelets 255  150 - 400 K/uL  COMPREHENSIVE METABOLIC PANEL     Status: Abnormal   Collection Time    10/09/12  2:39 PM      Result Value Range   Sodium 137  135 - 145 mEq/L   Potassium 3.2 (*) 3.5 - 5.1 mEq/L   Chloride 100  96 - 112 mEq/L   CO2 16 (*) 19 - 32 mEq/L   Glucose, Bld 91  70 - 99 mg/dL   BUN 11  6 - 23 mg/dL   Creatinine, Ser 7.82  0.50 - 1.10 mg/dL   Calcium 9.2  8.4 - 95.6 mg/dL   Total Protein 7.2  6.0 - 8.3 g/dL   Albumin 3.9  3.5 - 5.2 g/dL   AST 24  0 - 37 U/L   ALT 15  0 - 35 U/L   Alkaline Phosphatase 48  39 - 117 U/L   Total Bilirubin 0.3  0.3 - 1.2 mg/dL   GFR calc non Af Amer >90  >90 mL/min   GFR calc Af Amer >90  >90 mL/min   Comment: (NOTE)     The eGFR has been calculated using the CKD EPI equation.     This calculation has not been validated in all clinical situations.     eGFR's persistently <90 mL/min signify possible Chronic Kidney     Disease.  ACETAMINOPHEN LEVEL     Status: Abnormal   Collection Time    10/09/12  2:39 PM      Result Value Range   Acetaminophen (Tylenol), Serum 161.0 (*) 10 - 30 ug/mL   Comment: MODERATE HEMOLYSIS     HEMOLYSIS AT THIS LEVEL MAY AFFECT RESULT                THERAPEUTIC CONCENTRATIONS VARY     SIGNIFICANTLY. A RANGE OF 10-30     ug/mL MAY BE AN EFFECTIVE     CONCENTRATION FOR MANY PATIENTS.     HOWEVER, SOME ARE BEST TREATED     AT CONCENTRATIONS OUTSIDE THIS  RANGE.     ACETAMINOPHEN CONCENTRATIONS      >150 ug/mL AT 4 HOURS AFTER     INGESTION AND >50 ug/mL AT 12     HOURS AFTER INGESTION ARE     OFTEN ASSOCIATED WITH TOXIC     REACTIONS.     CRITICAL RESULT CALLED TO, READ BACK BY AND VERIFIED WITH:     JEFFRIES,T.RN @1555  10/09/2012 BY WELLS,D.  SALICYLATE LEVEL     Status: Abnormal   Collection Time    10/09/12  2:39 PM      Result Value Range   Salicylate Lvl <2.0 (*) 2.8 - 20.0 mg/dL  ETHANOL     Status: Abnormal   Collection Time    10/09/12  2:39 PM      Result Value Range   Alcohol, Ethyl (B) 55 (*) 0 - 11 mg/dL   Comment:            LOWEST DETECTABLE LIMIT FOR     SERUM ALCOHOL IS 11 mg/dL     FOR MEDICAL PURPOSES ONLY  URINE RAPID DRUG SCREEN (HOSP PERFORMED)     Status: None   Collection Time    10/09/12  3:36 PM      Result Value Range   Opiates NONE DETECTED  NONE DETECTED   Cocaine NONE DETECTED  NONE DETECTED   Benzodiazepines NONE DETECTED  NONE DETECTED   Amphetamines NONE DETECTED  NONE DETECTED   Tetrahydrocannabinol NONE DETECTED  NONE DETECTED   Barbiturates NONE DETECTED  NONE DETECTED   Comment:            DRUG SCREEN FOR MEDICAL PURPOSES     ONLY.  IF CONFIRMATION IS NEEDED     FOR ANY PURPOSE, NOTIFY LAB     WITHIN 5 DAYS.                LOWEST DETECTABLE LIMITS     FOR URINE DRUG SCREEN     Drug Class       Cutoff (ng/mL)     Amphetamine      1000     Barbiturate      200     Benzodiazepine   200     Tricyclics       300     Opiates          300     Cocaine          300     THC              50  URINALYSIS, ROUTINE W REFLEX MICROSCOPIC     Status: Abnormal   Collection Time    10/09/12  3:36 PM      Result Value Range   Color, Urine YELLOW  YELLOW   APPearance CLEAR  CLEAR   Specific Gravity, Urine >1.046 (*) 1.005 - 1.030   pH 5.0  5.0 - 8.0   Glucose, UA NEGATIVE  NEGATIVE mg/dL   Hgb urine dipstick NEGATIVE  NEGATIVE   Bilirubin Urine SMALL (*) NEGATIVE   Ketones, ur 40 (*) NEGATIVE mg/dL   Protein, ur NEGATIVE  NEGATIVE  mg/dL   Urobilinogen, UA 0.2  0.0 - 1.0 mg/dL   Nitrite NEGATIVE  NEGATIVE   Leukocytes, UA NEGATIVE  NEGATIVE   Comment: MICROSCOPIC NOT DONE ON URINES WITH NEGATIVE PROTEIN, BLOOD, LEUKOCYTES, NITRITE, OR GLUCOSE <1000 mg/dL.  PROTIME-INR     Status: None   Collection Time    10/09/12  3:39  PM      Result Value Range   Prothrombin Time 12.9  11.6 - 15.2 seconds   INR 0.99  0.00 - 1.49  MRSA PCR SCREENING     Status: None   Collection Time    10/09/12  6:27 PM      Result Value Range   MRSA by PCR NEGATIVE  NEGATIVE   Comment:            The GeneXpert MRSA Assay (FDA     approved for NASAL specimens     only), is one component of a     comprehensive MRSA colonization     surveillance program. It is not     intended to diagnose MRSA     infection nor to guide or     monitor treatment for     MRSA infections.     Performed at [facility]  LACTIC ACID, PLASMA     Status: Abnormal   Collection Time    10/09/12  6:55 PM      Result Value Range   Lactic Acid, Venous 2.5 (*) 0.5 - 2.2 mmol/L  ACETAMINOPHEN LEVEL     Status: Abnormal   Collection Time    10/09/12  6:55 PM      Result Value Range   Acetaminophen (Tylenol), Serum 136.3 (*) 10 - 30 ug/mL   Comment:            THERAPEUTIC CONCENTRATIONS VARY     SIGNIFICANTLY. A RANGE OF 10-30     ug/mL MAY BE AN EFFECTIVE     CONCENTRATION FOR MANY PATIENTS.     HOWEVER, SOME ARE BEST TREATED     AT CONCENTRATIONS OUTSIDE THIS     RANGE.     ACETAMINOPHEN CONCENTRATIONS     >150 ug/mL AT 4 HOURS AFTER     INGESTION AND >50 ug/mL AT 12     HOURS AFTER INGESTION ARE     OFTEN ASSOCIATED WITH TOXIC     REACTIONS.  HEPATIC FUNCTION PANEL     Status: Abnormal   Collection Time    10/09/12  6:55 PM      Result Value Range   Total Protein 6.5  6.0 - 8.3 g/dL   Albumin 3.1 (*) 3.5 - 5.2 g/dL   AST 17  0 - 37 U/L   ALT 14  0 - 35 U/L   Alkaline Phosphatase 40  39 - 117 U/L   Total Bilirubin 0.2 (*) 0.3 - 1.2 mg/dL    Bilirubin, Direct <1.6  0.0 - 0.3 mg/dL   Indirect Bilirubin NOT CALCULATED  0.3 - 0.9 mg/dL  COMPREHENSIVE METABOLIC PANEL     Status: Abnormal   Collection Time    10/10/12  3:40 AM      Result Value Range   Sodium 135  135 - 145 mEq/L   Potassium 3.2 (*) 3.5 - 5.1 mEq/L   Chloride 104  96 - 112 mEq/L   CO2 17 (*) 19 - 32 mEq/L   Glucose, Bld 94  70 - 99 mg/dL   BUN 7  6 - 23 mg/dL   Creatinine, Ser 1.09  0.50 - 1.10 mg/dL   Calcium 8.0 (*) 8.4 - 10.5 mg/dL   Total Protein 5.9 (*) 6.0 - 8.3 g/dL   Albumin 2.9 (*) 3.5 - 5.2 g/dL   AST 22  0 - 37 U/L   ALT 16  0 - 35 U/L   Alkaline Phosphatase 37 (*)  39 - 117 U/L   Total Bilirubin 0.4  0.3 - 1.2 mg/dL   GFR calc non Af Amer >90  >90 mL/min   GFR calc Af Amer >90  >90 mL/min   Comment: (NOTE)     The eGFR has been calculated using the CKD EPI equation.     This calculation has not been validated in all clinical situations.     eGFR's persistently <90 mL/min signify possible Chronic Kidney     Disease.  PROTIME-INR     Status: Abnormal   Collection Time    10/10/12  3:40 AM      Result Value Range   Prothrombin Time 15.6 (*) 11.6 - 15.2 seconds   INR 1.27  0.00 - 1.49  ACETAMINOPHEN LEVEL     Status: None   Collection Time    10/10/12  3:40 AM      Result Value Range   Acetaminophen (Tylenol), Serum <15.0  10 - 30 ug/mL   Comment:            THERAPEUTIC CONCENTRATIONS VARY     SIGNIFICANTLY. A RANGE OF 10-30     ug/mL MAY BE AN EFFECTIVE     CONCENTRATION FOR MANY PATIENTS.     HOWEVER, SOME ARE BEST TREATED     AT CONCENTRATIONS OUTSIDE THIS     RANGE.     ACETAMINOPHEN CONCENTRATIONS     >150 ug/mL AT 4 HOURS AFTER     INGESTION AND >50 ug/mL AT 12     HOURS AFTER INGESTION ARE     OFTEN ASSOCIATED WITH TOXIC     REACTIONS.  CBC     Status: None   Collection Time    10/10/12  3:40 AM      Result Value Range   WBC 8.7  4.0 - 10.5 K/uL   RBC 4.11  3.87 - 5.11 MIL/uL   Hemoglobin 13.5  12.0 - 15.0 g/dL   HCT  56.2  13.0 - 86.5 %   MCV 95.9  78.0 - 100.0 fL   MCH 32.8  26.0 - 34.0 pg   MCHC 34.3  30.0 - 36.0 g/dL   RDW 78.4  69.6 - 29.5 %   Platelets 258  150 - 400 K/uL  PROTIME-INR     Status: None   Collection Time    10/10/12  3:46 PM      Result Value Range   Prothrombin Time 14.7  11.6 - 15.2 seconds   INR 1.17  0.00 - 1.49  COMPREHENSIVE METABOLIC PANEL     Status: Abnormal   Collection Time    10/10/12  3:46 PM      Result Value Range   Sodium 136  135 - 145 mEq/L   Potassium 3.9  3.5 - 5.1 mEq/L   Comment: REPEATED TO VERIFY     DELTA CHECK NOTED   Chloride 106  96 - 112 mEq/L   CO2 18 (*) 19 - 32 mEq/L   Glucose, Bld 87  70 - 99 mg/dL   BUN 5 (*) 6 - 23 mg/dL   Creatinine, Ser 2.84  0.50 - 1.10 mg/dL   Calcium 8.6  8.4 - 13.2 mg/dL   Total Protein 6.1  6.0 - 8.3 g/dL   Albumin 3.0 (*) 3.5 - 5.2 g/dL   AST 15  0 - 37 U/L   ALT 13  0 - 35 U/L   Alkaline Phosphatase 39  39 - 117 U/L   Total  Bilirubin 0.3  0.3 - 1.2 mg/dL   GFR calc non Af Amer >90  >90 mL/min   GFR calc Af Amer >90  >90 mL/min   Comment: (NOTE)     The eGFR has been calculated using the CKD EPI equation.     This calculation has not been validated in all clinical situations.     eGFR's persistently <90 mL/min signify possible Chronic Kidney     Disease.  ACETAMINOPHEN LEVEL     Status: None   Collection Time    10/10/12  3:46 PM      Result Value Range   Acetaminophen (Tylenol), Serum <15.0  10 - 30 ug/mL   Comment:            THERAPEUTIC CONCENTRATIONS VARY     SIGNIFICANTLY. A RANGE OF 10-30     ug/mL MAY BE AN EFFECTIVE     CONCENTRATION FOR MANY PATIENTS.     HOWEVER, SOME ARE BEST TREATED     AT CONCENTRATIONS OUTSIDE THIS     RANGE.     ACETAMINOPHEN CONCENTRATIONS     >150 ug/mL AT 4 HOURS AFTER     INGESTION AND >50 ug/mL AT 12     HOURS AFTER INGESTION ARE     OFTEN ASSOCIATED WITH TOXIC     REACTIONS.  COMPREHENSIVE METABOLIC PANEL     Status: Abnormal   Collection Time     10/11/12  4:20 AM      Result Value Range   Sodium 135  135 - 145 mEq/L   Potassium 3.5  3.5 - 5.1 mEq/L   Chloride 106  96 - 112 mEq/L   CO2 18 (*) 19 - 32 mEq/L   Glucose, Bld 82  70 - 99 mg/dL   BUN 5 (*) 6 - 23 mg/dL   Creatinine, Ser 6.21  0.50 - 1.10 mg/dL   Calcium 8.7  8.4 - 30.8 mg/dL   Total Protein 6.0  6.0 - 8.3 g/dL   Albumin 3.0 (*) 3.5 - 5.2 g/dL   AST 13  0 - 37 U/L   ALT 12  0 - 35 U/L   Alkaline Phosphatase 39  39 - 117 U/L   Total Bilirubin 0.1 (*) 0.3 - 1.2 mg/dL   GFR calc non Af Amer >90  >90 mL/min   GFR calc Af Amer >90  >90 mL/min   Comment: (NOTE)     The eGFR has been calculated using the CKD EPI equation.     This calculation has not been validated in all clinical situations.     eGFR's persistently <90 mL/min signify possible Chronic Kidney     Disease.  PROTIME-INR     Status: None   Collection Time    10/11/12  4:20 AM      Result Value Range   Prothrombin Time 13.5  11.6 - 15.2 seconds   INR 1.05  0.00 - 1.49    Past Psychiatric History/Hospitalization(s): Anxiety: Yes Bipolar Disorder: No Depression: Yes Mania: No Psychosis: Yes Schizophrenia: No Personality Disorder: No Hospitalization for psychiatric illness: Yes History of Electroconvulsive Shock Therapy: No Prior Suicide Attempts: Yes  Physical Exam: Constitutional:  BP 138/119  Pulse 111  Ht 5\' 5"  (1.651 m)  Wt 180 lb (81.647 kg)  BMI 29.95 kg/m2  LMP 11/06/2012  General Appearance: well nourished but tired.    Musculoskeletal: Strength & Muscle Tone: within normal limits Gait & Station: normal Patient leans: N/A  Psychiatric: Speech (describe rate,  volume, coherence, spontaneity, and abnormalities if any): Clear and coherent.  Thought Process (describe rate, content, abstract reasoning, and computation): Logical and goal-directed.  Associations: Coherent, Relevant and Intact  Thoughts: normal  Mental Status: Orientation: oriented to person, place, time/date  and situation Mood & Affect: depressed affect and anxiety Attention Span & Concentration: Fair  Medical Decision Making (Choose Three): Established Problem, Stable/Improving (1), Review of Psycho-Social Stressors (1), Review of Last Therapy Session (1), Review of Medication Regimen & Side Effects (2) and Review of New Medication or Change in Dosage (2)  Assessment: Axis I: Maj. depressive disorder  Axis II: Deferred  Axis III: See medical history  Axis IV: Mild  Axis V: 65-70   Plan:  I recommend to continue Abilify 5 mg and Wellbutrin XL 150 mg daily.  At this time patient does not have any side effects.  I will also order Vistaril 25 mg as needed for anxiety and insomnia .  Recommend to see therapist for coping and social skills.  Patient has appointment with this writer with her husband however she is not sure to keep that appointment if husband to file for separation.  I recommend to call us back if she has any question or any concern.  Followup in 2-4 weeks.  Discuss safety plan that anytime having active suicidal thoughts or homicidal thoughts then patient need to call 911 or go to the local emergency room.  Govanni Plemons T., MD 11/20/2012

## 2012-11-27 ENCOUNTER — Ambulatory Visit (INDEPENDENT_AMBULATORY_CARE_PROVIDER_SITE_OTHER): Payer: Commercial Managed Care - PPO | Admitting: Psychiatry

## 2012-11-27 ENCOUNTER — Encounter (HOSPITAL_COMMUNITY): Payer: Self-pay | Admitting: Psychiatry

## 2012-11-27 DIAGNOSIS — F329 Major depressive disorder, single episode, unspecified: Secondary | ICD-10-CM

## 2012-11-27 NOTE — Progress Notes (Signed)
Patient ID: Donna Black, female   DOB: 1966/01/22, 47 y.o.   MRN: [id number]  Session Time: 11:00-11:50   Participation Level: Active   Behavioral Response: CasualAlertEuthymic   Type of Therapy: Individual Therapy   Treatment Goals addressed: emotion regulation, stress management, decision making   Interventions: CBT   Summary: [name] is a 47 y.o. female who presents with major depressive disorder.   Suicidal/Homicidal: Nowithout intent/plan   Therapist Response: Pt. Reports that her husband informed her that he filed for separation and full custody of her son.  Pt. Continues to be focused on her concerns of how the separation will affect her son.  Pt. 's current stress is related to fears that husband is attempting to alienate her from her son and belief that her husband has made comments to her son about her illness that might make him fearful of her. Encouraged pt. To journal her feelings and record her daily schedule so that she can begin to see patterns of accomplishments related to positive interactions with others, career, and her health.  Plan: Return again in 2 weeks.   Diagnosis: Axis I: Major depressive disorder   Axis II: No diagnosis  [name]  11/27/2012

## 2012-11-28 ENCOUNTER — Encounter (HOSPITAL_COMMUNITY): Payer: Self-pay | Admitting: Psychiatry

## 2012-11-28 ENCOUNTER — Ambulatory Visit (INDEPENDENT_AMBULATORY_CARE_PROVIDER_SITE_OTHER): Payer: Commercial Managed Care - PPO | Admitting: Psychiatry

## 2012-11-28 VITALS — BP 124/88 | HR 90 | Ht 65.0 in | Wt 177.0 lb

## 2012-11-28 DIAGNOSIS — F329 Major depressive disorder, single episode, unspecified: Secondary | ICD-10-CM

## 2012-11-28 DIAGNOSIS — F331 Major depressive disorder, recurrent, moderate: Secondary | ICD-10-CM

## 2012-11-28 MED ORDER — ARIPIPRAZOLE 5 MG PO TABS: ORAL_TABLET | ORAL | Status: DC

## 2012-11-28 NOTE — Progress Notes (Signed)
[facility] [id number] Progress Note  Cherri Yera [id number] 47 y.o.  11/28/2012 4:32 PM  Chief Complaint:  I am under a lot of stress.  My husband filed for separation and child custody           History of Present Illness: Patient is 47 year old Caucasian married female who came in for her appointment.  She admitted increased stress and depression because last week her husband filed for separation and child custody.  Patient admitted increased anxiety and nervousness.  She had contacted her lower and she is very worried about her son.  She has not seen her son since last week.  Patient admitted having argument with the husband because she was not allowed to see the son.  She admitted getting frustration irritability and sometime having poor sleep.  She believe that her husband is watching her and keeping a close eye on her activity.  She also feels sometime paranoia that her husband may hired some people to watch her activity.  She is willing to try higher doses of Abilify.  We have given Abilify 7.5 mg in the past but patient reported feeling weird but now agree to try again.  She seeing Victorino Dike for counseling.  Patient denies any active or passive suicidal thoughts or homicidal thoughts.  She denies any side effects of medication.  On her last visit patient was hoping that her husband will come with her to see this Clinical research associate however patient does not want him to come since he filed for separation and child custody.  Patient becoming upset when she find out that her husband accusing for mental illness including schizophrenia, bipolar disorder and major depressive disorder.  Patient believe her previous therapist [name] diagnosed her schizophrenia .  She is trying to get records from previous therapist.  The patient denies any homicidal thoughts but admitted arguments with the husband related to visitation for her son.  Patient is not drinking or using and a little substance.  She does  not report any side effects including tremors with Abilify.  She took Vistaril 1-2 times for sleep however there has been no change in her sleep.  She continues to get at least 7 hours of sleep.  She continues to stay at a hotel and sometime after family members.   Suicidal Ideation: No Plan Formed: No Patient has means to carry out plan: No  Homicidal Ideation: No Plan Formed: No Patient has means to carry out plan: No  Review of Systems  Constitutional: Negative.   Eyes: Negative.   Respiratory: Negative.   Cardiovascular: Negative.   Musculoskeletal: Negative.   Neurological: Negative.   Psychiatric/Behavioral: Negative for suicidal ideas. The patient is nervous/anxious.    Psychiatric: Agitation: No Hallucination: No Depressed Mood: Yes Insomnia: Yes Hypersomnia: No Altered Concentration: No Feels Worthless: No Grandiose Ideas: No Belief In Special Powers: No New/Increased Substance Abuse: No Compulsions: No  Neurologic: Headache: No Seizure: No Paresthesias: No  Past Medical History  Diagnosis Date  . Wears glasses   . Depression   . Cancer     bilateral breast cancer  . Heart murmur     told off and on in past that she has had one  . Grave's disease     diagnosed about 7 yrs ago but after a year she has been doing well  . Anemia     when she was younger  . Complication of anesthesia     ?, heart rate dropped w/wisdom teeth  She see Dr Johnn Hai at [facility].   Past psychiatric history. Patient has been seeing in this office since 2009.  She was admitted at [facility] due to suicidal attempt when she took overdose on her medication.  At that time she was taking Prozac which was stopped.    Social History: Patient lives with her husband.  Outpatient Encounter Prescriptions as of 11/28/2012  Medication Sig Dispense Refill  . ARIPiprazole (ABILIFY) 5 MG tablet Take 1 and 1/2 tab daily  45 tablet  0  . buPROPion (WELLBUTRIN XL)  150 MG 24 hr tablet Take 1 tablet (150 mg total) by mouth every morning.  30 tablet  0  . hydrOXYzine (VISTARIL) 25 MG capsule Take 1 capsule (25 mg total) by mouth as needed for anxiety. Take 1 capsule as needed for anxiety and insomnia  30 capsule  0  . [DISCONTINUED] ARIPiprazole (ABILIFY) 5 MG tablet Take 1 tablet (5 mg total) by mouth daily. Patient wants 5 mg (not 7.5 mg)  30 tablet  0   No facility-administered encounter medications on file as of 11/28/2012.   Recent Results (from the past 2160 hour(s))  CBC     Status: None   Collection Time    10/09/12  2:39 PM      Result Value Range   WBC 6.4  4.0 - 10.5 K/uL   RBC 4.25  3.87 - 5.11 MIL/uL   Hemoglobin 14.4  12.0 - 15.0 g/dL   HCT 19.1  47.8 - 29.5 %   MCV 95.3  78.0 - 100.0 fL   MCH 33.9  26.0 - 34.0 pg   MCHC 35.6  30.0 - 36.0 g/dL   RDW 62.1  30.8 - 65.7 %   Platelets 255  150 - 400 K/uL  COMPREHENSIVE METABOLIC PANEL     Status: Abnormal   Collection Time    10/09/12  2:39 PM      Result Value Range   Sodium 137  135 - 145 mEq/L   Potassium 3.2 (*) 3.5 - 5.1 mEq/L   Chloride 100  96 - 112 mEq/L   CO2 16 (*) 19 - 32 mEq/L   Glucose, Bld 91  70 - 99 mg/dL   BUN 11  6 - 23 mg/dL   Creatinine, Ser 8.46  0.50 - 1.10 mg/dL   Calcium 9.2  8.4 - 96.2 mg/dL   Total Protein 7.2  6.0 - 8.3 g/dL   Albumin 3.9  3.5 - 5.2 g/dL   AST 24  0 - 37 U/L   ALT 15  0 - 35 U/L   Alkaline Phosphatase 48  39 - 117 U/L   Total Bilirubin 0.3  0.3 - 1.2 mg/dL   GFR calc non Af Amer >90  >90 mL/min   GFR calc Af Amer >90  >90 mL/min   Comment: (NOTE)     The eGFR has been calculated using the CKD EPI equation.     This calculation has not been validated in all clinical situations.     eGFR's persistently <90 mL/min signify possible Chronic Kidney     Disease.  ACETAMINOPHEN LEVEL     Status: Abnormal   Collection Time    10/09/12  2:39 PM      Result Value Range   Acetaminophen (Tylenol), Serum 161.0 (*) 10 - 30 ug/mL   Comment:  MODERATE HEMOLYSIS     HEMOLYSIS AT THIS LEVEL MAY AFFECT RESULT  THERAPEUTIC CONCENTRATIONS VARY     SIGNIFICANTLY. A RANGE OF 10-30     ug/mL MAY BE AN EFFECTIVE     CONCENTRATION FOR MANY PATIENTS.     HOWEVER, SOME ARE BEST TREATED     AT CONCENTRATIONS OUTSIDE THIS     RANGE.     ACETAMINOPHEN CONCENTRATIONS     >150 ug/mL AT 4 HOURS AFTER     INGESTION AND >50 ug/mL AT 12     HOURS AFTER INGESTION ARE     OFTEN ASSOCIATED WITH TOXIC     REACTIONS.     CRITICAL RESULT CALLED TO, READ BACK BY AND VERIFIED WITH:     JEFFRIES,T.RN @1555  10/09/2012 BY WELLS,D.  SALICYLATE LEVEL     Status: Abnormal   Collection Time    10/09/12  2:39 PM      Result Value Range   Salicylate Lvl <2.0 (*) 2.8 - 20.0 mg/dL  ETHANOL     Status: Abnormal   Collection Time    10/09/12  2:39 PM      Result Value Range   Alcohol, Ethyl (B) 55 (*) 0 - 11 mg/dL   Comment:            LOWEST DETECTABLE LIMIT FOR     SERUM ALCOHOL IS 11 mg/dL     FOR MEDICAL PURPOSES ONLY  URINE RAPID DRUG SCREEN (HOSP PERFORMED)     Status: None   Collection Time    10/09/12  3:36 PM      Result Value Range   Opiates NONE DETECTED  NONE DETECTED   Cocaine NONE DETECTED  NONE DETECTED   Benzodiazepines NONE DETECTED  NONE DETECTED   Amphetamines NONE DETECTED  NONE DETECTED   Tetrahydrocannabinol NONE DETECTED  NONE DETECTED   Barbiturates NONE DETECTED  NONE DETECTED   Comment:            DRUG SCREEN FOR MEDICAL PURPOSES     ONLY.  IF CONFIRMATION IS NEEDED     FOR ANY PURPOSE, NOTIFY LAB     WITHIN 5 DAYS.                LOWEST DETECTABLE LIMITS     FOR URINE DRUG SCREEN     Drug Class       Cutoff (ng/mL)     Amphetamine      1000     Barbiturate      200     Benzodiazepine   200     Tricyclics       300     Opiates          300     Cocaine          300     THC              50  URINALYSIS, ROUTINE W REFLEX MICROSCOPIC     Status: Abnormal   Collection Time    10/09/12  3:36 PM       Result Value Range   Color, Urine YELLOW  YELLOW   APPearance CLEAR  CLEAR   Specific Gravity, Urine >1.046 (*) 1.005 - 1.030   pH 5.0  5.0 - 8.0   Glucose, UA NEGATIVE  NEGATIVE mg/dL   Hgb urine dipstick NEGATIVE  NEGATIVE   Bilirubin Urine SMALL (*) NEGATIVE   Ketones, ur 40 (*) NEGATIVE mg/dL   Protein, ur NEGATIVE  NEGATIVE mg/dL   Urobilinogen, UA 0.2  0.0 - 1.0 mg/dL  Nitrite NEGATIVE  NEGATIVE   Leukocytes, UA NEGATIVE  NEGATIVE   Comment: MICROSCOPIC NOT DONE ON URINES WITH NEGATIVE PROTEIN, BLOOD, LEUKOCYTES, NITRITE, OR GLUCOSE <1000 mg/dL.  PROTIME-INR     Status: None   Collection Time    10/09/12  3:39 PM      Result Value Range   Prothrombin Time 12.9  11.6 - 15.2 seconds   INR 0.99  0.00 - 1.49  MRSA PCR SCREENING     Status: None   Collection Time    10/09/12  6:27 PM      Result Value Range   MRSA by PCR NEGATIVE  NEGATIVE   Comment:            The GeneXpert MRSA Assay (FDA     approved for NASAL specimens     only), is one component of a     comprehensive MRSA colonization     surveillance program. It is not     intended to diagnose MRSA     infection nor to guide or     monitor treatment for     MRSA infections.     Performed at [facility]  LACTIC ACID, PLASMA     Status: Abnormal   Collection Time    10/09/12  6:55 PM      Result Value Range   Lactic Acid, Venous 2.5 (*) 0.5 - 2.2 mmol/L  ACETAMINOPHEN LEVEL     Status: Abnormal   Collection Time    10/09/12  6:55 PM      Result Value Range   Acetaminophen (Tylenol), Serum 136.3 (*) 10 - 30 ug/mL   Comment:            THERAPEUTIC CONCENTRATIONS VARY     SIGNIFICANTLY. A RANGE OF 10-30     ug/mL MAY BE AN EFFECTIVE     CONCENTRATION FOR MANY PATIENTS.     HOWEVER, SOME ARE BEST TREATED     AT CONCENTRATIONS OUTSIDE THIS     RANGE.     ACETAMINOPHEN CONCENTRATIONS     >150 ug/mL AT 4 HOURS AFTER     INGESTION AND >50 ug/mL AT 12     HOURS AFTER INGESTION ARE     OFTEN ASSOCIATED  WITH TOXIC     REACTIONS.  HEPATIC FUNCTION PANEL     Status: Abnormal   Collection Time    10/09/12  6:55 PM      Result Value Range   Total Protein 6.5  6.0 - 8.3 g/dL   Albumin 3.1 (*) 3.5 - 5.2 g/dL   AST 17  0 - 37 U/L   ALT 14  0 - 35 U/L   Alkaline Phosphatase 40  39 - 117 U/L   Total Bilirubin 0.2 (*) 0.3 - 1.2 mg/dL   Bilirubin, Direct <1.6  0.0 - 0.3 mg/dL   Indirect Bilirubin NOT CALCULATED  0.3 - 0.9 mg/dL  COMPREHENSIVE METABOLIC PANEL     Status: Abnormal   Collection Time    10/10/12  3:40 AM      Result Value Range   Sodium 135  135 - 145 mEq/L   Potassium 3.2 (*) 3.5 - 5.1 mEq/L   Chloride 104  96 - 112 mEq/L   CO2 17 (*) 19 - 32 mEq/L   Glucose, Bld 94  70 - 99 mg/dL   BUN 7  6 - 23 mg/dL   Creatinine, Ser 1.09  0.50 - 1.10 mg/dL   Calcium 8.0 (*)  8.4 - 10.5 mg/dL   Total Protein 5.9 (*) 6.0 - 8.3 g/dL   Albumin 2.9 (*) 3.5 - 5.2 g/dL   AST 22  0 - 37 U/L   ALT 16  0 - 35 U/L   Alkaline Phosphatase 37 (*) 39 - 117 U/L   Total Bilirubin 0.4  0.3 - 1.2 mg/dL   GFR calc non Af Amer >90  >90 mL/min   GFR calc Af Amer >90  >90 mL/min   Comment: (NOTE)     The eGFR has been calculated using the CKD EPI equation.     This calculation has not been validated in all clinical situations.     eGFR's persistently <90 mL/min signify possible Chronic Kidney     Disease.  PROTIME-INR     Status: Abnormal   Collection Time    10/10/12  3:40 AM      Result Value Range   Prothrombin Time 15.6 (*) 11.6 - 15.2 seconds   INR 1.27  0.00 - 1.49  ACETAMINOPHEN LEVEL     Status: None   Collection Time    10/10/12  3:40 AM      Result Value Range   Acetaminophen (Tylenol), Serum <15.0  10 - 30 ug/mL   Comment:            THERAPEUTIC CONCENTRATIONS VARY     SIGNIFICANTLY. A RANGE OF 10-30     ug/mL MAY BE AN EFFECTIVE     CONCENTRATION FOR MANY PATIENTS.     HOWEVER, SOME ARE BEST TREATED     AT CONCENTRATIONS OUTSIDE THIS     RANGE.     ACETAMINOPHEN CONCENTRATIONS      >150 ug/mL AT 4 HOURS AFTER     INGESTION AND >50 ug/mL AT 12     HOURS AFTER INGESTION ARE     OFTEN ASSOCIATED WITH TOXIC     REACTIONS.  CBC     Status: None   Collection Time    10/10/12  3:40 AM      Result Value Range   WBC 8.7  4.0 - 10.5 K/uL   RBC 4.11  3.87 - 5.11 MIL/uL   Hemoglobin 13.5  12.0 - 15.0 g/dL   HCT 69.6  29.5 - 28.4 %   MCV 95.9  78.0 - 100.0 fL   MCH 32.8  26.0 - 34.0 pg   MCHC 34.3  30.0 - 36.0 g/dL   RDW 13.2  44.0 - 10.2 %   Platelets 258  150 - 400 K/uL  PROTIME-INR     Status: None   Collection Time    10/10/12  3:46 PM      Result Value Range   Prothrombin Time 14.7  11.6 - 15.2 seconds   INR 1.17  0.00 - 1.49  COMPREHENSIVE METABOLIC PANEL     Status: Abnormal   Collection Time    10/10/12  3:46 PM      Result Value Range   Sodium 136  135 - 145 mEq/L   Potassium 3.9  3.5 - 5.1 mEq/L   Comment: REPEATED TO VERIFY     DELTA CHECK NOTED   Chloride 106  96 - 112 mEq/L   CO2 18 (*) 19 - 32 mEq/L   Glucose, Bld 87  70 - 99 mg/dL   BUN 5 (*) 6 - 23 mg/dL   Creatinine, Ser 7.25  0.50 - 1.10 mg/dL   Calcium 8.6  8.4 - 36.6 mg/dL   Total  Protein 6.1  6.0 - 8.3 g/dL   Albumin 3.0 (*) 3.5 - 5.2 g/dL   AST 15  0 - 37 U/L   ALT 13  0 - 35 U/L   Alkaline Phosphatase 39  39 - 117 U/L   Total Bilirubin 0.3  0.3 - 1.2 mg/dL   GFR calc non Af Amer >90  >90 mL/min   GFR calc Af Amer >90  >90 mL/min   Comment: (NOTE)     The eGFR has been calculated using the CKD EPI equation.     This calculation has not been validated in all clinical situations.     eGFR's persistently <90 mL/min signify possible Chronic Kidney     Disease.  ACETAMINOPHEN LEVEL     Status: None   Collection Time    10/10/12  3:46 PM      Result Value Range   Acetaminophen (Tylenol), Serum <15.0  10 - 30 ug/mL   Comment:            THERAPEUTIC CONCENTRATIONS VARY     SIGNIFICANTLY. A RANGE OF 10-30     ug/mL MAY BE AN EFFECTIVE     CONCENTRATION FOR MANY PATIENTS.      HOWEVER, SOME ARE BEST TREATED     AT CONCENTRATIONS OUTSIDE THIS     RANGE.     ACETAMINOPHEN CONCENTRATIONS     >150 ug/mL AT 4 HOURS AFTER     INGESTION AND >50 ug/mL AT 12     HOURS AFTER INGESTION ARE     OFTEN ASSOCIATED WITH TOXIC     REACTIONS.  COMPREHENSIVE METABOLIC PANEL     Status: Abnormal   Collection Time    10/11/12  4:20 AM      Result Value Range   Sodium 135  135 - 145 mEq/L   Potassium 3.5  3.5 - 5.1 mEq/L   Chloride 106  96 - 112 mEq/L   CO2 18 (*) 19 - 32 mEq/L   Glucose, Bld 82  70 - 99 mg/dL   BUN 5 (*) 6 - 23 mg/dL   Creatinine, Ser 9.60  0.50 - 1.10 mg/dL   Calcium 8.7  8.4 - 45.4 mg/dL   Total Protein 6.0  6.0 - 8.3 g/dL   Albumin 3.0 (*) 3.5 - 5.2 g/dL   AST 13  0 - 37 U/L   ALT 12  0 - 35 U/L   Alkaline Phosphatase 39  39 - 117 U/L   Total Bilirubin 0.1 (*) 0.3 - 1.2 mg/dL   GFR calc non Af Amer >90  >90 mL/min   GFR calc Af Amer >90  >90 mL/min   Comment: (NOTE)     The eGFR has been calculated using the CKD EPI equation.     This calculation has not been validated in all clinical situations.     eGFR's persistently <90 mL/min signify possible Chronic Kidney     Disease.  PROTIME-INR     Status: None   Collection Time    10/11/12  4:20 AM      Result Value Range   Prothrombin Time 13.5  11.6 - 15.2 seconds   INR 1.05  0.00 - 1.49    Past Psychiatric History/Hospitalization(s): Anxiety: Yes Bipolar Disorder: No Depression: Yes Mania: No Psychosis: Yes Schizophrenia: No Personality Disorder: No Hospitalization for psychiatric illness: Yes History of Electroconvulsive Shock Therapy: No Prior Suicide Attempts: Yes  Physical Exam: Constitutional:  BP 124/88  Pulse 90  Ht  5\' 5"  (1.651 m)  Wt 177 lb (80.287 kg)  BMI 29.45 kg/m2  LMP 11/06/2012  General Appearance: well nourished but tired.    Musculoskeletal: Strength & Muscle Tone: within normal limits Gait & Station: normal Patient leans: N/A  Psychiatric: Speech  (describe rate, volume, coherence, spontaneity, and abnormalities if any): Clear and coherent.  Thought Process (describe rate, content, abstract reasoning, and computation): Logical and goal-directed.  Associations: Coherent, Relevant and Intact  Thoughts: normal  Mental Status: Orientation: oriented to person, place, time/date and situation Mood & Affect: depressed affect and anxiety Attention Span & Concentration: Fair  Medical Decision Making (Choose Three): Established Problem, Stable/Improving (1), Review of Psycho-Social Stressors (1), Review of Last Therapy Session (1), Review of Medication Regimen & Side Effects (2) and Review of New Medication or Change in Dosage (2)  Assessment: Axis I: Maj. depressive disorder  Axis II: Deferred  Axis III: See medical history  Axis IV: Mild  Axis V: 65-70   Plan:  Patient is willing to try again Abilify 7.5 mg daily .  I recommend to continue Wellbutrin XL 150 mg daily.  She does not require Vistaril since she has not use every day.  I strongly recommended to call us back if she stopped feeling any side effects with increased Abilify.  Recommend to see therapist for coping and social skills.  Encouraged her to get legal help .  Patient has appointment with her lawyer in few days.  Followup in 3-4 weeks.  Time spent 25 minutes. Discuss safety plan that anytime having active suicidal thoughts or homicidal thoughts then patient need to call 911 or go to the local emergency room.  ARFEEN,SYED T., MD 11/28/2012

## 2012-12-04 ENCOUNTER — Ambulatory Visit (HOSPITAL_COMMUNITY): Payer: Self-pay | Admitting: Psychiatry

## 2012-12-06 ENCOUNTER — Other Ambulatory Visit (HOSPITAL_COMMUNITY): Payer: Self-pay | Admitting: Psychiatry

## 2012-12-06 DIAGNOSIS — F331 Major depressive disorder, recurrent, moderate: Secondary | ICD-10-CM

## 2012-12-07 ENCOUNTER — Encounter (HOSPITAL_COMMUNITY): Payer: Self-pay | Admitting: Psychiatry

## 2012-12-07 ENCOUNTER — Ambulatory Visit (INDEPENDENT_AMBULATORY_CARE_PROVIDER_SITE_OTHER): Payer: Commercial Managed Care - PPO | Admitting: Psychiatry

## 2012-12-07 ENCOUNTER — Ambulatory Visit (HOSPITAL_COMMUNITY): Payer: Self-pay | Admitting: Psychiatry

## 2012-12-07 DIAGNOSIS — F329 Major depressive disorder, single episode, unspecified: Secondary | ICD-10-CM

## 2012-12-07 NOTE — Progress Notes (Signed)
Patient ID: Donna Black, female   DOB: 1965-09-20, 47 y.o.   MRN: [id number]  Session Time: 10:00-10:50  Participation Level: Active   Behavioral Response: CasualAlertEuthymic   Type of Therapy: Individual Therapy   Treatment Goals addressed: emotion regulation, stress management, decision making   Interventions: CBT   Summary: [name] is a 47 y.o. female who presents with major depressive disorder.   Suicidal/Homicidal: Nowithout intent/plan   Therapist Response: Pt. Reports that she has temporary custody order granting her 3 days a week with her son. Pt. Reports conflicted feelings regarding desire to resolve marital conflict. Session focused on developing Pt.'s acceptance of feelings and developing trust in her marriage so that she can share her feelings without fear. Recommended marriage therapy so that she can explore with her husband the potential for reconciliation.  Plan: Return again in 2 weeks.   Diagnosis: Axis I: Major depressive disorder   Axis II: No diagnosis  [name]  12/07/2012

## 2012-12-13 ENCOUNTER — Encounter (HOSPITAL_COMMUNITY): Payer: Self-pay | Admitting: Psychiatry

## 2012-12-13 ENCOUNTER — Ambulatory Visit (INDEPENDENT_AMBULATORY_CARE_PROVIDER_SITE_OTHER): Payer: Commercial Managed Care - PPO | Admitting: Psychiatry

## 2012-12-13 VITALS — BP 124/76 | HR 100 | Ht 65.0 in | Wt 174.4 lb

## 2012-12-13 DIAGNOSIS — F329 Major depressive disorder, single episode, unspecified: Secondary | ICD-10-CM

## 2012-12-13 DIAGNOSIS — F331 Major depressive disorder, recurrent, moderate: Secondary | ICD-10-CM

## 2012-12-13 MED ORDER — ARIPIPRAZOLE 5 MG PO TABS: ORAL_TABLET | ORAL | Status: DC

## 2012-12-13 NOTE — Progress Notes (Signed)
[facility] [id number] Progress Note  Donna Black [id number] 47 y.o.  12/13/2012 11:33 AM  Chief Complaint:  Followup            History of Present Illness: Patient came today for her followup appointment with her husband.  Patient appears much calmer and relaxed.  She reported she brought her husband because he is trying to save the marriage however he needs all the information about her psychiatric treatment.  She is still not comfortable giving him permission or signing out or guarding but she agree that he can call us and give [name] the information.  Patient is not sure if the information was used against her.  Husband endorse much improvement in patient's behavior since Abilify is increased.  Patient has taken Vistaril 3 for times when she was very anxious and could not sleep.  She admitted one glass of wine on Saturday but denies any binge drinking.  She is going to [place] to attend the funeral service of her family friend.  She is seeing her son more frequently however she is still staying out of home.  She staying at her father's home, friend's house and sometime at the hotel.  Husband is willing to work with her if the patient signed the consent to release her healthcare information.  Patient has not decided yet however she will discuss the first with her lawyer.  Patient is sleeping better.  She denies any delusions or any paranoia that husband is spying on her.  She gets emotional when she see her son otherwise she reported her mood has been much improved.  She denies any suicidal thoughts or homicidal thoughts.  She denies any tremors or shakes.  She is compliant with Wellbutrin, Abilify as prescribed.  Court date is on November 3 regarding child custody however it may change since both parties working to resolve the issue.  Suicidal Ideation: No Plan Formed: No Patient has means to carry out plan: No  Homicidal Ideation: No Plan Formed: No Patient has means to carry out  plan: No  Review of Systems  Constitutional: Negative.   Eyes: Negative.   Respiratory: Negative.   Cardiovascular: Negative.   Musculoskeletal: Negative.   Neurological: Negative.   Psychiatric/Behavioral: Negative for suicidal ideas. The patient is nervous/anxious.    Psychiatric: Agitation: No Hallucination: No Depressed Mood: Yes Insomnia: Yes Hypersomnia: No Altered Concentration: No Feels Worthless: No Grandiose Ideas: No Belief In Special Powers: No New/Increased Substance Abuse: No Compulsions: No  Neurologic: Headache: No Seizure: No Paresthesias: No  Past Medical History  Diagnosis Date  . Wears glasses   . Depression   . Cancer     bilateral breast cancer  . Heart murmur     told off and on in past that she has had one  . Grave's disease     diagnosed about 7 yrs ago but after a year she has been doing well  . Anemia     when she was younger  . Complication of anesthesia     ?, heart rate dropped w/wisdom teeth   She see Dr Johnn Hai at [facility].   Past psychiatric history. Patient has been seeing in this office since 2009.  She was admitted at [facility] due to suicidal attempt when she took overdose on her medication.  At that time she was taking Prozac which was stopped.    Social History: Patient lives with her husband.  Outpatient Encounter Prescriptions as of 12/13/2012  Medication Sig Dispense Refill  . ARIPiprazole (ABILIFY) 5 MG tablet Take 1 and 1/2 tab daily  45 tablet  0  . buPROPion (WELLBUTRIN XL) 150 MG 24 hr tablet TAKE 1 TABLET BY MOUTH EVERY MORNING  30 tablet  0  . hydrOXYzine (VISTARIL) 25 MG capsule Take 1 capsule (25 mg total) by mouth as needed for anxiety. Take 1 capsule as needed for anxiety and insomnia  30 capsule  0  . [DISCONTINUED] ARIPiprazole (ABILIFY) 5 MG tablet Take 1 and 1/2 tab daily  45 tablet  0   No facility-administered encounter medications on file as of 12/13/2012.   Recent  Results (from the past 2160 hour(s))  CBC     Status: None   Collection Time    10/09/12  2:39 PM      Result Value Range   WBC 6.4  4.0 - 10.5 K/uL   RBC 4.25  3.87 - 5.11 MIL/uL   Hemoglobin 14.4  12.0 - 15.0 g/dL   HCT 40.9  81.1 - 91.4 %   MCV 95.3  78.0 - 100.0 fL   MCH 33.9  26.0 - 34.0 pg   MCHC 35.6  30.0 - 36.0 g/dL   RDW 78.2  95.6 - 21.3 %   Platelets 255  150 - 400 K/uL  COMPREHENSIVE METABOLIC PANEL     Status: Abnormal   Collection Time    10/09/12  2:39 PM      Result Value Range   Sodium 137  135 - 145 mEq/L   Potassium 3.2 (*) 3.5 - 5.1 mEq/L   Chloride 100  96 - 112 mEq/L   CO2 16 (*) 19 - 32 mEq/L   Glucose, Bld 91  70 - 99 mg/dL   BUN 11  6 - 23 mg/dL   Creatinine, Ser 0.86  0.50 - 1.10 mg/dL   Calcium 9.2  8.4 - 57.8 mg/dL   Total Protein 7.2  6.0 - 8.3 g/dL   Albumin 3.9  3.5 - 5.2 g/dL   AST 24  0 - 37 U/L   ALT 15  0 - 35 U/L   Alkaline Phosphatase 48  39 - 117 U/L   Total Bilirubin 0.3  0.3 - 1.2 mg/dL   GFR calc non Af Amer >90  >90 mL/min   GFR calc Af Amer >90  >90 mL/min   Comment: (NOTE)     The eGFR has been calculated using the CKD EPI equation.     This calculation has not been validated in all clinical situations.     eGFR's persistently <90 mL/min signify possible Chronic Kidney     Disease.  ACETAMINOPHEN LEVEL     Status: Abnormal   Collection Time    10/09/12  2:39 PM      Result Value Range   Acetaminophen (Tylenol), Serum 161.0 (*) 10 - 30 ug/mL   Comment: MODERATE HEMOLYSIS     HEMOLYSIS AT THIS LEVEL MAY AFFECT RESULT                THERAPEUTIC CONCENTRATIONS VARY     SIGNIFICANTLY. A RANGE OF 10-30     ug/mL MAY BE AN EFFECTIVE     CONCENTRATION FOR MANY PATIENTS.     HOWEVER, SOME ARE BEST TREATED     AT CONCENTRATIONS OUTSIDE THIS     RANGE.     ACETAMINOPHEN CONCENTRATIONS     >150 ug/mL AT 4 HOURS AFTER     INGESTION AND >50 ug/mL  AT 12     HOURS AFTER INGESTION ARE     OFTEN ASSOCIATED WITH TOXIC     REACTIONS.      CRITICAL RESULT CALLED TO, READ BACK BY AND VERIFIED WITH:     JEFFRIES,T.RN @1555  10/09/2012 BY WELLS,D.  SALICYLATE LEVEL     Status: Abnormal   Collection Time    10/09/12  2:39 PM      Result Value Range   Salicylate Lvl <2.0 (*) 2.8 - 20.0 mg/dL  ETHANOL     Status: Abnormal   Collection Time    10/09/12  2:39 PM      Result Value Range   Alcohol, Ethyl (B) 55 (*) 0 - 11 mg/dL   Comment:            LOWEST DETECTABLE LIMIT FOR     SERUM ALCOHOL IS 11 mg/dL     FOR MEDICAL PURPOSES ONLY  URINE RAPID DRUG SCREEN (HOSP PERFORMED)     Status: None   Collection Time    10/09/12  3:36 PM      Result Value Range   Opiates NONE DETECTED  NONE DETECTED   Cocaine NONE DETECTED  NONE DETECTED   Benzodiazepines NONE DETECTED  NONE DETECTED   Amphetamines NONE DETECTED  NONE DETECTED   Tetrahydrocannabinol NONE DETECTED  NONE DETECTED   Barbiturates NONE DETECTED  NONE DETECTED   Comment:            DRUG SCREEN FOR MEDICAL PURPOSES     ONLY.  IF CONFIRMATION IS NEEDED     FOR ANY PURPOSE, NOTIFY LAB     WITHIN 5 DAYS.                LOWEST DETECTABLE LIMITS     FOR URINE DRUG SCREEN     Drug Class       Cutoff (ng/mL)     Amphetamine      1000     Barbiturate      200     Benzodiazepine   200     Tricyclics       300     Opiates          300     Cocaine          300     THC              50  URINALYSIS, ROUTINE W REFLEX MICROSCOPIC     Status: Abnormal   Collection Time    10/09/12  3:36 PM      Result Value Range   Color, Urine YELLOW  YELLOW   APPearance CLEAR  CLEAR   Specific Gravity, Urine >1.046 (*) 1.005 - 1.030   pH 5.0  5.0 - 8.0   Glucose, UA NEGATIVE  NEGATIVE mg/dL   Hgb urine dipstick NEGATIVE  NEGATIVE   Bilirubin Urine SMALL (*) NEGATIVE   Ketones, ur 40 (*) NEGATIVE mg/dL   Protein, ur NEGATIVE  NEGATIVE mg/dL   Urobilinogen, UA 0.2  0.0 - 1.0 mg/dL   Nitrite NEGATIVE  NEGATIVE   Leukocytes, UA NEGATIVE  NEGATIVE   Comment: MICROSCOPIC NOT DONE ON  URINES WITH NEGATIVE PROTEIN, BLOOD, LEUKOCYTES, NITRITE, OR GLUCOSE <1000 mg/dL.  PROTIME-INR     Status: None   Collection Time    10/09/12  3:39 PM      Result Value Range   Prothrombin Time 12.9  11.6 - 15.2 seconds   INR 0.99  0.00 -  1.49  MRSA PCR SCREENING     Status: None   Collection Time    10/09/12  6:27 PM      Result Value Range   MRSA by PCR NEGATIVE  NEGATIVE   Comment:            The GeneXpert MRSA Assay (FDA     approved for NASAL specimens     only), is one component of a     comprehensive MRSA colonization     surveillance program. It is not     intended to diagnose MRSA     infection nor to guide or     monitor treatment for     MRSA infections.     Performed at [facility]  LACTIC ACID, PLASMA     Status: Abnormal   Collection Time    10/09/12  6:55 PM      Result Value Range   Lactic Acid, Venous 2.5 (*) 0.5 - 2.2 mmol/L  ACETAMINOPHEN LEVEL     Status: Abnormal   Collection Time    10/09/12  6:55 PM      Result Value Range   Acetaminophen (Tylenol), Serum 136.3 (*) 10 - 30 ug/mL   Comment:            THERAPEUTIC CONCENTRATIONS VARY     SIGNIFICANTLY. A RANGE OF 10-30     ug/mL MAY BE AN EFFECTIVE     CONCENTRATION FOR MANY PATIENTS.     HOWEVER, SOME ARE BEST TREATED     AT CONCENTRATIONS OUTSIDE THIS     RANGE.     ACETAMINOPHEN CONCENTRATIONS     >150 ug/mL AT 4 HOURS AFTER     INGESTION AND >50 ug/mL AT 12     HOURS AFTER INGESTION ARE     OFTEN ASSOCIATED WITH TOXIC     REACTIONS.  HEPATIC FUNCTION PANEL     Status: Abnormal   Collection Time    10/09/12  6:55 PM      Result Value Range   Total Protein 6.5  6.0 - 8.3 g/dL   Albumin 3.1 (*) 3.5 - 5.2 g/dL   AST 17  0 - 37 U/L   ALT 14  0 - 35 U/L   Alkaline Phosphatase 40  39 - 117 U/L   Total Bilirubin 0.2 (*) 0.3 - 1.2 mg/dL   Bilirubin, Direct <1.6  0.0 - 0.3 mg/dL   Indirect Bilirubin NOT CALCULATED  0.3 - 0.9 mg/dL  COMPREHENSIVE METABOLIC PANEL     Status: Abnormal    Collection Time    10/10/12  3:40 AM      Result Value Range   Sodium 135  135 - 145 mEq/L   Potassium 3.2 (*) 3.5 - 5.1 mEq/L   Chloride 104  96 - 112 mEq/L   CO2 17 (*) 19 - 32 mEq/L   Glucose, Bld 94  70 - 99 mg/dL   BUN 7  6 - 23 mg/dL   Creatinine, Ser 1.09  0.50 - 1.10 mg/dL   Calcium 8.0 (*) 8.4 - 10.5 mg/dL   Total Protein 5.9 (*) 6.0 - 8.3 g/dL   Albumin 2.9 (*) 3.5 - 5.2 g/dL   AST 22  0 - 37 U/L   ALT 16  0 - 35 U/L   Alkaline Phosphatase 37 (*) 39 - 117 U/L   Total Bilirubin 0.4  0.3 - 1.2 mg/dL   GFR calc non Af Amer >90  >90 mL/min  GFR calc Af Amer >90  >90 mL/min   Comment: (NOTE)     The eGFR has been calculated using the CKD EPI equation.     This calculation has not been validated in all clinical situations.     eGFR's persistently <90 mL/min signify possible Chronic Kidney     Disease.  PROTIME-INR     Status: Abnormal   Collection Time    10/10/12  3:40 AM      Result Value Range   Prothrombin Time 15.6 (*) 11.6 - 15.2 seconds   INR 1.27  0.00 - 1.49  ACETAMINOPHEN LEVEL     Status: None   Collection Time    10/10/12  3:40 AM      Result Value Range   Acetaminophen (Tylenol), Serum <15.0  10 - 30 ug/mL   Comment:            THERAPEUTIC CONCENTRATIONS VARY     SIGNIFICANTLY. A RANGE OF 10-30     ug/mL MAY BE AN EFFECTIVE     CONCENTRATION FOR MANY PATIENTS.     HOWEVER, SOME ARE BEST TREATED     AT CONCENTRATIONS OUTSIDE THIS     RANGE.     ACETAMINOPHEN CONCENTRATIONS     >150 ug/mL AT 4 HOURS AFTER     INGESTION AND >50 ug/mL AT 12     HOURS AFTER INGESTION ARE     OFTEN ASSOCIATED WITH TOXIC     REACTIONS.  CBC     Status: None   Collection Time    10/10/12  3:40 AM      Result Value Range   WBC 8.7  4.0 - 10.5 K/uL   RBC 4.11  3.87 - 5.11 MIL/uL   Hemoglobin 13.5  12.0 - 15.0 g/dL   HCT 40.9  81.1 - 91.4 %   MCV 95.9  78.0 - 100.0 fL   MCH 32.8  26.0 - 34.0 pg   MCHC 34.3  30.0 - 36.0 g/dL   RDW 78.2  95.6 - 21.3 %   Platelets 258   150 - 400 K/uL  PROTIME-INR     Status: None   Collection Time    10/10/12  3:46 PM      Result Value Range   Prothrombin Time 14.7  11.6 - 15.2 seconds   INR 1.17  0.00 - 1.49  COMPREHENSIVE METABOLIC PANEL     Status: Abnormal   Collection Time    10/10/12  3:46 PM      Result Value Range   Sodium 136  135 - 145 mEq/L   Potassium 3.9  3.5 - 5.1 mEq/L   Comment: REPEATED TO VERIFY     DELTA CHECK NOTED   Chloride 106  96 - 112 mEq/L   CO2 18 (*) 19 - 32 mEq/L   Glucose, Bld 87  70 - 99 mg/dL   BUN 5 (*) 6 - 23 mg/dL   Creatinine, Ser 0.86  0.50 - 1.10 mg/dL   Calcium 8.6  8.4 - 57.8 mg/dL   Total Protein 6.1  6.0 - 8.3 g/dL   Albumin 3.0 (*) 3.5 - 5.2 g/dL   AST 15  0 - 37 U/L   ALT 13  0 - 35 U/L   Alkaline Phosphatase 39  39 - 117 U/L   Total Bilirubin 0.3  0.3 - 1.2 mg/dL   GFR calc non Af Amer >90  >90 mL/min   GFR calc Af Amer >90  >90  mL/min   Comment: (NOTE)     The eGFR has been calculated using the CKD EPI equation.     This calculation has not been validated in all clinical situations.     eGFR's persistently <90 mL/min signify possible Chronic Kidney     Disease.  ACETAMINOPHEN LEVEL     Status: None   Collection Time    10/10/12  3:46 PM      Result Value Range   Acetaminophen (Tylenol), Serum <15.0  10 - 30 ug/mL   Comment:            THERAPEUTIC CONCENTRATIONS VARY     SIGNIFICANTLY. A RANGE OF 10-30     ug/mL MAY BE AN EFFECTIVE     CONCENTRATION FOR MANY PATIENTS.     HOWEVER, SOME ARE BEST TREATED     AT CONCENTRATIONS OUTSIDE THIS     RANGE.     ACETAMINOPHEN CONCENTRATIONS     >150 ug/mL AT 4 HOURS AFTER     INGESTION AND >50 ug/mL AT 12     HOURS AFTER INGESTION ARE     OFTEN ASSOCIATED WITH TOXIC     REACTIONS.  COMPREHENSIVE METABOLIC PANEL     Status: Abnormal   Collection Time    10/11/12  4:20 AM      Result Value Range   Sodium 135  135 - 145 mEq/L   Potassium 3.5  3.5 - 5.1 mEq/L   Chloride 106  96 - 112 mEq/L   CO2 18 (*) 19 -  32 mEq/L   Glucose, Bld 82  70 - 99 mg/dL   BUN 5 (*) 6 - 23 mg/dL   Creatinine, Ser 1.61  0.50 - 1.10 mg/dL   Calcium 8.7  8.4 - 09.6 mg/dL   Total Protein 6.0  6.0 - 8.3 g/dL   Albumin 3.0 (*) 3.5 - 5.2 g/dL   AST 13  0 - 37 U/L   ALT 12  0 - 35 U/L   Alkaline Phosphatase 39  39 - 117 U/L   Total Bilirubin 0.1 (*) 0.3 - 1.2 mg/dL   GFR calc non Af Amer >90  >90 mL/min   GFR calc Af Amer >90  >90 mL/min   Comment: (NOTE)     The eGFR has been calculated using the CKD EPI equation.     This calculation has not been validated in all clinical situations.     eGFR's persistently <90 mL/min signify possible Chronic Kidney     Disease.  PROTIME-INR     Status: None   Collection Time    10/11/12  4:20 AM      Result Value Range   Prothrombin Time 13.5  11.6 - 15.2 seconds   INR 1.05  0.00 - 1.49    Past Psychiatric History/Hospitalization(s): Anxiety: Yes Bipolar Disorder: No Depression: Yes Mania: No Psychosis: Yes Schizophrenia: No Personality Disorder: No Hospitalization for psychiatric illness: Yes History of Electroconvulsive Shock Therapy: No Prior Suicide Attempts: Yes  Physical Exam: Constitutional:  BP 124/76  Pulse 100  Ht 5\' 5"  (1.651 m)  Wt 174 lb 6.4 oz (79.107 kg)  BMI 29.02 kg/m2  General Appearance: well nourished but tired.    Musculoskeletal: Strength & Muscle Tone: within normal limits Gait & Station: normal Patient leans: N/A  Psychiatric: Speech (describe rate, volume, coherence, spontaneity, and abnormalities if any): Clear and coherent.  Thought Process (describe rate, content, abstract reasoning, and computation): Logical and goal-directed.  Associations: Coherent, Relevant and  Intact  Thoughts: normal  Mental Status: Orientation: oriented to person, place, time/date and situation Mood & Affect: depressed affect and anxiety Attention Span & Concentration: Fair  Medical Decision Making (Choose Three): Established Problem,  Stable/Improving (1), Review of Psycho-Social Stressors (1), Review of Last Therapy Session (1), Review of Medication Regimen & Side Effects (2) and Review of New Medication or Change in Dosage (2)  Assessment: Axis I: Maj. depressive disorder  Axis II: Deferred  Axis III: See medical history  Axis IV: Mild  Axis V: 65-70   Plan:  I will continue Abilify 7.5 mg daily and Wellbutrin XL 150 mg daily.  Recommend to use Vistaril 25 mg as needed for anxiety and insomnia.  Discussed in detail the risks and benefits of medication.  At this time patient has agreed that husband can come with her on appointment but was reluctant to give consent for health care information.  She is thinking to allow husband to call us and if she decided she will let us know.  I recommend to stop drinking due to depression with psychotropic medication.  Followup in 3-4 weeks.  Time spent 25 minutes. Discuss safety plan that anytime having active suicidal thoughts or homicidal thoughts then patient need to call 911 or go to the local emergency room.  Valma Rotenberg T., MD 12/13/2012

## 2012-12-14 ENCOUNTER — Ambulatory Visit (INDEPENDENT_AMBULATORY_CARE_PROVIDER_SITE_OTHER): Payer: Commercial Managed Care - PPO | Admitting: Psychiatry

## 2012-12-14 ENCOUNTER — Encounter (HOSPITAL_COMMUNITY): Payer: Self-pay | Admitting: Psychiatry

## 2012-12-14 DIAGNOSIS — F329 Major depressive disorder, single episode, unspecified: Secondary | ICD-10-CM

## 2012-12-14 NOTE — Progress Notes (Signed)
Patient ID: Donna Black, female   DOB: [birth year], 47 y.o.   MRN: [id number]  Session Time: 8:00-8:50  Participation Level: Active   Behavioral Response: CasualAlertEuthymic   Type of Therapy: Individual Therapy   Treatment Goals addressed: emotion regulation, stress management, decision making   Interventions: CBT   Summary: Donna Black is a 47 y.o. female who presents with major depressive disorder.   Suicidal/Homicidal: Nowithout intent/plan   Therapist Response: Pt. Joined in session with husband Jonny Ruiz). Explained to Pt. And husband that all sessions with me were for the benefit of Donna Black (i.e., discussions of how Jonny Ruiz could provide supportive relationship for Clarity); cautioned both parties about the potential use of individual sessions to support litigation. Pt. And husband indicated their mutual desire for marital reconciliation. Processed issues related to development of trust, validation of feelings and experiences. Recommended that they pursue marital counseling.  Plan: Return again in 2 weeks.   Diagnosis: Axis I: Major depressive disorder   Axis II: No diagnosis  [name]  12/14/2012

## 2012-12-18 ENCOUNTER — Ambulatory Visit (INDEPENDENT_AMBULATORY_CARE_PROVIDER_SITE_OTHER): Payer: Commercial Managed Care - PPO | Admitting: Psychiatry

## 2012-12-18 ENCOUNTER — Encounter (HOSPITAL_COMMUNITY): Payer: Self-pay | Admitting: Psychiatry

## 2012-12-18 DIAGNOSIS — F329 Major depressive disorder, single episode, unspecified: Secondary | ICD-10-CM

## 2012-12-18 NOTE — Progress Notes (Signed)
Patient ID: Donna Black, female   DOB: [birth year], 47 y.o.   MRN: [id number]  Session Time: 9:40-10:10  Participation Level: Active   Behavioral Response: CasualAlertEuthymic   Type of Therapy: Individual Therapy   Treatment Goals addressed: emotion regulation, stress management, decision making   Interventions: CBT   Summary: Donna Black is a 47 y.o. female who presents with major depressive disorder.   Suicidal/Homicidal: Nowithout intent/plan   Therapist Response: Processed joint session from last week. Discussed components of wellness plan. Therapist asked Pt. To think about her wellness routine and bring specifics to next session. Pt. Requested joint session with her husband and Dr. Lolly Mustache to discuss her wellness plan. Discussed issue of providing assurances for her husband about crisis planning and that we cannot give assurances, but can provide a plan that can be used as a reference to assess health maintenance and identify triggers for crises so that they can be better managed.  Discussed development of trust and importance of validation of feelings and experiences. Pt. Reports that she and her husband have made an appointment with a marital therapist.   Plan: Return again in 2 weeks.   Diagnosis: Axis I: Major depressive disorder   Axis II: No diagnosis  Donna Black  12/18/2012

## 2012-12-21 ENCOUNTER — Telehealth (HOSPITAL_COMMUNITY): Payer: Self-pay | Admitting: *Deleted

## 2012-12-21 NOTE — Telephone Encounter (Signed)
The attorney for Donna Black would like to speak with you. He reviewed the records sent and just has a few questions before they go to court on Monday.

## 2012-12-22 NOTE — Telephone Encounter (Signed)
I received a phone call from patient's Donna Black at [phone].  We have a consent from the patient did discuss her psychiatric illness prognosis and medication.  I did provide information about the patient's psychiatric history, current medication and compliance.

## 2013-01-01 ENCOUNTER — Encounter (HOSPITAL_COMMUNITY): Payer: Self-pay | Admitting: Psychiatry

## 2013-01-01 ENCOUNTER — Ambulatory Visit (INDEPENDENT_AMBULATORY_CARE_PROVIDER_SITE_OTHER): Payer: Commercial Managed Care - PPO | Admitting: Psychiatry

## 2013-01-01 DIAGNOSIS — F329 Major depressive disorder, single episode, unspecified: Secondary | ICD-10-CM

## 2013-01-01 NOTE — Progress Notes (Signed)
Patient ID: Donna Black, female   DOB: 05/04/1965, 47 y.o.   MRN: [id number]   Session Time: 11:00-11:50   Participation Level: Active   Behavioral Response: CasualAlertEuthymic   Type of Therapy: Individual Therapy   Treatment Goals addressed: emotion regulation, stress management, decision making   Interventions: CBT   Summary: Donna Black is a 47 y.o. female who presents with major depressive disorder.   Suicidal/Homicidal: Nowithout intent/plan   Therapist Response: Pt. Presents with good mood, reports that she is feels emotionally strong. Discussed wellness plan that Pt. Prepared and recent interactions with friends and desire to reconnect with old friends.  Plan: Return again in 2 weeks.   Diagnosis: Axis I: Major depressive disorder   Axis II: No diagnosis   [name]  01/01/2013

## 2013-01-02 ENCOUNTER — Other Ambulatory Visit (HOSPITAL_COMMUNITY): Payer: Self-pay | Admitting: Psychiatry

## 2013-01-03 ENCOUNTER — Other Ambulatory Visit (HOSPITAL_COMMUNITY): Payer: Self-pay | Admitting: Psychiatry

## 2013-01-04 ENCOUNTER — Ambulatory Visit (INDEPENDENT_AMBULATORY_CARE_PROVIDER_SITE_OTHER): Payer: Commercial Managed Care - PPO | Admitting: Psychiatry

## 2013-01-04 ENCOUNTER — Encounter (HOSPITAL_COMMUNITY): Payer: Self-pay | Admitting: Psychiatry

## 2013-01-04 VITALS — BP 132/70 | HR 117 | Ht 65.0 in | Wt 180.0 lb

## 2013-01-04 DIAGNOSIS — F331 Major depressive disorder, recurrent, moderate: Secondary | ICD-10-CM

## 2013-01-04 DIAGNOSIS — F329 Major depressive disorder, single episode, unspecified: Secondary | ICD-10-CM

## 2013-01-04 MED ORDER — BUPROPION HCL ER (XL) 150 MG PO TB24: ORAL_TABLET | ORAL | Status: DC

## 2013-01-04 MED ORDER — ARIPIPRAZOLE 5 MG PO TABS: ORAL_TABLET | ORAL | Status: DC

## 2013-01-04 MED ORDER — HYDROXYZINE PAMOATE 25 MG PO CAPS: 25.0000 mg | ORAL_CAPSULE | ORAL | Status: DC | PRN

## 2013-01-04 NOTE — Progress Notes (Signed)
[facility] [id number] Progress Note  Donna Black [id number] 47 y.o.  01/04/2013 3:56 PM  Chief Complaint:  Followup            History of Present Illness: Donna Black came for her followup appointment with her husband.  She is taking her medication and denies any side effects.  She is taking Abilify 7.5 mg daily and Wellbutrin XL 150 mg daily.  She has taken only 2-3 times to 25 mg for insomnia.  She has no concern that medication.  She cut down her drinking a lot.  Since last visit she only had 2 drink.  She denies any binge or any intoxication.  Today she came with a wellness and prevention plan to prevent any future crisis .  Her husband accompanied with the plan.  Patient appears more relaxed and calm.  She feels current medication is working.  Patient is still sleeping at hotel at night but usually stays all day at home.  She has a next court date however she is unclear if the husband will dismiss the case.  Patient and her husband working together for the reconciliation .  Husband is concerned about the future relapses and noncompliance with medication.  Donna Black agreed to continue her current medication and she realized that noncompliant with her medication make her more depressed and now she is taking the medication and feeling better.  She wants to continue counseling and therapy by Victorino Dike.  She is willing to allow her husband to call us if she is not doing very well.  In the past she was reluctant to have her husband to call us.  She does not believe that her husband is involved in a conspiracy.  She does not believe that husband is spying on her.  She denies any irritability anger or any crying spells.  She denied any suicidal thoughts or homicidal thoughts.  She denies any tremors or shakes.  She still gets emotional sometime but denies any anger.  Suicidal Ideation: No Plan Formed: No Patient has means to carry out plan: No  Homicidal Ideation: No Plan Formed: No Patient  has means to carry out plan: No  Review of Systems  Constitutional: Negative.   Eyes: Negative.   Respiratory: Negative.   Cardiovascular: Negative.   Musculoskeletal: Negative.   Neurological: Negative.   Psychiatric/Behavioral: Negative for suicidal ideas. The patient is nervous/anxious.    Psychiatric: Agitation: No Hallucination: No Depressed Mood: Yes Insomnia: Yes Hypersomnia: No Altered Concentration: No Feels Worthless: No Grandiose Ideas: No Belief In Special Powers: No New/Increased Substance Abuse: No Compulsions: No  Neurologic: Headache: No Seizure: No Paresthesias: No  Past Medical History  Diagnosis Date  . Wears glasses   . Depression   . Cancer     bilateral breast cancer  . Heart murmur     told off and on in past that she has had one  . Grave's disease     diagnosed about 7 yrs ago but after a year she has been doing well  . Anemia     when she was younger  . Complication of anesthesia     ?, heart rate dropped w/wisdom teeth   She see Dr Johnn Hai at [facility].   Past psychiatric history. Patient has been seeing in this office since 2009.  She was admitted at [facility] due to suicidal attempt when she took overdose on her medication.  At that time she was taking Prozac which was stopped.  Social History: Patient lives with her husband.  Outpatient Encounter Prescriptions as of 01/04/2013  Medication Sig  . ARIPiprazole (ABILIFY) 5 MG tablet Take 1 and 1/2 tab daily  . buPROPion (WELLBUTRIN XL) 150 MG 24 hr tablet TAKE 1 TABLET BY MOUTH EVERY MORNING  . hydrOXYzine (VISTARIL) 25 MG capsule Take 1 capsule (25 mg total) by mouth as needed for anxiety. Take 1 capsule as needed for anxiety and insomnia  . [DISCONTINUED] ARIPiprazole (ABILIFY) 5 MG tablet Take 1 and 1/2 tab daily  . [DISCONTINUED] buPROPion (WELLBUTRIN XL) 150 MG 24 hr tablet TAKE 1 TABLET BY MOUTH EVERY MORNING  . [DISCONTINUED] hydrOXYzine  (VISTARIL) 25 MG capsule Take 1 capsule (25 mg total) by mouth as needed for anxiety. Take 1 capsule as needed for anxiety and insomnia   Recent Results (from the past 2160 hour(s))  CBC     Status: None   Collection Time    10/09/12  2:39 PM      Result Value Range   WBC 6.4  4.0 - 10.5 K/uL   RBC 4.25  3.87 - 5.11 MIL/uL   Hemoglobin 14.4  12.0 - 15.0 g/dL   HCT 16.1  09.6 - 04.5 %   MCV 95.3  78.0 - 100.0 fL   MCH 33.9  26.0 - 34.0 pg   MCHC 35.6  30.0 - 36.0 g/dL   RDW 40.9  81.1 - 91.4 %   Platelets 255  150 - 400 K/uL  COMPREHENSIVE METABOLIC PANEL     Status: Abnormal   Collection Time    10/09/12  2:39 PM      Result Value Range   Sodium 137  135 - 145 mEq/L   Potassium 3.2 (*) 3.5 - 5.1 mEq/L   Chloride 100  96 - 112 mEq/L   CO2 16 (*) 19 - 32 mEq/L   Glucose, Bld 91  70 - 99 mg/dL   BUN 11  6 - 23 mg/dL   Creatinine, Ser 7.82  0.50 - 1.10 mg/dL   Calcium 9.2  8.4 - 95.6 mg/dL   Total Protein 7.2  6.0 - 8.3 g/dL   Albumin 3.9  3.5 - 5.2 g/dL   AST 24  0 - 37 U/L   ALT 15  0 - 35 U/L   Alkaline Phosphatase 48  39 - 117 U/L   Total Bilirubin 0.3  0.3 - 1.2 mg/dL   GFR calc non Af Amer >90  >90 mL/min   GFR calc Af Amer >90  >90 mL/min   Comment: (NOTE)     The eGFR has been calculated using the CKD EPI equation.     This calculation has not been validated in all clinical situations.     eGFR's persistently <90 mL/min signify possible Chronic Kidney     Disease.  ACETAMINOPHEN LEVEL     Status: Abnormal   Collection Time    10/09/12  2:39 PM      Result Value Range   Acetaminophen (Tylenol), Serum 161.0 (*) 10 - 30 ug/mL   Comment: MODERATE HEMOLYSIS     HEMOLYSIS AT THIS LEVEL MAY AFFECT RESULT                THERAPEUTIC CONCENTRATIONS VARY     SIGNIFICANTLY. A RANGE OF 10-30     ug/mL MAY BE AN EFFECTIVE     CONCENTRATION FOR MANY PATIENTS.     HOWEVER, SOME ARE BEST TREATED     AT CONCENTRATIONS OUTSIDE THIS  RANGE.     ACETAMINOPHEN CONCENTRATIONS      >150 ug/mL AT 4 HOURS AFTER     INGESTION AND >50 ug/mL AT 12     HOURS AFTER INGESTION ARE     OFTEN ASSOCIATED WITH TOXIC     REACTIONS.     CRITICAL RESULT CALLED TO, READ BACK BY AND VERIFIED WITH:     JEFFRIES,T.RN @1555  10/09/2012 BY WELLS,D.  SALICYLATE LEVEL     Status: Abnormal   Collection Time    10/09/12  2:39 PM      Result Value Range   Salicylate Lvl <2.0 (*) 2.8 - 20.0 mg/dL  ETHANOL     Status: Abnormal   Collection Time    10/09/12  2:39 PM      Result Value Range   Alcohol, Ethyl (B) 55 (*) 0 - 11 mg/dL   Comment:            LOWEST DETECTABLE LIMIT FOR     SERUM ALCOHOL IS 11 mg/dL     FOR MEDICAL PURPOSES ONLY  URINE RAPID DRUG SCREEN (HOSP PERFORMED)     Status: None   Collection Time    10/09/12  3:36 PM      Result Value Range   Opiates NONE DETECTED  NONE DETECTED   Cocaine NONE DETECTED  NONE DETECTED   Benzodiazepines NONE DETECTED  NONE DETECTED   Amphetamines NONE DETECTED  NONE DETECTED   Tetrahydrocannabinol NONE DETECTED  NONE DETECTED   Barbiturates NONE DETECTED  NONE DETECTED   Comment:            DRUG SCREEN FOR MEDICAL PURPOSES     ONLY.  IF CONFIRMATION IS NEEDED     FOR ANY PURPOSE, NOTIFY LAB     WITHIN 5 DAYS.                LOWEST DETECTABLE LIMITS     FOR URINE DRUG SCREEN     Drug Class       Cutoff (ng/mL)     Amphetamine      1000     Barbiturate      200     Benzodiazepine   200     Tricyclics       300     Opiates          300     Cocaine          300     THC              50  URINALYSIS, ROUTINE W REFLEX MICROSCOPIC     Status: Abnormal   Collection Time    10/09/12  3:36 PM      Result Value Range   Color, Urine YELLOW  YELLOW   APPearance CLEAR  CLEAR   Specific Gravity, Urine >1.046 (*) 1.005 - 1.030   pH 5.0  5.0 - 8.0   Glucose, UA NEGATIVE  NEGATIVE mg/dL   Hgb urine dipstick NEGATIVE  NEGATIVE   Bilirubin Urine SMALL (*) NEGATIVE   Ketones, ur 40 (*) NEGATIVE mg/dL   Protein, ur NEGATIVE  NEGATIVE  mg/dL   Urobilinogen, UA 0.2  0.0 - 1.0 mg/dL   Nitrite NEGATIVE  NEGATIVE   Leukocytes, UA NEGATIVE  NEGATIVE   Comment: MICROSCOPIC NOT DONE ON URINES WITH NEGATIVE PROTEIN, BLOOD, LEUKOCYTES, NITRITE, OR GLUCOSE <1000 mg/dL.  PROTIME-INR     Status: None   Collection Time    10/09/12  3:39  PM      Result Value Range   Prothrombin Time 12.9  11.6 - 15.2 seconds   INR 0.99  0.00 - 1.49  MRSA PCR SCREENING     Status: None   Collection Time    10/09/12  6:27 PM      Result Value Range   MRSA by PCR NEGATIVE  NEGATIVE   Comment:            The GeneXpert MRSA Assay (FDA     approved for NASAL specimens     only), is one component of a     comprehensive MRSA colonization     surveillance program. It is not     intended to diagnose MRSA     infection nor to guide or     monitor treatment for     MRSA infections.     Performed at [facility]  LACTIC ACID, PLASMA     Status: Abnormal   Collection Time    10/09/12  6:55 PM      Result Value Range   Lactic Acid, Venous 2.5 (*) 0.5 - 2.2 mmol/L  ACETAMINOPHEN LEVEL     Status: Abnormal   Collection Time    10/09/12  6:55 PM      Result Value Range   Acetaminophen (Tylenol), Serum 136.3 (*) 10 - 30 ug/mL   Comment:            THERAPEUTIC CONCENTRATIONS VARY     SIGNIFICANTLY. A RANGE OF 10-30     ug/mL MAY BE AN EFFECTIVE     CONCENTRATION FOR MANY PATIENTS.     HOWEVER, SOME ARE BEST TREATED     AT CONCENTRATIONS OUTSIDE THIS     RANGE.     ACETAMINOPHEN CONCENTRATIONS     >150 ug/mL AT 4 HOURS AFTER     INGESTION AND >50 ug/mL AT 12     HOURS AFTER INGESTION ARE     OFTEN ASSOCIATED WITH TOXIC     REACTIONS.  HEPATIC FUNCTION PANEL     Status: Abnormal   Collection Time    10/09/12  6:55 PM      Result Value Range   Total Protein 6.5  6.0 - 8.3 g/dL   Albumin 3.1 (*) 3.5 - 5.2 g/dL   AST 17  0 - 37 U/L   ALT 14  0 - 35 U/L   Alkaline Phosphatase 40  39 - 117 U/L   Total Bilirubin 0.2 (*) 0.3 - 1.2 mg/dL    Bilirubin, Direct <1.6  0.0 - 0.3 mg/dL   Indirect Bilirubin NOT CALCULATED  0.3 - 0.9 mg/dL  COMPREHENSIVE METABOLIC PANEL     Status: Abnormal   Collection Time    10/10/12  3:40 AM      Result Value Range   Sodium 135  135 - 145 mEq/L   Potassium 3.2 (*) 3.5 - 5.1 mEq/L   Chloride 104  96 - 112 mEq/L   CO2 17 (*) 19 - 32 mEq/L   Glucose, Bld 94  70 - 99 mg/dL   BUN 7  6 - 23 mg/dL   Creatinine, Ser 1.09  0.50 - 1.10 mg/dL   Calcium 8.0 (*) 8.4 - 10.5 mg/dL   Total Protein 5.9 (*) 6.0 - 8.3 g/dL   Albumin 2.9 (*) 3.5 - 5.2 g/dL   AST 22  0 - 37 U/L   ALT 16  0 - 35 U/L   Alkaline Phosphatase 37 (*)  39 - 117 U/L   Total Bilirubin 0.4  0.3 - 1.2 mg/dL   GFR calc non Af Amer >90  >90 mL/min   GFR calc Af Amer >90  >90 mL/min   Comment: (NOTE)     The eGFR has been calculated using the CKD EPI equation.     This calculation has not been validated in all clinical situations.     eGFR's persistently <90 mL/min signify possible Chronic Kidney     Disease.  PROTIME-INR     Status: Abnormal   Collection Time    10/10/12  3:40 AM      Result Value Range   Prothrombin Time 15.6 (*) 11.6 - 15.2 seconds   INR 1.27  0.00 - 1.49  ACETAMINOPHEN LEVEL     Status: None   Collection Time    10/10/12  3:40 AM      Result Value Range   Acetaminophen (Tylenol), Serum <15.0  10 - 30 ug/mL   Comment:            THERAPEUTIC CONCENTRATIONS VARY     SIGNIFICANTLY. A RANGE OF 10-30     ug/mL MAY BE AN EFFECTIVE     CONCENTRATION FOR MANY PATIENTS.     HOWEVER, SOME ARE BEST TREATED     AT CONCENTRATIONS OUTSIDE THIS     RANGE.     ACETAMINOPHEN CONCENTRATIONS     >150 ug/mL AT 4 HOURS AFTER     INGESTION AND >50 ug/mL AT 12     HOURS AFTER INGESTION ARE     OFTEN ASSOCIATED WITH TOXIC     REACTIONS.  CBC     Status: None   Collection Time    10/10/12  3:40 AM      Result Value Range   WBC 8.7  4.0 - 10.5 K/uL   RBC 4.11  3.87 - 5.11 MIL/uL   Hemoglobin 13.5  12.0 - 15.0 g/dL   HCT  40.9  81.1 - 91.4 %   MCV 95.9  78.0 - 100.0 fL   MCH 32.8  26.0 - 34.0 pg   MCHC 34.3  30.0 - 36.0 g/dL   RDW 78.2  95.6 - 21.3 %   Platelets 258  150 - 400 K/uL  PROTIME-INR     Status: None   Collection Time    10/10/12  3:46 PM      Result Value Range   Prothrombin Time 14.7  11.6 - 15.2 seconds   INR 1.17  0.00 - 1.49  COMPREHENSIVE METABOLIC PANEL     Status: Abnormal   Collection Time    10/10/12  3:46 PM      Result Value Range   Sodium 136  135 - 145 mEq/L   Potassium 3.9  3.5 - 5.1 mEq/L   Comment: REPEATED TO VERIFY     DELTA CHECK NOTED   Chloride 106  96 - 112 mEq/L   CO2 18 (*) 19 - 32 mEq/L   Glucose, Bld 87  70 - 99 mg/dL   BUN 5 (*) 6 - 23 mg/dL   Creatinine, Ser 0.86  0.50 - 1.10 mg/dL   Calcium 8.6  8.4 - 57.8 mg/dL   Total Protein 6.1  6.0 - 8.3 g/dL   Albumin 3.0 (*) 3.5 - 5.2 g/dL   AST 15  0 - 37 U/L   ALT 13  0 - 35 U/L   Alkaline Phosphatase 39  39 - 117 U/L   Total  Bilirubin 0.3  0.3 - 1.2 mg/dL   GFR calc non Af Amer >90  >90 mL/min   GFR calc Af Amer >90  >90 mL/min   Comment: (NOTE)     The eGFR has been calculated using the CKD EPI equation.     This calculation has not been validated in all clinical situations.     eGFR's persistently <90 mL/min signify possible Chronic Kidney     Disease.  ACETAMINOPHEN LEVEL     Status: None   Collection Time    10/10/12  3:46 PM      Result Value Range   Acetaminophen (Tylenol), Serum <15.0  10 - 30 ug/mL   Comment:            THERAPEUTIC CONCENTRATIONS VARY     SIGNIFICANTLY. A RANGE OF 10-30     ug/mL MAY BE AN EFFECTIVE     CONCENTRATION FOR MANY PATIENTS.     HOWEVER, SOME ARE BEST TREATED     AT CONCENTRATIONS OUTSIDE THIS     RANGE.     ACETAMINOPHEN CONCENTRATIONS     >150 ug/mL AT 4 HOURS AFTER     INGESTION AND >50 ug/mL AT 12     HOURS AFTER INGESTION ARE     OFTEN ASSOCIATED WITH TOXIC     REACTIONS.  COMPREHENSIVE METABOLIC PANEL     Status: Abnormal   Collection Time     10/11/12  4:20 AM      Result Value Range   Sodium 135  135 - 145 mEq/L   Potassium 3.5  3.5 - 5.1 mEq/L   Chloride 106  96 - 112 mEq/L   CO2 18 (*) 19 - 32 mEq/L   Glucose, Bld 82  70 - 99 mg/dL   BUN 5 (*) 6 - 23 mg/dL   Creatinine, Ser 0.45  0.50 - 1.10 mg/dL   Calcium 8.7  8.4 - 40.9 mg/dL   Total Protein 6.0  6.0 - 8.3 g/dL   Albumin 3.0 (*) 3.5 - 5.2 g/dL   AST 13  0 - 37 U/L   ALT 12  0 - 35 U/L   Alkaline Phosphatase 39  39 - 117 U/L   Total Bilirubin 0.1 (*) 0.3 - 1.2 mg/dL   GFR calc non Af Amer >90  >90 mL/min   GFR calc Af Amer >90  >90 mL/min   Comment: (NOTE)     The eGFR has been calculated using the CKD EPI equation.     This calculation has not been validated in all clinical situations.     eGFR's persistently <90 mL/min signify possible Chronic Kidney     Disease.  PROTIME-INR     Status: None   Collection Time    10/11/12  4:20 AM      Result Value Range   Prothrombin Time 13.5  11.6 - 15.2 seconds   INR 1.05  0.00 - 1.49    Past Psychiatric History/Hospitalization(s): Anxiety: Yes Bipolar Disorder: No Depression: Yes Mania: No Psychosis: Yes Schizophrenia: No Personality Disorder: No Hospitalization for psychiatric illness: Yes History of Electroconvulsive Shock Therapy: No Prior Suicide Attempts: Yes  Physical Exam: Constitutional:  BP 132/70  Pulse 117  Ht 5\' 5"  (1.651 m)  Wt 180 lb (81.647 kg)  BMI 29.95 kg/m2  General Appearance: well nourished but tired.    Musculoskeletal: Strength & Muscle Tone: within normal limits Gait & Station: normal Patient leans: N/A  Psychiatric: Speech (describe rate, volume, coherence, spontaneity,  and abnormalities if any): Clear and coherent.  Thought Process (describe rate, content, abstract reasoning, and computation): Logical and goal-directed.  Associations: Coherent, Relevant and Intact  Thoughts: normal  Mental Status: Orientation: oriented to person, place, time/date and situation Mood  & Affect: depressed affect and anxiety Attention Span & Concentration: Fair  Medical Decision Making (Choose Three): Established Problem, Stable/Improving (1), Review of Psycho-Social Stressors (1), Review of Last Therapy Session (1) and Review of New Medication or Change in Dosage (2)  Assessment: Axis I: Maj. depressive disorder  Axis II: Deferred  Axis III: See medical history  Axis IV: Mild  Axis V: 65-70   Plan:  I had a long discussion with the patient and her husband.  Discussed in detail the plan .  I also involve Victorino Dike in the discussion also agreed to the plan.  I will continue Abilify 7.5 mg daily and Wellbutrin XL 150 mg daily.  Recommend to use Vistaril 25 mg as needed for anxiety and insomnia.  Discussed in detail the risks and benefits of medication.  We discussed about trigger factors, relapse symptoms in continuity of care.  This time we agreed to followup in 6 weeks.  Patient and her husband agree with the plan.  Time spent 25 minutes. Discuss safety plan that anytime having active suicidal thoughts or homicidal thoughts then patient need to call 911 or go to the local emergency room.  Arvon Schreiner T., MD 01/04/2013

## 2013-01-08 ENCOUNTER — Encounter (HOSPITAL_COMMUNITY): Payer: Self-pay | Admitting: Psychiatry

## 2013-01-08 ENCOUNTER — Ambulatory Visit (INDEPENDENT_AMBULATORY_CARE_PROVIDER_SITE_OTHER): Payer: Commercial Managed Care - PPO | Admitting: Psychiatry

## 2013-01-08 DIAGNOSIS — F329 Major depressive disorder, single episode, unspecified: Secondary | ICD-10-CM

## 2013-01-08 NOTE — Progress Notes (Signed)
Patient ID: Donna Black, female   DOB: 1965/12/18, 47 y.o.   MRN: [id number]  Session Time: 11:00-11:50   Participation Level: Active   Behavioral Response: CasualAlertEuthymic   Type of Therapy: Individual Therapy   Treatment Goals addressed: emotion regulation, stress management, decision making   Interventions: CBT   Summary: [name] is a 47 y.o. female who presents with major depressive disorder.   Suicidal/Homicidal: Nowithout intent/plan   Therapist Response: Pt. Continues to present with good mood. Pt. Reports that she is in a good place emotionally and encouraged by progress being made in the marital relationship. Pt. Continues to be hopeful that she and husband will reconcile and that phase in period of returning to the home will be successful and result in returning to the home permanently. Pt. Focused on restarting career and developing volunteer opportunities.  Plan: Return again in 2 weeks.   Diagnosis: Axis I: Major depressive disorder   Axis II: No diagnosis   [name]  01/08/2013

## 2013-01-15 ENCOUNTER — Encounter (HOSPITAL_COMMUNITY): Payer: Self-pay | Admitting: Psychiatry

## 2013-01-15 ENCOUNTER — Ambulatory Visit (INDEPENDENT_AMBULATORY_CARE_PROVIDER_SITE_OTHER): Payer: Commercial Managed Care - PPO | Admitting: Psychiatry

## 2013-01-15 DIAGNOSIS — F329 Major depressive disorder, single episode, unspecified: Secondary | ICD-10-CM

## 2013-01-15 NOTE — Progress Notes (Signed)
Patient ID: Susanne Greenhouse, female   DOB: [birth year], 47 y.o.   MRN: [id number]  Session Time: 10:00-10:50   Participation Level: Active   Behavioral Response: CasualAlertEuthymic   Type of Therapy: Individual Therapy   Treatment Goals addressed: emotion regulation, stress management, decision making   Interventions: CBT   Summary: Maryland Stell is a 47 y.o. female who presents with major depressive disorder.   Suicidal/Homicidal: Nowithout intent/plan   Therapist Response: Pt. Continues to present with good mood, makes appropriate eye contact, smiles and laughs appropriately. Pt. Reports that she and her husband are continuing their phase-in period with plans for her to reenter the home full-time and permanently after Thanksgiving. Session focused on cultivating relationships and developing empathy for husband's family members and coping strategies for managing difficult relationships.    Plan: Return again in 2 weeks.   Diagnosis: Axis I: Major depressive disorder  Axis II: No diagnosis  [name]  01/15/2013

## 2013-01-22 ENCOUNTER — Encounter (HOSPITAL_COMMUNITY): Payer: Self-pay | Admitting: Psychiatry

## 2013-01-22 ENCOUNTER — Ambulatory Visit (INDEPENDENT_AMBULATORY_CARE_PROVIDER_SITE_OTHER): Payer: Commercial Managed Care - PPO | Admitting: Psychiatry

## 2013-01-22 DIAGNOSIS — F329 Major depressive disorder, single episode, unspecified: Secondary | ICD-10-CM

## 2013-01-22 NOTE — Progress Notes (Signed)
Patient ID: Donna Black, female   DOB: Jan 27, 1966, 47 y.o.   MRN: [id number]  Session Time: 10:00-10:50   Participation Level: Active   Behavioral Response: CasualAlertEuthymic   Type of Therapy: Individual Therapy   Treatment Goals addressed: emotion regulation, stress management, self-esteem   Interventions: CBT   Summary: [name] is a 47 y.o. female who presents with major depressive disorder.   Suicidal/Homicidal: Nowithout intent/plan   Therapist Response: Pt. Continues to present with good mood, makes appropriate eye contact, smiles and laughs appropriately. Pt. Reports that she has moved back into the home permanently since Thanksgiving. Processed anxiety related to restarting friendships post depression. Developed treatment plan.  Plan: Pt. Is to continue meditation practice and identifying attributes associated with authentic/core self. Return again in 2 weeks.   Diagnosis: Axis I: Major depressive disorder   Axis II: No diagnosis  [name]  01/22/2013

## 2013-01-29 ENCOUNTER — Ambulatory Visit (INDEPENDENT_AMBULATORY_CARE_PROVIDER_SITE_OTHER): Payer: Commercial Managed Care - PPO | Admitting: Psychiatry

## 2013-01-29 DIAGNOSIS — F329 Major depressive disorder, single episode, unspecified: Secondary | ICD-10-CM

## 2013-01-30 ENCOUNTER — Encounter (HOSPITAL_COMMUNITY): Payer: Self-pay | Admitting: Psychiatry

## 2013-01-30 NOTE — Progress Notes (Signed)
Patient ID: Donna Black, female   DOB: 23-Jun-1965, 47 y.o.   MRN: [id number]  Session Time: 10:00-10:50   Participation Level: Active   Behavioral Response: CasualAlertEuthymic   Type of Therapy: Individual Therapy   Treatment Goals addressed: emotion regulation, stress management, self-esteem   Interventions: CBT   Summary: [name] is a 47 y.o. female who presents with major depressive disorder.   Suicidal/Homicidal: Nowithout intent/plan   Therapist Response: Pt. Continues to present with good mood, makes appropriate eye contact, smiles and laughs appropriately. Pt. Reports progress in renewing friend relationships post depression episode and progress in trust building in relationship with husband.  Plan: Pt. Is to continue meditation practice and identifying attributes associated with authentic/core self. Return again in 2 weeks.   Diagnosis: Axis I: Major depressive disorder   Axis II: No diagnosis   [name]  01/29/2013

## 2013-02-05 ENCOUNTER — Encounter (HOSPITAL_COMMUNITY): Payer: Self-pay | Admitting: Psychiatry

## 2013-02-05 ENCOUNTER — Ambulatory Visit (INDEPENDENT_AMBULATORY_CARE_PROVIDER_SITE_OTHER): Payer: Commercial Managed Care - PPO | Admitting: Psychiatry

## 2013-02-05 DIAGNOSIS — F329 Major depressive disorder, single episode, unspecified: Secondary | ICD-10-CM

## 2013-02-05 NOTE — Progress Notes (Signed)
Patient ID: Donna Black, female   DOB: 1965/06/16, 47 y.o.   MRN: [id number]  Session Time: 10:00-10:50   Participation Level: Active   Behavioral Response: CasualAlertEuthymic   Type of Therapy: Individual Therapy    Treatment Goals addressed: emotion regulation, stress management, self-esteem   Interventions: CBT   Summary: Donna Black is a 47 y.o. female who presents with major depressive disorder.   Suicidal/Homicidal: Nowithout intent/plan   Therapist Response: Pt. Continues to present with good mood, makes appropriate eye contact, smiles and laughs appropriately. Session focused on argument-communication pattern with husband (i.e., pursue/retreat), spiritual/religious beliefs, parenting strengths.  Plan: Pt. Is to continue meditation practice and identifying attributes associated with authentic/core self. Return again in 2 weeks.   Diagnosis: Axis I: Major depressive disorder   Axis II: No diagnosis   [name]  02/05/2013

## 2013-02-19 ENCOUNTER — Ambulatory Visit (HOSPITAL_COMMUNITY): Payer: Self-pay | Admitting: Psychiatry

## 2013-02-20 ENCOUNTER — Ambulatory Visit (HOSPITAL_COMMUNITY): Payer: Self-pay | Admitting: Psychiatry

## 2013-02-26 ENCOUNTER — Ambulatory Visit (INDEPENDENT_AMBULATORY_CARE_PROVIDER_SITE_OTHER): Payer: Commercial Managed Care - PPO | Admitting: Psychiatry

## 2013-02-26 ENCOUNTER — Encounter (HOSPITAL_COMMUNITY): Payer: Self-pay | Admitting: Psychiatry

## 2013-02-26 VITALS — Wt 190.0 lb

## 2013-02-26 DIAGNOSIS — F331 Major depressive disorder, recurrent, moderate: Secondary | ICD-10-CM

## 2013-02-26 DIAGNOSIS — F329 Major depressive disorder, single episode, unspecified: Secondary | ICD-10-CM

## 2013-02-26 MED ORDER — ARIPIPRAZOLE 5 MG PO TABS: ORAL_TABLET | ORAL | Status: DC

## 2013-02-26 MED ORDER — BUPROPION HCL ER (XL) 150 MG PO TB24: ORAL_TABLET | ORAL | Status: DC

## 2013-02-26 NOTE — Progress Notes (Signed)
[place] Progress Note  Donna Black [id number] 48 y.o.  02/26/2013 9:00 AM  Chief Complaint:  Followup            History of Present Illness: Donna Black came for her followup appointment.  She is doing much better on her current medication.  She had a good Christmas.  She spent the time with the husband.  Patient told that they are going to a marriage counselor 2 hours a week and it is going very well.  She is also seeing Anderson Malta for individual counseling.  Recently patient and her husband started doing projects together.  She is sleeping better.  She denies any irritability, anger or any mood swing.  She denies any crying spells.  She does not require Vistaril every night and she still has refill remaining.  She has no tremors or shakes.  She cut down her drinking.  She denies any hallucinations or any paranoia.  She is happy that her husband dismissed the case from the court.  Suicidal Ideation: No Plan Formed: No Patient has means to carry out plan: No  Homicidal Ideation: No Plan Formed: No Patient has means to carry out plan: No  ROS Psychiatric: Agitation: No Hallucination: No Depressed Mood: No Insomnia: No Hypersomnia: No Altered Concentration: No Feels Worthless: No Grandiose Ideas: No Belief In Special Powers: No New/Increased Substance Abuse: No Compulsions: No  Neurologic: Headache: No Seizure: No Paresthesias: No  Past Medical History  Diagnosis Date  . Wears glasses   . Depression   . Cancer     bilateral breast cancer  . Heart murmur     told off and on in past that she has had one  . Grave's disease     diagnosed about 7 yrs ago but after a year she has been doing well  . Anemia     when she was younger  . Complication of anesthesia     ?, heart rate dropped w/wisdom teeth   She see Dr Alessandra Grout at [facility].   Past psychiatric history. Patient has been seeing in this office since 2009.  She was admitted at  [facility] due to suicidal attempt when she took overdose on her medication.  At that time she was taking Prozac which was stopped.    Social History: Patient lives with her husband.  Outpatient Encounter Prescriptions as of 02/26/2013  Medication Sig  . ARIPiprazole (ABILIFY) 5 MG tablet Take 1 and 1/2 tab daily  . buPROPion (WELLBUTRIN XL) 150 MG 24 hr tablet take 1 tablet by mouth every morning  . hydrOXYzine (VISTARIL) 25 MG capsule Take 1 capsule (25 mg total) by mouth as needed for anxiety. Take 1 capsule as needed for anxiety and insomnia  . [DISCONTINUED] ARIPiprazole (ABILIFY) 5 MG tablet Take 1 and 1/2 tab daily  . [DISCONTINUED] buPROPion (WELLBUTRIN XL) 150 MG 24 hr tablet take 1 tablet by mouth every morning  . [DISCONTINUED] buPROPion (WELLBUTRIN XL) 150 MG 24 hr tablet TAKE 1 TABLET BY MOUTH EVERY MORNING   Recent Results (from the past 2160 hour(s))  CBC     Status: None   Collection Time    10/09/12  2:39 PM      Result Value Range   WBC 6.4  4.0 - 10.5 K/uL   RBC 4.25  3.87 - 5.11 MIL/uL   Hemoglobin 14.4  12.0 - 15.0 g/dL   HCT 40.5  36.0 - 46.0 %   MCV 95.3  78.0 - 100.0 fL   MCH 33.9  26.0 - 34.0 pg   MCHC 35.6  30.0 - 36.0 g/dL   RDW 12.3  11.5 - 15.5 %   Platelets 255  150 - 400 K/uL  COMPREHENSIVE METABOLIC PANEL     Status: Abnormal   Collection Time    10/09/12  2:39 PM      Result Value Range   Sodium 137  135 - 145 mEq/L   Potassium 3.2 (*) 3.5 - 5.1 mEq/L   Chloride 100  96 - 112 mEq/L   CO2 16 (*) 19 - 32 mEq/L   Glucose, Bld 91  70 - 99 mg/dL   BUN 11  6 - 23 mg/dL   Creatinine, Ser 0.61  0.50 - 1.10 mg/dL   Calcium 9.2  8.4 - 10.5 mg/dL   Total Protein 7.2  6.0 - 8.3 g/dL   Albumin 3.9  3.5 - 5.2 g/dL   AST 24  0 - 37 U/L   ALT 15  0 - 35 U/L   Alkaline Phosphatase 48  39 - 117 U/L   Total Bilirubin 0.3  0.3 - 1.2 mg/dL   GFR calc non Af Amer >90  >90 mL/min   GFR calc Af Amer >90  >90 mL/min   Comment: (NOTE)     The  eGFR has been calculated using the CKD EPI equation.     This calculation has not been validated in all clinical situations.     eGFR's persistently <90 mL/min signify possible Chronic Kidney     Disease.  ACETAMINOPHEN LEVEL     Status: Abnormal   Collection Time    10/09/12  2:39 PM      Result Value Range   Acetaminophen (Tylenol), Serum 161.0 (*) 10 - 30 ug/mL   Comment: MODERATE HEMOLYSIS     HEMOLYSIS AT THIS LEVEL MAY AFFECT RESULT                THERAPEUTIC CONCENTRATIONS VARY     SIGNIFICANTLY. A RANGE OF 10-30     ug/mL MAY BE AN EFFECTIVE     CONCENTRATION FOR MANY PATIENTS.     HOWEVER, SOME ARE BEST TREATED     AT CONCENTRATIONS OUTSIDE THIS     RANGE.     ACETAMINOPHEN CONCENTRATIONS     >150 ug/mL AT 4 HOURS AFTER     INGESTION AND >50 ug/mL AT 12     HOURS AFTER INGESTION ARE     OFTEN ASSOCIATED WITH TOXIC     REACTIONS.     CRITICAL RESULT CALLED TO, READ BACK BY AND VERIFIED WITH:     JEFFRIES,T.RN $RemoveBefore'@[id number]'[id number]$  5/73/2202 BY WELLS,D.  SALICYLATE LEVEL     Status: Abnormal   Collection Time    10/09/12  2:39 PM      Result Value Range   Salicylate Lvl <5.4 (*) 2.8 - 20.0 mg/dL  ETHANOL     Status: Abnormal   Collection Time    10/09/12  2:39 PM      Result Value Range   Alcohol, Ethyl (B) 55 (*) 0 - 11 mg/dL   Comment:            LOWEST DETECTABLE LIMIT FOR     SERUM ALCOHOL IS 11 mg/dL     FOR MEDICAL PURPOSES ONLY  URINE RAPID DRUG SCREEN (HOSP PERFORMED)     Status: None   Collection Time    10/09/12  3:36 PM  Result Value Range   Opiates NONE DETECTED  NONE DETECTED   Cocaine NONE DETECTED  NONE DETECTED   Benzodiazepines NONE DETECTED  NONE DETECTED   Amphetamines NONE DETECTED  NONE DETECTED   Tetrahydrocannabinol NONE DETECTED  NONE DETECTED   Barbiturates NONE DETECTED  NONE DETECTED   Comment:            DRUG SCREEN FOR MEDICAL PURPOSES     ONLY.  IF CONFIRMATION IS NEEDED     FOR ANY PURPOSE, NOTIFY LAB     WITHIN 5 DAYS.                 LOWEST DETECTABLE LIMITS     FOR URINE DRUG SCREEN     Drug Class       Cutoff (ng/mL)     Amphetamine      1000     Barbiturate      200     Benzodiazepine   353     Tricyclics       614     Opiates          300     Cocaine          300     THC              50  URINALYSIS, ROUTINE W REFLEX MICROSCOPIC     Status: Abnormal   Collection Time    10/09/12  3:36 PM      Result Value Range   Color, Urine YELLOW  YELLOW   APPearance CLEAR  CLEAR   Specific Gravity, Urine >1.046 (*) 1.005 - 1.030   pH 5.0  5.0 - 8.0   Glucose, UA NEGATIVE  NEGATIVE mg/dL   Hgb urine dipstick NEGATIVE  NEGATIVE   Bilirubin Urine SMALL (*) NEGATIVE   Ketones, ur 40 (*) NEGATIVE mg/dL   Protein, ur NEGATIVE  NEGATIVE mg/dL   Urobilinogen, UA 0.2  0.0 - 1.0 mg/dL   Nitrite NEGATIVE  NEGATIVE   Leukocytes, UA NEGATIVE  NEGATIVE   Comment: MICROSCOPIC NOT DONE ON URINES WITH NEGATIVE PROTEIN, BLOOD, LEUKOCYTES, NITRITE, OR GLUCOSE <1000 mg/dL.  PROTIME-INR     Status: None   Collection Time    10/09/12  3:39 PM      Result Value Range   Prothrombin Time 12.9  11.6 - 15.2 seconds   INR 0.99  0.00 - 1.49  MRSA PCR SCREENING     Status: None   Collection Time    10/09/12  6:27 PM      Result Value Range   MRSA by PCR NEGATIVE  NEGATIVE   Comment:            The GeneXpert MRSA Assay (FDA     approved for NASAL specimens     only), is one component of a     comprehensive MRSA colonization     surveillance program. It is not     intended to diagnose MRSA     infection nor to guide or     monitor treatment for     MRSA infections.     Performed at [facility]     Status: Abnormal   Collection Time    10/09/12  6:55 PM      Result Value Range   Lactic Acid, Venous 2.5 (*) 0.5 - 2.2 mmol/L  ACETAMINOPHEN LEVEL     Status: Abnormal   Collection Time  10/09/12  6:55 PM      Result Value Range   Acetaminophen (Tylenol), Serum 136.3 (*) 10 - 30 ug/mL   Comment:             THERAPEUTIC CONCENTRATIONS VARY     SIGNIFICANTLY. A RANGE OF 10-30     ug/mL MAY BE AN EFFECTIVE     CONCENTRATION FOR MANY PATIENTS.     HOWEVER, SOME ARE BEST TREATED     AT CONCENTRATIONS OUTSIDE THIS     RANGE.     ACETAMINOPHEN CONCENTRATIONS     >150 ug/mL AT 4 HOURS AFTER     INGESTION AND >50 ug/mL AT 12     HOURS AFTER INGESTION ARE     OFTEN ASSOCIATED WITH TOXIC     REACTIONS.  HEPATIC FUNCTION PANEL     Status: Abnormal   Collection Time    10/09/12  6:55 PM      Result Value Range   Total Protein 6.5  6.0 - 8.3 g/dL   Albumin 3.1 (*) 3.5 - 5.2 g/dL   AST 17  0 - 37 U/L   ALT 14  0 - 35 U/L   Alkaline Phosphatase 40  39 - 117 U/L   Total Bilirubin 0.2 (*) 0.3 - 1.2 mg/dL   Bilirubin, Direct <0.1  0.0 - 0.3 mg/dL   Indirect Bilirubin NOT CALCULATED  0.3 - 0.9 mg/dL  COMPREHENSIVE METABOLIC PANEL     Status: Abnormal   Collection Time    10/10/12  3:40 AM      Result Value Range   Sodium 135  135 - 145 mEq/L   Potassium 3.2 (*) 3.5 - 5.1 mEq/L   Chloride 104  96 - 112 mEq/L   CO2 17 (*) 19 - 32 mEq/L   Glucose, Bld 94  70 - 99 mg/dL   BUN 7  6 - 23 mg/dL   Creatinine, Ser 0.51  0.50 - 1.10 mg/dL   Calcium 8.0 (*) 8.4 - 10.5 mg/dL   Total Protein 5.9 (*) 6.0 - 8.3 g/dL   Albumin 2.9 (*) 3.5 - 5.2 g/dL   AST 22  0 - 37 U/L   ALT 16  0 - 35 U/L   Alkaline Phosphatase 37 (*) 39 - 117 U/L   Total Bilirubin 0.4  0.3 - 1.2 mg/dL   GFR calc non Af Amer >90  >90 mL/min   GFR calc Af Amer >90  >90 mL/min   Comment: (NOTE)     The eGFR has been calculated using the CKD EPI equation.     This calculation has not been validated in all clinical situations.     eGFR's persistently <90 mL/min signify possible Chronic Kidney     Disease.  PROTIME-INR     Status: Abnormal   Collection Time    10/10/12  3:40 AM      Result Value Range   Prothrombin Time 15.6 (*) 11.6 - 15.2 seconds   INR 1.27  0.00 - 1.49  ACETAMINOPHEN LEVEL     Status: None   Collection Time     10/10/12  3:40 AM      Result Value Range   Acetaminophen (Tylenol), Serum <15.0  10 - 30 ug/mL   Comment:            THERAPEUTIC CONCENTRATIONS VARY     SIGNIFICANTLY. A RANGE OF 10-30     ug/mL MAY BE AN EFFECTIVE     CONCENTRATION FOR MANY  PATIENTS.     HOWEVER, SOME ARE BEST TREATED     AT CONCENTRATIONS OUTSIDE THIS     RANGE.     ACETAMINOPHEN CONCENTRATIONS     >150 ug/mL AT 4 HOURS AFTER     INGESTION AND >50 ug/mL AT 12     HOURS AFTER INGESTION ARE     OFTEN ASSOCIATED WITH TOXIC     REACTIONS.  CBC     Status: None   Collection Time    10/10/12  3:40 AM      Result Value Range   WBC 8.7  4.0 - 10.5 K/uL   RBC 4.11  3.87 - 5.11 MIL/uL   Hemoglobin 13.5  12.0 - 15.0 g/dL   HCT 39.4  36.0 - 46.0 %   MCV 95.9  78.0 - 100.0 fL   MCH 32.8  26.0 - 34.0 pg   MCHC 34.3  30.0 - 36.0 g/dL   RDW 12.4  11.5 - 15.5 %   Platelets 258  150 - 400 K/uL  PROTIME-INR     Status: None   Collection Time    10/10/12  3:46 PM      Result Value Range   Prothrombin Time 14.7  11.6 - 15.2 seconds   INR 1.17  0.00 - 1.49  COMPREHENSIVE METABOLIC PANEL     Status: Abnormal   Collection Time    10/10/12  3:46 PM      Result Value Range   Sodium 136  135 - 145 mEq/L   Potassium 3.9  3.5 - 5.1 mEq/L   Comment: REPEATED TO VERIFY     DELTA CHECK NOTED   Chloride 106  96 - 112 mEq/L   CO2 18 (*) 19 - 32 mEq/L   Glucose, Bld 87  70 - 99 mg/dL   BUN 5 (*) 6 - 23 mg/dL   Creatinine, Ser 0.56  0.50 - 1.10 mg/dL   Calcium 8.6  8.4 - 10.5 mg/dL   Total Protein 6.1  6.0 - 8.3 g/dL   Albumin 3.0 (*) 3.5 - 5.2 g/dL   AST 15  0 - 37 U/L   ALT 13  0 - 35 U/L   Alkaline Phosphatase 39  39 - 117 U/L   Total Bilirubin 0.3  0.3 - 1.2 mg/dL   GFR calc non Af Amer >90  >90 mL/min   GFR calc Af Amer >90  >90 mL/min   Comment: (NOTE)     The eGFR has been calculated using the CKD EPI equation.     This calculation has not been validated in all clinical situations.     eGFR's persistently <90  mL/min signify possible Chronic Kidney     Disease.  ACETAMINOPHEN LEVEL     Status: None   Collection Time    10/10/12  3:46 PM      Result Value Range   Acetaminophen (Tylenol), Serum <15.0  10 - 30 ug/mL   Comment:            THERAPEUTIC CONCENTRATIONS VARY     SIGNIFICANTLY. A RANGE OF 10-30     ug/mL MAY BE AN EFFECTIVE     CONCENTRATION FOR MANY PATIENTS.     HOWEVER, SOME ARE BEST TREATED     AT CONCENTRATIONS OUTSIDE THIS     RANGE.     ACETAMINOPHEN CONCENTRATIONS     >150 ug/mL AT 4 HOURS AFTER     INGESTION AND >50 ug/mL AT 12  HOURS AFTER INGESTION ARE     OFTEN ASSOCIATED WITH TOXIC     REACTIONS.  COMPREHENSIVE METABOLIC PANEL     Status: Abnormal   Collection Time    10/11/12  4:20 AM      Result Value Range   Sodium 135  135 - 145 mEq/L   Potassium 3.5  3.5 - 5.1 mEq/L   Chloride 106  96 - 112 mEq/L   CO2 18 (*) 19 - 32 mEq/L   Glucose, Bld 82  70 - 99 mg/dL   BUN 5 (*) 6 - 23 mg/dL   Creatinine, Ser 0.59  0.50 - 1.10 mg/dL   Calcium 8.7  8.4 - 10.5 mg/dL   Total Protein 6.0  6.0 - 8.3 g/dL   Albumin 3.0 (*) 3.5 - 5.2 g/dL   AST 13  0 - 37 U/L   ALT 12  0 - 35 U/L   Alkaline Phosphatase 39  39 - 117 U/L   Total Bilirubin 0.1 (*) 0.3 - 1.2 mg/dL   GFR calc non Af Amer >90  >90 mL/min   GFR calc Af Amer >90  >90 mL/min   Comment: (NOTE)     The eGFR has been calculated using the CKD EPI equation.     This calculation has not been validated in all clinical situations.     eGFR's persistently <90 mL/min signify possible Chronic Kidney     Disease.  PROTIME-INR     Status: None   Collection Time    10/11/12  4:20 AM      Result Value Range   Prothrombin Time 13.5  11.6 - 15.2 seconds   INR 1.05  0.00 - 1.49    Past Psychiatric History/Hospitalization(s): Anxiety: Yes Bipolar Disorder: No Depression: Yes Mania: No Psychosis: Yes Schizophrenia: No Personality Disorder: No Hospitalization for psychiatric illness: Yes History of  Electroconvulsive Shock Therapy: No Prior Suicide Attempts: Yes  Physical Exam: Constitutional:  Wt 190 lb (86.183 kg)  General Appearance: alert, oriented, no acute distress and well nourished   Musculoskeletal: Strength & Muscle Tone: within normal limits Gait & Station: normal Patient leans: N/A  Psychiatric: Speech (describe rate, volume, coherence, spontaneity, and abnormalities if any): Clear and coherent.  Thought Process (describe rate, content, abstract reasoning, and computation): Logical and goal-directed.  Associations: Coherent, Relevant and Intact  Thoughts: normal  Mental Status: Orientation: oriented to person, place, time/date and situation Mood & Affect: normal affect Attention Span & Concentration: Fair  Medical Decision Making (Choose Three): Established Problem, Stable/Improving (1), Review of Psycho-Social Stressors (1), Review of Last Therapy Session (1) and Review of Medication Regimen & Side Effects (2)  Assessment: Axis I: Maj. depressive disorder  Axis II: Deferred  Axis III: See medical history  Axis IV: Mild  Axis V: 65-70   Plan:  Patient is doing much better on her current medication.  I recommend to continue Wellbutrin 150 mg daily and Abilify 7.5 mg at bedtime.  Recommended to see Anderson Malta for counseling.  Followup in 3 months.  Patient still has refills remaining on Vistaril.  Recommend to cause back if she has any questions or any concern. ARFEEN,SYED T., MD 02/26/2013

## 2013-02-27 ENCOUNTER — Ambulatory Visit (INDEPENDENT_AMBULATORY_CARE_PROVIDER_SITE_OTHER): Payer: Commercial Managed Care - PPO | Admitting: Psychiatry

## 2013-02-27 DIAGNOSIS — F329 Major depressive disorder, single episode, unspecified: Secondary | ICD-10-CM

## 2013-02-27 NOTE — Progress Notes (Signed)
Patient ID: Donna Black, female   DOB: 1965-08-05, 48 y.o.   MRN: [id number]  Session Time: 9:00-9:50   Participation Level: Active   Behavioral Response: CasualAlertEuthymic   Type of Therapy: Individual Therapy   Treatment Goals addressed: emotion regulation, stress management, self-esteem   Interventions: CBT   Summary: Donna Black is a 48 y.o. female who presents with major depressive disorder.   Suicidal/Homicidal: Nowithout intent/plan   Therapist Response: Pt. Continues to present with good mood, makes appropriate eye contact, smiles and laughs appropriately. Pt. Reports that she had a good Christmas with her husband, son, daughter and son-in-law, was able to exercise appropriate self-care and not engage in excessive care-giving during the holiday season. Pt. 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 Pt. Participated in breathwork and self-esteem guided visualization.  Plan: Pt. Is to continue to work on self-esteem development through CBT, developing Scientist, research (physical sciences). Return again in 2 weeks.   Diagnosis: Axis I: Major depressive disorder   Axis II: No diagnosis   Renford Dills  02/28/2012

## 2013-03-01 ENCOUNTER — Telehealth (HOSPITAL_COMMUNITY): Payer: Self-pay | Admitting: Psychiatry

## 2013-03-01 ENCOUNTER — Telehealth (HOSPITAL_COMMUNITY): Payer: Self-pay | Admitting: *Deleted

## 2013-03-01 NOTE — Telephone Encounter (Signed)
Patient is requesting to send her prescription to [facility] because it is cheaper there.  We will send her prescription there.

## 2013-03-01 NOTE — Telephone Encounter (Signed)
Patient  Left VM:Saw Dr.Arfeen 02/26/13.Prescribed Wellbutrin &Abilify,sent to [facility].Abilify over $1000.Could Abilify RX be sent to [facility] instead? They have been cheaper in the past

## 2013-03-02 ENCOUNTER — Telehealth (HOSPITAL_COMMUNITY): Payer: Self-pay

## 2013-03-02 ENCOUNTER — Other Ambulatory Visit (HOSPITAL_COMMUNITY): Payer: Self-pay | Admitting: Psychiatry

## 2013-03-02 DIAGNOSIS — F331 Major depressive disorder, recurrent, moderate: Secondary | ICD-10-CM

## 2013-03-02 MED ORDER — ARIPIPRAZOLE 5 MG PO TABS: ORAL_TABLET | ORAL | Status: DC

## 2013-03-02 NOTE — Telephone Encounter (Signed)
Her Abilify prescription sent to [facility] as per her request.

## 2013-03-14 ENCOUNTER — Ambulatory Visit (INDEPENDENT_AMBULATORY_CARE_PROVIDER_SITE_OTHER): Payer: Commercial Managed Care - PPO | Admitting: Psychiatry

## 2013-03-14 ENCOUNTER — Encounter (HOSPITAL_COMMUNITY): Payer: Self-pay | Admitting: Psychiatry

## 2013-03-14 DIAGNOSIS — F329 Major depressive disorder, single episode, unspecified: Secondary | ICD-10-CM

## 2013-03-14 NOTE — Progress Notes (Signed)
Patient ID: Donna Black, female   DOB: [birth year], 48 y.o.   MRN: [id number]   Session Time: 12:30-1:20   Participation Level: Active   Behavioral Response: CasualAlertEuthymic   Type of Therapy: Individual Therapy   Treatment Goals addressed: emotion regulation, stress management, self-esteem   Interventions: CBT, strength-based, supportive  Summary: Donna Black is a 48 y.o. female who presents with major depressive disorder.   Suicidal/Homicidal: Nowithout intent/plan   Therapist Response: Pt. Continues to present with moderate mood, tearful, reflective. Pt. Reports pattern of husband's critical/punishing-retreating behavior. Session focused on identifying the significant traumatic events and periods of recovery during the last two years (i.e., breast cancer diagnosis, treatment/breast reconstruction, mother's death, father's illness, major episode of depression/recovery, marital conflict). Challenged Pt.'s pattern of perfectionism and striving personality.   Plan: Pt. Is to continue to work on self-esteem development through CBT, developing Scientist, research (physical sciences). Return again in 2 weeks.   Diagnosis: Axis I: Major depressive disorder   Axis II: No diagnosis   Renford Dills   03/14/2013

## 2013-03-22 ENCOUNTER — Telehealth (HOSPITAL_COMMUNITY): Payer: Self-pay | Admitting: Psychiatry

## 2013-03-22 ENCOUNTER — Telehealth (HOSPITAL_COMMUNITY): Payer: Self-pay | Admitting: *Deleted

## 2013-03-22 DIAGNOSIS — F419 Anxiety disorder, unspecified: Secondary | ICD-10-CM

## 2013-03-22 MED ORDER — HYDROXYZINE HCL 10 MG PO TABS: ORAL_TABLET | ORAL | Status: DC

## 2013-03-22 NOTE — Telephone Encounter (Signed)
I returned patient's phone call.  Patient is complaining of increased anxiety and nervousness.  She recently started working as a Psychologist, occupational.  Patient does not feel it is causing more stress but she admitted crying spells.  She denies any depression or any suicidal thoughts.  She tried Vistaril 25 mg but it made her very sleepy.  She wants to try lower dose.  I would change the prescription.  I recommended to try this to the 10 mg one to 2 tablets as needed for anxiety.  I will call in a prescription to [facility].

## 2013-03-22 NOTE — Telephone Encounter (Signed)
Pt left message: Has not had a good week. Has cried a couple of times. Has been doing well until this week. Can she go up on her Wellbutrin until she gets over this hurdle?

## 2013-03-26 ENCOUNTER — Encounter (HOSPITAL_COMMUNITY): Payer: Self-pay | Admitting: Psychiatry

## 2013-03-26 ENCOUNTER — Ambulatory Visit (INDEPENDENT_AMBULATORY_CARE_PROVIDER_SITE_OTHER): Payer: Commercial Managed Care - PPO | Admitting: Psychiatry

## 2013-03-26 VITALS — BP 136/84 | HR 96 | Ht 65.0 in | Wt 181.0 lb

## 2013-03-26 DIAGNOSIS — F329 Major depressive disorder, single episode, unspecified: Secondary | ICD-10-CM

## 2013-03-26 MED ORDER — LAMOTRIGINE 25 MG PO TABS: ORAL_TABLET | ORAL | Status: DC

## 2013-03-26 NOTE — Progress Notes (Signed)
[facility] [id number] Progress Note  Reha Martinovich [id number] 48 y.o.  03/26/2013 10:50 AM  Chief Complaint:  I am feeling more anxious and depressed.  I have a lot of negative thinking.  I get easily emotional.          History of Present Illness: Donna Black came earlier than her scheduled appointment.  She started to feel more anxious and depressed.  She admitted having trust issue on her husband and sometimes feeling paranoid about him.  She admitted more withdrawn, isolated and tearful.  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  She felt good when she was working .  She is thinking to go back to work.  Patient admitted sometime she had passive suicidal thinking but denies any plan.  She also endorsed anhedonia and some don't feel worthless but she wants to get better.  She is compliant with the medication .  Recently she called [name] and requesting to lower the dose of Vistaril because she was feeling groggy next day.  She is taking Vistaril 10 mg as needed.  Patient admitted that husband is supportive , both are going to manage counselors and also they started going together for a walk .  Patient is also seeing Victorino Dike in this office .  She denies any hallucination or any active suicidal thoughts but sometimes feels very depressed anxious and emotional.  She denies any tremors or shakes.  She cut down her drinking.  Suicidal Ideation: No Plan Formed: No Patient has means to carry out plan: No  Homicidal Ideation: No Plan Formed: No Patient has means to carry out plan: No  Review of Systems  Constitutional: Positive for malaise/fatigue.  Neurological: Positive for headaches.  Psychiatric/Behavioral: Positive for depression.   Psychiatric: Agitation: No Hallucination: No Depressed Mood: Yes Insomnia: Yes Hypersomnia: No Altered Concentration: No Feels Worthless: Yes Grandiose Ideas: No Belief In Special Powers:  No New/Increased Substance Abuse: No Compulsions: No  Neurologic: Headache: No Seizure: No Paresthesias: No  Past Medical History  Diagnosis Date  . Wears glasses   . Depression   . Cancer     bilateral breast cancer  . Heart murmur     told off and on in past that she has had one  . Grave's disease     diagnosed about 7 yrs ago but after a year she has been doing well  . Anemia     when she was younger  . Complication of anesthesia     ?, heart rate dropped w/wisdom teeth   She see Dr Johnn Hai at [facility].   Past psychiatric history. Patient has been seeing in this office since 2009.  She was admitted at [facility] due to suicidal attempt when she took overdose on her medication.  At that time she was taking Prozac which was stopped.    Social History: Patient lives with her husband.  Outpatient Encounter Prescriptions as of 03/26/2013  Medication Sig  . ARIPiprazole (ABILIFY) 5 MG tablet Take 1 and 1/2 tab daily  . buPROPion (WELLBUTRIN XL) 150 MG 24 hr tablet take 1 tablet by mouth every morning  . hydrOXYzine (ATARAX/VISTARIL) 10 MG tablet Take 1-2 tab as needed for anxiety  . lamoTRIgine (LAMICTAL) 25 MG tablet Take 1 tab daily for 1 week and than 2 tab daily   Recent Results (from the past 2160 hour(s))  CBC     Status: None  Collection Time    10/09/12  2:39 PM      Result Value Range   WBC 6.4  4.0 - 10.5 K/uL   RBC 4.25  3.87 - 5.11 MIL/uL   Hemoglobin 14.4  12.0 - 15.0 g/dL   HCT 40.5  36.0 - 46.0 %   MCV 95.3  78.0 - 100.0 fL   MCH 33.9  26.0 - 34.0 pg   MCHC 35.6  30.0 - 36.0 g/dL   RDW 12.3  11.5 - 15.5 %   Platelets 255  150 - 400 K/uL  COMPREHENSIVE METABOLIC PANEL     Status: Abnormal   Collection Time    10/09/12  2:39 PM      Result Value Range   Sodium 137  135 - 145 mEq/L   Potassium 3.2 (*) 3.5 - 5.1 mEq/L   Chloride 100  96 - 112 mEq/L   CO2 16 (*) 19 - 32 mEq/L   Glucose, Bld 91  70 - 99 mg/dL   BUN 11   6 - 23 mg/dL   Creatinine, Ser 0.61  0.50 - 1.10 mg/dL   Calcium 9.2  8.4 - 10.5 mg/dL   Total Protein 7.2  6.0 - 8.3 g/dL   Albumin 3.9  3.5 - 5.2 g/dL   AST 24  0 - 37 U/L   ALT 15  0 - 35 U/L   Alkaline Phosphatase 48  39 - 117 U/L   Total Bilirubin 0.3  0.3 - 1.2 mg/dL   GFR calc non Af Amer >90  >90 mL/min   GFR calc Af Amer >90  >90 mL/min   Comment: (NOTE)     The eGFR has been calculated using the CKD EPI equation.     This calculation has not been validated in all clinical situations.     eGFR's persistently <90 mL/min signify possible Chronic Kidney     Disease.  ACETAMINOPHEN LEVEL     Status: Abnormal   Collection Time    10/09/12  2:39 PM      Result Value Range   Acetaminophen (Tylenol), Serum 161.0 (*) 10 - 30 ug/mL   Comment: MODERATE HEMOLYSIS     HEMOLYSIS AT THIS LEVEL MAY AFFECT RESULT                THERAPEUTIC CONCENTRATIONS VARY     SIGNIFICANTLY. A RANGE OF 10-30     ug/mL MAY BE AN EFFECTIVE     CONCENTRATION FOR MANY PATIENTS.     HOWEVER, SOME ARE BEST TREATED     AT CONCENTRATIONS OUTSIDE THIS     RANGE.     ACETAMINOPHEN CONCENTRATIONS     >150 ug/mL AT 4 HOURS AFTER     INGESTION AND >50 ug/mL AT 12     HOURS AFTER INGESTION ARE     OFTEN ASSOCIATED WITH TOXIC     REACTIONS.     CRITICAL RESULT CALLED TO, READ BACK BY AND VERIFIED WITH:     JEFFRIES,T.RN $RemoveBefore'@[id number]$  1/32/4401 BY WELLS,D.  SALICYLATE LEVEL     Status: Abnormal   Collection Time    10/09/12  2:39 PM      Result Value Range   Salicylate Lvl <0.2 (*) 2.8 - 20.0 mg/dL  ETHANOL     Status: Abnormal   Collection Time    10/09/12  2:39 PM      Result Value Range   Alcohol, Ethyl (B) 55 (*) 0 - 11 mg/dL   Comment:  LOWEST DETECTABLE LIMIT FOR     SERUM ALCOHOL IS 11 mg/dL     FOR MEDICAL PURPOSES ONLY  URINE RAPID DRUG SCREEN (HOSP PERFORMED)     Status: None   Collection Time    10/09/12  3:36 PM      Result Value Range   Opiates NONE DETECTED  NONE DETECTED    Cocaine NONE DETECTED  NONE DETECTED   Benzodiazepines NONE DETECTED  NONE DETECTED   Amphetamines NONE DETECTED  NONE DETECTED   Tetrahydrocannabinol NONE DETECTED  NONE DETECTED   Barbiturates NONE DETECTED  NONE DETECTED   Comment:            DRUG SCREEN FOR MEDICAL PURPOSES     ONLY.  IF CONFIRMATION IS NEEDED     FOR ANY PURPOSE, NOTIFY LAB     WITHIN 5 DAYS.                LOWEST DETECTABLE LIMITS     FOR URINE DRUG SCREEN     Drug Class       Cutoff (ng/mL)     Amphetamine      1000     Barbiturate      200     Benzodiazepine   696     Tricyclics       789     Opiates          300     Cocaine          300     THC              50  URINALYSIS, ROUTINE W REFLEX MICROSCOPIC     Status: Abnormal   Collection Time    10/09/12  3:36 PM      Result Value Range   Color, Urine YELLOW  YELLOW   APPearance CLEAR  CLEAR   Specific Gravity, Urine >1.046 (*) 1.005 - 1.030   pH 5.0  5.0 - 8.0   Glucose, UA NEGATIVE  NEGATIVE mg/dL   Hgb urine dipstick NEGATIVE  NEGATIVE   Bilirubin Urine SMALL (*) NEGATIVE   Ketones, ur 40 (*) NEGATIVE mg/dL   Protein, ur NEGATIVE  NEGATIVE mg/dL   Urobilinogen, UA 0.2  0.0 - 1.0 mg/dL   Nitrite NEGATIVE  NEGATIVE   Leukocytes, UA NEGATIVE  NEGATIVE   Comment: MICROSCOPIC NOT DONE ON URINES WITH NEGATIVE PROTEIN, BLOOD, LEUKOCYTES, NITRITE, OR GLUCOSE <1000 mg/dL.  PROTIME-INR     Status: None   Collection Time    10/09/12  3:39 PM      Result Value Range   Prothrombin Time 12.9  11.6 - 15.2 seconds   INR 0.99  0.00 - 1.49  MRSA PCR SCREENING     Status: None   Collection Time    10/09/12  6:27 PM      Result Value Range   MRSA by PCR NEGATIVE  NEGATIVE   Comment:            The GeneXpert MRSA Assay (FDA     approved for NASAL specimens     only), is one component of a     comprehensive MRSA colonization     surveillance program. It is not     intended to diagnose MRSA     infection nor to guide or     monitor treatment for     MRSA  infections.     Performed at [facility]  LACTIC ACID, PLASMA  Status: Abnormal   Collection Time    10/09/12  6:55 PM      Result Value Range   Lactic Acid, Venous 2.5 (*) 0.5 - 2.2 mmol/L  ACETAMINOPHEN LEVEL     Status: Abnormal   Collection Time    10/09/12  6:55 PM      Result Value Range   Acetaminophen (Tylenol), Serum 136.3 (*) 10 - 30 ug/mL   Comment:            THERAPEUTIC CONCENTRATIONS VARY     SIGNIFICANTLY. A RANGE OF 10-30     ug/mL MAY BE AN EFFECTIVE     CONCENTRATION FOR MANY PATIENTS.     HOWEVER, SOME ARE BEST TREATED     AT CONCENTRATIONS OUTSIDE THIS     RANGE.     ACETAMINOPHEN CONCENTRATIONS     >150 ug/mL AT 4 HOURS AFTER     INGESTION AND >50 ug/mL AT 12     HOURS AFTER INGESTION ARE     OFTEN ASSOCIATED WITH TOXIC     REACTIONS.  HEPATIC FUNCTION PANEL     Status: Abnormal   Collection Time    10/09/12  6:55 PM      Result Value Range   Total Protein 6.5  6.0 - 8.3 g/dL   Albumin 3.1 (*) 3.5 - 5.2 g/dL   AST 17  0 - 37 U/L   ALT 14  0 - 35 U/L   Alkaline Phosphatase 40  39 - 117 U/L   Total Bilirubin 0.2 (*) 0.3 - 1.2 mg/dL   Bilirubin, Direct <0.1  0.0 - 0.3 mg/dL   Indirect Bilirubin NOT CALCULATED  0.3 - 0.9 mg/dL  COMPREHENSIVE METABOLIC PANEL     Status: Abnormal   Collection Time    10/10/12  3:40 AM      Result Value Range   Sodium 135  135 - 145 mEq/L   Potassium 3.2 (*) 3.5 - 5.1 mEq/L   Chloride 104  96 - 112 mEq/L   CO2 17 (*) 19 - 32 mEq/L   Glucose, Bld 94  70 - 99 mg/dL   BUN 7  6 - 23 mg/dL   Creatinine, Ser 0.51  0.50 - 1.10 mg/dL   Calcium 8.0 (*) 8.4 - 10.5 mg/dL   Total Protein 5.9 (*) 6.0 - 8.3 g/dL   Albumin 2.9 (*) 3.5 - 5.2 g/dL   AST 22  0 - 37 U/L   ALT 16  0 - 35 U/L   Alkaline Phosphatase 37 (*) 39 - 117 U/L   Total Bilirubin 0.4  0.3 - 1.2 mg/dL   GFR calc non Af Amer >90  >90 mL/min   GFR calc Af Amer >90  >90 mL/min   Comment: (NOTE)     The eGFR has been calculated using the CKD EPI  equation.     This calculation has not been validated in all clinical situations.     eGFR's persistently <90 mL/min signify possible Chronic Kidney     Disease.  PROTIME-INR     Status: Abnormal   Collection Time    10/10/12  3:40 AM      Result Value Range   Prothrombin Time 15.6 (*) 11.6 - 15.2 seconds   INR 1.27  0.00 - 1.49  ACETAMINOPHEN LEVEL     Status: None   Collection Time    10/10/12  3:40 AM      Result Value Range   Acetaminophen (Tylenol), Serum <15.0  10 - 30 ug/mL   Comment:            THERAPEUTIC CONCENTRATIONS VARY     SIGNIFICANTLY. A RANGE OF 10-30     ug/mL MAY BE AN EFFECTIVE     CONCENTRATION FOR MANY PATIENTS.     HOWEVER, SOME ARE BEST TREATED     AT CONCENTRATIONS OUTSIDE THIS     RANGE.     ACETAMINOPHEN CONCENTRATIONS     >150 ug/mL AT 4 HOURS AFTER     INGESTION AND >50 ug/mL AT 12     HOURS AFTER INGESTION ARE     OFTEN ASSOCIATED WITH TOXIC     REACTIONS.  CBC     Status: None   Collection Time    10/10/12  3:40 AM      Result Value Range   WBC 8.7  4.0 - 10.5 K/uL   RBC 4.11  3.87 - 5.11 MIL/uL   Hemoglobin 13.5  12.0 - 15.0 g/dL   HCT 17.9  19.9 - 57.9 %   MCV 95.9  78.0 - 100.0 fL   MCH 32.8  26.0 - 34.0 pg   MCHC 34.3  30.0 - 36.0 g/dL   RDW 00.9  20.0 - 41.5 %   Platelets 258  150 - 400 K/uL  PROTIME-INR     Status: None   Collection Time    10/10/12  3:46 PM      Result Value Range   Prothrombin Time 14.7  11.6 - 15.2 seconds   INR 1.17  0.00 - 1.49  COMPREHENSIVE METABOLIC PANEL     Status: Abnormal   Collection Time    10/10/12  3:46 PM      Result Value Range   Sodium 136  135 - 145 mEq/L   Potassium 3.9  3.5 - 5.1 mEq/L   Comment: REPEATED TO VERIFY     DELTA CHECK NOTED   Chloride 106  96 - 112 mEq/L   CO2 18 (*) 19 - 32 mEq/L   Glucose, Bld 87  70 - 99 mg/dL   BUN 5 (*) 6 - 23 mg/dL   Creatinine, Ser 9.30  0.50 - 1.10 mg/dL   Calcium 8.6  8.4 - 12.3 mg/dL   Total Protein 6.1  6.0 - 8.3 g/dL   Albumin 3.0 (*)  3.5 - 5.2 g/dL   AST 15  0 - 37 U/L   ALT 13  0 - 35 U/L   Alkaline Phosphatase 39  39 - 117 U/L   Total Bilirubin 0.3  0.3 - 1.2 mg/dL   GFR calc non Af Amer >90  >90 mL/min   GFR calc Af Amer >90  >90 mL/min   Comment: (NOTE)     The eGFR has been calculated using the CKD EPI equation.     This calculation has not been validated in all clinical situations.     eGFR's persistently <90 mL/min signify possible Chronic Kidney     Disease.  ACETAMINOPHEN LEVEL     Status: None   Collection Time    10/10/12  3:46 PM      Result Value Range   Acetaminophen (Tylenol), Serum <15.0  10 - 30 ug/mL   Comment:            THERAPEUTIC CONCENTRATIONS VARY     SIGNIFICANTLY. A RANGE OF 10-30     ug/mL MAY BE AN EFFECTIVE     CONCENTRATION FOR MANY PATIENTS.     HOWEVER,  SOME ARE BEST TREATED     AT CONCENTRATIONS OUTSIDE THIS     RANGE.     ACETAMINOPHEN CONCENTRATIONS     >150 ug/mL AT 4 HOURS AFTER     INGESTION AND >50 ug/mL AT 12     HOURS AFTER INGESTION ARE     OFTEN ASSOCIATED WITH TOXIC     REACTIONS.  COMPREHENSIVE METABOLIC PANEL     Status: Abnormal   Collection Time    10/11/12  4:20 AM      Result Value Range   Sodium 135  135 - 145 mEq/L   Potassium 3.5  3.5 - 5.1 mEq/L   Chloride 106  96 - 112 mEq/L   CO2 18 (*) 19 - 32 mEq/L   Glucose, Bld 82  70 - 99 mg/dL   BUN 5 (*) 6 - 23 mg/dL   Creatinine, Ser 9.91  0.50 - 1.10 mg/dL   Calcium 8.7  8.4 - 44.4 mg/dL   Total Protein 6.0  6.0 - 8.3 g/dL   Albumin 3.0 (*) 3.5 - 5.2 g/dL   AST 13  0 - 37 U/L   ALT 12  0 - 35 U/L   Alkaline Phosphatase 39  39 - 117 U/L   Total Bilirubin 0.1 (*) 0.3 - 1.2 mg/dL   GFR calc non Af Amer >90  >90 mL/min   GFR calc Af Amer >90  >90 mL/min   Comment: (NOTE)     The eGFR has been calculated using the CKD EPI equation.     This calculation has not been validated in all clinical situations.     eGFR's persistently <90 mL/min signify possible Chronic Kidney     Disease.  PROTIME-INR      Status: None   Collection Time    10/11/12  4:20 AM      Result Value Range   Prothrombin Time 13.5  11.6 - 15.2 seconds   INR 1.05  0.00 - 1.49    Past Psychiatric History/Hospitalization(s): Anxiety: Yes Bipolar Disorder: No Depression: Yes Mania: No Psychosis: Yes Schizophrenia: No Personality Disorder: No Hospitalization for psychiatric illness: Yes History of Electroconvulsive Shock Therapy: No Prior Suicide Attempts: Yes  Physical Exam: Constitutional:  BP 136/84  Pulse 96  Ht 5\' 5"  (1.651 m)  Wt 181 lb (82.101 kg)  BMI 30.12 kg/m2  General Appearance: well nourished.  She is emotional and tearful.  Musculoskeletal: Strength & Muscle Tone: within normal limits Gait & Station: normal Patient leans: Patient has a normal posture  Psychiatric: Speech (describe rate, volume, coherence, spontaneity, and abnormalities if any): Clear and coherent.  Thought Process (describe rate, content, abstract reasoning, and computation): Logical and goal-directed.  Associations: Coherent, Relevant and Intact  Thoughts: Preoccupied with her depressive thoughts.  Mental Status: Orientation: oriented to person, place, time/date and situation Mood & Affect: depressed affect and anxiety Attention Span & Concentration: Fair  Established Problem, Stable/Improving (1), Review of Psycho-Social Stressors (1), Established Problem, Worsening (2), Review of Last Therapy Session (1), Review of Medication Regimen & Side Effects (2) and Review of New Medication or Change in Dosage (2)  Assessment: Axis I: Maj. depressive disorder  Axis II: Deferred  Axis III: See medical history  Axis IV: Mild  Axis V: 65-70   Plan:  Patient is slowly decompensating.  She is feeling more depressed and having passive suicidal thoughts.  She denies any paranoia or any hallucination.  I will add Lamictal 25 mg and gradually increased to 50  mg in one week.  Increase Abilify 10 mg and continue Wellbutrin  at present dose.  Recommend to use Vistaril for severe anxiety.  I will see her again in 2 weeks.  If patient does not feel improvement we will consider intensive outpatient program.  Recommend to call us back if she has any question or any concern.  Time spent 25 minutes.  More than 50% of the time spent in psychoeducation, counseling and coordination of care.  Discuss safety plan that anytime having active suicidal thoughts or homicidal thoughts then patient need to call 911 or go to the local emergency room.   Eugine Bubb T., MD 03/26/2013

## 2013-04-09 ENCOUNTER — Ambulatory Visit (HOSPITAL_COMMUNITY): Payer: Self-pay | Admitting: Psychiatry

## 2013-04-16 ENCOUNTER — Encounter (HOSPITAL_COMMUNITY): Payer: Self-pay | Admitting: Psychiatry

## 2013-04-16 ENCOUNTER — Ambulatory Visit (INDEPENDENT_AMBULATORY_CARE_PROVIDER_SITE_OTHER): Payer: Commercial Managed Care - PPO | Admitting: Psychiatry

## 2013-04-16 VITALS — BP 146/88 | HR 95 | Ht 65.0 in | Wt 180.0 lb

## 2013-04-16 DIAGNOSIS — F331 Major depressive disorder, recurrent, moderate: Secondary | ICD-10-CM

## 2013-04-16 DIAGNOSIS — F329 Major depressive disorder, single episode, unspecified: Secondary | ICD-10-CM

## 2013-04-16 MED ORDER — LAMOTRIGINE 100 MG PO TABS: 100.0000 mg | ORAL_TABLET | Freq: Every day | ORAL | Status: DC

## 2013-04-16 MED ORDER — ARIPIPRAZOLE 15 MG PO TABS: ORAL_TABLET | ORAL | Status: DC

## 2013-04-16 NOTE — Progress Notes (Signed)
[place] [phone] Progress Note  Rudine Rieger [id number] 48 y.o.  04/16/2013 9:06 AM  Chief Complaint:  I don't know why people are talking about me .            History of Present Illness: Tennille came for her appointment.  She continues to have paranoia and depressive thoughts.  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  She likes working there for some time she feels that people are talking about her.  She admitted that sometimes they believe I am a slut and I don't know why they are calling me names.  She told the Lamictal was working very well until this Friday when she went to work and get very upset and emotional.  Patient also forgot to increase her Lamictal.  She continues to take 25 mg daily.  She is compliant with Wellbutrin and Lamictal.  Patient appears very tearful and emotional.  She endorsed that limit to help her crying spells however she continues to have these thoughts and believes that people does not like her.  Patient denies any suicidal thoughts or homicidal thoughts.  When I ask if she heard her name and who are the people than she replied that she never heard her name but I believe they're talking about me.  Patient also told she does not talk to her husband about her paranoid thinking because he does not understand and to start arguments.  But overall her husband is supportive.  The patient has cut down her drinking.  She denies any rash or itching to the medication.  She is scheduled to see Anderson Malta however appointment was canceled and now she scheduled to see on April 8.  She denies any agitation, anger, mood swing or any active or passive suicidal thoughts however endorse emotional, tearfulness and paranoid thinking.  She denies any tremors or shakes.  Patient is trying to lose her weight.  She has lost weight from the past.  Her appetite is okay.  Suicidal Ideation: No Plan Formed: No Patient has means to carry out plan:  No  Homicidal Ideation: No Plan Formed: No Patient has means to carry out plan: No  Review of Systems  Neurological: Positive for headaches.  Psychiatric/Behavioral: Positive for depression. The patient has insomnia.    Psychiatric: Agitation: No Hallucination: No Depressed Mood: Yes Insomnia: Yes Hypersomnia: No Altered Concentration: No Feels Worthless: Yes Grandiose Ideas: No Belief In Special Powers: No New/Increased Substance Abuse: No Compulsions: No  Neurologic: Headache: Yes Seizure: No Paresthesias: No  Past Medical History  Diagnosis Date  . Wears glasses   . Depression   . Cancer     bilateral breast cancer  . Heart murmur     told off and on in past that she has had one  . Grave's disease     diagnosed about 7 yrs ago but after a year she has been doing well  . Anemia     when she was younger  . Complication of anesthesia     ?, heart rate dropped w/wisdom teeth   She see Dr Alessandra Grout at [facility].   Social History:  Patient lives with her husband.  Outpatient Encounter Prescriptions as of 04/16/2013  Medication Sig  . ARIPiprazole (ABILIFY) 15 MG tablet Take 1 and 1/2 tab daily  . buPROPion (WELLBUTRIN XL) 150 MG 24 hr tablet take 1 tablet by mouth every morning  . hydrOXYzine (ATARAX/VISTARIL) 10 MG tablet  Take 1-2 tab as needed for anxiety  . lamoTRIgine (LAMICTAL) 100 MG tablet Take 1 tablet (100 mg total) by mouth daily.  . [DISCONTINUED] ARIPiprazole (ABILIFY) 5 MG tablet Take 1 and 1/2 tab daily  . [DISCONTINUED] lamoTRIgine (LAMICTAL) 25 MG tablet Take 1 tab daily for 1 week and than 2 tab daily   Recent Results (from the past 2160 hour(s))  CBC     Status: None   Collection Time    10/09/12  2:39 PM      Result Value Range   WBC 6.4  4.0 - 10.5 K/uL   RBC 4.25  3.87 - 5.11 MIL/uL   Hemoglobin 14.4  12.0 - 15.0 g/dL   HCT 40.5  36.0 - 46.0 %   MCV 95.3  78.0 - 100.0 fL   MCH 33.9  26.0 - 34.0 pg   MCHC 35.6  30.0 - 36.0  g/dL   RDW 12.3  11.5 - 15.5 %   Platelets 255  150 - 400 K/uL  COMPREHENSIVE METABOLIC PANEL     Status: Abnormal   Collection Time    10/09/12  2:39 PM      Result Value Range   Sodium 137  135 - 145 mEq/L   Potassium 3.2 (*) 3.5 - 5.1 mEq/L   Chloride 100  96 - 112 mEq/L   CO2 16 (*) 19 - 32 mEq/L   Glucose, Bld 91  70 - 99 mg/dL   BUN 11  6 - 23 mg/dL   Creatinine, Ser 0.61  0.50 - 1.10 mg/dL   Calcium 9.2  8.4 - 10.5 mg/dL   Total Protein 7.2  6.0 - 8.3 g/dL   Albumin 3.9  3.5 - 5.2 g/dL   AST 24  0 - 37 U/L   ALT 15  0 - 35 U/L   Alkaline Phosphatase 48  39 - 117 U/L   Total Bilirubin 0.3  0.3 - 1.2 mg/dL   GFR calc non Af Amer >90  >90 mL/min   GFR calc Af Amer >90  >90 mL/min   Comment: (NOTE)     The eGFR has been calculated using the CKD EPI equation.     This calculation has not been validated in all clinical situations.     eGFR's persistently <90 mL/min signify possible Chronic Kidney     Disease.  ACETAMINOPHEN LEVEL     Status: Abnormal   Collection Time    10/09/12  2:39 PM      Result Value Range   Acetaminophen (Tylenol), Serum 161.0 (*) 10 - 30 ug/mL   Comment: MODERATE HEMOLYSIS     HEMOLYSIS AT THIS LEVEL MAY AFFECT RESULT                THERAPEUTIC CONCENTRATIONS VARY     SIGNIFICANTLY. A RANGE OF 10-30     ug/mL MAY BE AN EFFECTIVE     CONCENTRATION FOR MANY PATIENTS.     HOWEVER, SOME ARE BEST TREATED     AT CONCENTRATIONS OUTSIDE THIS     RANGE.     ACETAMINOPHEN CONCENTRATIONS     >150 ug/mL AT 4 HOURS AFTER     INGESTION AND >50 ug/mL AT 12     HOURS AFTER INGESTION ARE     OFTEN ASSOCIATED WITH TOXIC     REACTIONS.     CRITICAL RESULT CALLED TO, READ BACK BY AND VERIFIED WITH:     JEFFRIES,T.RN _0  4/80/1655 BY WELLS,D.  SALICYLATE LEVEL  Status: Abnormal   Collection Time    10/09/12  2:39 PM      Result Value Range   Salicylate Lvl <1.2 (*) 2.8 - 20.0 mg/dL  ETHANOL     Status: Abnormal   Collection Time    10/09/12  2:39 PM       Result Value Range   Alcohol, Ethyl (B) 55 (*) 0 - 11 mg/dL   Comment:            LOWEST DETECTABLE LIMIT FOR     SERUM ALCOHOL IS 11 mg/dL     FOR MEDICAL PURPOSES ONLY  URINE RAPID DRUG SCREEN (HOSP PERFORMED)     Status: None   Collection Time    10/09/12  3:36 PM      Result Value Range   Opiates NONE DETECTED  NONE DETECTED   Cocaine NONE DETECTED  NONE DETECTED   Benzodiazepines NONE DETECTED  NONE DETECTED   Amphetamines NONE DETECTED  NONE DETECTED   Tetrahydrocannabinol NONE DETECTED  NONE DETECTED   Barbiturates NONE DETECTED  NONE DETECTED   Comment:            DRUG SCREEN FOR MEDICAL PURPOSES     ONLY.  IF CONFIRMATION IS NEEDED     FOR ANY PURPOSE, NOTIFY LAB     WITHIN 5 DAYS.                LOWEST DETECTABLE LIMITS     FOR URINE DRUG SCREEN     Drug Class       Cutoff (ng/mL)     Amphetamine      1000     Barbiturate      200     Benzodiazepine   878     Tricyclics       676     Opiates          300     Cocaine          300     THC              50  URINALYSIS, ROUTINE W REFLEX MICROSCOPIC     Status: Abnormal   Collection Time    10/09/12  3:36 PM      Result Value Range   Color, Urine YELLOW  YELLOW   APPearance CLEAR  CLEAR   Specific Gravity, Urine >1.046 (*) 1.005 - 1.030   pH 5.0  5.0 - 8.0   Glucose, UA NEGATIVE  NEGATIVE mg/dL   Hgb urine dipstick NEGATIVE  NEGATIVE   Bilirubin Urine SMALL (*) NEGATIVE   Ketones, ur 40 (*) NEGATIVE mg/dL   Protein, ur NEGATIVE  NEGATIVE mg/dL   Urobilinogen, UA 0.2  0.0 - 1.0 mg/dL   Nitrite NEGATIVE  NEGATIVE   Leukocytes, UA NEGATIVE  NEGATIVE   Comment: MICROSCOPIC NOT DONE ON URINES WITH NEGATIVE PROTEIN, BLOOD, LEUKOCYTES, NITRITE, OR GLUCOSE <1000 mg/dL.  PROTIME-INR     Status: None   Collection Time    10/09/12  3:39 PM      Result Value Range   Prothrombin Time 12.9  11.6 - 15.2 seconds   INR 0.99  0.00 - 1.49  MRSA PCR SCREENING     Status: None   Collection Time    10/09/12  6:27 PM       Result Value Range   MRSA by PCR NEGATIVE  NEGATIVE   Comment:  The GeneXpert MRSA Assay (FDA     approved for NASAL specimens     only), is one component of a     comprehensive MRSA colonization     surveillance program. It is not     intended to diagnose MRSA     infection nor to guide or     monitor treatment for     MRSA infections.     Performed at [facility]  LACTIC ACID, PLASMA     Status: Abnormal   Collection Time    10/09/12  6:55 PM      Result Value Range   Lactic Acid, Venous 2.5 (*) 0.5 - 2.2 mmol/L  ACETAMINOPHEN LEVEL     Status: Abnormal   Collection Time    10/09/12  6:55 PM      Result Value Range   Acetaminophen (Tylenol), Serum 136.3 (*) 10 - 30 ug/mL   Comment:            THERAPEUTIC CONCENTRATIONS VARY     SIGNIFICANTLY. A RANGE OF 10-30     ug/mL MAY BE AN EFFECTIVE     CONCENTRATION FOR MANY PATIENTS.     HOWEVER, SOME ARE BEST TREATED     AT CONCENTRATIONS OUTSIDE THIS     RANGE.     ACETAMINOPHEN CONCENTRATIONS     >150 ug/mL AT 4 HOURS AFTER     INGESTION AND >50 ug/mL AT 12     HOURS AFTER INGESTION ARE     OFTEN ASSOCIATED WITH TOXIC     REACTIONS.  HEPATIC FUNCTION PANEL     Status: Abnormal   Collection Time    10/09/12  6:55 PM      Result Value Range   Total Protein 6.5  6.0 - 8.3 g/dL   Albumin 3.1 (*) 3.5 - 5.2 g/dL   AST 17  0 - 37 U/L   ALT 14  0 - 35 U/L   Alkaline Phosphatase 40  39 - 117 U/L   Total Bilirubin 0.2 (*) 0.3 - 1.2 mg/dL   Bilirubin, Direct <0.1  0.0 - 0.3 mg/dL   Indirect Bilirubin NOT CALCULATED  0.3 - 0.9 mg/dL  COMPREHENSIVE METABOLIC PANEL     Status: Abnormal   Collection Time    10/10/12  3:40 AM      Result Value Range   Sodium 135  135 - 145 mEq/L   Potassium 3.2 (*) 3.5 - 5.1 mEq/L   Chloride 104  96 - 112 mEq/L   CO2 17 (*) 19 - 32 mEq/L   Glucose, Bld 94  70 - 99 mg/dL   BUN 7  6 - 23 mg/dL   Creatinine, Ser 0.51  0.50 - 1.10 mg/dL   Calcium 8.0 (*) 8.4 - 10.5 mg/dL   Total  Protein 5.9 (*) 6.0 - 8.3 g/dL   Albumin 2.9 (*) 3.5 - 5.2 g/dL   AST 22  0 - 37 U/L   ALT 16  0 - 35 U/L   Alkaline Phosphatase 37 (*) 39 - 117 U/L   Total Bilirubin 0.4  0.3 - 1.2 mg/dL   GFR calc non Af Amer >90  >90 mL/min   GFR calc Af Amer >90  >90 mL/min   Comment: (NOTE)     The eGFR has been calculated using the CKD EPI equation.     This calculation has not been validated in all clinical situations.     eGFR's persistently <90 mL/min signify possible  Chronic Kidney     Disease.  PROTIME-INR     Status: Abnormal   Collection Time    10/10/12  3:40 AM      Result Value Range   Prothrombin Time 15.6 (*) 11.6 - 15.2 seconds   INR 1.27  0.00 - 1.49  ACETAMINOPHEN LEVEL     Status: None   Collection Time    10/10/12  3:40 AM      Result Value Range   Acetaminophen (Tylenol), Serum <15.0  10 - 30 ug/mL   Comment:            THERAPEUTIC CONCENTRATIONS VARY     SIGNIFICANTLY. A RANGE OF 10-30     ug/mL MAY BE AN EFFECTIVE     CONCENTRATION FOR MANY PATIENTS.     HOWEVER, SOME ARE BEST TREATED     AT CONCENTRATIONS OUTSIDE THIS     RANGE.     ACETAMINOPHEN CONCENTRATIONS     >150 ug/mL AT 4 HOURS AFTER     INGESTION AND >50 ug/mL AT 12     HOURS AFTER INGESTION ARE     OFTEN ASSOCIATED WITH TOXIC     REACTIONS.  CBC     Status: None   Collection Time    10/10/12  3:40 AM      Result Value Range   WBC 8.7  4.0 - 10.5 K/uL   RBC 4.11  3.87 - 5.11 MIL/uL   Hemoglobin 13.5  12.0 - 15.0 g/dL   HCT 39.4  36.0 - 46.0 %   MCV 95.9  78.0 - 100.0 fL   MCH 32.8  26.0 - 34.0 pg   MCHC 34.3  30.0 - 36.0 g/dL   RDW 12.4  11.5 - 15.5 %   Platelets 258  150 - 400 K/uL  PROTIME-INR     Status: None   Collection Time    10/10/12  3:46 PM      Result Value Range   Prothrombin Time 14.7  11.6 - 15.2 seconds   INR 1.17  0.00 - 1.49  COMPREHENSIVE METABOLIC PANEL     Status: Abnormal   Collection Time    10/10/12  3:46 PM      Result Value Range   Sodium 136  135 - 145 mEq/L    Potassium 3.9  3.5 - 5.1 mEq/L   Comment: REPEATED TO VERIFY     DELTA CHECK NOTED   Chloride 106  96 - 112 mEq/L   CO2 18 (*) 19 - 32 mEq/L   Glucose, Bld 87  70 - 99 mg/dL   BUN 5 (*) 6 - 23 mg/dL   Creatinine, Ser 0.56  0.50 - 1.10 mg/dL   Calcium 8.6  8.4 - 10.5 mg/dL   Total Protein 6.1  6.0 - 8.3 g/dL   Albumin 3.0 (*) 3.5 - 5.2 g/dL   AST 15  0 - 37 U/L   ALT 13  0 - 35 U/L   Alkaline Phosphatase 39  39 - 117 U/L   Total Bilirubin 0.3  0.3 - 1.2 mg/dL   GFR calc non Af Amer >90  >90 mL/min   GFR calc Af Amer >90  >90 mL/min   Comment: (NOTE)     The eGFR has been calculated using the CKD EPI equation.     This calculation has not been validated in all clinical situations.     eGFR's persistently <90 mL/min signify possible Chronic Kidney     Disease.  ACETAMINOPHEN LEVEL     Status: None   Collection Time    10/10/12  3:46 PM      Result Value Range   Acetaminophen (Tylenol), Serum <15.0  10 - 30 ug/mL   Comment:            THERAPEUTIC CONCENTRATIONS VARY     SIGNIFICANTLY. A RANGE OF 10-30     ug/mL MAY BE AN EFFECTIVE     CONCENTRATION FOR MANY PATIENTS.     HOWEVER, SOME ARE BEST TREATED     AT CONCENTRATIONS OUTSIDE THIS     RANGE.     ACETAMINOPHEN CONCENTRATIONS     >150 ug/mL AT 4 HOURS AFTER     INGESTION AND >50 ug/mL AT 12     HOURS AFTER INGESTION ARE     OFTEN ASSOCIATED WITH TOXIC     REACTIONS.  COMPREHENSIVE METABOLIC PANEL     Status: Abnormal   Collection Time    10/11/12  4:20 AM      Result Value Range   Sodium 135  135 - 145 mEq/L   Potassium 3.5  3.5 - 5.1 mEq/L   Chloride 106  96 - 112 mEq/L   CO2 18 (*) 19 - 32 mEq/L   Glucose, Bld 82  70 - 99 mg/dL   BUN 5 (*) 6 - 23 mg/dL   Creatinine, Ser 0.59  0.50 - 1.10 mg/dL   Calcium 8.7  8.4 - 10.5 mg/dL   Total Protein 6.0  6.0 - 8.3 g/dL   Albumin 3.0 (*) 3.5 - 5.2 g/dL   AST 13  0 - 37 U/L   ALT 12  0 - 35 U/L   Alkaline Phosphatase 39  39 - 117 U/L   Total Bilirubin 0.1 (*) 0.3 - 1.2  mg/dL   GFR calc non Af Amer >90  >90 mL/min   GFR calc Af Amer >90  >90 mL/min   Comment: (NOTE)     The eGFR has been calculated using the CKD EPI equation.     This calculation has not been validated in all clinical situations.     eGFR's persistently <90 mL/min signify possible Chronic Kidney     Disease.  PROTIME-INR     Status: None   Collection Time    10/11/12  4:20 AM      Result Value Range   Prothrombin Time 13.5  11.6 - 15.2 seconds   INR 1.05  0.00 - 1.49    Past Psychiatric History/Hospitalization(s): Patient has been seeing in this office since 2009.  She was admitted at [facility] due to suicidal attempt when she took overdose on her medication.  At that time she was taking Prozac which was stopped.   Anxiety: Yes Bipolar Disorder: No Depression: Yes Mania: No Psychosis: Yes Schizophrenia: No Personality Disorder: No Hospitalization for psychiatric illness: Yes History of Electroconvulsive Shock Therapy: No Prior Suicide Attempts: Yes  Physical Exam: Constitutional:  BP 146/88  Pulse 95  Ht 5' 5" (1.651 m)  Wt 180 lb (81.647 kg)  BMI 29.95 kg/m2  General Appearance: well nourished.  She is emotional and tearful.  Musculoskeletal: Strength & Muscle Tone: within normal limits Gait & Station: normal Patient leans: Patient has a normal posture  Mental Status Examination: Patient is casually dressed and fairly groomed.  She appears very emotional and tearful.  Her speech is clear and coherent.  Her thought process is slow but logical and goal-directed .  She  endorse paranoia and delusions about people talking about her.  There were no flight of ideas or any loose association.  She maintained fair eye contact.  There were no tremors or shakes.  Her psychomotor activity is slightly decreased.  Her attention and concentration is fair.  She describes her mood is depressed anxious and her affect is constricted.  Her fund of knowledge is adequate.   She is alert and oriented x3.  Her memory is intact.  Her insight judgment and impulse control is fair.   Established Problem, Stable/Improving (1), Review of Psycho-Social Stressors (1), Established Problem, Worsening (2), Review of Last Therapy Session (1), Review of Medication Regimen & Side Effects (2) and Review of New Medication or Change in Dosage (2)  Assessment: Axis I: Maj. depressive disorder  Axis II: Deferred  Axis III: See medical history  Axis IV: Mild  Axis V: 65-70   Plan:  Patient continues to have paranoia and depressive thoughts.  Reassurance given .  I would increase her Abilify to 15 mg and Lamictal 100 mg daily.  Encourage her to take her medication as prescribed.  I offer an intensive outpatient program but patient refused.  At this time patient does not have any side effects of medication.  I will continue Wellbutrin at present dose.  In the past we have tried a higher dose of Wellbutrin but patient had side effects.  Patient is not taking Vistaril and she is actually likely Lamictal.  We will try to get an earlier apommend Anderson Malta appeared to I will see her again in 3-4 weeks.  Recommend to call us back if she has any question or any concern.  Time spent 25 minutes.  More than 50% of the time spent in psychoeducation, counseling and coordination of care.  Discuss safety plan that anytime having active suicidal thoughts or homicidal thoughts then patient need to call 911 or go to the local emergency room.   Naijah Lacek T., MD 04/16/2013

## 2013-04-17 ENCOUNTER — Telehealth (HOSPITAL_COMMUNITY): Payer: Self-pay | Admitting: *Deleted

## 2013-04-17 NOTE — Telephone Encounter (Signed)
  Pt left VM:Was in office 2/23.MD sent prescriptions in for her.Wellbutrin refills at [facility].Would like them sent to [facility] as she gets her prescriptions filled there now  Informed pt to have [facility] contact [facility] to transfer refills.If assitance required, please contact office

## 2013-05-14 ENCOUNTER — Ambulatory Visit (INDEPENDENT_AMBULATORY_CARE_PROVIDER_SITE_OTHER): Payer: Commercial Managed Care - PPO | Admitting: Psychiatry

## 2013-05-14 ENCOUNTER — Encounter (HOSPITAL_COMMUNITY): Payer: Self-pay | Admitting: Psychiatry

## 2013-05-14 VITALS — BP 153/92 | HR 88 | Ht 65.0 in | Wt 182.0 lb

## 2013-05-14 DIAGNOSIS — F331 Major depressive disorder, recurrent, moderate: Secondary | ICD-10-CM

## 2013-05-14 DIAGNOSIS — F329 Major depressive disorder, single episode, unspecified: Secondary | ICD-10-CM

## 2013-05-14 MED ORDER — ARIPIPRAZOLE 15 MG PO TABS: 15.0000 mg | ORAL_TABLET | Freq: Every day | ORAL | Status: DC

## 2013-05-14 MED ORDER — LAMOTRIGINE 100 MG PO TABS: 100.0000 mg | ORAL_TABLET | Freq: Every day | ORAL | Status: DC

## 2013-05-14 NOTE — Progress Notes (Signed)
[place] [phone] Progress Note  Zissel Biederman [id number] 48 y.o.  05/14/2013 8:43 AM  Chief Complaint:  Medication management and followup.            History of Present Illness: Donna Black came for her appointment.  Our last visit we increased her Abilify and Lamictal.  She is doing better on these medication.  She continues to struggle with her husband however her paranoia , irritability and delusions are much improved.  She also involved in social activities.  Yesterday she visited her daughter was getting married on May 16.  She is very happy about it.  She is also involved in social gathering and very happy because she is having lunch with her neighbors.  She seems depressed for marriage counseling but she still feels some time under pressure from her husband.  She denies any paranoia or any delusions.  She denies any recent episodes when she felt that people are calling her name and somebody is spying on her.  She denies any binge drinking.  She has no tremors or shakes.  She like the Lamictal which help her irritability.  She denies any recent crying spells.  She was to continue Abilify Lamictal and Wellbutrin.  She is also seeing Anderson Malta for counseling.  Her next appointment is on April 8.  There has no changes in her appetite, sleep.  She is able to sleep 6-7 hours without any interruption.  Suicidal Ideation: No Plan Formed: No Patient has means to carry out plan: No  Homicidal Ideation: No Plan Formed: No Patient has means to carry out plan: No  ROS Psychiatric: Agitation: No Hallucination: No Depressed Mood: No Insomnia: No Hypersomnia: No Altered Concentration: No Feels Worthless: No Grandiose Ideas: No Belief In Special Powers: No New/Increased Substance Abuse: No Compulsions: No  Neurologic: Headache: No Seizure: No Paresthesias: No  Past Medical History  Diagnosis Date  . Wears glasses   . Depression   . Cancer     bilateral breast cancer  .  Heart murmur     told off and on in past that she has had one  . Grave's disease     diagnosed about 7 yrs ago but after a year she has been doing well  . Anemia     when she was younger  . Complication of anesthesia     ?, heart rate dropped w/wisdom teeth   She see Dr Alessandra Grout at [facility].   Social History:  Patient lives with her husband.  Outpatient Encounter Prescriptions as of 05/14/2013  Medication Sig  . ARIPiprazole (ABILIFY) 15 MG tablet Take 1 tablet (15 mg total) by mouth daily.  Marland Kitchen buPROPion (WELLBUTRIN XL) 150 MG 24 hr tablet take 1 tablet by mouth every morning  . lamoTRIgine (LAMICTAL) 100 MG tablet Take 1 tablet (100 mg total) by mouth daily.  . [DISCONTINUED] ARIPiprazole (ABILIFY) 15 MG tablet Take 1 and 1/2 tab daily  . [DISCONTINUED] hydrOXYzine (ATARAX/VISTARIL) 10 MG tablet Take 1-2 tab as needed for anxiety  . [DISCONTINUED] lamoTRIgine (LAMICTAL) 100 MG tablet Take 1 tablet (100 mg total) by mouth daily.     Past Psychiatric History/Hospitalization(s): Patient has been seeing in this office since 2009.  She was admitted at [facility] due to suicidal attempt when she took overdose on her medication.  At that time she was taking Prozac which was stopped.   Anxiety: Yes Bipolar Disorder: No Depression: Yes Mania: No Psychosis: Yes Schizophrenia: No Personality  Disorder: No Hospitalization for psychiatric illness: Yes History of Electroconvulsive Shock Therapy: No Prior Suicide Attempts: Yes  Physical Exam: Constitutional:  BP 153/92  Pulse 88  Ht 5\' 5"  (1.651 m)  Wt 182 lb (82.555 kg)  BMI 30.29 kg/m2  General Appearance: well nourished.  She is emotional and tearful.  Musculoskeletal: Strength & Muscle Tone: within normal limits Gait & Station: normal Patient leans: Patient has a normal posture  Mental Status Examination: Patient is casually dressed and fairly groomed.  She is calm and cooperative.  She  maintains good eye contact.  Her speech is clear and coherent.  Her thought process is slow but logical and goal-directed .   she denies any paranoia, delusions or any auditory or visual hallucinations.  There were no flight of ideas or any loose association.  There were no tremors or shakes.  Her psychomotor activity is slightly decreased.  Her attention and concentration is fair.  She describes her mood is  better and her affect is improved from the past Her fund of knowledge is adequate.  She is alert and oriented x3.  Her memory is intact.  Her insight judgment and impulse control is fair.   Established Problem, Stable/Improving (1), Review of Psycho-Social Stressors (1), Review of Last Therapy Session (1) and Review of Medication Regimen & Side Effects (2)  Assessment: Axis I: Maj. depressive disorder  Axis II: Deferred  Axis III: See medical history  Axis IV: Mild  Axis V: 65-70   Plan:   patient is doing better since we increased the Abilify and adding Lamictal.  She is less depressed and less paranoid.  Her hallucinations are also less intense.  Patient does not report any side effects including EPS, tremors or any shakes.  She has no rash.  All continue her current psychotropic medication however we will consider increasing the Lamictal on her next appointment.  Encouraged her to see a marriage counselor in the basis and see Anderson Malta for individual counseling.  Followup in 4 weeks.  Aldine Chakraborty T., MD 05/14/2013

## 2013-05-25 ENCOUNTER — Ambulatory Visit (HOSPITAL_COMMUNITY): Payer: Self-pay | Admitting: Psychiatry

## 2013-05-26 ENCOUNTER — Other Ambulatory Visit (HOSPITAL_COMMUNITY): Payer: Self-pay | Admitting: Psychiatry

## 2013-05-30 ENCOUNTER — Ambulatory Visit (INDEPENDENT_AMBULATORY_CARE_PROVIDER_SITE_OTHER): Payer: Commercial Managed Care - PPO | Admitting: Psychiatry

## 2013-05-30 DIAGNOSIS — F329 Major depressive disorder, single episode, unspecified: Secondary | ICD-10-CM

## 2013-06-04 ENCOUNTER — Ambulatory Visit (HOSPITAL_COMMUNITY): Payer: Self-pay | Admitting: Psychiatry

## 2013-06-04 NOTE — Progress Notes (Signed)
   THERAPIST PROGRESS NOTE Session Time: 2:30-3:20   Participation Level: Active   Behavioral Response: CasualAlertEuthymic   Type of Therapy: Individual Therapy   Treatment Goals addressed: emotion regulation, stress management, self-esteem   Interventions: CBT, strength-based, supportive   Summary: Donna Black is a 48 y.o. female who presents with major depressive disorder.   Suicidal/Homicidal: Nowithout intent/plan   Therapist Response: Pt. Presented with bright mood, laughed and smiled appropriately. Pt. Reports that she is volunteering regularly at [employer]. Pt. Reports that she has initiated home improvement projects and is looking forward to her daughter's wedding in May. Pt. Reports that she continues to go to marriage counseling. Pt. Demonstrated ability to have clear assessment of her strengths and weaknesses, continues to make progress in development of self-esteem and reducing shame related to her most recent mental health crisis.   Plan: Pt. Is to continue to work on self-esteem development through CBT, developing Scientist, research (physical sciences). Return again in 2 weeks.   Diagnosis: Axis I: Major depressive disorder   Axis II: No diagnosis     Renford Dills 06/04/2013

## 2013-06-11 ENCOUNTER — Ambulatory Visit (INDEPENDENT_AMBULATORY_CARE_PROVIDER_SITE_OTHER): Payer: Commercial Managed Care - PPO | Admitting: Psychiatry

## 2013-06-11 ENCOUNTER — Encounter (HOSPITAL_COMMUNITY): Payer: Self-pay | Admitting: Psychiatry

## 2013-06-11 VITALS — BP 149/79 | HR 90 | Ht 65.0 in | Wt 180.4 lb

## 2013-06-11 DIAGNOSIS — F329 Major depressive disorder, single episode, unspecified: Secondary | ICD-10-CM

## 2013-06-11 DIAGNOSIS — F331 Major depressive disorder, recurrent, moderate: Secondary | ICD-10-CM

## 2013-06-11 MED ORDER — LAMOTRIGINE 100 MG PO TABS: 100.0000 mg | ORAL_TABLET | Freq: Every day | ORAL | Status: DC

## 2013-06-11 MED ORDER — BUPROPION HCL ER (XL) 150 MG PO TB24: ORAL_TABLET | ORAL | Status: DC

## 2013-06-11 MED ORDER — ARIPIPRAZOLE 15 MG PO TABS: 15.0000 mg | ORAL_TABLET | Freq: Every day | ORAL | Status: DC

## 2013-06-11 NOTE — Progress Notes (Signed)
[place] [phone] Progress Note  Donna Black [id number] 48 y.o.  06/11/2013 9:29 AM  Chief Complaint:  Medication management and followup.            History of Present Illness: Donna Black came for her appointment.  She is compliant with Lamictal Abilify and Wellbutrin.  She denies any tremors or shakes.  There are times she has some itching however there is no rash.  She is very happy because she got a job interview and is hoping to get position.  She reported improved relationship with her husband.  Her daughter is getting married and she is excited about it.  She denies irritability, anger or any depressive thoughts.  She did not recall any crying spells since the last visit the patient does not exhibit any paranoia or any hallucinations.  She was to continue her current psychotropic medication.  Her appetite sleep and wake is unchanged from the past.    Suicidal Ideation: No Plan Formed: No Patient has means to carry out plan: No  Homicidal Ideation: No Plan Formed: No Patient has means to carry out plan: No  ROS Psychiatric: Agitation: No Hallucination: No Depressed Mood: No Insomnia: No Hypersomnia: No Altered Concentration: No Feels Worthless: No Grandiose Ideas: No Belief In Special Powers: No New/Increased Substance Abuse: No Compulsions: No  Neurologic: Headache: No Seizure: No Paresthesias: No  Past Medical History  Diagnosis Date  . Wears glasses   . Depression   . Cancer     bilateral breast cancer  . Heart murmur     told off and on in past that she has had one  . Grave's disease     diagnosed about 7 yrs ago but after a year she has been doing well  . Anemia     when she was younger  . Complication of anesthesia     ?, heart rate dropped w/wisdom teeth   She see Dr Alessandra Grout at [facility].   Social History:  Patient lives with her husband.  Outpatient Encounter Prescriptions as of 06/11/2013  Medication Sig  .  ARIPiprazole (ABILIFY) 15 MG tablet Take 1 tablet (15 mg total) by mouth daily.  Marland Kitchen buPROPion (WELLBUTRIN XL) 150 MG 24 hr tablet take 1 tablet by mouth every morning  . lamoTRIgine (LAMICTAL) 100 MG tablet Take 1 tablet (100 mg total) by mouth daily.  . [DISCONTINUED] ARIPiprazole (ABILIFY) 15 MG tablet Take 1 tablet (15 mg total) by mouth daily.  . [DISCONTINUED] buPROPion (WELLBUTRIN XL) 150 MG 24 hr tablet take 1 tablet by mouth every morning  . [DISCONTINUED] lamoTRIgine (LAMICTAL) 100 MG tablet Take 1 tablet (100 mg total) by mouth daily.     Past Psychiatric History/Hospitalization(s): Patient has been seeing in this office since 2009.  She was admitted at [facility] due to suicidal attempt when she took overdose on her medication.  At that time she was taking Prozac which was stopped.   Anxiety: Yes Bipolar Disorder: No Depression: Yes Mania: No Psychosis: Yes Schizophrenia: No Personality Disorder: No Hospitalization for psychiatric illness: Yes History of Electroconvulsive Shock Therapy: No Prior Suicide Attempts: Yes  Physical Exam: Constitutional:  BP 149/79  Pulse 90  Ht 5\' 5"  (1.651 m)  Wt 180 lb 6.4 oz (81.829 kg)  BMI 30.02 kg/m2  General Appearance: well nourished.  She is emotional and tearful.  Musculoskeletal: Strength & Muscle Tone: within normal limits Gait & Station: normal Patient leans: Patient has a normal posture  Mental Status Examination: Patient is casually dressed and fairly groomed.  She is calm and cooperative.  She maintains good eye contact.  Her speech is clear and coherent.  Her thought process is slow but logical and goal-directed .   she denies any paranoia, delusions or any auditory or visual hallucinations.  There were no flight of ideas or any loose association.  There were no tremors or shakes.  Her psychomotor activity is slightly decreased.  Her attention and concentration is fair.  She describes her mood is  better  and her affect is improved from the past Her fund of knowledge is adequate.  She is alert and oriented x3.  Her memory is intact.  Her insight judgment and impulse control is fair.   Established Problem, Stable/Improving (1), Review of Psycho-Social Stressors (1), Review of Last Therapy Session (1) and Review of Medication Regimen & Side Effects (2)  Assessment: Axis I: Maj. depressive disorder  Axis II: Deferred  Axis III: See medical history  Axis IV: Mild  Axis V: 65-70   Plan:  Patient is doing better on her current psychotropic medication.  I will continue Abilify, Lamictal and Wellbutrin at present dose.  Patient does not exhibit any extrapyramidal side effects, shakes and tremors.  Recommended to see Anderson Malta for counseling.  Recommended marriage counseling.  Followup in 2 months.  Merik Mignano T., MD 06/11/2013

## 2013-06-13 ENCOUNTER — Ambulatory Visit (HOSPITAL_COMMUNITY): Payer: Self-pay | Admitting: Psychiatry

## 2013-06-27 ENCOUNTER — Ambulatory Visit (INDEPENDENT_AMBULATORY_CARE_PROVIDER_SITE_OTHER): Payer: Commercial Managed Care - PPO | Admitting: Psychiatry

## 2013-06-27 DIAGNOSIS — F329 Major depressive disorder, single episode, unspecified: Secondary | ICD-10-CM

## 2013-06-27 NOTE — Progress Notes (Signed)
    Daily Group Progress Note  Session Time: 1:30-2:30  Participation Level: Active   Behavioral Response: CasualAlertDepressed  Type of Therapy: Individual Therapy   Treatment Goals addressed: emotion regulation, stress management, self-esteem   Interventions: CBT, strength-based, supportive   Summary: Donna Black is a 48 y.o. female who presents with major depressive disorder.   Suicidal/Homicidal: Nowithout intent/plan   Therapist Response: Pt. Presented tearful, depressed. Pt. Reported fears about return of depression. Pt. Spent most of session reading segment from journal with significant themes of self-blame for problems in marriage, shame as related to past behavior, and lack of trust in marital relationship. Pt. Reported that current emotional state was triggered by nonverbal communication by a man a church who appeared to look in her direction and shake her head. Pt. Admitted that she could not discern the meaning of his non-verbals but that she internalized the communication as the man's disapproval of her. Pt. Was focused on other comments made by family and friends related to an unidentified person having short hair, a comment made to her by her father about brushing her teeth, comments suggesting that she was ashamed of her father's house, and comments made by members of her husband's family about not working, and strangers referring to her as "Miss Rettinger" instead of Mrs. Carbone. Pt. Indicated pattern of interpreting these statements as disapproval of her. Pt. Was challenged by my finding exceptions to these comments with numerous incidents discussed during our sessions of Pt.'s evidence of Pt.'s compassion, selflessness, hard work, and dedication toward her family. Discussed triggers for emotional state such as upcoming daughter's wedding next week which has left Pt. Feeling underutilized by her daughter who insisted on completing most of the wedding planning herself, feelings of  loneliness, and isolating self from friends over the past few weeks. Pt. Was scheduled for return for follow-up session Monday Jul 02, 2013.   Plan: Pt. Is to continue to work on self-esteem, coping skills, and reality testing through CBT. Return Jul 02, 2013 at 8:00 am.   Diagnosis: Axis I: Major depressive disorder   Axis II: No diagnosis    Nancie Neas, COUNS

## 2013-07-02 ENCOUNTER — Ambulatory Visit (INDEPENDENT_AMBULATORY_CARE_PROVIDER_SITE_OTHER): Payer: Commercial Managed Care - PPO | Admitting: Psychiatry

## 2013-07-02 DIAGNOSIS — F329 Major depressive disorder, single episode, unspecified: Secondary | ICD-10-CM

## 2013-07-02 NOTE — Progress Notes (Signed)
   THERAPIST PROGRESS NOTE Session Time: 8:00-9:00  Participation Level: Active   Behavioral Response: CasualAlertEuthymic   Type of Therapy: Individual Therapy   Treatment Goals addressed: emotion regulation, stress management, self-esteem   Interventions: CBT, strength-based, supportive   Summary: Donna Black is a 48 y.o. female who presents with major depressive disorder.   Suicidal/Homicidal: Nowithout intent/plan   Therapist Response: Pt. Presented with mood that was much improved from last week's session. Pt. Smiled, laughed appropriately, and made eye contact. Pt. Continues to demonstrate negative view of self and very critical of self over the last two years. For today's session Pt. Was asked to spend as much time writing her positive attributes as she had spend the previous week detailing her negative attributes. Pt. Was able to identify her ability to be encourage her son and tell him that she loves him everyday, her ability to be compassionate toward her father, and her ability to be thoughtful in her friend relationships. Pt. Expressed excitement about preparations this week for her daughter's wedding this coming weekend. Pt. Discussed concerns about developing a trusting relationship with her husband and desire to feel that he is her "defender". Most of session spend focusing on objective examples of behaviors that were positive such as being hardworking, thoughtful, compassionate, and loyal.  Plan: Pt. Is to continue to work on self-esteem, coping skills, and reality testing through CBT. Return 2-3 weeks.   Diagnosis: Axis I: Major depressive disorder   Axis II: No diagnosis     Renford Dills 07/02/2013

## 2013-08-02 ENCOUNTER — Encounter (HOSPITAL_COMMUNITY): Payer: Self-pay | Admitting: Psychiatry

## 2013-08-02 ENCOUNTER — Ambulatory Visit (INDEPENDENT_AMBULATORY_CARE_PROVIDER_SITE_OTHER): Payer: Commercial Managed Care - PPO | Admitting: Psychiatry

## 2013-08-02 VITALS — BP 150/80 | HR 96 | Ht 65.0 in | Wt 172.6 lb

## 2013-08-02 DIAGNOSIS — F331 Major depressive disorder, recurrent, moderate: Secondary | ICD-10-CM

## 2013-08-02 DIAGNOSIS — F329 Major depressive disorder, single episode, unspecified: Secondary | ICD-10-CM

## 2013-08-02 MED ORDER — CLONAZEPAM 0.5 MG PO TABS: ORAL_TABLET | ORAL | Status: DC

## 2013-08-02 MED ORDER — LAMOTRIGINE 150 MG PO TABS: 150.0000 mg | ORAL_TABLET | Freq: Every day | ORAL | Status: DC

## 2013-08-02 MED ORDER — BUPROPION HCL ER (XL) 150 MG PO TB24: ORAL_TABLET | ORAL | Status: DC

## 2013-08-02 MED ORDER — ARIPIPRAZOLE 20 MG PO TABS: 20.0000 mg | ORAL_TABLET | Freq: Every day | ORAL | Status: DC

## 2013-08-02 NOTE — Progress Notes (Signed)
[name] [phone] Progress Note  Donna Black [id number] 48 y.o.  08/02/2013 4:30 PM  Chief Complaint:  I am under a lot of stress.              History of Present Illness: Donna Black came earlier than her scheduled appointment.  She came in a crisis .  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  She felt that she got very paranoid and she should not have left the place without talking to the supervisor.  She admitted since then she is crying and she has difficulty falling asleep.  She is very concerned about her son who had asked multiple times that why she is crying and she has no answer for that.  Patient admitted lately she's been feeling more paranoid and believing people are talking about her.  She also mentioned having mistakes at work but she did not felt it was so intense that her supervisor will replace her.  Patient endorsed crying spells, depression, irritability and social isolation.  She has lot of negative symptoms and she questioned her self-confidence, ego and self-esteem.  She admitted lately drinking but now she and her husband decided not to bring any alcohol to their home.  Patient also endorsed lately she is feeling very hot, sweating and very emotional. She denies any active or passive suicidal parts or but endorses increased paranoia.  She reported no new issues with her husband.  She is compliant with Lamictal, Abilify and Wellbutrin.  She has no tremors or shakes.  She has not seen Anderson Malta for more than 4 weeks because her therapist is very busy and she does not have any appointment until next month.  Her perturbation is 150/80.  She has lost 8 pounds since her last visit.  She denies any nausea or change in her appetite.  She has no tremors or shakes.  She was happy because  her daughter got married last month and the event was without any issues.  She is very happy about her daughter.  Suicidal Ideation: No Plan Formed: No Patient has means to carry out plan: No  Homicidal Ideation: No Plan Formed: No Patient has means to carry out plan: No  ROS Psychiatric: Agitation: No Hallucination: No Depressed Mood: Yes Insomnia: Yes Hypersomnia: No Altered Concentration: No Feels Worthless: Yes Grandiose Ideas: No Belief In Special Powers: No New/Increased Substance Abuse: Yes Compulsions: No  Neurologic: Headache: No Seizure: No Paresthesias: No  Past Medical History  Diagnosis Date  . Wears glasses   . Depression   . Cancer     bilateral breast cancer  . Heart murmur     told off and on in past that she has had one  . Grave's disease     diagnosed about 7 yrs ago but after a year she has been doing well  . Anemia     when she was younger  . Complication of anesthesia     ?, heart rate dropped w/wisdom teeth   She see Dr Alessandra Grout at [facility].   Social History:  Patient lives with her husband.  Outpatient Encounter Prescriptions as of 08/02/2013  Medication Sig  . ARIPiprazole (ABILIFY) 20 MG tablet Take 1 tablet (20 mg total) by mouth daily.  Marland Kitchen buPROPion (WELLBUTRIN XL) 150  MG 24 hr tablet take 1 tablet by mouth every morning  . lamoTRIgine (LAMICTAL) 150 MG tablet Take 1 tablet (150 mg total) by mouth daily.  . [DISCONTINUED] ARIPiprazole (ABILIFY) 15 MG tablet Take 1 tablet (15 mg total) by mouth daily.  . [DISCONTINUED] buPROPion (WELLBUTRIN XL) 150 MG 24 hr tablet take 1 tablet by mouth every morning  . [DISCONTINUED] lamoTRIgine (LAMICTAL) 100 MG tablet Take 1 tablet (100 mg total) by mouth daily.  . clonazePAM (KLONOPIN) 0.5 MG tablet Take 1/2 to 1 tab as needed for severe anxiety     Past Psychiatric History/Hospitalization(s): Patient has been seeing in this office since 2009.  She was admitted at [facility] due to suicidal attempt when she took overdose on her medication.  At that time she was taking Prozac which was stopped.   Anxiety: Yes Bipolar Disorder: No Depression: Yes Mania: No Psychosis: Yes Schizophrenia: No Personality Disorder: No Hospitalization for psychiatric illness: Yes History of Electroconvulsive Shock Therapy: No Prior Suicide Attempts: Yes  Physical Exam: Constitutional:  BP 150/80  Pulse 96  Ht 5\' 5"  (1.651 m)  Wt 172 lb 9.6 oz (78.291 kg)  BMI 28.72 kg/m2  General Appearance: well nourished.  She is emotional and tearful.  Musculoskeletal: Strength & Muscle Tone: within normal limits Gait & Station: normal Patient leans: Patient has a normal posture  Mental Status Examination: Patient is casually dressed and fairly groomed.   she is very emotional, tearful and labile.  She endorse paranoia and believed people are talking about her.  Her speech is fast but clear and coherent.  Her thought process is circumstantial.  Her attention and concentration is poor.  She maintained fair eye contact.  She endorsed paranoid delusions but denies any active or passive suicidal thoughts and homicidal thoughts.  Her psychomotor activity is slightly increased.  She described her mood is depressed and her affect is constricted.  She is alert and oriented x3.  Her insight judgment and impulse control is okay.  Established Problem, Stable/Improving (1), Review of Psycho-Social Stressors (1), Established Problem, Worsening (2), New Problem, with no additional work-up planned (3), Review of Last Therapy Session (1), Review of Medication Regimen & Side Effects (2) and Review of New Medication or Change in Dosage (2)  Assessment: Axis I: Maj. depressive disorder  Axis II: Deferred  Axis III: See medical history  Axis IV: Mild  Axis V: 65-70   Plan:  I had a long discussion with the patient about her illness, medication and her stressors.  I strongly  recommended to stop drinking .  I recommend to try Abilify 20 mg and increase Lamictal 150 mg daily.  Patient is have any side effects ringing rash or itching.  I will continue Wellbutrin at present dose.  I will add low dose Klonopin 0.5 mg half to one tablet for severe panic attack and crying spells.  I also discussed to see her OB/GYN since she is noticed hot flashes and sweating .  Patient has appointment with [name] next month every week .  Recommend to keep appointment with Anderson Malta .  Recommend to contact her supervisor to ask about her status at work .  Recommend to call us back if she has any question or any concern or if she feels worse in the symptoms.  I will see her again in 3 weeks.  Time spent 25 minutes.  More than 50% of the time spent in psychoeducation, counseling and coordination of care.  Discuss safety plan that anytime having active suicidal thoughts or homicidal thoughts then patient need to call 911 or go to the local emergency room.   Sarha Bartelt T., MD 08/02/2013

## 2013-08-14 ENCOUNTER — Ambulatory Visit (HOSPITAL_COMMUNITY): Payer: Self-pay | Admitting: Psychiatry

## 2013-08-20 ENCOUNTER — Ambulatory Visit (HOSPITAL_COMMUNITY): Payer: Self-pay | Admitting: Psychiatry

## 2013-08-23 ENCOUNTER — Ambulatory Visit (HOSPITAL_COMMUNITY): Payer: Self-pay | Admitting: Psychiatry

## 2013-08-28 ENCOUNTER — Ambulatory Visit (INDEPENDENT_AMBULATORY_CARE_PROVIDER_SITE_OTHER): Payer: Commercial Managed Care - PPO | Admitting: Psychiatry

## 2013-08-28 ENCOUNTER — Encounter (HOSPITAL_COMMUNITY): Payer: Self-pay | Admitting: Psychiatry

## 2013-08-28 VITALS — BP 143/86 | HR 89 | Ht 65.0 in | Wt 179.4 lb

## 2013-08-28 DIAGNOSIS — F329 Major depressive disorder, single episode, unspecified: Secondary | ICD-10-CM

## 2013-08-28 DIAGNOSIS — F331 Major depressive disorder, recurrent, moderate: Secondary | ICD-10-CM

## 2013-08-28 MED ORDER — ARIPIPRAZOLE 20 MG PO TABS: 20.0000 mg | ORAL_TABLET | Freq: Every day | ORAL | Status: DC

## 2013-08-28 MED ORDER — LAMOTRIGINE 150 MG PO TABS: 150.0000 mg | ORAL_TABLET | Freq: Every day | ORAL | Status: DC

## 2013-08-28 MED ORDER — BUPROPION HCL ER (XL) 150 MG PO TB24: ORAL_TABLET | ORAL | Status: DC

## 2013-08-28 NOTE — Progress Notes (Signed)
[place] [phone] Progress Note  Donna Black [id number] 48 y.o.  08/28/2013 2:45 PM  Chief Complaint:  I am feeling better.                History of Present Illness: Donna Black came for her appointment.  On her last visit we've increased her Abilify and Lamictal.  She is tolerating her medication.  She denies any itching or rash.  She denies any tremors or shakes.  She is sleeping better.  She continues to have some time irritability and crying spells but they're less intense and less frequent from the past.  She is scheduled to see her OB/GYN next Thursday as patient continued to complain of hot flashes .  She is trying to keep herself busy.  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  She was to work for [employer].  She is to take 3 exams.  She recently returned from a cruise trip with her husband and her husband's family.  Patient told he had a very good time.  Patient denies any recent suicidal thoughts or any paranoia.  She feels proud that she has not taken Ativan since the last visit.  She reported her husband is supportive.  She still have question about her self confidence but overall her thinking is much improved from the past.  She denies any recent agitation, anger on any violence.  She believes her current medicine is working very well.  She wants to continue her medication at present dose.  She is not drinking or using any illegal substances.  Her appetite is okay.  Her vitals are stable.  Her weight is unchanged from the past.  She scheduled to see Anderson Malta next week.  Suicidal Ideation: No Plan Formed: No Patient has means to carry out plan: No  Homicidal Ideation: No Plan Formed: No Patient has means to carry out plan: No  ROS Psychiatric: Agitation: No Hallucination: No Depressed Mood: No Insomnia: Yes Hypersomnia: No Altered Concentration: No Feels Worthless: No Grandiose Ideas: No Belief In Special Powers: No New/Increased  Substance Abuse: No Compulsions: No  Neurologic: Headache: No Seizure: No Paresthesias: No  Past Medical History  Diagnosis Date  . Wears glasses   . Depression   . Cancer     bilateral breast cancer  . Heart murmur     told off and on in past that she has had one  . Grave's disease     diagnosed about 7 yrs ago but after a year she has been doing well  . Anemia     when she was younger  . Complication of anesthesia     ?, heart rate dropped w/wisdom teeth   She see Dr Alessandra Grout at [facility] Physician.   Social History:  Patient lives with her husband.  Outpatient Encounter Prescriptions as of 08/28/2013  Medication Sig  . ARIPiprazole (ABILIFY) 20 MG tablet Take 1 tablet (20 mg total) by mouth daily.  Marland Kitchen buPROPion (WELLBUTRIN XL) 150 MG 24 hr tablet take 1 tablet by mouth every morning  . clonazePAM (KLONOPIN) 0.5 MG tablet Take 1/2 to 1 tab as needed for severe anxiety  . lamoTRIgine (LAMICTAL) 150 MG tablet Take 1 tablet (150 mg total) by mouth daily.  . [DISCONTINUED] ARIPiprazole (ABILIFY) 20 MG tablet Take 1 tablet (20 mg total) by mouth daily.  . [DISCONTINUED] buPROPion (WELLBUTRIN XL) 150 MG 24 hr tablet take 1 tablet by mouth every morning  . [DISCONTINUED] lamoTRIgine (LAMICTAL) 150  MG tablet Take 1 tablet (150 mg total) by mouth daily.     Past Psychiatric History/Hospitalization(s): Patient has been seeing in this office since 2009.  She was admitted at [facility] due to suicidal attempt when she took overdose on her medication.  At that time she was taking Prozac which was stopped.   Anxiety: Yes Bipolar Disorder: No Depression: Yes Mania: No Psychosis: Yes Schizophrenia: No Personality Disorder: No Hospitalization for psychiatric illness: Yes History of Electroconvulsive Shock Therapy: No Prior Suicide Attempts: Yes  Physical Exam: Constitutional:  BP 143/86  Pulse 89  Ht 5\' 5"  (1.651 m)  Wt 179 lb 6.4 oz (81.375 kg)  BMI 29.85  kg/m2  General Appearance: well nourished.  She is emotional and tearful.  Musculoskeletal: Strength & Muscle Tone: within normal limits Gait & Station: normal Patient leans: Patient has a normal posture  Mental Status Examination: Patient is casually dressed and fairly groomed.   she is emotional but cooperative.  She described her mood is anxious and her affect is mood appropriate.  She denies any auditory or visual hallucination.  She denies any active or passive suicidal thoughts or homicidal thoughts.  Her thought processes logical and goal-directed.  There were no tremors or shakes.  Her fund of knowledge is adequate.  She maintained good eye contact.  She is alert and oriented x3.  Her psychomotor activity is slightly increased.  Her insight judgment and impulse control is okay.  Established Problem, Stable/Improving (1), Review of Psycho-Social Stressors (1), Established Problem, Worsening (2), New Problem, with no additional work-up planned (3), Review of Last Therapy Session (1), Review of Medication Regimen & Side Effects (2) and Review of New Medication or Change in Dosage (2)  Assessment: Axis I: Maj. depressive disorder  Axis II: Deferred  Axis III: See medical history  Axis IV: Mild  Axis V: 65-70   Plan:  Patient is doing better since Abilify and Lamictal dose is increased.  She has not any side effects.  She still has Ativan prescription which was recommended to take with severe anxiety and panic attack.  She is compliant with Wellbutrin .  I encouraged to keep appointment with a therapist and also with her OB/GYN next Thursday .  Patient will get blood work to rule out menopause .  I suggested to have a hemoglobin A1c when she gets the blood work.  Recommended to call us back if she is any question or any concern.  I will see her again in 2 months.  Time spent 25 minutes.  More than 50% of the time spent in psychoeducation, counseling and coordination of care.  Discuss  safety plan that anytime having active suicidal thoughts or homicidal thoughts then patient need to call 911 or go to the local emergency room.  Gerica Koble T., MD 08/28/2013

## 2013-09-06 ENCOUNTER — Ambulatory Visit (INDEPENDENT_AMBULATORY_CARE_PROVIDER_SITE_OTHER): Payer: Commercial Managed Care - PPO | Admitting: Psychiatry

## 2013-09-06 ENCOUNTER — Other Ambulatory Visit: Payer: Self-pay | Admitting: Family Medicine

## 2013-09-06 ENCOUNTER — Ambulatory Visit
Admission: RE | Admit: 2013-09-06 | Discharge: 2013-09-06 | Disposition: A | Discharge status: Home / Self Care | Payer: Commercial Managed Care - PPO | Source: Ambulatory Visit | Attending: Family Medicine | Admitting: Family Medicine

## 2013-09-06 DIAGNOSIS — R05 Cough: Secondary | ICD-10-CM

## 2013-09-06 DIAGNOSIS — F331 Major depressive disorder, recurrent, moderate: Secondary | ICD-10-CM

## 2013-09-06 DIAGNOSIS — R059 Cough, unspecified: Secondary | ICD-10-CM

## 2013-09-06 NOTE — Progress Notes (Signed)
   THERAPIST PROGRESS NOTE  Session Time: 3:00-3:50  Participation Level: Active   Behavioral Response: CasualAlertEuthymic   Type of Therapy: Individual Therapy   Treatment Goals addressed: emotion regulation, stress management, self-esteem   Interventions: CBT, strength-based, supportive   Summary: Daja Shuping is a 48 y.o. female who presents with major depressive disorder.   Suicidal/Homicidal: Nowithout intent/plan   Therapist Response: Pt. Presents with bright mood, talkative, makes good eye contact, talks and laughs appropriately. 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 Pt. Reports that major stressor is not feeling emotionally supported by her husband. Session focused on acknowledging her feelings and assertively communicating to others how their actions affect her.  Plan: Pt. Is to continue to work on self-esteem, coping skills, and reality testing through CBT. Return 2-3 weeks.   Diagnosis: Axis I: Major depressive disorder   Axis II: No diagnosis     Renford Dills 09/06/2013

## 2013-09-13 ENCOUNTER — Ambulatory Visit (INDEPENDENT_AMBULATORY_CARE_PROVIDER_SITE_OTHER): Payer: Commercial Managed Care - PPO | Admitting: Psychiatry

## 2013-09-13 DIAGNOSIS — F331 Major depressive disorder, recurrent, moderate: Secondary | ICD-10-CM

## 2013-09-13 NOTE — Progress Notes (Signed)
   THERAPIST PROGRESS NOTE  Session Time: 2:00-2:50   Participation Level: Active   Behavioral Response: CasualAlertEuthymic   Type of Therapy: Individual Therapy   Treatment Goals addressed: emotion regulation, stress management, self-esteem   Interventions: CBT, strength-based, supportive   Summary: [name] is a 48 y.o. female who presents with major depressive disorder.   Suicidal/Homicidal: Nowithout intent/plan   Therapist Response: Pt. Continues to present with euthymic mood. Pt. 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 Session focused on relationship history with older brother and feeling disrespected and not valued by her brother. Session also focused on acknowledging loss of personal and financial intimacy in relationship with her husband. Suggested the zimzum of love by Leonor Liv and Rexene Edison.   Plan: Pt. Is to continue to work on self-esteem, coping skills, and reality testing through CBT. Return 2-3 weeks.   Diagnosis: Axis I: Major depressive disorder   Axis II: No diagnosis     Renford Dills 09/13/2013

## 2013-09-17 ENCOUNTER — Other Ambulatory Visit (HOSPITAL_COMMUNITY): Payer: Self-pay | Admitting: Orthopaedic Surgery

## 2013-09-17 ENCOUNTER — Encounter (HOSPITAL_COMMUNITY): Payer: Self-pay | Admitting: *Deleted

## 2013-09-17 NOTE — Progress Notes (Signed)
Notified Dr. Tamala Julian (anesthesiologist) that pt is on Phentermine and has already taken today's dose. He stated that due to nature of surgery (ORIF ankle), it would not be able to be put off. Requested that a note be place on front of patient's chart. Note placed.

## 2013-09-18 ENCOUNTER — Ambulatory Visit (HOSPITAL_COMMUNITY)
Admission: RE | Admit: 2013-09-18 | Discharge: 2013-09-19 | Disposition: A | Discharge status: Home / Self Care | Payer: Commercial Managed Care - PPO | Source: Ambulatory Visit | Attending: Orthopaedic Surgery | Admitting: Orthopaedic Surgery

## 2013-09-18 ENCOUNTER — Ambulatory Visit (HOSPITAL_COMMUNITY): Payer: Commercial Managed Care - PPO | Admitting: Certified Registered Nurse Anesthetist

## 2013-09-18 ENCOUNTER — Encounter (HOSPITAL_COMMUNITY)
Admission: RE | Disposition: A | Discharge status: Home / Self Care | Payer: Self-pay | Source: Ambulatory Visit | Attending: Orthopaedic Surgery

## 2013-09-18 ENCOUNTER — Encounter (HOSPITAL_COMMUNITY): Payer: Commercial Managed Care - PPO | Admitting: Certified Registered Nurse Anesthetist

## 2013-09-18 ENCOUNTER — Ambulatory Visit (HOSPITAL_COMMUNITY): Payer: Commercial Managed Care - PPO

## 2013-09-18 ENCOUNTER — Encounter (HOSPITAL_COMMUNITY): Payer: Self-pay | Admitting: *Deleted

## 2013-09-18 DIAGNOSIS — Y9379 Activity, other specified sports and athletics: Secondary | ICD-10-CM | POA: Diagnosis not present

## 2013-09-18 DIAGNOSIS — Y9239 Other specified sports and athletic area as the place of occurrence of the external cause: Secondary | ICD-10-CM | POA: Diagnosis not present

## 2013-09-18 DIAGNOSIS — Z853 Personal history of malignant neoplasm of breast: Secondary | ICD-10-CM | POA: Diagnosis not present

## 2013-09-18 DIAGNOSIS — E0500 Thyrotoxicosis with diffuse goiter without thyrotoxic crisis or storm: Secondary | ICD-10-CM | POA: Diagnosis not present

## 2013-09-18 DIAGNOSIS — W19XXXA Unspecified fall, initial encounter: Secondary | ICD-10-CM | POA: Insufficient documentation

## 2013-09-18 DIAGNOSIS — Z87891 Personal history of nicotine dependence: Secondary | ICD-10-CM | POA: Diagnosis not present

## 2013-09-18 DIAGNOSIS — S82843A Displaced bimalleolar fracture of unspecified lower leg, initial encounter for closed fracture: Secondary | ICD-10-CM | POA: Diagnosis not present

## 2013-09-18 DIAGNOSIS — E669 Obesity, unspecified: Secondary | ICD-10-CM | POA: Diagnosis not present

## 2013-09-18 DIAGNOSIS — F3289 Other specified depressive episodes: Secondary | ICD-10-CM | POA: Diagnosis not present

## 2013-09-18 DIAGNOSIS — Y92838 Other recreation area as the place of occurrence of the external cause: Secondary | ICD-10-CM

## 2013-09-18 DIAGNOSIS — E7800 Pure hypercholesterolemia, unspecified: Secondary | ICD-10-CM | POA: Diagnosis not present

## 2013-09-18 DIAGNOSIS — Z6831 Body mass index (BMI) 31.0-31.9, adult: Secondary | ICD-10-CM | POA: Diagnosis not present

## 2013-09-18 DIAGNOSIS — F329 Major depressive disorder, single episode, unspecified: Secondary | ICD-10-CM | POA: Insufficient documentation

## 2013-09-18 DIAGNOSIS — S82841A Displaced bimalleolar fracture of right lower leg, initial encounter for closed fracture: Secondary | ICD-10-CM

## 2013-09-18 HISTORY — PX: ORIF ANKLE FRACTURE: SHX5408

## 2013-09-18 HISTORY — DX: Pure hypercholesterolemia, unspecified: E78.00

## 2013-09-18 HISTORY — DX: Pneumonia, unspecified organism: J18.9

## 2013-09-18 HISTORY — DX: Gastro-esophageal reflux disease without esophagitis: K21.9

## 2013-09-18 LAB — CBC
HCT: 40.4 % (ref 36.0–46.0)
Hemoglobin: 14 g/dL (ref 12.0–15.0)
MCH: 34.1 pg — ABNORMAL HIGH (ref 26.0–34.0)
MCHC: 34.7 g/dL (ref 30.0–36.0)
MCV: 98.3 fL (ref 78.0–100.0)
Platelets: 225 10*3/uL (ref 150–400)
RBC: 4.11 MIL/uL (ref 3.87–5.11)
RDW: 13 % (ref 11.5–15.5)
WBC: 6.6 10*3/uL (ref 4.0–10.5)

## 2013-09-18 LAB — BASIC METABOLIC PANEL
ANION GAP: 15 (ref 5–15)
BUN: 11 mg/dL (ref 6–23)
CHLORIDE: 102 meq/L (ref 96–112)
CO2: 23 mEq/L (ref 19–32)
Calcium: 9 mg/dL (ref 8.4–10.5)
Creatinine, Ser: 0.64 mg/dL (ref 0.50–1.10)
GFR calc Af Amer: >90 mL/min (ref >90)
GFR calc non Af Amer: >90 mL/min (ref >90)
Glucose, Bld: 78 mg/dL (ref 70–99)
Potassium: 3.7 mEq/L (ref 3.7–5.3)
SODIUM: 140 meq/L (ref 137–147)

## 2013-09-18 LAB — HCG, SERUM, QUALITATIVE: PREG SERUM: NEGATIVE

## 2013-09-18 SURGERY — OPEN REDUCTION INTERNAL FIXATION (ORIF) ANKLE FRACTURE
Anesthesia: General | Site: Ankle | Laterality: Right

## 2013-09-18 MED ORDER — MORPHINE SULFATE 2 MG/ML IJ SOLN: 1.0000 mg | INTRAMUSCULAR | Status: DC | PRN

## 2013-09-18 MED ORDER — CEFAZOLIN SODIUM 1-5 GM-% IV SOLN
1.0000 g | Freq: Four times a day (QID) | INTRAVENOUS | Status: AC
  Administered 2013-09-18 – 2013-09-19 (×3): 1 g via INTRAVENOUS
  Filled 2013-09-18 (×4): qty 50

## 2013-09-18 MED ORDER — OXYCODONE HCL 5 MG PO TABS
5.0000 mg | ORAL_TABLET | ORAL | Status: DC | PRN
  Administered 2013-09-19 (×2): 10 mg via ORAL
  Filled 2013-09-18 (×2): qty 2

## 2013-09-18 MED ORDER — FENTANYL CITRATE 0.05 MG/ML IJ SOLN
INTRAMUSCULAR | Status: DC | PRN
  Administered 2013-09-18: 50 ug via INTRAVENOUS
  Administered 2013-09-18: 25 ug via INTRAVENOUS
  Administered 2013-09-18: 50 ug via INTRAVENOUS

## 2013-09-18 MED ORDER — DIPHENHYDRAMINE HCL 12.5 MG/5ML PO ELIX: 12.5000 mg | ORAL_SOLUTION | ORAL | Status: DC | PRN

## 2013-09-18 MED ORDER — LACTATED RINGERS IV SOLN
INTRAVENOUS | Status: DC
  Administered 2013-09-18: 15:00:00 via INTRAVENOUS

## 2013-09-18 MED ORDER — ONDANSETRON HCL 4 MG/2ML IJ SOLN
INTRAMUSCULAR | Status: DC | PRN
  Administered 2013-09-18: 4 mg via INTRAVENOUS

## 2013-09-18 MED ORDER — MIDAZOLAM HCL 2 MG/2ML IJ SOLN
INTRAMUSCULAR | Status: AC
  Filled 2013-09-18: qty 2

## 2013-09-18 MED ORDER — MEPERIDINE HCL 25 MG/ML IJ SOLN: 6.2500 mg | INTRAMUSCULAR | Status: DC | PRN

## 2013-09-18 MED ORDER — DIPHENHYDRAMINE HCL 50 MG/ML IJ SOLN
INTRAMUSCULAR | Status: DC | PRN
  Administered 2013-09-18 (×2): 25 mg via INTRAVENOUS

## 2013-09-18 MED ORDER — SODIUM CHLORIDE 0.9 % IV SOLN
INTRAVENOUS | Status: DC
Start: 2013-09-18 — End: 2013-09-19
  Administered 2013-09-18: 22:00:00 via INTRAVENOUS

## 2013-09-18 MED ORDER — ONDANSETRON HCL 4 MG/2ML IJ SOLN: 4.0000 mg | Freq: Once | INTRAMUSCULAR | Status: DC | PRN

## 2013-09-18 MED ORDER — LAMOTRIGINE 150 MG PO TABS
150.0000 mg | ORAL_TABLET | Freq: Every day | ORAL | Status: DC
  Administered 2013-09-19: 150 mg via ORAL
  Filled 2013-09-18: qty 1

## 2013-09-18 MED ORDER — DEXAMETHASONE SODIUM PHOSPHATE 4 MG/ML IJ SOLN
INTRAMUSCULAR | Status: DC | PRN
  Administered 2013-09-18: 4 mg via INTRAVENOUS

## 2013-09-18 MED ORDER — HYDROCODONE-ACETAMINOPHEN 5-325 MG PO TABS
1.0000 | ORAL_TABLET | ORAL | Status: DC | PRN
  Administered 2013-09-18 – 2013-09-19 (×3): 2 via ORAL
  Filled 2013-09-18 (×3): qty 2

## 2013-09-18 MED ORDER — HYDROMORPHONE HCL PF 1 MG/ML IJ SOLN
0.2500 mg | INTRAMUSCULAR | Status: DC | PRN
Start: 2013-09-18 — End: 2013-09-18

## 2013-09-18 MED ORDER — 0.9 % SODIUM CHLORIDE (POUR BTL) OPTIME
TOPICAL | Status: DC | PRN
  Administered 2013-09-18: 1000 mL

## 2013-09-18 MED ORDER — METOCLOPRAMIDE HCL 10 MG PO TABS: 5.0000 mg | ORAL_TABLET | Freq: Three times a day (TID) | ORAL | Status: DC | PRN

## 2013-09-18 MED ORDER — CLINDAMYCIN PHOSPHATE 900 MG/50ML IV SOLN
900.0000 mg | INTRAVENOUS | Status: AC
  Administered 2013-09-18: 900 mg via INTRAVENOUS
  Filled 2013-09-18: qty 50

## 2013-09-18 MED ORDER — PHENTERMINE HCL 15 MG PO CAPS: 15.0000 mg | ORAL_CAPSULE | ORAL | Status: DC

## 2013-09-18 MED ORDER — OXYCODONE HCL 5 MG/5ML PO SOLN: 5.0000 mg | Freq: Once | ORAL | Status: DC | PRN

## 2013-09-18 MED ORDER — BUPIVACAINE-EPINEPHRINE (PF) 0.5% -1:200000 IJ SOLN
INTRAMUSCULAR | Status: DC | PRN
  Administered 2013-09-18: 30 mL via PERINEURAL

## 2013-09-18 MED ORDER — ONDANSETRON HCL 4 MG/2ML IJ SOLN: 4.0000 mg | Freq: Four times a day (QID) | INTRAMUSCULAR | Status: DC | PRN

## 2013-09-18 MED ORDER — METHOCARBAMOL 500 MG PO TABS
500.0000 mg | ORAL_TABLET | Freq: Four times a day (QID) | ORAL | Status: DC | PRN
  Administered 2013-09-19: 500 mg via ORAL
  Filled 2013-09-18 (×2): qty 1

## 2013-09-18 MED ORDER — DOCUSATE SODIUM 100 MG PO CAPS
100.0000 mg | ORAL_CAPSULE | Freq: Two times a day (BID) | ORAL | Status: DC
  Administered 2013-09-18 – 2013-09-19 (×2): 100 mg via ORAL
  Filled 2013-09-18 (×2): qty 1

## 2013-09-18 MED ORDER — PROPOFOL 10 MG/ML IV BOLUS
INTRAVENOUS | Status: DC | PRN
  Administered 2013-09-18: 150 mg via INTRAVENOUS

## 2013-09-18 MED ORDER — LACTATED RINGERS IV SOLN
INTRAVENOUS | Status: DC | PRN
  Administered 2013-09-18 (×2): via INTRAVENOUS

## 2013-09-18 MED ORDER — PROPOFOL 10 MG/ML IV BOLUS
INTRAVENOUS | Status: AC
  Filled 2013-09-18: qty 20

## 2013-09-18 MED ORDER — CLONAZEPAM 0.5 MG PO TABS: 0.5000 mg | ORAL_TABLET | Freq: Two times a day (BID) | ORAL | Status: DC | PRN

## 2013-09-18 MED ORDER — ZOLPIDEM TARTRATE 5 MG PO TABS: 5.0000 mg | ORAL_TABLET | Freq: Every evening | ORAL | Status: DC | PRN

## 2013-09-18 MED ORDER — FENTANYL CITRATE 0.05 MG/ML IJ SOLN
INTRAMUSCULAR | Status: AC
  Administered 2013-09-18: 100 ug
  Filled 2013-09-18: qty 2

## 2013-09-18 MED ORDER — DIPHENHYDRAMINE HCL 50 MG/ML IJ SOLN
INTRAMUSCULAR | Status: AC
  Filled 2013-09-18: qty 1

## 2013-09-18 MED ORDER — ARIPIPRAZOLE 10 MG PO TABS
20.0000 mg | ORAL_TABLET | Freq: Every day | ORAL | Status: DC
  Filled 2013-09-18 (×2): qty 2

## 2013-09-18 MED ORDER — MIDAZOLAM HCL 5 MG/5ML IJ SOLN
INTRAMUSCULAR | Status: DC | PRN
  Administered 2013-09-18: 2 mg via INTRAVENOUS

## 2013-09-18 MED ORDER — LIDOCAINE-EPINEPHRINE (PF) 1.5 %-1:200000 IJ SOLN
INTRAMUSCULAR | Status: DC | PRN
  Administered 2013-09-18: 30 mL via PERINEURAL

## 2013-09-18 MED ORDER — BUPROPION HCL ER (XL) 150 MG PO TB24
150.0000 mg | ORAL_TABLET | Freq: Every day | ORAL | Status: DC
  Administered 2013-09-19: 150 mg via ORAL
  Filled 2013-09-18 (×2): qty 1

## 2013-09-18 MED ORDER — MIDAZOLAM HCL 2 MG/2ML IJ SOLN
INTRAMUSCULAR | Status: AC
  Administered 2013-09-18: 2 mg
  Filled 2013-09-18: qty 2

## 2013-09-18 MED ORDER — ONDANSETRON HCL 4 MG/2ML IJ SOLN
INTRAMUSCULAR | Status: AC
  Filled 2013-09-18: qty 2

## 2013-09-18 MED ORDER — OXYCODONE HCL 5 MG PO TABS: 5.0000 mg | ORAL_TABLET | Freq: Once | ORAL | Status: DC | PRN

## 2013-09-18 MED ORDER — LIDOCAINE HCL (CARDIAC) 20 MG/ML IV SOLN
INTRAVENOUS | Status: DC | PRN
  Administered 2013-09-18: 100 mg via INTRAVENOUS

## 2013-09-18 MED ORDER — ONDANSETRON HCL 4 MG PO TABS: 4.0000 mg | ORAL_TABLET | Freq: Four times a day (QID) | ORAL | Status: DC | PRN

## 2013-09-18 MED ORDER — BUPIVACAINE HCL (PF) 0.25 % IJ SOLN
INTRAMUSCULAR | Status: AC
  Filled 2013-09-18: qty 30

## 2013-09-18 MED ORDER — METOCLOPRAMIDE HCL 5 MG/ML IJ SOLN: 5.0000 mg | Freq: Three times a day (TID) | INTRAMUSCULAR | Status: DC | PRN

## 2013-09-18 MED ORDER — METHOCARBAMOL 1000 MG/10ML IJ SOLN
500.0000 mg | Freq: Four times a day (QID) | INTRAMUSCULAR | Status: DC | PRN
  Filled 2013-09-18: qty 5

## 2013-09-18 MED ORDER — FENTANYL CITRATE 0.05 MG/ML IJ SOLN
INTRAMUSCULAR | Status: AC
  Filled 2013-09-18: qty 5

## 2013-09-18 SURGICAL SUPPLY — 61 items
BANDAGE ELASTIC 6 VELCRO ST LF (GAUZE/BANDAGES/DRESSINGS) ×3 IMPLANT
BANDAGE ESMARK 6X9 LF (GAUZE/BANDAGES/DRESSINGS) IMPLANT
BIT DRILL 2.7 QC CANN 155 (BIT) ×2 IMPLANT
BIT DRILL 2.7 QC CANN 155MM (BIT) ×1
BIT DRILL 3.5 QC 155 (BIT) ×2 IMPLANT
BIT DRILL 3.5 QC 155MM (BIT) ×1
BIT DRILL QC 2.7 6.3IN  SHORT (BIT) ×2
BIT DRILL QC 2.7 6.3IN SHORT (BIT) ×1 IMPLANT
BNDG ESMARK 6X9 LF (GAUZE/BANDAGES/DRESSINGS)
COVER SURGICAL LIGHT HANDLE (MISCELLANEOUS) ×3 IMPLANT
CUFF TOURNIQUET SINGLE 34IN LL (TOURNIQUET CUFF) ×3 IMPLANT
CUFF TOURNIQUET SINGLE 44IN (TOURNIQUET CUFF) IMPLANT
DRAPE C-ARM 42X72 X-RAY (DRAPES) ×3 IMPLANT
DRAPE ORTHO SPLIT 77X108 STRL (DRAPES) ×4
DRAPE PROXIMA HALF (DRAPES) ×3 IMPLANT
DRAPE SURG ORHT 6 SPLT 77X108 (DRAPES) ×2 IMPLANT
DRAPE U-SHAPE 47X51 STRL (DRAPES) ×3 IMPLANT
DURAPREP 26ML APPLICATOR (WOUND CARE) ×3 IMPLANT
ELECT REM PT RETURN 9FT ADLT (ELECTROSURGICAL) ×3
ELECTRODE REM PT RTRN 9FT ADLT (ELECTROSURGICAL) ×1 IMPLANT
GAUZE XEROFORM 5X9 LF (GAUZE/BANDAGES/DRESSINGS) ×3 IMPLANT
GLOVE BIO SURGEON STRL SZ8 (GLOVE) ×3 IMPLANT
GLOVE ORTHO TXT STRL SZ7.5 (GLOVE) ×3 IMPLANT
GOWN STRL REUS W/ TWL LRG LVL3 (GOWN DISPOSABLE) ×2 IMPLANT
GOWN STRL REUS W/ TWL XL LVL3 (GOWN DISPOSABLE) ×2 IMPLANT
GOWN STRL REUS W/TWL LRG LVL3 (GOWN DISPOSABLE) ×4
GOWN STRL REUS W/TWL XL LVL3 (GOWN DISPOSABLE) ×4
GUIDE PIN 1.3 (Pin) ×6 IMPLANT
KIT BASIN OR (CUSTOM PROCEDURE TRAY) ×3 IMPLANT
KIT ROOM TURNOVER OR (KITS) ×3 IMPLANT
MANIFOLD NEPTUNE II (INSTRUMENTS) ×3 IMPLANT
NEEDLE HYPO 25GX1X1/2 BEV (NEEDLE) IMPLANT
NS IRRIG 1000ML POUR BTL (IV SOLUTION) ×3 IMPLANT
PACK ORTHO EXTREMITY (CUSTOM PROCEDURE TRAY) ×3 IMPLANT
PAD ABD 8X10 STRL (GAUZE/BANDAGES/DRESSINGS) ×3 IMPLANT
PAD ARMBOARD 7.5X6 YLW CONV (MISCELLANEOUS) ×6 IMPLANT
PAD CAST 4YDX4 CTTN HI CHSV (CAST SUPPLIES) ×1 IMPLANT
PADDING CAST COTTON 4X4 STRL (CAST SUPPLIES) ×2
PADDING CAST COTTON 6X4 STRL (CAST SUPPLIES) ×3 IMPLANT
PIN GUIDE 1.3 (Pin) ×2 IMPLANT
PLATE TUBULAR 5 HOLE 1/3 (Plate) ×3 IMPLANT
PREFILTER NEPTUNE (MISCELLANEOUS) ×3 IMPLANT
SCREW 5.0X10 (Screw) ×3 IMPLANT
SCREW 5.0X12 (Screw) ×3 IMPLANT
SCREW CANNULATED 4.0X42 (Screw) ×6 IMPLANT
SCREW NON LOCK 3.5X12 (Screw) ×6 IMPLANT
SCREW NON LOCK 3.5X20 (Screw) ×3 IMPLANT
SCREW NONLOCK 3.5X10 (Screw) ×3 IMPLANT
SPONGE GAUZE 4X4 12PLY STER LF (GAUZE/BANDAGES/DRESSINGS) ×3 IMPLANT
SPONGE LAP 4X18 X RAY DECT (DISPOSABLE) ×6 IMPLANT
SUCTION FRAZIER TIP 10 FR DISP (SUCTIONS) ×3 IMPLANT
SUT ETHILON 2 0 FS 18 (SUTURE) ×9 IMPLANT
SUT VIC AB 2-0 CT1 27 (SUTURE) ×4
SUT VIC AB 2-0 CT1 27XBRD (SUTURE) ×2 IMPLANT
SUT VICRYL 0 CT 1 36IN (SUTURE) ×6 IMPLANT
SYR CONTROL 10ML LL (SYRINGE) IMPLANT
TOWEL OR 17X24 6PK STRL BLUE (TOWEL DISPOSABLE) ×3 IMPLANT
TOWEL OR 17X26 10 PK STRL BLUE (TOWEL DISPOSABLE) ×3 IMPLANT
TUBE CONNECTING 12'X1/4 (SUCTIONS) ×1
TUBE CONNECTING 12X1/4 (SUCTIONS) ×2 IMPLANT
WATER STERILE IRR 1000ML POUR (IV SOLUTION) ×3 IMPLANT

## 2013-09-18 NOTE — Anesthesia Postprocedure Evaluation (Signed)
  Anesthesia Post-op Note  Patient: Donna Black  Procedure(s) Performed: Procedure(s): OPEN REDUCTION INTERNAL FIXATION (ORIF) RIGHT ANKLE FRACTURE (Right)  Patient Location: PACU  Anesthesia Type:General and GA combined with regional for post-op pain  Level of Consciousness: awake, alert  and oriented  Airway and Oxygen Therapy: Patient Spontanous Breathing and Patient connected to nasal cannula oxygen  Post-op Pain: none  Post-op Assessment: Post-op Vital signs reviewed, Patient's Cardiovascular Status Stable, Respiratory Function Stable, Patent Airway and Pain level controlled  Post-op Vital Signs: stable  Last Vitals:  Filed Vitals:   09/18/13 2010  BP: 127/64  Pulse: 78  Temp: 36.8 C  Resp: 16    Complications: No apparent anesthesia complications

## 2013-09-18 NOTE — Anesthesia Procedure Notes (Addendum)
Anesthesia Regional Block:  Popliteal block  Pre-Anesthetic Checklist: ,, timeout performed, Correct Patient, Correct Site, Correct Laterality, Correct Procedure, Correct Position, site marked, Risks and benefits discussed,  Surgical consent,  Pre-op evaluation,  At surgeon's request and post-op pain management  Laterality: Right  Prep: chloraprep       Needles:  Injection technique: Single-shot  Needle Type: Echogenic Stimulator Needle     Needle Length: 10cm 10 cm Needle Gauge: 21 and 21 G    Additional Needles:  Procedures: ultrasound guided (picture in chart) and nerve stimulator Popliteal block  Nerve Stimulator or Paresthesia:  Response: 0.4 mA,   Additional Responses:   Narrative:  Start time: 09/18/2013 3:15 PM End time: 09/18/2013 3:30 PM Injection made incrementally with aspirations every 5 mL.  Performed by: Personally  Anesthesiologist: Lillia Abed MD  Additional Notes: Monitors applied. Patient sedated. Sterile prep and drape,hand hygiene and sterile gloves were used. Relevant anatomy identified.Needle position confirmed.Local anesthetic injected incrementally after negative aspiration. Local anesthetic spread visualized around nerve(s). Vascular puncture avoided. No complications. Image printed for medical record.The patient tolerated the procedure well.  Additional Saphenous nerve block performed. 15cc Local Anesthetic mixture placed under ultrasonic guidance along the medio-inferior border of the Sartorious muscle 6 inches above the knee.  No Problems encountered.  Lillia Abed MD

## 2013-09-18 NOTE — Anesthesia Preprocedure Evaluation (Addendum)
Anesthesia Evaluation  Patient identified by MRN, date of birth, ID band Patient awake and Patient confused    Reviewed: Allergy & Precautions, H&P , NPO status , Patient's Chart, lab work & pertinent test results  Airway Mallampati: I TM Distance: >3 FB Neck ROM: Full    Dental  (+) Teeth Intact   Pulmonary former smoker,  breath sounds clear to auscultation        Cardiovascular negative cardio ROS  Rhythm:Regular Rate:Normal     Neuro/Psych    GI/Hepatic   Endo/Other  Hyperthyroidism   Renal/GU      Musculoskeletal   Abdominal (+) + obese,  Abdomen: soft.    Peds  Hematology   Anesthesia Other Findings   Reproductive/Obstetrics                          Anesthesia Physical Anesthesia Plan  ASA: I  Anesthesia Plan: General   Post-op Pain Management:    Induction: Intravenous  Airway Management Planned: LMA  Additional Equipment:   Intra-op Plan:   Post-operative Plan: Extubation in OR  Informed Consent:   Dental advisory given  Plan Discussed with: Anesthesiologist and Surgeon  Anesthesia Plan Comments:         Anesthesia Quick Evaluation

## 2013-09-18 NOTE — Transfer of Care (Signed)
Immediate Anesthesia Transfer of Care Note  Patient: Donna Black  Procedure(s) Performed: Procedure(s): OPEN REDUCTION INTERNAL FIXATION (ORIF) RIGHT ANKLE FRACTURE (Right)  Patient Location: PACU  Anesthesia Type:General  Level of Consciousness: awake, alert  and oriented  Airway & Oxygen Therapy: Patient Spontanous Breathing  Post-op Assessment: Report given to PACU RN  Post vital signs: Reviewed and stable  Complications: No apparent anesthesia complications

## 2013-09-18 NOTE — Brief Op Note (Signed)
09/18/2013  6:10 PM  PATIENT:  Donna Black  48 y.o. female  PRE-OPERATIVE DIAGNOSIS:  Right bimalleolar ankle fracture  POST-OPERATIVE DIAGNOSIS:  Right bimalleolar ankle fracture  PROCEDURE:  Procedure(s): OPEN REDUCTION INTERNAL FIXATION (ORIF) RIGHT ANKLE FRACTURE (Right)  SURGEON:  Surgeon(s) and Role:    * Mcarthur Rossetti, MD - Primary  PHYSICIAN ASSISTANT: Benita Stabile, PA-C  ANESTHESIA:   general and regional  EBL:  Total I/O In: 1000 [I.V.:1000] Out: -   BLOOD ADMINISTERED:none  DRAINS: none   LOCAL MEDICATIONS USED:  NONE  SPECIMEN:  No Specimen  DISPOSITION OF SPECIMEN:  N/A  COUNTS:  YES  TOURNIQUET:   Total Tourniquet Time Documented: Thigh (Right) - 81 minutes Total: Thigh (Right) - 81 minutes   DICTATION: .Other Dictation: Dictation Number [id number]  PLAN OF CARE: Admit for overnight observation  PATIENT DISPOSITION:  PACU - hemodynamically stable.   Delay start of Pharmacological VTE agent (>24hrs) due to surgical blood loss or risk of bleeding: no

## 2013-09-18 NOTE — H&P (Signed)
Donna Black is an 48 y.o. female.   Chief Complaint:   Right ankle pain; known unstable fracture HPI:   48 yo female who fractured her right ankle this pat weekend in [place] as the result of an accidental fall at a ropes course.  Was seen at an outside hospital and appropriately splinted for this injury.  She understands fully that her right ankle has an unstable bimalleolar fracture and that surgery is recommended.  The risks include nerve and vessel injury, infection, DVT, and non-union.  Past Medical History  Diagnosis Date  . Wears glasses   . Depression   . Heart murmur     told off and on in past that she has had one  . Grave's disease     diagnosed about 7 yrs ago but after a year she has been doing well  . Anemia     when she was younger  . Complication of anesthesia     ?, heart rate dropped w/wisdom teeth  . Cancer 2013    bilateral breast cancer  . Pneumonia   . GERD (gastroesophageal reflux disease)     with pregnancy only  . High cholesterol     Past Surgical History  Procedure Laterality Date  . Cholecystectomy    . Wisdom tooth extraction    . Breast fibroadenoma surgery  1987, 2000    bilateral  . Breast reconstruction  05/12/2011    Procedure: BREAST RECONSTRUCTION;  Surgeon: Crissie Reese, MD;  Location: [facility];  Service: Plastics;  Laterality: Bilateral;  PLACEMENT OF BILATERAL TISSUE EXPANDERS WITH POSSIBLE USE OF HD FLEX  . Cesarean section      2001,cyst; 2002 birth    Family History  Problem Relation Age of Onset  . Breast cancer Maternal Grandmother   . Cancer Maternal Grandmother     breast  . Heart disease Mother   . Stroke Mother   . COPD Mother   . Diabetes Mother   . Heart disease Father    Social History:  reports that she quit smoking 2 days ago. Her smoking use included Cigarettes. She smoked 1.00 pack per day. She has never used smokeless tobacco. She reports that she drinks about 1.2 ounces of alcohol per week. She reports that she  does not use illicit drugs.  Allergies:  Allergies  Allergen Reactions  . Penicillins Other (See Comments)    Did not work as a child    No prescriptions prior to admission    No results found for this or any previous visit (from the past 68 hour(s)). No results found.  Review of Systems  All other systems reviewed and are negative.   Last menstrual period 08/06/2013. Physical Exam  Constitutional: She is oriented to person, place, and time. She appears well-developed and well-nourished.  HENT:  Head: Normocephalic and atraumatic.  Eyes: EOM are normal. Pupils are equal, round, and reactive to light.  Neck: Normal range of motion. Neck supple.  Cardiovascular: Normal rate and regular rhythm.   Respiratory: Effort normal and breath sounds normal.  GI: Soft. Bowel sounds are normal.  Musculoskeletal:       Right ankle: She exhibits swelling, ecchymosis and deformity. Tenderness. Lateral malleolus and medial malleolus tenderness found.  Neurological: She is alert and oriented to person, place, and time.  Skin: Skin is warm and dry.  Psychiatric: She has a normal mood and affect.     Assessment/Plan Right unstable bimalleolar ankle fracture 1)  To the OR today  for open reduction/internal fixation of her right ankle lateral and medial malleolus, then admission overnight for antibiotics, pain control, and therapy  Elison Worrel Y 09/18/2013, 12:34 PM

## 2013-09-19 ENCOUNTER — Encounter (HOSPITAL_COMMUNITY): Payer: Self-pay | Admitting: Orthopaedic Surgery

## 2013-09-19 ENCOUNTER — Ambulatory Visit (HOSPITAL_COMMUNITY): Payer: Self-pay | Admitting: Psychiatry

## 2013-09-19 DIAGNOSIS — S82843A Displaced bimalleolar fracture of unspecified lower leg, initial encounter for closed fracture: Secondary | ICD-10-CM | POA: Diagnosis not present

## 2013-09-19 MED ORDER — ONDANSETRON HCL 4 MG PO TABS: 4.0000 mg | ORAL_TABLET | Freq: Three times a day (TID) | ORAL | Status: DC | PRN

## 2013-09-19 MED ORDER — OXYCODONE-ACETAMINOPHEN 5-325 MG PO TABS: 1.0000 | ORAL_TABLET | ORAL | Status: DC | PRN

## 2013-09-19 MED ORDER — ASPIRIN EC 325 MG PO TBEC: 325.0000 mg | DELAYED_RELEASE_TABLET | Freq: Two times a day (BID) | ORAL | Status: DC

## 2013-09-19 MED ORDER — METHOCARBAMOL 500 MG PO TABS
500.0000 mg | ORAL_TABLET | Freq: Four times a day (QID) | ORAL | Status: DC | PRN
Start: 2013-09-19 — End: 2014-04-04

## 2013-09-19 NOTE — Discharge Summary (Signed)
Patient ID: Donna Black MRN: [id number] DOB/AGE: May 03, 1965 48 y.o.  Admit date: 09/18/2013 Discharge date: 09/19/2013  Admission Diagnoses:  Principal Problem:   Closed bimalleolar fracture of right ankle Active Problems:   Bimalleolar fracture   Discharge Diagnoses:  Same  Past Medical History  Diagnosis Date  . Wears glasses   . Depression   . Heart murmur     told off and on in past that she has had one  . Grave's disease     diagnosed about 7 yrs ago but after a year she has been doing well  . Anemia     when she was younger  . Complication of anesthesia     ?, heart rate dropped w/wisdom teeth  . Cancer 2013    bilateral breast cancer  . Pneumonia   . GERD (gastroesophageal reflux disease)     with pregnancy only  . High cholesterol     Surgeries: Procedure(s): OPEN REDUCTION INTERNAL FIXATION (ORIF) RIGHT ANKLE FRACTURE on 09/18/2013   Consultants:    Discharged Condition: Improved  Hospital Course: Donna Black is an 48 y.o. female who was admitted 09/18/2013 for operative treatment ofClosed bimalleolar fracture of right ankle. Patient has severe unremitting pain that affects sleep, daily activities, and work/hobbies. After pre-op clearance the patient was taken to the operating room on 09/18/2013 and underwent  Procedure(s): OPEN REDUCTION INTERNAL FIXATION (ORIF) RIGHT ANKLE FRACTURE.    Patient was given perioperative antibiotics: Anti-infectives   Start     Dose/Rate Route Frequency Ordered Stop   09/19/13 0600  clindamycin (CLEOCIN) IVPB 900 mg     900 mg 100 mL/hr over 30 Minutes Intravenous On call to O.R. 09/18/13 1407 09/18/13 1630   09/18/13 2200  ceFAZolin (ANCEF) IVPB 1 g/50 mL premix     1 g 100 mL/hr over 30 Minutes Intravenous Every 6 hours 09/18/13 2017 09/19/13 1559       Patient was given sequential compression devices, early ambulation, and chemoprophylaxis to prevent DVT.  Patient benefited maximally from hospital stay and  there were no complications.    Recent vital signs: Patient Vitals for the past 24 hrs:  BP Temp Temp src Pulse Resp SpO2 Height Weight  09/19/13 0438 114/58 mmHg 97.9 F (36.6 C) Oral 70 16 99 % - -  09/19/13 0009 107/55 mmHg 98.1 F (36.7 C) Oral 66 16 97 % - -  09/18/13 2010 127/64 mmHg 98.3 F (36.8 C) Oral 78 16 98 % 5\' 5"  (1.651 m) 87 kg (191 lb 12.8 oz)  09/18/13 1930 147/74 mmHg 98 F (36.7 C) - 82 16 100 % - -  09/18/13 1915 140/74 mmHg - - 82 15 100 % - -  09/18/13 1900 158/66 mmHg - - 85 19 100 % - -  09/18/13 1845 142/68 mmHg - - 90 16 100 % - -  09/18/13 1840 152/92 mmHg 97.7 F (36.5 C) - 94 17 100 % - -  09/18/13 1535 134/55 mmHg - - 80 19 100 % - -  09/18/13 1530 134/56 mmHg - - 80 16 100 % - -  09/18/13 1525 130/57 mmHg - - 79 19 100 % - -  09/18/13 1520 135/51 mmHg - - 78 17 100 % - -  09/18/13 1515 153/62 mmHg - - 81 13 100 % - -  09/18/13 1510 146/63 mmHg - - 81 17 100 % - -  09/18/13 1423 - 97.9 F (36.6 C) Oral 79 18 100 % 5\' 5"  (1.651  m) 81.194 kg (179 lb)     Recent laboratory studies:  Recent Labs  09/18/13 1400 09/18/13 1408  WBC  --  6.6  HGB  --  14.0  HCT  --  40.4  PLT  --  225  NA 140  --   K 3.7  --   CL 102  --   CO2 23  --   BUN 11  --   CREATININE 0.64  --   GLUCOSE 78  --   CALCIUM 9.0  --      Discharge Medications:     Medication List         ARIPiprazole 20 MG tablet  Commonly known as:  ABILIFY  Take 20 mg by mouth at bedtime.     aspirin EC 325 MG tablet  Take 1 tablet (325 mg total) by mouth 2 (two) times daily after a meal.     buPROPion 150 MG 24 hr tablet  Commonly known as:  WELLBUTRIN XL  take 1 tablet by mouth every morning     clonazePAM 0.5 MG tablet  Commonly known as:  KLONOPIN  Take 1/2 to 1 tab as needed for severe anxiety     lamoTRIgine 150 MG tablet  Commonly known as:  LAMICTAL  Take 1 tablet (150 mg total) by mouth daily.     methocarbamol 500 MG tablet  Commonly known as:  ROBAXIN   Take 1 tablet (500 mg total) by mouth every 6 (six) hours as needed for muscle spasms.     ondansetron 4 MG tablet  Commonly known as:  ZOFRAN  Take 1 tablet (4 mg total) by mouth every 8 (eight) hours as needed for nausea or vomiting.     oxyCODONE-acetaminophen 5-325 MG per tablet  Commonly known as:  PERCOCET/ROXICET  Take 1-2 tablets by mouth every 4 (four) hours as needed for severe pain.     phentermine 15 MG capsule  Take 15 mg by mouth every morning.        Diagnostic Studies: Dg Chest 2 View  09/06/2013   CLINICAL DATA:  Cough for year, short of breath, history of breast carcinoma and smoking history  EXAM: CHEST  2 VIEW  COMPARISON:  Chest x-ray of 05/03/2011  FINDINGS: No active infiltrate or effusion is seen. Mediastinal and hilar contours appear unchanged. The heart is within normal limits in size. Breast implants are present. No bony abnormality is noted.  IMPRESSION: No active cardiopulmonary disease.   Electronically Signed   By: Ivar Drape M.D.   On: 09/06/2013 14:24   Dg Ankle Complete Right  09/18/2013   CLINICAL DATA:  . reduction internal fixation right ankle.  EXAM: RIGHT ANKLE - COMPLETE 3+ VIEW  COMPARISON:  09/18/2013.  FINDINGS: . reduction internal fixation of bimalleolar fractures with good anatomic alignment on AP view.  IMPRESSION: Open reduction internal fixation of bimalleolar fractures with good anatomic alignment on AP view.   Electronically Signed   By: Marcello Moores  Register   On: 09/18/2013 21:31   Dg C-arm 61-120 Min  09/18/2013   CLINICAL DATA:  . reduction internal fixation right ankle.  EXAM: DG C-ARM 61-120 MIN  TECHNIQUE: Digital AP spot image obtained.  CONTRAST:  None.  FLUOROSCOPY TIME:  0 min 48 seconds.  COMPARISON:  None.  FINDINGS: Patient status post open reduction internal fixation of bimalleolar fractures with good anatomic alignment on AP view.  IMPRESSION: ORIF bimalleolar fractures right ankle. Good anatomic alignment on AP view.  Electronically Signed   By: Marcello Moores  Register   On: 09/18/2013 21:32    Disposition: 01-Home or Self Care      Discharge Instructions   Call MD / Call 911    Complete by:  As directed   If you experience chest pain or shortness of breath, CALL 911 and be transported to the hospital emergency room.  If you develope a fever above 101 F, pus (white drainage) or increased drainage or redness at the wound, or calf pain, call your surgeon's office.     Constipation Prevention    Complete by:  As directed   Drink plenty of fluids.  Prune juice may be helpful.  You may use a stool softener, such as Colace (over the counter) 100 mg twice a day.  Use MiraLax (over the counter) for constipation as needed.     Diet - low sodium heart healthy    Complete by:  As directed      Discharge instructions    Complete by:  As directed   Elevation of right leg for swelling. Keep your dressings/splint clean and dry. No weight on your right ankle until further notice.     Discharge patient    Complete by:  As directed      Increase activity slowly as tolerated    Complete by:  As directed            Follow-up Information   Follow up with Mcarthur Rossetti, MD In 2 weeks.   Specialty:  Orthopedic Surgery   Contact information:   [place] [address] [phone]        Signed: Mcarthur Rossetti 09/19/2013, 7:30 AM

## 2013-09-19 NOTE — Op Note (Signed)
NAMEKARLA, Donna Black             ACCOUNT NO.:  [id number]  MEDICAL RECORD NO.:  [id number]  LOCATION:  [id number]                        FACILITY:  [facility]  PHYSICIAN:  Lind Guest. Ninfa Linden, M.D.DATE OF BIRTH:  06-21-1965  DATE OF PROCEDURE:  09/18/2013 DATE OF DISCHARGE:                              OPERATIVE REPORT   PREOPERATIVE DIAGNOSIS:  Right ankle unstable bimalleolar fracture.  POSTOPERATIVE DIAGNOSIS:  Right ankle unstable bimalleolar fracture.  PROCEDURE:  Open reduction and internal fixation of right bimalleolar ankle fracture with fixation of the lateral malleolus and the medial malleolus.  IMPLANTS:  [employer], 5-hole 1/3rd semitubular plate over the lateral malleolus and a single 4.0 cannulated screw medially.  SURGEON:  Lind Guest. Ninfa Linden, M.D.  ASSISTANT:  Erskine Emery, P.A.C.  ANESTHESIA: 1. Regional, right lower extremity block. 2. General.  TOURNIQUET TIME:  Less than 2 hours.  COMPLICATIONS:  None.  INDICATIONS:  Ms. Espericueta is a 48 year old female who this past weekend was at the [place]water rafting course and [place] was performing the ropes' course aspect with her husband.  She had a mechanical fall, injuring her right ankle.  She was seen in the emergency room in [place] and found to have a bimalleolar ankle fracture with some just slight subluxation.  She was placed __________ in a splint and followed up in [place] where she is from.  X-rays did show a displaced right ankle bimalleolar fracture with some slight subluxation of the ankle joint with flattening of the mortise.  We recommended she undergo an open reduction and internal fixation.  She is not a diabetic.  She is an active individual and understands the risks of acute blood loss anemia, nerve and vessel injury, and in fact as well as nonunion displacement of the fracture.  She understands she will need to be non weightbearing for potentially up to 4-6 weeks depending on  the fracture stability.  She understands the __________ fracture reduction and healing on to hopefully normal ambulation.  There is a high risk of posttraumatic arthritis as well.  DESCRIPTION:  After informed consent was obtained, appropriate right leg was marked.  Anesthesia was obtained with regional block.  She was brought to the operating room, placed supine on the operating table. General anesthesia was then obtained.  A nonsterile tourniquet was placed around the upper right leg and a bump underneath her right hip to internally rotate her ankle.  Her right foot and ankle were then prepped and draped with DuraPrep and sterile drapes.  Time-out was called and she was identified as correct patient, correct right ankle.  We then used an Esmarch to wrap out the leg and the tourniquet was inflated to 300 mmHg.  I made an incision directly over the fibula laterally and carried this proximally and distally.  We dissected down the fracture site and was able to use reduction forceps to anatomically reduce the fibula.  This was under direct visualization under fluoroscopy.  I tried to place a lag screw, but was unsuccessful getting the angle of the screw to hold as a true lag screw.  I then placed a 1/3rd semitubular plate from [employer] which was a 5-hole plate in an antiglide  position of the posterior fibula.  This was __________ screw proximally and two big cancellous screws distally.  I then went to the medial side. I made a small incision of the medial malleolus and was able to anatomically reduce this from the medial side under direct visualization and fluoroscopy.  It was a very small piece, I could only put 1 pin for a cannulated screw.  I was able to hold the fracture, anatomically reduce, and then place a single partially threaded cannulated screws without difficulty.  We then cleaned both wounds and irrigated them out thoroughly with normal saline solution.  Of note, when  the medial __________ was open, I did irrigate the ankle joint as well.  We then closed the deep tissue over the hardware with 0 Vicryl suture followed by 2-0 Vicryl and subcutaneous tissue interrupted 2-0 nylon skin. Xeroform, well padded sterile dressing, a posterior splint was applied and Steri-Strips was applied as well.  Tourniquet was let down and her toes were pink and nice.  She was awakened and extubated and taken to recovery room in stable condition.  All final counts were correct. There were no complications noted.  Of note, Erskine Emery, PA-C was instrumental in helping this case with helping maintaining the reduction, helping with the instrumentation, and retracted the wound as well as layered closure.     Lind Guest. Ninfa Linden, M.D.     CYB/MEDQ  D:  09/18/2013  T:  09/19/2013  Job:  [id number]

## 2013-09-19 NOTE — Evaluation (Signed)
Physical Therapy Evaluation Patient Details Name: Donna Black MRN: [id number] DOB: [birth year] Today's Date: 09/19/2013   History of Present Illness  pt presents with R Ankle Fx s/p ORIF.    Clinical Impression  Pt very motivated and performed mobility safely with RW.  Pt states she has been scared using crutches at home and feels more stable with RW.  Pt will have good support from family and is ready for D/C from PT stand point.      Follow Up Recommendations No PT follow up;Supervision for mobility/OOB    Equipment Recommendations  Rolling walker with 5" wheels    Recommendations for Other Services       Precautions / Restrictions Precautions Precautions: None Restrictions Weight Bearing Restrictions: Yes RLE Weight Bearing: Non weight bearing      Mobility  Bed Mobility Overal bed mobility: Needs Assistance Bed Mobility: Supine to Sit     Supine to sit: Min assist     General bed mobility comments: A with bringing R LE OOB only.    Transfers Overall transfer level: Needs assistance Equipment used: Rolling walker (2 wheeled) Transfers: Sit to/from Stand Sit to Stand: Min guard         General transfer comment: cues for UE use and controlling descent to sitting.    Ambulation/Gait Ambulation/Gait assistance: Min guard Ambulation Distance (Feet): 100 Feet Assistive device: Rolling walker (2 wheeled) Gait Pattern/deviations: Step-to pattern     General Gait Details: cues for positioning within RW, upright posture.    Stairs Stairs: Yes Stairs assistance: Min assist Stair Management: No rails;Step to pattern;Backwards;With walker Number of Stairs: 2 (and 1) General stair comments: pt practiced single curb step as she would for home entry and then trialed 2 stairs with RW and spouse A.  Together pt and spouse are Mod I.  Mudlogger.    Wheelchair Mobility    Modified Rankin (Stroke Patients Only)       Balance Overall balance  assessment: Needs assistance         Standing balance support: Single extremity supported Standing balance-Leahy Scale: Poor Standing balance comment: pt needs at least single UE support unless she is able to lean again counter/sink for balance.                               Pertinent Vitals/Pain 6/10 with LE in dependent positioning.  Premedicated.      Home Living Family/patient expects to be discharged to:: Private residence Living Arrangements: Spouse/significant other Available Help at Discharge: Family;Available PRN/intermittently Type of Home: House Home Access: Stairs to enter Entrance Stairs-Rails: None Entrance Stairs-Number of Steps: 1 Home Layout: Two level Home Equipment: Crutches      Prior Function Level of Independence: Independent               Hand Dominance        Extremity/Trunk Assessment   Upper Extremity Assessment: Overall WFL for tasks assessed           Lower Extremity Assessment: RLE deficits/detail RLE Deficits / Details: Generally weak post-op and painful.      Cervical / Trunk Assessment: Normal  Communication   Communication: No difficulties  Cognition Arousal/Alertness: Awake/alert Behavior During Therapy: WFL for tasks assessed/performed Overall Cognitive Status: Within Functional Limits for tasks assessed                      General Comments  Exercises        Assessment/Plan    PT Assessment Patent does not need any further PT services  PT Diagnosis     PT Problem List    PT Treatment Interventions     PT Goals (Current goals can be found in the Care Plan section) Acute Rehab PT Goals PT Goal Formulation: No goals set, d/c therapy    Frequency     Barriers to discharge        Co-evaluation               End of Session Equipment Utilized During Treatment: Gait belt Activity Tolerance: Patient tolerated treatment well Patient left: in chair;with call bell/phone within  reach;with family/visitor present Nurse Communication: Mobility status    Functional Assessment Tool Used: Clinical Judgement Functional Limitation: Mobility: Walking and moving around Mobility: Walking and Moving Around Current Status ([id number]): At least 1 percent but less than 20 percent impaired, limited or restricted Mobility: Walking and Moving Around Goal Status [phone]): At least 1 percent but less than 20 percent impaired, limited or restricted Mobility: Walking and Moving Around Discharge Status [phone]): At least 1 percent but less than 20 percent impaired, limited or restricted    Time: 0950-1020 PT Time Calculation (min): 30 min   Charges:   PT Evaluation $Initial PT Evaluation Tier I: 1 Procedure PT Treatments $Gait Training: 23-37 mins   PT G Codes:   Functional Assessment Tool Used: Clinical Judgement Functional Limitation: Mobility: Walking and moving around    Donna Black, [place] [phone] 09/19/2013, 11:24 AM

## 2013-09-19 NOTE — Progress Notes (Signed)
Discharge instructions reviewed and questions answered. Verbalizes understanding. Patient's husband to drive patient home.

## 2013-09-19 NOTE — Progress Notes (Signed)
Subjective: 1 Day Post-Op Procedure(s) (LRB): OPEN REDUCTION INTERNAL FIXATION (ORIF) RIGHT ANKLE FRACTURE (Right) Patient reports pain as moderate.    Objective: Vital signs in last 24 hours: Temp:  [97.7 F (36.5 C)-98.3 F (36.8 C)] 97.9 F (36.6 C) (07/29 0438) Pulse Rate:  [66-94] 70 (07/29 0438) Resp:  [13-19] 16 (07/29 0438) BP: (107-158)/(51-92) 114/58 mmHg (07/29 0438) SpO2:  [97 %-100 %] 99 % (07/29 0438) Weight:  [81.194 kg (179 lb)-87 kg (191 lb 12.8 oz)] 87 kg (191 lb 12.8 oz) (07/28 2010)  Intake/Output from previous day: 07/28 0701 - 07/29 0700 In: 1700 [P.O.:600; I.V.:1100] Out: -  Intake/Output this shift:     Recent Labs  09/18/13 1408  HGB 14.0    Recent Labs  09/18/13 1408  WBC 6.6  RBC 4.11  HCT 40.4  PLT 225    Recent Labs  09/18/13 1400  NA 140  K 3.7  CL 102  CO2 23  BUN 11  CREATININE 0.64  GLUCOSE 78  CALCIUM 9.0   No results found for this basename: LABPT, INR,  in the last 72 hours  Sensation intact distally Intact pulses distally Dorsiflexion/Plantar flexion intact Compartment soft  Assessment/Plan: 1 Day Post-Op Procedure(s) (LRB): OPEN REDUCTION INTERNAL FIXATION (ORIF) RIGHT ANKLE FRACTURE (Right) Up with therapy for crutch or walker training prior to discharge to home today. Non-weight bearing on right ankle.  Nazia Rhines Y 09/19/2013, 7:27 AM

## 2013-09-27 ENCOUNTER — Ambulatory Visit (HOSPITAL_COMMUNITY): Payer: Self-pay | Admitting: Psychiatry

## 2013-10-31 ENCOUNTER — Ambulatory Visit (HOSPITAL_COMMUNITY): Payer: Self-pay | Admitting: Psychiatry

## 2013-11-02 ENCOUNTER — Other Ambulatory Visit (HOSPITAL_COMMUNITY): Payer: Self-pay | Admitting: Psychiatry

## 2013-11-05 ENCOUNTER — Telehealth (HOSPITAL_COMMUNITY): Payer: Self-pay | Admitting: *Deleted

## 2013-11-05 NOTE — Telephone Encounter (Signed)
Patient called to request appt to see MD.Was not able to keep last appt due to fractured ankle.New appt on 9/28. Patient crying.[place] office transferred call to RN for assessment. Patient states she has a lot going on and is feeling down. Advised her if any chance of self harm to go to ED or call 911. Patient denies any plan of harming self. States MD has given her something to take for anxiety ad states she will try that. States she will be okay until appt with MD.  Stated she had not seen her therapist in a while due to ankle. Asked her if okay to forward this message to therapist (appt 01/01/14). She stated yes.

## 2013-11-19 ENCOUNTER — Ambulatory Visit (INDEPENDENT_AMBULATORY_CARE_PROVIDER_SITE_OTHER): Payer: Commercial Managed Care - PPO | Admitting: Psychiatry

## 2013-11-19 ENCOUNTER — Encounter (HOSPITAL_COMMUNITY): Payer: Self-pay | Admitting: Psychiatry

## 2013-11-19 VITALS — BP 135/91 | HR 88 | Wt 183.0 lb

## 2013-11-19 DIAGNOSIS — F329 Major depressive disorder, single episode, unspecified: Secondary | ICD-10-CM

## 2013-11-19 DIAGNOSIS — F331 Major depressive disorder, recurrent, moderate: Secondary | ICD-10-CM

## 2013-11-19 MED ORDER — BUPROPION HCL ER (XL) 150 MG PO TB24: ORAL_TABLET | ORAL | Status: DC

## 2013-11-19 MED ORDER — ARIPIPRAZOLE 20 MG PO TABS: 20.0000 mg | ORAL_TABLET | Freq: Every day | ORAL | Status: DC

## 2013-11-19 MED ORDER — LAMOTRIGINE 150 MG PO TABS: ORAL_TABLET | ORAL | Status: DC

## 2013-11-19 NOTE — Progress Notes (Signed)
[facility] Progress Note  Donna Black [id number] 48 y.o.  11/19/2013 3:58 PM  Chief Complaint:    medication management and followup.                History of Present Illness: Donna Black came for her appointment.   She had ankle surgery because she fell in July.  She remember having bad days when she could not move and feels that he depressed and sad.  She took Klonopin 3 times since the last visit.  Overall she is handling her depression very well.  She is excited because she is going to start work this Friday with [employer] insurance.  She had passed all the exam when she is excited to start work.  However she has difficulty walking and she will get some help in the beginning to drive .  She's taking Lamictal Wellbutrin and Abilify.  She denies any side effects.  Her son is also doing very well.  She continues to have marital distress but it is less intense and less frequent from the past.  She is seeing [name] however her next appointment is scheduled but he soon because her therapist is completely booked.  Patient member fewer crying spells .  She is more hopeful .  She denies any agitation or any anger.  She denies any paranoia or any hallucination.  Her thinking is clear.  She is not taking any narcotic pain medication.  Her vitals are stable.  Suicidal Ideation: No Plan Formed: No Patient has means to carry out plan: No  Homicidal Ideation: No Plan Formed: No Patient has means to carry out plan: No  ROS Psychiatric: Agitation: No Hallucination: No Depressed Mood: No Insomnia: Yes Hypersomnia: No Altered Concentration: No Feels Worthless: No Grandiose Ideas: No Belief In Special Powers: No New/Increased Substance Abuse: No Compulsions: No  Neurologic: Headache: No Seizure: No Paresthesias: No  Past Medical History  Diagnosis Date  . Wears glasses   . Depression   . Heart murmur     told off and on in past that she has had one  . Grave's  disease     diagnosed about 7 yrs ago but after a year she has been doing well  . Anemia     when she was younger  . Complication of anesthesia     ?, heart rate dropped w/wisdom teeth  . Cancer 2013    bilateral breast cancer  . Pneumonia   . GERD (gastroesophageal reflux disease)     with pregnancy only  . High cholesterol    She see Dr Alessandra Grout at [facility].   Social History:  Patient lives with her husband.  Outpatient Encounter Prescriptions as of 11/19/2013  Medication Sig  . ARIPiprazole (ABILIFY) 20 MG tablet Take 1 tablet (20 mg total) by mouth daily.  Marland Kitchen buPROPion (WELLBUTRIN XL) 150 MG 24 hr tablet TAKE 1 TABLET BY MOUTH EVERY MORNING  . clonazePAM (KLONOPIN) 0.5 MG tablet Take 1/2 to 1 tab as needed for severe anxiety  . lamoTRIgine (LAMICTAL) 150 MG tablet TAKE 1 TABLET BY MOUTH DAILY.  . [DISCONTINUED] ABILIFY 20 MG tablet TAKE 1 TABLET BY MOUTH DAILY.  . [DISCONTINUED] buPROPion (WELLBUTRIN XL) 150 MG 24 hr tablet TAKE 1 TABLET BY MOUTH EVERY MORNING  . [DISCONTINUED] lamoTRIgine (LAMICTAL) 150 MG tablet TAKE 1 TABLET BY MOUTH DAILY.  Marland Kitchen aspirin EC 325 MG tablet Take 1 tablet (325 mg total) by mouth 2 (two) times daily after  a meal.  . methocarbamol (ROBAXIN) 500 MG tablet Take 1 tablet (500 mg total) by mouth every 6 (six) hours as needed for muscle spasms.  . ondansetron (ZOFRAN) 4 MG tablet Take 1 tablet (4 mg total) by mouth every 8 (eight) hours as needed for nausea or vomiting.  . phentermine 15 MG capsule Take 15 mg by mouth every morning.  . [DISCONTINUED] oxyCODONE-acetaminophen (PERCOCET/ROXICET) 5-325 MG per tablet Take 1-2 tablets by mouth every 4 (four) hours as needed for severe pain.     Past Psychiatric History/Hospitalization(s): Patient has been seeing in this office since 2009.  She was admitted at [facility] due to suicidal attempt when she took overdose on her medication.  At that time she was taking Prozac which was  stopped.   Anxiety: Yes Bipolar Disorder: No Depression: Yes Mania: No Psychosis: Yes Schizophrenia: No Personality Disorder: No Hospitalization for psychiatric illness: Yes History of Electroconvulsive Shock Therapy: No Prior Suicide Attempts: Yes  Physical Exam: Constitutional:  BP 135/91  Pulse 88  Wt 183 lb (83.008 kg)  General Appearance: well nourished.  She is emotional and tearful.  Musculoskeletal: Strength & Muscle Tone: within normal limits Gait & Station: normal Patient leans: Patient has a normal posture  Mental Status Examination: Patient is casually dressed and fairly groomed.  She maintained good eye contact. She described her mood is anxious and her affect is mood appropriate.  She denies any auditory or visual hallucination.  She denies any active or passive suicidal thoughts or homicidal thoughts.  Her thought processes logical and goal-directed.  There were no tremors or shakes.  Her fund of knowledge is adequate.  She is alert and oriented x3.  Her psychomotor activity is normal.  She has difficulty walking because of ankle pain.  Her insight judgment and impulse control is okay.  Established Problem, Stable/Improving (1), Review of Psycho-Social Stressors (1), Review of Last Therapy Session (1) and Review of Medication Regimen & Side Effects (2)  Assessment: Axis I: Maj. depressive disorder  Axis II: Deferred  Axis III: See medical history  Axis IV: Mild  Axis V: 65-70   Plan:  Patient is doing better  on Abilify Lamictal and Wellbutrin.  She does not need a new prescription of Klonopin.  However she had tried Klonopin few times for severe anxiety and it did help.  A new prescription of Lamictal , Abilify and Wellbutrin is given.  Recommended to call us back if she has any question or any concern.  I will see her again in 3 months.   ARFEEN,SYED T., MD 11/19/2013

## 2013-12-13 ENCOUNTER — Ambulatory Visit (HOSPITAL_COMMUNITY): Payer: Self-pay | Admitting: Psychiatry

## 2013-12-14 ENCOUNTER — Ambulatory Visit (INDEPENDENT_AMBULATORY_CARE_PROVIDER_SITE_OTHER): Payer: Commercial Managed Care - PPO | Admitting: Psychiatry

## 2013-12-14 ENCOUNTER — Encounter (HOSPITAL_COMMUNITY): Payer: Self-pay | Admitting: Psychiatry

## 2013-12-14 VITALS — BP 138/78 | HR 90 | Ht 65.0 in | Wt 186.4 lb

## 2013-12-14 DIAGNOSIS — F331 Major depressive disorder, recurrent, moderate: Secondary | ICD-10-CM

## 2013-12-14 DIAGNOSIS — F329 Major depressive disorder, single episode, unspecified: Secondary | ICD-10-CM

## 2013-12-14 NOTE — Progress Notes (Signed)
[facility] Progress Note  Donna Black [id number] 48 y.o.  12/14/2013 9:47 AM  Chief Complaint:  I am under distress.  My husband has a lot of questions.               History of Present Illness: Donna Black came earlier than her scheduled appointment with her husband.  She appears very anxious and stressed out.  She mentioned that her husband requested this appointment because he has a lot of questions.  Her husband who I know him from the past in previous visits reported that patient has some good days and some bad days.  He is concerned about her long-term prognosis.  He is concerned that some days she is very emotional irritable and frustrated and some days she is very happy and her mood is stable.  Patient told that since she started her new job she is unable to give enough time to her husband and has been very busy working.  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  Patient admitted that recently she's been very overwhelmed and very emotional.  She also reported getting easily sensitive about any things.  Patient and her husband talk about their differences, concern and to medication issues.  Patient was not happy because her husband recently discussed her psychiatric illness to his family members.  The patient has been feeling that medicine is working very well however she has not sure if patient is getting enough therapy.  The patient is seeing [name] however for counseling but she is unable to see therapist every week because of her busy schedule.  Patient denies any hallucinations, paranoia however she has trust issues with her husband .  She admitted not sleeping very well in past 2 weeks because she is overwhelmed with her new job and recently she has knee pain .  She denies any active or passive suicidal thoughts or homicidal thoughts.  She denies any aggression or violence.  Her energy level is good.  However she is very emotional and easily tearful.   Her vitals are stable.  Her appetite is okay.  Suicidal Ideation: No Plan Formed: No Patient has means to carry out plan: No  Homicidal Ideation: No Plan Formed: No Patient has means to carry out plan: No  ROS Psychiatric: Agitation: No Hallucination: No Depressed Mood: Yes Insomnia: Yes Hypersomnia: No Altered Concentration: No Feels Worthless: No Grandiose Ideas: No Belief In Special Powers: No New/Increased Substance Abuse: No Compulsions: No  Neurologic: Headache: No Seizure: No Paresthesias: No  Past Medical History  Diagnosis Date  . Wears glasses   . Depression   . Heart murmur     told off and on in past that she has had one  . Grave's disease     diagnosed about 7 yrs ago but after a year she has been doing well  . Anemia     when she was younger  . Complication of anesthesia     ?, heart rate dropped w/wisdom teeth  . Cancer 2013    bilateral breast cancer  . Pneumonia   . GERD (gastroesophageal reflux disease)     with pregnancy only  . High cholesterol    She see Dr Alessandra Grout at [facility].   Social History:  Patient lives with her husband.  Outpatient Encounter Prescriptions as of 12/14/2013  Medication Sig  . ARIPiprazole (ABILIFY) 20 MG tablet Take 1 tablet (20 mg total) by mouth daily.  Marland Kitchen aspirin  EC 325 MG tablet Take 1 tablet (325 mg total) by mouth 2 (two) times daily after a meal.  . buPROPion (WELLBUTRIN XL) 150 MG 24 hr tablet TAKE 1 TABLET BY MOUTH EVERY MORNING  . clonazePAM (KLONOPIN) 0.5 MG tablet Take 1/2 to 1 tab as needed for severe anxiety  . lamoTRIgine (LAMICTAL) 150 MG tablet TAKE 1 TABLET BY MOUTH DAILY.  . methocarbamol (ROBAXIN) 500 MG tablet Take 1 tablet (500 mg total) by mouth every 6 (six) hours as needed for muscle spasms.  . ondansetron (ZOFRAN) 4 MG tablet Take 1 tablet (4 mg total) by mouth every 8 (eight) hours as needed for nausea or vomiting.  . phentermine 15 MG capsule Take 15 mg by mouth every  morning.     Past Psychiatric History/Hospitalization(s): Patient has been seeing in this office since 2009.  She was admitted at [facility] due to suicidal attempt when she took overdose on her medication.  At that time she was taking Prozac which was stopped.   Anxiety: Yes Bipolar Disorder: No Depression: Yes Mania: No Psychosis: Yes Schizophrenia: No Personality Disorder: No Hospitalization for psychiatric illness: Yes History of Electroconvulsive Shock Therapy: No Prior Suicide Attempts: Yes  Physical Exam: Constitutional:  BP 138/78  Pulse 90  Ht 5\' 5"  (1.651 m)  Wt 186 lb 6.4 oz (84.55 kg)  BMI 31.02 kg/m2  General Appearance: well nourished.  She is emotional and tearful.  Musculoskeletal: Strength & Muscle Tone: within normal limits Gait & Station: normal Patient leans: Patient has a normal posture  Mental Status Examination: Patient is casually dressed and fairly groomed.  She maintained good eye contact. She described her mood is anxious and emotional.  Sometimes she is easily tearful when she is talking about her husband. She denies any auditory or visual hallucination.  She denies any active or passive suicidal thoughts or homicidal thoughts.  Her thought processes logical and goal-directed.  There were no tremors or shakes.  Her fund of knowledge is adequate.  She is alert and oriented x3.  Her psychomotor activity is normal.  She has difficulty walking because of ankle pain.  Her insight judgment and impulse control is okay.  Established Problem, Stable/Improving (1), Review of Psycho-Social Stressors (1), Established Problem, Worsening (2), New Problem, with no additional work-up planned (3), Review of Last Therapy Session (1) and Review of Medication Regimen & Side Effects (2)  Assessment: Axis I: Maj. depressive disorder  Axis II: Deferred  Axis III: See medical history  Axis IV: Mild  Axis V: 65-70   Plan:  I had a long discussion  with the patient and her husband.  Try to improve communication Lelon Frohlich comes in to marriage counseling .  Recommended to use Klonopin the patient is ready upset anxious and unable to sleep.  She does not want to go back with Richardo Priest and she is unable to see [name] more frequently because of her busy schedule.  She started new job recently and feeling overwhelmed.  We discuss that there will be no major medication changes however she does need counseling on a regular basis.  I will provide him with information on [facility] for CBT and marriage counseling.  Discussed medication side effects.  Patient does not have any rash or itching.  She is taking Abilify, Lamictal and Wellbutrin.  She has no tremors or shakes.  Recommended to call us back if she has any question or any concern.  I will see her  again in 2-3 months.  Time spent 25 minutes.  More than 50% of the time spent in psychoeducation, counseling and coordination of care.  Discuss safety plan that anytime having active suicidal thoughts or homicidal thoughts then patient need to call 911 or go to the local emergency room.   Rosemarie Galvis T., MD 12/14/2013

## 2014-01-01 ENCOUNTER — Ambulatory Visit (HOSPITAL_COMMUNITY): Payer: Self-pay | Admitting: Psychiatry

## 2014-01-07 ENCOUNTER — Ambulatory Visit (HOSPITAL_COMMUNITY): Payer: Self-pay | Admitting: Psychiatry

## 2014-01-14 ENCOUNTER — Ambulatory Visit (HOSPITAL_COMMUNITY): Payer: Self-pay | Admitting: Psychiatry

## 2014-01-25 ENCOUNTER — Telehealth (HOSPITAL_COMMUNITY): Payer: Self-pay

## 2014-01-28 ENCOUNTER — Other Ambulatory Visit (HOSPITAL_COMMUNITY): Payer: Self-pay | Admitting: Psychiatry

## 2014-01-28 MED ORDER — CLONAZEPAM 0.5 MG PO TABS
ORAL_TABLET | ORAL | Status: DC
Start: 2014-01-28 — End: 2014-03-01

## 2014-02-18 ENCOUNTER — Ambulatory Visit (HOSPITAL_COMMUNITY): Payer: Self-pay | Admitting: Psychiatry

## 2014-03-01 ENCOUNTER — Other Ambulatory Visit (HOSPITAL_COMMUNITY): Payer: Self-pay | Admitting: *Deleted

## 2014-03-01 MED ORDER — CLONAZEPAM 0.5 MG PO TABS: ORAL_TABLET | ORAL | Status: DC

## 2014-03-01 NOTE — Telephone Encounter (Signed)
Chart reviewed. Refill appropriate. Pt given refill of 6 tabs of Clonazepam, enough until her next appointment on 03/07/14. Pt states she has been taking 1 pill daily and not the 1/2 tab daily as needed for anxiety.  Pt planning to come for next appointment to discuss need for whole tablet and anxiety.

## 2014-03-04 ENCOUNTER — Other Ambulatory Visit (HOSPITAL_COMMUNITY): Payer: Self-pay | Admitting: Psychiatry

## 2014-03-04 DIAGNOSIS — F331 Major depressive disorder, recurrent, moderate: Secondary | ICD-10-CM

## 2014-03-04 MED ORDER — CLONAZEPAM 0.5 MG PO TABS: ORAL_TABLET | ORAL | Status: DC

## 2014-03-07 ENCOUNTER — Ambulatory Visit (INDEPENDENT_AMBULATORY_CARE_PROVIDER_SITE_OTHER): Payer: Commercial Managed Care - PPO | Admitting: Psychiatry

## 2014-03-07 ENCOUNTER — Encounter (HOSPITAL_COMMUNITY): Payer: Self-pay | Admitting: Psychiatry

## 2014-03-07 VITALS — BP 147/79 | HR 80 | Ht 65.0 in | Wt 184.4 lb

## 2014-03-07 DIAGNOSIS — F329 Major depressive disorder, single episode, unspecified: Secondary | ICD-10-CM

## 2014-03-07 DIAGNOSIS — F331 Major depressive disorder, recurrent, moderate: Secondary | ICD-10-CM

## 2014-03-07 MED ORDER — ARIPIPRAZOLE 20 MG PO TABS
30.0000 mg | ORAL_TABLET | Freq: Every day | ORAL | Status: DC
Start: 2014-03-07 — End: 2014-03-11

## 2014-03-07 MED ORDER — CLONAZEPAM 0.5 MG PO TABS: 0.5000 mg | ORAL_TABLET | Freq: Two times a day (BID) | ORAL | Status: DC

## 2014-03-07 MED ORDER — BUPROPION HCL ER (XL) 150 MG PO TB24: ORAL_TABLET | ORAL | Status: DC

## 2014-03-07 MED ORDER — LAMOTRIGINE 150 MG PO TABS: ORAL_TABLET | ORAL | Status: DC

## 2014-03-07 NOTE — Progress Notes (Signed)
[place] [id number] Progress Note  Donna Black [id number] 49 y.o.  03/07/2014 3:23 PM  Chief Complaint:  I have a bad week.  I'm not doing very well.  I have a lot of crying spells.                 History of Present Illness: Donna Black came for her follow-up appointment.  She is complaining of increased anxiety, depression and having crying spells.  She admitted having paranoia about her husband and her father who she believe does not believe her.  She endorse that every day her husband said something which causes issues and she started to feel guilty about it.  She start thinking again to leave him but she also thinks about her son Donna Black.  Patient is very attached to her son.  She admitted having mixed feelings because she does not want to cause any harm to [facility]  She is seeing Hulen Luster 2 times a week and last week she saw 3 times.  She admitted poor sleep, irritability, paranoia and she does not trust her husband and her father.  However she is taking medication as prescribed.  She is taking Klonopin every day but is keeping her calm.  Patient denies any tremors, shakes, EPS or any hallucination.  However she does feel paranoid and delusion about her husband.  During conversation she was tearful, emotional and distraught.  She is very sensitive about certain things specially about her husband.  She believe that her husband is following her and may have a device at home to keep her activities watching.  She denies any aggression or violence.  Her energy level is decreased.  Her appetite is okay.  Today her blood pressure was very high however after repeat reading it came down somewhat.  She is open to try a higher dose of medication.  Suicidal Ideation: No Plan Formed: No Patient has means to carry out plan: No  Homicidal Ideation: No Plan Formed: No Patient has means to carry out plan: No  Review of Systems  Constitutional: Positive for malaise/fatigue.  Skin: Negative  for itching and rash.  Psychiatric/Behavioral: Positive for depression. Negative for suicidal ideas. The patient is nervous/anxious and has insomnia.        Paranoia   Psychiatric: Agitation: No Hallucination: No Depressed Mood: Yes Insomnia: Yes Hypersomnia: No Altered Concentration: No Feels Worthless: No Grandiose Ideas: No Belief In Special Powers: No New/Increased Substance Abuse: No Compulsions: No  Neurologic: Headache: No Seizure: No Paresthesias: No  Past Medical History  Diagnosis Date  . Wears glasses   . Depression   . Heart murmur     told off and on in past that she has had one  . Grave's disease     diagnosed about 7 yrs ago but after a year she has been doing well  . Anemia     when she was younger  . Complication of anesthesia     ?, heart rate dropped w/wisdom teeth  . Cancer 2013    bilateral breast cancer  . Pneumonia   . GERD (gastroesophageal reflux disease)     with pregnancy only  . High cholesterol    She see Dr Alessandra Grout at [facility].   Social History:  Patient lives with her husband.  Outpatient Encounter Prescriptions as of 03/07/2014  Medication Sig  . ARIPiprazole (ABILIFY) 20 MG tablet Take 1.5 tablets (30 mg total) by mouth daily.  Marland Kitchen aspirin EC 325 MG tablet  Take 1 tablet (325 mg total) by mouth 2 (two) times daily after a meal.  . buPROPion (WELLBUTRIN XL) 150 MG 24 hr tablet TAKE 1 TABLET BY MOUTH EVERY MORNING  . clonazePAM (KLONOPIN) 0.5 MG tablet Take 1 tablet (0.5 mg total) by mouth 2 (two) times daily.  Marland Kitchen lamoTRIgine (LAMICTAL) 150 MG tablet TAKE 1 TABLET BY MOUTH DAILY.  . methocarbamol (ROBAXIN) 500 MG tablet Take 1 tablet (500 mg total) by mouth every 6 (six) hours as needed for muscle spasms.  . ondansetron (ZOFRAN) 4 MG tablet Take 1 tablet (4 mg total) by mouth every 8 (eight) hours as needed for nausea or vomiting.  . phentermine 15 MG capsule Take 15 mg by mouth every morning.  . [DISCONTINUED]  ARIPiprazole (ABILIFY) 20 MG tablet Take 1 tablet (20 mg total) by mouth daily.  . [DISCONTINUED] buPROPion (WELLBUTRIN XL) 150 MG 24 hr tablet TAKE 1 TABLET BY MOUTH EVERY MORNING  . [DISCONTINUED] clonazePAM (KLONOPIN) 0.5 MG tablet Take 1/2 to 1 tab as needed for severe anxiety  . [DISCONTINUED] lamoTRIgine (LAMICTAL) 150 MG tablet TAKE 1 TABLET BY MOUTH DAILY.     Past Psychiatric History/Hospitalization(s): Patient has been seeing in this office since 2009.  She was admitted at [facility] due to suicidal attempt when she took overdose on her medication.  At that time she was taking Prozac which was stopped.   Anxiety: Yes Bipolar Disorder: No Depression: Yes Mania: No Psychosis: Yes Schizophrenia: No Personality Disorder: No Hospitalization for psychiatric illness: Yes History of Electroconvulsive Shock Therapy: No Prior Suicide Attempts: Yes  Physical Exam: Constitutional:  BP 147/79 mmHg  Pulse 80  Ht 5\' 5"  (1.651 m)  Wt 184 lb 6.4 oz (83.643 kg)  BMI 30.69 kg/m2  General Appearance: well nourished.  She is emotional and tearful.  Musculoskeletal: Strength & Muscle Tone: within normal limits Gait & Station: normal Patient leans: Patient has a normal posture  Mental Status Examination: Patient is casually dressed and fairly groomed.  She is easily tearful and appears very emotional.  She maintained fair eye contact.  She described her mood sad and depressed and her affect is constricted.  She denies any auditory or visual hallucination.  She endorse paranoia about her husband and her father.  However she denies any active or passive suicidal parts or homicidal thought.  There were no tremors, shakes or any EPS.  Her fund of knowledge is adequate.  She is alert and oriented x3.  Her psychomotor activity is normal.  Her insight judgment and impulse control is okay.  Established Problem, Stable/Improving (1), Review of Psycho-Social Stressors (1),  Established Problem, Worsening (2), Review of Last Therapy Session (1), Review of Medication Regimen & Side Effects (2) and Review of New Medication or Change in Dosage (2)  Assessment: Axis I: Maj. depressive disorder  Axis II: Deferred  Axis III: See medical history  Axis IV: Mild  Axis V: 65-70   Plan:  I do believe patient is slowly decompensating.  I would increase Abilify 30 mg daily and recommended to take Klonopin 0.5 mg twice a day.  I also recommended to discuss with Hulen Luster to bring her son for a joint counseling or if not think about marriage counseling.  Continue Lamictal 150 mg daily, Wellbutrin XL 150 mg daily.  Patient does not have any rash or itching.  Discussed medication side effects and benefits.  I will see her again in 4 weeks. Time spent 25 minutes.  More than 50% of the time spent in psychoeducation, counseling and coordination of care.  Discuss safety plan that anytime having active suicidal thoughts or homicidal thoughts then patient need to call 911 or go to the local emergency room.   Bettyjean Stefanski T., MD 03/07/2014

## 2014-03-08 ENCOUNTER — Telehealth (HOSPITAL_COMMUNITY): Payer: Self-pay | Admitting: *Deleted

## 2014-03-08 NOTE — Telephone Encounter (Signed)
Dr. Adele Schilder changed Abilify prescription, patient went to pick prescription up at [facility] and it will cost $1000.Marland Kitchen Has questions about prescription.

## 2014-03-11 ENCOUNTER — Telehealth (HOSPITAL_COMMUNITY): Payer: Self-pay | Admitting: Psychiatry

## 2014-03-11 DIAGNOSIS — F331 Major depressive disorder, recurrent, moderate: Secondary | ICD-10-CM

## 2014-03-11 MED ORDER — ARIPIPRAZOLE 30 MG PO TABS: 30.0000 mg | ORAL_TABLET | Freq: Every day | ORAL | Status: DC

## 2014-03-11 NOTE — Telephone Encounter (Signed)
I called the pharmacy and change direction and quantity of Abilify.  The new prescription will be Abilify 30 mg 1 tablet daily.  Previous prescription was 20 mg 1-1/2 tablet daily.  I also left message to patient.

## 2014-03-20 ENCOUNTER — Other Ambulatory Visit: Payer: Self-pay | Admitting: Orthopaedic Surgery

## 2014-03-20 DIAGNOSIS — M25562 Pain in left knee: Secondary | ICD-10-CM

## 2014-03-26 ENCOUNTER — Ambulatory Visit
Admission: RE | Admit: 2014-03-26 | Discharge: 2014-03-26 | Disposition: A | Discharge status: Home / Self Care | Payer: Self-pay | Source: Ambulatory Visit | Attending: Orthopaedic Surgery | Admitting: Orthopaedic Surgery

## 2014-03-26 DIAGNOSIS — M25562 Pain in left knee: Secondary | ICD-10-CM

## 2014-03-28 ENCOUNTER — Other Ambulatory Visit: Payer: Self-pay | Admitting: Oral Surgery

## 2014-04-04 ENCOUNTER — Ambulatory Visit (INDEPENDENT_AMBULATORY_CARE_PROVIDER_SITE_OTHER): Payer: Self-pay | Admitting: Psychiatry

## 2014-04-04 ENCOUNTER — Encounter (HOSPITAL_COMMUNITY): Payer: Self-pay | Admitting: Psychiatry

## 2014-04-04 VITALS — BP 138/86 | HR 99 | Ht 65.0 in | Wt 175.8 lb

## 2014-04-04 DIAGNOSIS — F329 Major depressive disorder, single episode, unspecified: Secondary | ICD-10-CM

## 2014-04-04 DIAGNOSIS — F331 Major depressive disorder, recurrent, moderate: Secondary | ICD-10-CM

## 2014-04-04 MED ORDER — CLONAZEPAM 0.5 MG PO TABS: 0.5000 mg | ORAL_TABLET | Freq: Two times a day (BID) | ORAL | Status: DC

## 2014-04-04 MED ORDER — ARIPIPRAZOLE 30 MG PO TABS: 30.0000 mg | ORAL_TABLET | Freq: Every day | ORAL | Status: DC

## 2014-04-04 MED ORDER — BUPROPION HCL ER (XL) 150 MG PO TB24: ORAL_TABLET | ORAL | Status: DC

## 2014-04-04 MED ORDER — LAMOTRIGINE 150 MG PO TABS: ORAL_TABLET | ORAL | Status: DC

## 2014-04-04 NOTE — Progress Notes (Signed)
Donna Black [phone] Progress Note  Donna Black [id number] 49 y.o.  04/04/2014 4:47 PM  Chief Complaint:  I continues to have crying spells.  I cannot trust my husband.                 History of Present Illness: Donna Black came for her follow-up appointment.  She remained very anxious nervous and tearful.  She does not report any side effects of medication .  Her Abilify was increased on her last visit.  She continued to endorse marital issue and believe that she cannot trust her husband.  She mentioned every day her husband said something that hurts a lot.  She is seeing Donna Black weekly and she is happy about her therapy.  She is excited that she is going [place] to visit her on .  Patient is not thinking seriously to get separation so that she does not have to live miserable.  She is taking Klonopin which she likes her a lot.  She always also taking Lamictal and Abilify.  She denies any tremors or shakes.  Patient is talking a lot about her son that she does not want hurt him because sometimes she feels that her son Donna Black gets very sad when he sees her crying.  Patient endorsed that she has very limited support from her husband.  She has cut down her drinking and she has not remembered drinking since the last visit.  Patient told she has no desire to have a drink.  She is very happy that she lost her weight.  Her appetite is okay.  She is social and active.  Patient denies any suicidal thoughts, paranoia or any hallucination.  She sleeping good however she still have racing thoughts, chronic sadness and discouragement.  She admitted poor self-esteem and some time indecisiveness about her marriage.  However she denies any feeling of hopelessness or worthlessness.  Her appetite is okay.  Her vitals are stable.  Suicidal Ideation: No Plan Formed: No Patient has means to carry out plan: No  Homicidal Ideation: No Plan Formed: No Patient has means to carry out plan:  No  Review of Systems  Skin: Negative for itching and rash.  Psychiatric/Behavioral: Negative for suicidal ideas and substance abuse. The patient is nervous/anxious and has insomnia.    Psychiatric: Agitation: No Hallucination: No Depressed Mood: Yes Insomnia: Yes Hypersomnia: No Altered Concentration: No Feels Worthless: No Grandiose Ideas: No Belief In Special Powers: No New/Increased Substance Abuse: No Compulsions: No  Neurologic: Headache: No Seizure: No Paresthesias: No  Past Medical History  Diagnosis Date  . Wears glasses   . Depression   . Heart murmur     told off and on in past that she has had one  . Grave's disease     diagnosed about 7 yrs ago but after a year she has been doing well  . Anemia     when she was younger  . Complication of anesthesia     ?, heart rate dropped w/wisdom teeth  . Cancer 2013    bilateral breast cancer  . Pneumonia   . GERD (gastroesophageal reflux disease)     with pregnancy only  . High cholesterol    She see Dr Alessandra Grout at [facility].   Social History:  Patient lives with her husband.  Outpatient Encounter Prescriptions as of 04/04/2014  Medication Sig  . ARIPiprazole (ABILIFY) 30 MG tablet Take 1 tablet (30 mg total) by mouth daily.  Marland Kitchen aspirin  EC 325 MG tablet Take 1 tablet (325 mg total) by mouth 2 (two) times daily after a meal.  . buPROPion (WELLBUTRIN XL) 150 MG 24 hr tablet TAKE 1 TABLET BY MOUTH EVERY MORNING  . clonazePAM (KLONOPIN) 0.5 MG tablet Take 1 tablet (0.5 mg total) by mouth 2 (two) times daily.  Marland Kitchen lamoTRIgine (LAMICTAL) 150 MG tablet TAKE 1 TABLET BY MOUTH DAILY.  . [DISCONTINUED] ARIPiprazole (ABILIFY) 30 MG tablet Take 1 tablet (30 mg total) by mouth daily.  . [DISCONTINUED] buPROPion (WELLBUTRIN XL) 150 MG 24 hr tablet TAKE 1 TABLET BY MOUTH EVERY MORNING  . [DISCONTINUED] clonazePAM (KLONOPIN) 0.5 MG tablet Take 1 tablet (0.5 mg total) by mouth 2 (two) times daily.  . [DISCONTINUED]  lamoTRIgine (LAMICTAL) 150 MG tablet TAKE 1 TABLET BY MOUTH DAILY.  . [DISCONTINUED] methocarbamol (ROBAXIN) 500 MG tablet Take 1 tablet (500 mg total) by mouth every 6 (six) hours as needed for muscle spasms.  . [DISCONTINUED] ondansetron (ZOFRAN) 4 MG tablet Take 1 tablet (4 mg total) by mouth every 8 (eight) hours as needed for nausea or vomiting.  . [DISCONTINUED] phentermine 15 MG capsule Take 15 mg by mouth every morning.     Past Psychiatric History/Hospitalization(s): Patient has been seeing in this office since 2009.  She was admitted at [facility] due to suicidal attempt when she took overdose on her medication.  At that time she was taking Prozac which was stopped.   Anxiety: Yes Bipolar Disorder: No Depression: Yes Mania: No Psychosis: Yes Schizophrenia: No Personality Disorder: No Hospitalization for psychiatric illness: Yes History of Electroconvulsive Shock Therapy: No Prior Suicide Attempts: Yes  Physical Exam: Constitutional:  BP 138/86 mmHg  Pulse 99  Ht 5\' 5"  (1.651 m)  Wt 175 lb 12.8 oz (79.742 kg)  BMI 29.25 kg/m2  General Appearance: well nourished.  She is emotional and tearful.  Musculoskeletal: Strength & Muscle Tone: within normal limits Gait & Station: normal Patient leans: Patient has a normal posture  Mental Status Examination: Patient is casually dressed and fairly groomed.  She is easily tearful and appears very emotional.  She maintained fair eye contact.  She described her mood sad and depressed and her affect is constricted.  She denies any auditory or visual hallucination.  She denies any paranoia however she admitted difficulty trusting on her husband.  She denies any active or passive suicidal parts or homicidal thought.  There were no delusions, obsessive thoughts.  She has no tremors, shakes or any EPS.  Her fund of knowledge is adequate.  She is alert and oriented x3.  Her psychomotor activity is normal.  Her insight  judgment and impulse control is okay.  Established Problem, Stable/Improving (1), Review of Psycho-Social Stressors (1), Decision to obtain old records (1), New Problem, with no additional work-up planned (3), Review of Last Therapy Session (1) and Review of Medication Regimen & Side Effects (2)  Assessment: Axis I: Maj. depressive disorder  Axis II: Deferred  Axis III: See medical history  Plan:  I have a long discussion with the patient about her marriage life.  Patient continued to express sadness and discouragement in her marriage life.  She consider that her husband is not supportive .  She is not thinking about separation and I encourage her to see counselor Donna Black to discuss further options and discuss more in detail with him.  At this time patient does not require any changes in medication she endorse her mood has been improved  since Abilify is increased.  I will continue Abilify 30 mg daily, Klonopin 0.5 mg twice a day, Lamictal 150 mg daily, Wellbutrin XL 150 mg daily.  Patient does not have any rash or itching.  Discussed medication side effects and benefits.  I will see her again in 8 weeks. Time spent 25 minutes.  More than 50% of the time spent in psychoeducation, counseling and coordination of care.  Discuss safety plan that anytime having active suicidal thoughts or homicidal thoughts then patient need to call 911 or go to the local emergency room.   Lamarion Mcevers T., MD 04/04/2014

## 2014-04-29 ENCOUNTER — Encounter (HOSPITAL_COMMUNITY): Payer: Self-pay | Admitting: Psychiatry

## 2014-04-29 ENCOUNTER — Ambulatory Visit (INDEPENDENT_AMBULATORY_CARE_PROVIDER_SITE_OTHER): Payer: Self-pay | Admitting: Psychiatry

## 2014-04-29 DIAGNOSIS — F331 Major depressive disorder, recurrent, moderate: Secondary | ICD-10-CM

## 2014-04-29 NOTE — Progress Notes (Signed)
[place] Progress Note  Donna Black [id number] 49 y.o.  04/29/2014 4:35 PM  Chief Complaint:  I decided to get separation from Donna Black.  I bring my daughters today so we can discuss this issue more in detail in front of you.                  History of Present Illness: Donna Black came earlier than her scheduled appointment.  She had decided to get separated from her husband.  She reported she make that decision last weekend when she had argument with her husband and she admitted drinking but later threw up because she does not want to drink.  She reported that living with her husband making her to do destructive things in her life and she's been talking to her therapist regularly about separating herself and give a chance to live by herself.  Her 2 twin daughters came with her on this appointment.  Since she had decided this decision she's been very calm, pleasant and happy.  She also got a job at [employer] and she will start the job in few days.  She is sleeping better.  She mentioned that her daughters have concerns about my decision.  I spoke to the daughters in detail who mentioned that mother has history of depression and she gets easily lonely , isolated and withdrawn when she is by herself.  However patient reported that once she do not get support from her husband she get more depressed sad and withdrawn.  She really wants to try living her by herself.  She also working on a plan so she can see her 57 year old son Donna Black every other weekend and she is allowed to go back home whenever she wants and to have breakfast with him without any restrictions.  Patient hoping that her plan will work.  She sleeping good.  Sometimes she is complaining of tiredness with Klonopin but overall she has no major panic attack, anxiety symptoms, irritability, anger or any paranoia.  Her appetite is okay.  She is doing regular exercise and she is more active social.  She reported her self-esteem is  getting better since she maintained that decision.  Her vitals are stable.  Suicidal Ideation: No Plan Formed: No Patient has means to carry out plan: No  Homicidal Ideation: No Plan Formed: No Patient has means to carry out plan: No  ROS Psychiatric: Agitation: No Hallucination: No Depressed Mood: No Insomnia: No Hypersomnia: No Altered Concentration: No Feels Worthless: No Grandiose Ideas: No Belief In Special Powers: No New/Increased Substance Abuse: No Compulsions: No  Neurologic: Headache: No Seizure: No Paresthesias: No  Past Medical History  Diagnosis Date  . Wears glasses   . Depression   . Heart murmur     told off and on in past that she has had one  . Grave's disease     diagnosed about 7 yrs ago but after a year she has been doing well  . Anemia     when she was younger  . Complication of anesthesia     ?, heart rate dropped w/wisdom teeth  . Cancer 2013    bilateral breast cancer  . Pneumonia   . GERD (gastroesophageal reflux disease)     with pregnancy only  . High cholesterol    She see Dr Alessandra Grout at [facility].   Social History:  Patient lives with her husband.  Outpatient Encounter Prescriptions as of 04/29/2014  Medication Sig  . ARIPiprazole (  ABILIFY) 30 MG tablet Take 1 tablet (30 mg total) by mouth daily.  Marland Kitchen aspirin EC 325 MG tablet Take 1 tablet (325 mg total) by mouth 2 (two) times daily after a meal.  . buPROPion (WELLBUTRIN XL) 150 MG 24 hr tablet TAKE 1 TABLET BY MOUTH EVERY MORNING  . clonazePAM (KLONOPIN) 0.5 MG tablet Take 1 tablet (0.5 mg total) by mouth 2 (two) times daily.  Marland Kitchen lamoTRIgine (LAMICTAL) 150 MG tablet TAKE 1 TABLET BY MOUTH DAILY.     Past Psychiatric History/Hospitalization(s): Patient has been seeing in this office since 2009.  She was admitted at [facility] due to suicidal attempt when she took overdose on her medication.  At that time she was taking Prozac which was stopped.    Anxiety: Yes Bipolar Disorder: No Depression: Yes Mania: No Psychosis: Yes Schizophrenia: No Personality Disorder: No Hospitalization for psychiatric illness: Yes History of Electroconvulsive Shock Therapy: No Prior Suicide Attempts: Yes  Physical Exam: Constitutional:  There were no vitals taken for this visit.  General Appearance: well nourished.    Musculoskeletal: Strength & Muscle Tone: within normal limits Gait & Station: normal Patient leans: Patient has a normal posture  Mental Status Examination: Patient is casually dressed and fairly groomed.  She is pleasant and cooperative.  She maintained good eye contact.  She described her mood good and her affect is appropriate.  She denies any auditory or visual hallucination.  She denies any paranoia , delusion or any obsessive thoughts.  She denies any active or passive suicidal parts or homicidal thought.  She has no tremors, shakes or any EPS.  Her fund of knowledge is adequate.  She is alert and oriented x3.  Her psychomotor activity is normal.  Her insight judgment and impulse control is okay.  Established Problem, Stable/Improving (1), Review of Psycho-Social Stressors (1), New Problem, with no additional work-up planned (3), Review of Last Therapy Session (1) and Review of Medication Regimen & Side Effects (2)  Assessment: Axis I: Maj. depressive disorder  Axis II: Deferred  Axis III: See medical history  Plan:  I have a long discussion with the patient and her twin daughters.  Talk about supportive role of her daughter's why she is making a big decision about her life.  However I encourage strongly that keep a close eye on patient's behavior and symptoms as it could be challenging and if patient's daughter noticed worsening of the symptom or if patient notices decompensation into her illness and she should call us immediately.  I also encourage to see Donna Black more frequently for coping skills.  We talked about  stopping drinking and she promised that she will not drink anymore since she decided to live by herself and it'll be less stressful.  Discussed medication side effects and benefits.  Recommended to take Klonopin second dose at bedtime to avoid feeling tired in the evening.  Continue Wellbutrin XL 140 mg daily, Lamictal 150 mg daily, Abilify 30 mg daily .  Recommended to call us back if she has any question, concern or if she feels worsening of the symptom.  Follow-up in 3 weeks.  Patient has appointment on April 11 . Time spent 25 minutes.  More than 50% of the time spent in psychoeducation, counseling and coordination of care.  Discuss safety plan that anytime having active suicidal thoughts or homicidal thoughts then patient need to call 911 or go to the local emergency room.   Asiel Chrostowski T., MD 04/29/2014

## 2014-06-04 ENCOUNTER — Ambulatory Visit (INDEPENDENT_AMBULATORY_CARE_PROVIDER_SITE_OTHER): Payer: Self-pay | Admitting: Psychiatry

## 2014-06-04 ENCOUNTER — Encounter (HOSPITAL_COMMUNITY): Payer: Self-pay | Admitting: Psychiatry

## 2014-06-04 VITALS — BP 134/76 | HR 100 | Ht 65.0 in | Wt 175.0 lb

## 2014-06-04 DIAGNOSIS — F331 Major depressive disorder, recurrent, moderate: Secondary | ICD-10-CM

## 2014-06-04 DIAGNOSIS — F329 Major depressive disorder, single episode, unspecified: Secondary | ICD-10-CM

## 2014-06-04 MED ORDER — BUPROPION HCL ER (XL) 150 MG PO TB24: ORAL_TABLET | ORAL | Status: DC

## 2014-06-04 MED ORDER — ARIPIPRAZOLE 30 MG PO TABS: 30.0000 mg | ORAL_TABLET | Freq: Every day | ORAL | Status: DC

## 2014-06-04 MED ORDER — LAMOTRIGINE 150 MG PO TABS: ORAL_TABLET | ORAL | Status: DC

## 2014-06-04 MED ORDER — CLONAZEPAM 0.5 MG PO TABS: 0.5000 mg | ORAL_TABLET | Freq: Two times a day (BID) | ORAL | Status: DC

## 2014-06-04 NOTE — Progress Notes (Signed)
[facility] Progress Note  Donna Black [id number] 49 y.o.  06/04/2014 4:36 PM  Chief Complaint:  I lost my job.  But I'm doing better.  I'm taking the medication.  History of Present Illness: Donna Black came for her follow-up appointment.  She is taking her medication as prescribed.  Her sleep is good .  She was upset as she lost her job yesterday .  Patient told she was not happy there because people are very interested in the personal information .  She has a job interview and she is hoping to get a job very soon.  Patient is still has moments of anxiety, paranoia, crying spells and poor sleep.  Overall she is doing better but she admitted episodes of depression and crying spells.  She is worried about her 35 year old son.  She decided to leave her husband but she has no other choice.  She had a good support from her daughters who came last time with her.  Patient told her father has seizures and he is sick.  Patient continues to have issues with her husband and she had decided to get separation very soon when she has more stable living situation.  Patient has cut down her drinking from the past denies any binge or any illegal substance use.  Her appetite is okay.  She has lost weight from the past.  She is more energetic, social and active.  Sometimes she takes her son to outside for a walk but she liked a lot.  Patient denies any suicidal thoughts or homicidal thought.  She is seeing Hulen Luster for counseling and she admitted some time she's been very demanding and needy for counseling.  However she denies much improvement with the medication and reported no aggression, violence, hallucination or any manic-like symptoms.   Suicidal Ideation: No Plan Formed: No Patient has means to carry out plan: No  Homicidal Ideation: No Plan Formed: No Patient has means to carry out plan: No  Review of Systems  Skin: Negative for itching and rash.   Psychiatric: Agitation:  No Hallucination: No Depressed Mood: No Insomnia: No Hypersomnia: No Altered Concentration: No Feels Worthless: No Grandiose Ideas: No Belief In Special Powers: No New/Increased Substance Abuse: No Compulsions: No  Neurologic: Headache: No Seizure: No Paresthesias: No  Past Medical History  Diagnosis Date  . Wears glasses   . Depression   . Heart murmur     told off and on in past that she has had one  . Grave's disease     diagnosed about 7 yrs ago but after a year she has been doing well  . Anemia     when she was younger  . Complication of anesthesia     ?, heart rate dropped w/wisdom teeth  . Cancer 2013    bilateral breast cancer  . Pneumonia   . GERD (gastroesophageal reflux disease)     with pregnancy only  . High cholesterol    She see Dr Alessandra Grout at [facility].   Social History:  Patient lives with her husband.  Outpatient Encounter Prescriptions as of 06/04/2014  Medication Sig  . ARIPiprazole (ABILIFY) 30 MG tablet Take 1 tablet (30 mg total) by mouth daily.  Marland Kitchen aspirin EC 325 MG tablet Take 1 tablet (325 mg total) by mouth 2 (two) times daily after a meal.  . buPROPion (WELLBUTRIN XL) 150 MG 24 hr tablet TAKE 1 TABLET BY MOUTH EVERY MORNING  . clonazePAM (KLONOPIN) 0.5 MG  tablet Take 1 tablet (0.5 mg total) by mouth 2 (two) times daily.  Marland Kitchen lamoTRIgine (LAMICTAL) 150 MG tablet TAKE 1 TABLET BY MOUTH DAILY.  . [DISCONTINUED] ARIPiprazole (ABILIFY) 30 MG tablet Take 1 tablet (30 mg total) by mouth daily.  . [DISCONTINUED] buPROPion (WELLBUTRIN XL) 150 MG 24 hr tablet TAKE 1 TABLET BY MOUTH EVERY MORNING  . [DISCONTINUED] clonazePAM (KLONOPIN) 0.5 MG tablet Take 1 tablet (0.5 mg total) by mouth 2 (two) times daily.  . [DISCONTINUED] lamoTRIgine (LAMICTAL) 150 MG tablet TAKE 1 TABLET BY MOUTH DAILY.     Past Psychiatric History/Hospitalization(s): Patient has been seeing in this office since 2009.  She was admitted at [facility] due to suicidal attempt when she took overdose on her medication.  At that time she was taking Prozac which was stopped.   Anxiety: Yes Bipolar Disorder: No Depression: Yes Mania: No Psychosis: Yes Schizophrenia: No Personality Disorder: No Hospitalization for psychiatric illness: Yes History of Electroconvulsive Shock Therapy: No Prior Suicide Attempts: Yes  Physical Exam: Constitutional:  BP 134/76 mmHg  Pulse 100  Ht 5\' 5"  (1.651 m)  Wt 175 lb (79.379 kg)  BMI 29.12 kg/m2  General Appearance: well nourished.    Musculoskeletal: Strength & Muscle Tone: within normal limits Gait & Station: normal Patient leans: Patient has a normal posture  Mental Status Examination: Patient is casually dressed and fairly groomed.  She is emotional but cooperative.  She maintained good eye contact.  She described her mood emotional and her affect is mood appropriate.  She denies any auditory or visual hallucination.  She denies any paranoia , delusion or any obsessive thoughts.  She denies any active or passive suicidal parts or homicidal thought.  She has no tremors, shakes or any EPS.  Her fund of knowledge is adequate.  She is alert and oriented x3.  Her psychomotor activity is normal.  Her insight judgment and impulse control is okay.  Established Problem, Stable/Improving (1), Review of Psycho-Social Stressors (1), New Problem, with no additional work-up planned (3), Review of Last Therapy Session (1) and Review of Medication Regimen & Side Effects (2)  Assessment: Axis I: Maj. depressive disorder  Axis II: Deferred  Axis III: See medical history  Plan:  Patient is fairly stable on her medication however she continues to have episodes of crying spells, irritability and depression.  She does not feel that medicine needs to be change but admitted seeing a therapist on a regular basis is helping her.  We talk about applying for a job or getting any volunteer work to keep herself  busy.  At this time patient does not have any side effects of medication.  I will continue Klonopin at bedtime, Wellbutrin XL 150 mg daily, Lamictal 150 mg daily, Abilify 30 mg daily .  Discussed to stop drinking and talk about interaction with psychotropic medication and delayed recovery.  Recommended to call us back if she has any question, concern or if she feels worsening of the symptom.  Follow-up in 2 months.   Shenique Childers T., MD 06/04/2014

## 2014-06-24 ENCOUNTER — Other Ambulatory Visit (HOSPITAL_COMMUNITY): Payer: Self-pay | Admitting: Psychiatry

## 2014-06-24 NOTE — Telephone Encounter (Signed)
Declined refills of patient's requested Abilify, Lamictal and Wellbutrin as all new orders were e-scribed to [facility] on 06/04/14 plus one refill of each.

## 2014-06-26 ENCOUNTER — Telehealth (HOSPITAL_COMMUNITY): Payer: Self-pay

## 2014-06-26 NOTE — Telephone Encounter (Signed)
I returned phone call and left a message. 

## 2014-07-03 ENCOUNTER — Ambulatory Visit (INDEPENDENT_AMBULATORY_CARE_PROVIDER_SITE_OTHER): Payer: BLUE CROSS/BLUE SHIELD | Admitting: Psychiatry

## 2014-07-03 ENCOUNTER — Encounter (HOSPITAL_COMMUNITY): Payer: Self-pay | Admitting: Psychiatry

## 2014-07-03 DIAGNOSIS — F1021 Alcohol dependence, in remission: Secondary | ICD-10-CM

## 2014-07-03 DIAGNOSIS — F329 Major depressive disorder, single episode, unspecified: Secondary | ICD-10-CM

## 2014-07-03 DIAGNOSIS — F331 Major depressive disorder, recurrent, moderate: Secondary | ICD-10-CM

## 2014-07-03 NOTE — Progress Notes (Signed)
[facility] Progress Note  Donna Black [id number] 49 y.o.  07/03/2014 3:56 PM  Chief Complaint:  I want to get some advice from you.    History of Present Illness: Donna Black came earlier than her scheduled appointment.  She mentioned that her husband is giving a hard time in custody battle and couple may go to mediation .  She might need a letter from [name] to help the custody of her son.  Patient has decided to get separated from her husband but currently living in the same house because she can see her son more frequently.  She is taking her medication as prescribed.  She denies any irritability, anger, mood swing.  Her sleep is good.  She denies any panic attack.  She admitted since she had made the decision for separation she is more relaxed .  She is not drinking alcohol and not using any drugs.  She denies any crying spells or any feeling of hopelessness.  She is seeing Hulen Luster and admitted some time he has given challenging task as a homework and she feels proud that she's been doing very well.  She denies any mania or any psychosis.  Patient denied that she ever been abusive towards her son.  She is also involving her daughter and brother to be involved in her decision and she had a good support from them.  She is happy that her father is doing better.  Patient wants to continue her current medication.  Suicidal Ideation: No Plan Formed: No Patient has means to carry out plan: No  Homicidal Ideation: No Plan Formed: No Patient has means to carry out plan: No  Review of Systems  Constitutional: Negative.   HENT: Negative.   Respiratory: Negative.   Cardiovascular: Negative for chest pain and palpitations.  Skin: Negative for itching and rash.  Neurological: Negative for dizziness and tremors.   Psychiatric: Agitation: No Hallucination: No Depressed Mood: No Insomnia: No Hypersomnia: No Altered Concentration: No Feels Worthless: No Grandiose Ideas:  No Belief In Special Powers: No New/Increased Substance Abuse: No Compulsions: No  Neurologic: Headache: No Seizure: No Paresthesias: No  Past Medical History  Diagnosis Date  . Wears glasses   . Depression   . Heart murmur     told off and on in past that she has had one  . Grave's disease     diagnosed about 7 yrs ago but after a year she has been doing well  . Anemia     when she was younger  . Complication of anesthesia     ?, heart rate dropped w/wisdom teeth  . Cancer 2013    bilateral breast cancer  . Pneumonia   . GERD (gastroesophageal reflux disease)     with pregnancy only  . High cholesterol    She see Dr Alessandra Grout at [facility].   Social History:  Patient lives with her husband.  Outpatient Encounter Prescriptions as of 07/03/2014  Medication Sig  . ARIPiprazole (ABILIFY) 30 MG tablet Take 1 tablet (30 mg total) by mouth daily.  Marland Kitchen aspirin EC 325 MG tablet Take 1 tablet (325 mg total) by mouth 2 (two) times daily after a meal.  . buPROPion (WELLBUTRIN XL) 150 MG 24 hr tablet TAKE 1 TABLET BY MOUTH EVERY MORNING  . clonazePAM (KLONOPIN) 0.5 MG tablet Take 1 tablet (0.5 mg total) by mouth 2 (two) times daily.  Marland Kitchen lamoTRIgine (LAMICTAL) 150 MG tablet TAKE 1 TABLET BY MOUTH DAILY.  No facility-administered encounter medications on file as of 07/03/2014.     Past Psychiatric History/Hospitalization(s): Patient has been seeing in this office since 2009.  She was admitted at [facility] due to suicidal attempt when she took overdose on her medication.  At that time she was taking Prozac which was stopped.   Anxiety: Yes Bipolar Disorder: No Depression: Yes Mania: No Psychosis: Yes Schizophrenia: No Personality Disorder: No Hospitalization for psychiatric illness: Yes History of Electroconvulsive Shock Therapy: No Prior Suicide Attempts: Yes  Physical Exam: Constitutional:  There were no vitals taken for this visit.  General  Appearance: well nourished.    Musculoskeletal: Strength & Muscle Tone: within normal limits Gait & Station: normal Patient leans: Patient has a normal posture  Mental Status Examination: Patient is casually dressed and fairly groomed.  She is emotional but cooperative.  She maintained good eye contact.  She described her mood euthymic and her affect is appropriate.   She denies any auditory or visual hallucination.  She denies any paranoia , delusion or any obsessive thoughts.  She denies any active or passive suicidal parts or homicidal thought.  She has no tremors, shakes or any EPS.  Her fund of knowledge is adequate.  She is alert and oriented x3.  Her psychomotor activity is normal.  Her insight judgment and impulse control is okay.  Established Problem, Stable/Improving (1), Review of Psycho-Social Stressors (1), New Problem, with no additional work-up planned (3), Review of Last Therapy Session (1) and Review of Medication Regimen & Side Effects (2)  Assessment: Axis I: Maj. 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  We have reviewed the records and I do believe patient is more calm and cooperative since she had decided to get separation from her husband.  As per chart patient has no history of abuse towards her son.  Patient is going for mediation with her husband to get custody issue resolved and may require a letter to support that she has been consistent with medication , treatment, and follow-up.  Patient will call us if she needs a letter.  At this time I will not change any medication.  She is feeling better.  She like to come in 4 weeks.  I recommended to call us back if she has any question, concern or if she feel worsening of the symptom.  Discuss safety concern that anytime having suicidal thoughts or homicidal thought that she need to call  911 or go to the local emergency room.  At this time patient does not have any side effects of medication.  I will continue Klonopin at bedtime, Wellbutrin XL 150 mg daily, Lamictal 150 mg daily, Abilify 30 mg daily .     Markel Mergenthaler T., MD 07/03/2014

## 2014-08-05 ENCOUNTER — Encounter (HOSPITAL_COMMUNITY): Payer: Self-pay | Admitting: Psychiatry

## 2014-08-05 ENCOUNTER — Ambulatory Visit (INDEPENDENT_AMBULATORY_CARE_PROVIDER_SITE_OTHER): Payer: BLUE CROSS/BLUE SHIELD | Admitting: Psychiatry

## 2014-08-05 VITALS — BP 146/94 | HR 86 | Ht 65.0 in | Wt 154.0 lb

## 2014-08-05 DIAGNOSIS — F331 Major depressive disorder, recurrent, moderate: Secondary | ICD-10-CM

## 2014-08-05 DIAGNOSIS — F1021 Alcohol dependence, in remission: Secondary | ICD-10-CM

## 2014-08-05 DIAGNOSIS — F329 Major depressive disorder, single episode, unspecified: Secondary | ICD-10-CM

## 2014-08-05 MED ORDER — CLONAZEPAM 0.5 MG PO TABS: 0.5000 mg | ORAL_TABLET | Freq: Two times a day (BID) | ORAL | Status: DC

## 2014-08-05 MED ORDER — ARIPIPRAZOLE 30 MG PO TABS: 30.0000 mg | ORAL_TABLET | Freq: Every day | ORAL | Status: DC

## 2014-08-05 MED ORDER — BUPROPION HCL ER (XL) 150 MG PO TB24: ORAL_TABLET | ORAL | Status: DC

## 2014-08-05 MED ORDER — LAMOTRIGINE 150 MG PO TABS: ORAL_TABLET | ORAL | Status: DC

## 2014-08-05 NOTE — Progress Notes (Signed)
[facility] Progress Note  Donna Black [id number] 49 y.o.  08/05/2014 4:31 PM  Chief Complaint:  Medication management and follow-up.     History of Present Illness: Donna Black came for her follow-up appointment.  She is separated from her husband and left the house more than a week ago.  She is feeling much calmer and relaxed.  She initially stay in the hotel and now with her cousin and father.  Patient told her husband is paying for her to stay.  Patient told her husband is trying for mediation and she is working with a Chief Executive Officer.  Overall she reported her anxiety depression is under control.  She sleeping good.  She is hoping to cut down her Klonopin the night because she sleeps very well and wondering if she can cut down the Klonopin.  She denies any feeling of hopelessness or worthlessness.  First few days she was tearful but denies any feeling of hopelessness or worthlessness.  She is seeing Hulen Luster weekly which is helping her coping skills.  Patient denies drinking or using any illegal substances.  She is visiting almost every day her son and taking him to soccer practice.  Her energy level is good.  She denies any paranoia or any hallucination.  She denies any aggressive behavior.  Patient has a good support from her family.  Her appetite is okay.  Her vitals are stable.  Suicidal Ideation: No Plan Formed: No Patient has means to carry out plan: No  Homicidal Ideation: No Plan Formed: No Patient has means to carry out plan: No  Review of Systems  Constitutional: Negative.   HENT: Negative.   Respiratory: Negative.   Cardiovascular: Negative for chest pain and palpitations.  Skin: Negative for itching and rash.  Neurological: Negative for dizziness and tremors.   Psychiatric: Agitation: No Hallucination: No Depressed Mood: No Insomnia: No Hypersomnia: No Altered Concentration: No Feels Worthless: No Grandiose Ideas: No Belief In Special Powers:  No New/Increased Substance Abuse: No Compulsions: No  Neurologic: Headache: No Seizure: No Paresthesias: No  Past Medical History  Diagnosis Date  . Wears glasses   . Depression   . Heart murmur     told off and on in past that she has had one  . Grave's disease     diagnosed about 7 yrs ago but after a year she has been doing well  . Anemia     when she was younger  . Complication of anesthesia     ?, heart rate dropped w/wisdom teeth  . Cancer 2013    bilateral breast cancer  . Pneumonia   . GERD (gastroesophageal reflux disease)     with pregnancy only  . High cholesterol    She see Dr Alessandra Grout at [facility].   Social History:  Patient lives with her husband.  Outpatient Encounter Prescriptions as of 08/05/2014  Medication Sig  . ARIPiprazole (ABILIFY) 30 MG tablet Take 1 tablet (30 mg total) by mouth daily.  Marland Kitchen aspirin EC 325 MG tablet Take 1 tablet (325 mg total) by mouth 2 (two) times daily after a meal.  . buPROPion (WELLBUTRIN XL) 150 MG 24 hr tablet TAKE 1 TABLET BY MOUTH EVERY MORNING  . clonazePAM (KLONOPIN) 0.5 MG tablet Take 1 tablet (0.5 mg total) by mouth 2 (two) times daily.  Marland Kitchen lamoTRIgine (LAMICTAL) 150 MG tablet TAKE 1 TABLET BY MOUTH DAILY.  . [DISCONTINUED] ARIPiprazole (ABILIFY) 30 MG tablet Take 1 tablet (30 mg total) by  mouth daily.  . [DISCONTINUED] buPROPion (WELLBUTRIN XL) 150 MG 24 hr tablet TAKE 1 TABLET BY MOUTH EVERY MORNING  . [DISCONTINUED] clonazePAM (KLONOPIN) 0.5 MG tablet Take 1 tablet (0.5 mg total) by mouth 2 (two) times daily.  . [DISCONTINUED] lamoTRIgine (LAMICTAL) 150 MG tablet TAKE 1 TABLET BY MOUTH DAILY.   No facility-administered encounter medications on file as of 08/05/2014.     Past Psychiatric History/Hospitalization(s): Patient has been seeing in this office since 2009.  She was admitted at [facility] due to suicidal attempt when she took overdose on her medication.  At that time she was  taking Prozac which was stopped.   Anxiety: Yes Bipolar Disorder: No Depression: Yes Mania: No Psychosis: Yes Schizophrenia: No Personality Disorder: No Hospitalization for psychiatric illness: Yes History of Electroconvulsive Shock Therapy: No Prior Suicide Attempts: Yes  Physical Exam: Constitutional:  BP 146/94 mmHg  Pulse 86  Ht 5\' 5"  (1.651 m)  Wt 154 lb (69.854 kg)  BMI 25.63 kg/m2  General Appearance: well nourished.    Musculoskeletal: Strength & Muscle Tone: within normal limits Gait & Station: normal Patient leans: Patient has a normal posture  Mental Status Examination: Patient is casually dressed and fairly groomed.  She is pleasant and cooperative.  She maintained good eye contact.  She described her mood euthymic and her affect is appropriate.   She denies any auditory or visual hallucination.  She denies any paranoia , delusion or any obsessive thoughts.  She denies any active or passive suicidal parts or homicidal thought.  She has no tremors, shakes or any EPS.  Her fund of knowledge is adequate.  She is alert and oriented x3.  Her psychomotor activity is normal.  Her insight judgment and impulse control is okay.  Established Problem, Stable/Improving (1), Review of Psycho-Social Stressors (1), Review of Last Therapy Session (1) and Review of Medication Regimen & Side Effects (2)  Assessment: Axis I: Maj. 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  Since she had decided to leave her husband she is more relaxed.  She is taking Abilify 30 mg daily, Wellbutrin 150 mg daily, Klonopin 0.5 mg in the morning and at bedtime as needed and Lamictal 150 mg daily.  She has no rash or itching.  Discussed medication side effects and benefits.  Discussed to continue therapy with Waymon Budge.  Recommended to call us back if she has any question, concern or if  she feels worsening of the symptom.  Follow-up in 2 months.   ARFEEN,SYED T., MD 08/05/2014

## 2014-09-05 ENCOUNTER — Other Ambulatory Visit (HOSPITAL_COMMUNITY)
Admission: RE | Admit: 2014-09-05 | Discharge: 2014-09-05 | Disposition: A | Discharge status: Home / Self Care | Payer: BLUE CROSS/BLUE SHIELD | Source: Ambulatory Visit | Attending: Family Medicine | Admitting: Family Medicine

## 2014-09-05 ENCOUNTER — Other Ambulatory Visit: Payer: Self-pay | Admitting: Family Medicine

## 2014-09-05 DIAGNOSIS — Z1151 Encounter for screening for human papillomavirus (HPV): Secondary | ICD-10-CM | POA: Diagnosis present

## 2014-09-05 DIAGNOSIS — Z01411 Encounter for gynecological examination (general) (routine) with abnormal findings: Secondary | ICD-10-CM | POA: Insufficient documentation

## 2014-09-09 LAB — CYTOLOGY - PAP

## 2014-10-02 ENCOUNTER — Telehealth (HOSPITAL_COMMUNITY): Payer: Self-pay

## 2014-10-02 ENCOUNTER — Encounter (HOSPITAL_COMMUNITY): Payer: Self-pay | Admitting: Emergency Medicine

## 2014-10-02 ENCOUNTER — Emergency Department (HOSPITAL_COMMUNITY)
Admission: EM | Admit: 2014-10-02 | Discharge: 2014-10-03 | Disposition: A | Discharge status: Other Acute Inpatient Hospital | Payer: BLUE CROSS/BLUE SHIELD | Attending: Emergency Medicine | Admitting: Emergency Medicine

## 2014-10-02 DIAGNOSIS — Z853 Personal history of malignant neoplasm of breast: Secondary | ICD-10-CM | POA: Diagnosis not present

## 2014-10-02 DIAGNOSIS — F329 Major depressive disorder, single episode, unspecified: Secondary | ICD-10-CM | POA: Diagnosis not present

## 2014-10-02 DIAGNOSIS — Z8719 Personal history of other diseases of the digestive system: Secondary | ICD-10-CM | POA: Diagnosis not present

## 2014-10-02 DIAGNOSIS — Z862 Personal history of diseases of the blood and blood-forming organs and certain disorders involving the immune mechanism: Secondary | ICD-10-CM | POA: Insufficient documentation

## 2014-10-02 DIAGNOSIS — Z880 Allergy status to penicillin: Secondary | ICD-10-CM | POA: Diagnosis not present

## 2014-10-02 DIAGNOSIS — Z8701 Personal history of pneumonia (recurrent): Secondary | ICD-10-CM | POA: Diagnosis not present

## 2014-10-02 DIAGNOSIS — Z87891 Personal history of nicotine dependence: Secondary | ICD-10-CM | POA: Diagnosis not present

## 2014-10-02 DIAGNOSIS — Z8639 Personal history of other endocrine, nutritional and metabolic disease: Secondary | ICD-10-CM | POA: Diagnosis not present

## 2014-10-02 DIAGNOSIS — Z79899 Other long term (current) drug therapy: Secondary | ICD-10-CM | POA: Insufficient documentation

## 2014-10-02 DIAGNOSIS — R011 Cardiac murmur, unspecified: Secondary | ICD-10-CM | POA: Diagnosis not present

## 2014-10-02 DIAGNOSIS — R45851 Suicidal ideations: Secondary | ICD-10-CM | POA: Diagnosis present

## 2014-10-02 DIAGNOSIS — F32A Depression, unspecified: Secondary | ICD-10-CM

## 2014-10-02 DIAGNOSIS — F22 Delusional disorders: Secondary | ICD-10-CM | POA: Insufficient documentation

## 2014-10-02 LAB — CBC WITH DIFFERENTIAL/PLATELET
BASOS ABS: 0 10*3/uL (ref 0.0–0.1)
Basophils Relative: 0 % (ref 0–1)
EOS PCT: 7 % — AB (ref 0–5)
Eosinophils Absolute: 0.6 10*3/uL (ref 0.0–0.7)
HCT: 44 % (ref 36.0–46.0)
Hemoglobin: 15.2 g/dL — ABNORMAL HIGH (ref 12.0–15.0)
Lymphocytes Relative: 25 % (ref 12–46)
Lymphs Abs: 2.1 10*3/uL (ref 0.7–4.0)
MCH: 33.3 pg (ref 26.0–34.0)
MCHC: 34.5 g/dL (ref 30.0–36.0)
MCV: 96.3 fL (ref 78.0–100.0)
MONO ABS: 0.8 10*3/uL (ref 0.1–1.0)
Monocytes Relative: 9 % (ref 3–12)
Neutro Abs: 5.1 10*3/uL (ref 1.7–7.7)
Neutrophils Relative %: 59 % (ref 43–77)
Platelets: 288 10*3/uL (ref 150–400)
RBC: 4.57 MIL/uL (ref 3.87–5.11)
RDW: 12.8 % (ref 11.5–15.5)
WBC: 8.6 10*3/uL (ref 4.0–10.5)

## 2014-10-02 LAB — RAPID URINE DRUG SCREEN, HOSP PERFORMED
Amphetamines: NOT DETECTED
BARBITURATES: NOT DETECTED
BENZODIAZEPINES: NOT DETECTED
Cocaine: NOT DETECTED
Opiates: NOT DETECTED
Tetrahydrocannabinol: NOT DETECTED

## 2014-10-02 LAB — COMPREHENSIVE METABOLIC PANEL
ALBUMIN: 4.5 g/dL (ref 3.5–5.0)
ALT: 21 U/L (ref 14–54)
AST: 27 U/L (ref 15–41)
Alkaline Phosphatase: 41 U/L (ref 38–126)
Anion gap: 9 (ref 5–15)
BUN: 12 mg/dL (ref 6–20)
CALCIUM: 9.7 mg/dL (ref 8.9–10.3)
CO2: 26 mmol/L (ref 22–32)
Chloride: 103 mmol/L (ref 101–111)
Creatinine, Ser: 0.74 mg/dL (ref 0.44–1.00)
GFR calc Af Amer: >60 mL/min (ref >60)
GFR calc non Af Amer: >60 mL/min (ref >60)
Glucose, Bld: 143 mg/dL — ABNORMAL HIGH (ref 65–99)
Potassium: 3.8 mmol/L (ref 3.5–5.1)
Sodium: 138 mmol/L (ref 135–145)
Total Bilirubin: 0.3 mg/dL (ref 0.3–1.2)
Total Protein: 8 g/dL (ref 6.5–8.1)

## 2014-10-02 LAB — ETHANOL

## 2014-10-02 NOTE — ED Provider Notes (Signed)
CSN: [id number]     Arrival date & time 10/02/14  1712 History   None    Chief Complaint  Patient presents with  . IVC    . Suicidal     (Consider location/radiation/quality/duration/timing/severity/associated sxs/prior Treatment) Patient is a 49 y.o. female presenting with mental health disorder.  Mental Health Problem Presenting symptoms: depression and suicidal thoughts   Presenting symptoms: no hallucinations   Onset quality:  Sudden Timing:  Constant Progression:  Worsening Chronicity:  Recurrent Context: not alcohol use and not medication   Treatment compliance:  Untreated Relieved by:  None tried Worsened by:  Nothing tried Ineffective treatments:  None tried Associated symptoms: no abdominal pain, no anxiety, no chest pain, no headaches, no insomnia and no irritability     Past Medical History  Diagnosis Date  . Wears glasses   . Depression   . Heart murmur     told off and on in past that she has had one  . Grave's disease     diagnosed about 7 yrs ago but after a year she has been doing well  . Anemia     when she was younger  . Complication of anesthesia     ?, heart rate dropped w/wisdom teeth  . Cancer 2013    bilateral breast cancer  . Pneumonia   . GERD (gastroesophageal reflux disease)     with pregnancy only  . High cholesterol    Past Surgical History  Procedure Laterality Date  . Cholecystectomy    . Wisdom tooth extraction    . Breast fibroadenoma surgery  1987, 2000    bilateral  . Breast reconstruction  05/12/2011    Procedure: BREAST RECONSTRUCTION;  Surgeon: Crissie Reese, MD;  Location: [place];  Service: Plastics;  Laterality: Bilateral;  PLACEMENT OF BILATERAL TISSUE EXPANDERS WITH POSSIBLE USE OF HD FLEX  . Cesarean section      2001,cyst; 2002 birth  . Orif ankle fracture Right 09/18/2013    Procedure: OPEN REDUCTION INTERNAL FIXATION (ORIF) RIGHT ANKLE FRACTURE;  Surgeon: Mcarthur Rossetti, MD;  Location: [place];  Service:  Orthopedics;  Laterality: Right;   Family History  Problem Relation Age of Onset  . Breast cancer Maternal Grandmother   . Cancer Maternal Grandmother     breast  . Heart disease Mother   . Stroke Mother   . COPD Mother   . Diabetes Mother   . Heart disease Father    Social History  Substance Use Topics  . Smoking status: Former Smoker -- 1.00 packs/day    Types: Cigarettes    Quit date: 09/16/2013  . Smokeless tobacco: Never Used  . Alcohol Use: 1.2 oz/week    2 Glasses of wine per week     Comment: sometimes daily or can just be weekends   OB History    No data available     Review of Systems  Constitutional: Negative for chills and irritability.  HENT: Negative for drooling and ear pain.   Eyes: Negative for pain.  Respiratory: Negative for shortness of breath.   Cardiovascular: Negative for chest pain.  Gastrointestinal: Negative for abdominal pain.  Endocrine: Negative for polydipsia and polyphagia.  Genitourinary: Negative for dysuria.  Neurological: Negative for headaches.  Psychiatric/Behavioral: Positive for suicidal ideas. Negative for hallucinations. The patient is not nervous/anxious and does not have insomnia.   All other systems reviewed and are negative.     Allergies  Clindamycin/lincomycin and Penicillins  Home Medications   Prior  to Admission medications   Medication Sig Start Date End Date Taking? Authorizing Provider  buPROPion (WELLBUTRIN XL) 150 MG 24 hr tablet TAKE 1 TABLET BY MOUTH EVERY MORNING Patient taking differently: Take 150 mg by mouth daily.  08/05/14  Yes [name], MD  clonazePAM (KLONOPIN) 0.5 MG tablet Take 1 tablet (0.5 mg total) by mouth 2 (two) times daily. 08/05/14  Yes [name], MD  lamoTRIgine (LAMICTAL) 150 MG tablet TAKE 1 TABLET BY MOUTH DAILY. Patient taking differently: Take 150 mg by mouth daily.  08/05/14  Yes [name], MD  terbinafine (LAMISIL) 250 MG tablet Take 250 mg by mouth daily. 09/05/14  Yes  Historical Provider, MD  ARIPiprazole (ABILIFY) 30 MG tablet Take 1 tablet (30 mg total) by mouth daily. Patient not taking: Reported on 10/02/2014 08/05/14   [name], MD  aspirin EC 325 MG tablet Take 1 tablet (325 mg total) by mouth 2 (two) times daily after a meal. Patient not taking: Reported on 10/02/2014 09/19/13   Mcarthur Rossetti, MD   BP 144/67 mmHg  Pulse 80  Temp(Src) 98.2 F (36.8 C) (Oral)  Resp 16  SpO2 99% Physical Exam  Constitutional: She is oriented to person, place, and time. She appears well-developed and well-nourished.  HENT:  Head: Normocephalic and atraumatic.  Eyes: Conjunctivae and EOM are normal. Right eye exhibits no discharge. Left eye exhibits no discharge.  Cardiovascular: Normal rate and regular rhythm.   Pulmonary/Chest: Effort normal and breath sounds normal. No respiratory distress.  Abdominal: Soft. She exhibits no distension. There is no tenderness. There is no rebound.  Musculoskeletal: Normal range of motion. She exhibits no edema or tenderness.  Neurological: She is alert and oriented to person, place, and time.  Skin: Skin is warm and dry.  Nursing note and vitals reviewed.   ED Course  Procedures (including critical care time) Labs Review Labs Reviewed  CBC WITH DIFFERENTIAL/PLATELET - Abnormal; Notable for the following:    Hemoglobin 15.2 (*)    Eosinophils Relative 7 (*)    All other components within normal limits  COMPREHENSIVE METABOLIC PANEL - Abnormal; Notable for the following:    Glucose, Bld 143 (*)    All other components within normal limits  URINE RAPID DRUG SCREEN, HOSP PERFORMED  ETHANOL    Imaging Review No results found.   EKG Interpretation None      MDM   Final diagnoses:  None   He 68-year-old female here at the request of her psychiatrist for voluntary commitment however she now does not want voluntary commitment. I spoke with the admitting team at behavioral health and they had high concernof  the conversations with her that she was suicidal. Also discussed with her daughter who is concerned for her safety as well. Secondary to his concerns I did perform IVC paperwork and medically cleared her for TTS. Patient is minimalized at her suicidality now    Merrily Pew, MD 10/02/14 [id number]

## 2014-10-02 NOTE — ED Notes (Signed)
Went to collect blood samples RN requst thay I wait to collect samples

## 2014-10-02 NOTE — ED Notes (Signed)
Pt brought in by [facility] from [facility] for medical clearance.   Pt states that she isnt SI and wants to leave. Pt will not let vital signs, lab work, being dressed out into paper scrubs to be done until she "has a court order in front of her".   Dr Dolly Rias spoke with pt, [facility] and pt's daughter.  Dr Dolly Rias will be filling out IVC papers on pt as too many people feel she is a threat to herself if she were to leave here.  Pt demanding to sign a HIPPA form.  Obtained one from registration and got pt to sign.

## 2014-10-02 NOTE — Telephone Encounter (Signed)
Patient came in as a walk-in without her appointment requesting to refill her Klonopin because she lost the prescription.  Earlier information obtained from her daughter that patient is not doing very well.  She has extended her daughter mentioning about severe depression and on the phone and she mentioned to her daughter about suicidal thoughts.  Patient appears somewhat strange, paranoid and refuses to talk.  Patient was taken to the assessment for further evaluation.

## 2014-10-02 NOTE — Telephone Encounter (Signed)
Medicaiton management -  Called patient's daughter, Donna Black back after her sister, Donna Black had left a message with concerns for patient's deterioating symptoms.  Reported her Mother was depressed after loosing custody of son and divorced stepfather. Donna Black reported they were concerned Donna Black reported she did not have one of her prescriptions and was acting more strange and paranoid like she had previously when she attempted to harm herself.  Reports patient keeps telling them she is not going to harm herself after one time admission of suicidal thoughts on 10/01/14.  Called patient who had left a message earlier today reporting she had lost her Clonazepam prescription and was in need of a refill.  Left patient a message as she did not answer stating concerns her daughter's had shared from voicemail and telephone call with their report of concerns.  Requested patient call back to verify her safety and to assist with new orders and possible needed earlier evaluation or emergency care.  Patient then showed up at our office at this time and requested to see Donna Black and to get a refill of her Clonazepam.  Spoke with Donna Black as patient presented with reserved demeanor, reported level mood but admitted she needed to be seen or at least to have "my medication refilled".  Informed Donna Black or daughter Donna Black concerns patient not doing as well and had expressed suicidal thoughts 09/30/08.  Informed patient's daughter shared patient has separated from their stepfather and had now lost custody of her son.  Discussed need for patient to be seen even though patient not scheduled.  Donna Black called and spoke with patient's daughter, Donna Black as patient had informed this nurse she was fine with any communication with her daughters.  Donna Black, patient's other daughter shared concerns with Donna Black that they were worried about patient's safety after a strange text and SI undertones with thoughts she had shared with them on 09/30/08.   Donna Black shared with Donna Black they would like him to assess patient and agreed they needed to make sure patient stayed safe.  Donna Black then met with patient who kept saying "I am okay" and denied suicidality but refused to be evaluated further by TTS for possible need of inpatient.  Donna Black and this nurse shared concerns for patient's safety and called the police as Donna Black had to inform patient we were not comfortable with her leaving without an assessment as she admitted to numerous stressors and did not have a good safety plan in place considering all that had been reported and admitted to by patient this date.  Police were called and then patient agreed to be assessed by TTS.  Donna Black walked patient up and also spoke with Dr. Sabra Heck about patient's reported status who agreed to evaluate for petition if recommended and patient would not sign in voluntarily.  Officer Producer, television/film/video with the [facility] police agreed to check back in to make sure patient did not leave without an evaluation but reminded they could not do anything unless patient placed under petition.  Patient left in TTS for them to make final decision and to have Dr. Sabra Heck if patient needed an evaluation for possible petition.  Patient was in agreement to be evaluated and then left messages for both of patient's daughters, per Donna Black that patient was being evaluated and they could call there to provide any additional information if needed.

## 2014-10-02 NOTE — BH Assessment (Addendum)
Tele Assessment Note   Donna Black is an 49 y.o. female who presented to [facility] walk in from outpatient services at the advice of her psychiatrist.  Patient reported being treated for her depression by Dr. Adele Schilder.  She also reported attending outpatient services at [facility] for the past 7 years.  She stated that she discovered this morning that she had lost her bottle of anxiety medication and so she asked to speak to Dr. Adele Schilder so that she could acquire another bottle.    Patient stated that she is currently separated and going through a divorce.  She reported that her husband has custody of her 82 year old son and that she has two grown daughters who live in [place].  She appeared confused at answering most questions many times asking "What do you mean" and would frequently refuse to answer the questions.  She admitted to past suicide attempts where she had overdosed on her medications but denied that she was suicidal today.  She did state that she told her daughters recently that she had begun to feel suicidal but denied any plans.  She denied any hallucinations, history of abuse, homicidal ideations or history of aggression.  She stated that she used to drink alcohol where she would not remember things but that she had not been drinking for awhile.  She would not answer what "awhile" meant. She refused to discuss any family history of suicide and stated that she felt she "needed to stop talking for awhile".  Patient also stated that she was "taking a break from therapy."  Collaterals obtained from patient's grown daughter revealed that patient has been "paranoid and delusional" for the past seven years.  Her daughter stated that patient does not tell her psychiatrist about her delusions but does confide in her two grown daughters.  Patient's daughter reported that patient believes that she is being watched through her TV and computer and refuses to have a TV in her home.  Patient's daughter  reported that patient believes that people are following her and are watching everything she does.  She has accused her daughter of working for her ex husband and helping to film her.  Patient's daughter reported that patient lost custody of her 84 year old son and that she has no friends.    Patient's daughter reported that patient did admit to her that she has been suicidal lately and today she sent a text message to her, her sister and her brother saying that she loved them and was sorry.  Patient's daughter stated that the last time patient attempted suicide she sent a similar text message. She is concerned because patient is currently living alone.  Consulted with Dr. Sabra Heck who recommended in patient treatment.    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 Wears glasses   . Depression   . Heart murmur     told off and on in past that she has had one  . Grave's disease     diagnosed about 7 yrs ago but after a year she has been doing well  . Anemia     when  she was younger  . Complication of anesthesia     ?, heart rate dropped w/wisdom teeth  . Cancer 2013    bilateral breast cancer  . Pneumonia   . GERD (gastroesophageal reflux disease)     with pregnancy only  . High cholesterol     Past Surgical History  Procedure Laterality Date  . Cholecystectomy    . Wisdom tooth extraction    . Breast fibroadenoma surgery  1987, 2000    bilateral  . Breast reconstruction  05/12/2011    Procedure: BREAST RECONSTRUCTION;  Surgeon: Crissie Reese, MD;  Location: [place];  Service: Plastics;  Laterality: Bilateral;  PLACEMENT OF BILATERAL TISSUE EXPANDERS WITH POSSIBLE USE OF HD FLEX  . Cesarean section      2001,cyst; 2002 birth  . Orif ankle fracture Right 09/18/2013    Procedure: OPEN REDUCTION INTERNAL FIXATION (ORIF) RIGHT ANKLE FRACTURE;  Surgeon: Mcarthur Rossetti, MD;  Location: [place];  Service: Orthopedics;  Laterality: Right;    Family History:  Family History  Problem Relation Age of Onset  . Breast cancer Maternal Grandmother   . Cancer Maternal Grandmother     breast  . Heart disease Mother   . Stroke Mother   . COPD Mother   . Diabetes Mother   . Heart disease Father     Social History:  reports that she quit smoking about 12 months ago. Her smoking use included Cigarettes. She smoked 1.00 pack per day. She has never used smokeless tobacco. She reports that she drinks about 1.2 oz of alcohol per week. She reports that she does not use illicit drugs.  Additional Social History:     CIWA: CIWA-Ar BP: (!) 173/104 mmHg Pulse Rate: 90 COWS:    PATIENT STRENGTHS: (choose at least two) Physical Health Supportive family/friends  Allergies:  Allergies  Allergen Reactions  . Clindamycin/Lincomycin Anaphylaxis  . Penicillins Other (See Comments)    Did not work as a child    Home Medications:  (Not in a hospital admission)  OB/GYN Status:  No LMP recorded.        Crisis Care Plan Name of Psychiatrist:  (Dr. Adele Schilder) Name of Therapist:  [facility])  Education Status Is patient currently in school?: No Current Grade:  (n/a) Highest grade of school patient has completed:  (12th grade) Name of school:  (n/a) Contact person:  (n/a)  Risk to self with the past 6 months Suicidal Ideation: Yes-Currently Present Has patient been a risk to self within the past 6 months prior to admission? : Yes Suicidal Intent: Yes-Currently Present Has patient had any suicidal intent  within the past 6 months prior to admission? : Yes Is patient at risk for suicide?: Yes Suicidal Plan?: No Has patient had any suicidal plan within the past 6 months prior to admission? : Yes Access to Means: Yes Specify Access to Suicidal Means:  (pt has many prescriptions at home) What has been your use of drugs/alcohol within the last 12 months?:  (would not specify) Previous Attempts/Gestures: Yes How many times?:  (twice) Other Self Harm Risks:  (none) Triggers for Past Attempts: Family contact, Spouse contact Intentional Self Injurious Behavior: None Family Suicide History: Unknown Recent stressful life event(s): Divorce Persecutory voices/beliefs?: Yes Depression: Yes Depression Symptoms: Despondent, Tearfulness, Isolating, Loss of interest in usual pleasures Substance abuse history and/or treatment for substance abuse?: No Suicide prevention information given to non-admitted patients: Not applicable  Risk to Others within the past 6  months Homicidal Ideation: No Does patient have any lifetime risk of violence toward others beyond the six months prior to admission? : No Thoughts of Harm to Others: No Current Homicidal Intent: No Current Homicidal Plan: No Access to Homicidal Means: No Identified Victim:  (none) History of harm to others?: No Assessment of Violence: On admission Violent Behavior Description:  (none) Does patient have access to weapons?: No Criminal Charges Pending?: No Does patient have a court date: No Is patient on probation?: No  Psychosis Hallucinations: None noted Delusions: Persecutory  Mental Status Report Appearance/Hygiene: Unremarkable Eye Contact: Poor Motor Activity: Psychomotor retardation Speech: Argumentative Level of Consciousness: Combative, Irritable, Other (Comment) (confused) Mood: Suspicious, Angry, Irritable Affect: Angry Anxiety Level: Minimal Thought Processes: Tangential Judgement: Impaired Orientation: Person, Place,  Time, Situation Obsessive Compulsive Thoughts/Behaviors: Moderate  Cognitive Functioning Concentration: Decreased Memory: Recent Impaired, Remote Impaired IQ: Average Insight: Poor Impulse Control: Poor Appetite: Poor Weight Loss:  (none) Weight Gain:  (none) Sleep: Decreased Total Hours of Sleep:  (6-7 hrs) Vegetative Symptoms: Unable to Assess  ADLScreening [facility] Assessment Services) Patient's cognitive ability adequate to safely complete daily activities?: No Patient able to express need for assistance with ADLs?: Yes Independently performs ADLs?: Yes (appropriate for developmental age)  Prior Inpatient Therapy Prior Inpatient Therapy: Yes Prior Therapy Dates:  (two years ago) Prior Therapy Facilty/Provider(s):  [facility]) Reason for Treatment:  (depression suicide attempt)  Prior Outpatient Therapy Prior Outpatient Therapy: Yes Prior Therapy Dates:  (currently attending [place] outpatient) Prior Therapy Facilty/Provider(s):  ([place] outpatient) Reason for Treatment:  (depression) Does patient have an ACCT team?: No Does patient have Intensive In-House Services?  : No Does patient have [facility] services? : No Does patient have P4CC services?: No  ADL Screening (condition at time of admission) Patient's cognitive ability adequate to safely complete daily activities?: No Patient able to express need for assistance with ADLs?: Yes Independently performs ADLs?: Yes (appropriate for developmental age)             Regulatory affairs officer (For Healthcare) Does patient have an advance directive?: No Would patient like information on creating an advanced directive?: No - patient declined information    Additional Information 1:1 In Past 12 Months?: No CIRT Risk: No Elopement Risk: No Does patient have medical clearance?: Yes     Disposition:  Disposition Initial Assessment Completed for this Encounter: Yes Disposition of Patient: Inpatient treatment program Type  of inpatient treatment program: Adult  Carlean Jews 10/02/2014 7:20 PM

## 2014-10-02 NOTE — ED Notes (Signed)
Pt is refusing any labs at this time. RN is aware

## 2014-10-02 NOTE — ED Notes (Signed)
Bed: [facility] Expected date:  Expected time:  Means of arrival:  Comments: Tr 3

## 2014-10-02 NOTE — ED Notes (Signed)
Pt comes out to nurse's desk stating that she isn't going to have her blood work done and wants to know where the [facility] tech that rode over with her was bc she was ready to leave.  Pt refusing to stay. Made Will PA aware.

## 2014-10-02 NOTE — ED Notes (Addendum)
Patient appears anxious, cooperative.  Denies SI, HI, AVH. States that feelings of anxiety fluctuate. States "I thought about it this morning and decided I can control this situation. I called my daughters and told them not to worry about me". Patient became tearful when asked about feelings of depression. States "I started feeling depressed earlier when I thought about my kids and memories from the past". Patient reports decreased sleep overall in recent weeks. States sleep is interrupted at times and she feels tired during the day. States that she has dieted and fasted in recent weeks. No unexplained changes in weight or appetite.  Encouragement offered. Oriented to the unit. Snack provided.  Q 15 safety checks in place.

## 2014-10-03 ENCOUNTER — Encounter (HOSPITAL_COMMUNITY): Payer: Self-pay | Admitting: Nurse Practitioner

## 2014-10-03 ENCOUNTER — Inpatient Hospital Stay (HOSPITAL_COMMUNITY)
Admission: AD | Admit: 2014-10-03 | Discharge: 2014-10-09 | DRG: 885 | Disposition: A | Discharge status: Home / Self Care | Payer: BLUE CROSS/BLUE SHIELD | Attending: Psychiatry | Admitting: Psychiatry

## 2014-10-03 DIAGNOSIS — K219 Gastro-esophageal reflux disease without esophagitis: Secondary | ICD-10-CM | POA: Diagnosis present

## 2014-10-03 DIAGNOSIS — F333 Major depressive disorder, recurrent, severe with psychotic symptoms: Secondary | ICD-10-CM | POA: Diagnosis present

## 2014-10-03 DIAGNOSIS — Z823 Family history of stroke: Secondary | ICD-10-CM | POA: Diagnosis not present

## 2014-10-03 DIAGNOSIS — F17210 Nicotine dependence, cigarettes, uncomplicated: Secondary | ICD-10-CM | POA: Diagnosis present

## 2014-10-03 DIAGNOSIS — Z803 Family history of malignant neoplasm of breast: Secondary | ICD-10-CM | POA: Diagnosis not present

## 2014-10-03 DIAGNOSIS — Z833 Family history of diabetes mellitus: Secondary | ICD-10-CM

## 2014-10-03 DIAGNOSIS — F410 Panic disorder [episodic paroxysmal anxiety] without agoraphobia: Secondary | ICD-10-CM | POA: Diagnosis present

## 2014-10-03 DIAGNOSIS — Z8639 Personal history of other endocrine, nutritional and metabolic disease: Secondary | ICD-10-CM | POA: Insufficient documentation

## 2014-10-03 DIAGNOSIS — F250 Schizoaffective disorder, bipolar type: Secondary | ICD-10-CM | POA: Diagnosis present

## 2014-10-03 DIAGNOSIS — Z825 Family history of asthma and other chronic lower respiratory diseases: Secondary | ICD-10-CM

## 2014-10-03 DIAGNOSIS — G4700 Insomnia, unspecified: Secondary | ICD-10-CM | POA: Diagnosis present

## 2014-10-03 DIAGNOSIS — Z8249 Family history of ischemic heart disease and other diseases of the circulatory system: Secondary | ICD-10-CM | POA: Diagnosis not present

## 2014-10-03 DIAGNOSIS — F29 Unspecified psychosis not due to a substance or known physiological condition: Secondary | ICD-10-CM

## 2014-10-03 DIAGNOSIS — Z853 Personal history of malignant neoplasm of breast: Secondary | ICD-10-CM | POA: Insufficient documentation

## 2014-10-03 LAB — PREGNANCY, URINE: Preg Test, Ur: NEGATIVE

## 2014-10-03 MED ORDER — BUPROPION HCL ER (XL) 150 MG PO TB24
150.0000 mg | ORAL_TABLET | Freq: Every day | ORAL | Status: DC
  Administered 2014-10-03 – 2014-10-04 (×2): 150 mg via ORAL
  Filled 2014-10-03 (×4): qty 1

## 2014-10-03 MED ORDER — MAGNESIUM HYDROXIDE 400 MG/5ML PO SUSP: 30.0000 mL | Freq: Every day | ORAL | Status: DC | PRN

## 2014-10-03 MED ORDER — ZIPRASIDONE MESYLATE 20 MG IM SOLR: 20.0000 mg | INTRAMUSCULAR | Status: DC | PRN

## 2014-10-03 MED ORDER — RISPERIDONE 2 MG PO TBDP: 2.0000 mg | ORAL_TABLET | Freq: Three times a day (TID) | ORAL | Status: DC | PRN

## 2014-10-03 MED ORDER — OLANZAPINE 2.5 MG PO TABS
2.5000 mg | ORAL_TABLET | Freq: Every day | ORAL | Status: DC
  Administered 2014-10-03: 2.5 mg via ORAL
  Filled 2014-10-03 (×3): qty 1

## 2014-10-03 MED ORDER — LORAZEPAM 1 MG PO TABS: 1.0000 mg | ORAL_TABLET | ORAL | Status: DC | PRN

## 2014-10-03 MED ORDER — ASPIRIN EC 325 MG PO TBEC
325.0000 mg | DELAYED_RELEASE_TABLET | Freq: Two times a day (BID) | ORAL | Status: DC
  Filled 2014-10-03 (×7): qty 1

## 2014-10-03 MED ORDER — CLONAZEPAM 0.5 MG PO TABS
0.5000 mg | ORAL_TABLET | Freq: Two times a day (BID) | ORAL | Status: DC
  Administered 2014-10-03: 0.5 mg via ORAL
  Filled 2014-10-03: qty 1

## 2014-10-03 MED ORDER — LISINOPRIL 5 MG PO TABS
5.0000 mg | ORAL_TABLET | Freq: Every day | ORAL | Status: DC
  Filled 2014-10-03: qty 1

## 2014-10-03 MED ORDER — CLONAZEPAM 0.5 MG PO TABS
0.5000 mg | ORAL_TABLET | Freq: Two times a day (BID) | ORAL | Status: DC | PRN
  Administered 2014-10-05 – 2014-10-08 (×3): 0.5 mg via ORAL
  Filled 2014-10-03 (×3): qty 1

## 2014-10-03 MED ORDER — VENLAFAXINE HCL ER 37.5 MG PO CP24
37.5000 mg | ORAL_CAPSULE | Freq: Every day | ORAL | Status: DC
  Administered 2014-10-04: 37.5 mg via ORAL
  Filled 2014-10-03 (×3): qty 1

## 2014-10-03 MED ORDER — AMLODIPINE BESYLATE 5 MG PO TABS
5.0000 mg | ORAL_TABLET | Freq: Every day | ORAL | Status: DC
Start: 2014-10-03 — End: 2014-10-07
  Filled 2014-10-03 (×7): qty 1

## 2014-10-03 MED ORDER — ALUM & MAG HYDROXIDE-SIMETH 200-200-20 MG/5ML PO SUSP: 30.0000 mL | ORAL | Status: DC | PRN

## 2014-10-03 MED ORDER — ACETAMINOPHEN 325 MG PO TABS: 650.0000 mg | ORAL_TABLET | Freq: Four times a day (QID) | ORAL | Status: DC | PRN

## 2014-10-03 MED ORDER — LAMOTRIGINE 100 MG PO TABS
150.0000 mg | ORAL_TABLET | Freq: Every day | ORAL | Status: DC
  Administered 2014-10-03 – 2014-10-09 (×7): 150 mg via ORAL
  Filled 2014-10-03 (×2): qty 1.5
  Filled 2014-10-03 (×4): qty 1
  Filled 2014-10-03 (×2): qty 1.5
  Filled 2014-10-03: qty 1

## 2014-10-03 MED ORDER — OLANZAPINE 5 MG PO TBDP: 5.0000 mg | ORAL_TABLET | Freq: Three times a day (TID) | ORAL | Status: DC | PRN

## 2014-10-03 MED ORDER — TRAZODONE HCL 50 MG PO TABS
50.0000 mg | ORAL_TABLET | Freq: Every evening | ORAL | Status: DC | PRN
  Administered 2014-10-03 – 2014-10-04 (×2): 50 mg via ORAL
  Filled 2014-10-03 (×8): qty 1

## 2014-10-03 NOTE — ED Provider Notes (Signed)
Patient has been accepted at [facility] under Dr. Parke Poisson. Patient is awake in her room. She states she doesn't know why she's been admitted. She states she went to see her psychiatrist today and may have said something that made him concerned that she does not know what she said. On review of her chart patient's family describes patient having delusions and expressing suicidal thoughts. This was relayed to her psychiatrist today who had her sent for evaluation.  Rolland Porter, MD, Barbette Or, MD 10/03/14 [phone]
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
## 2014-10-03 NOTE — Progress Notes (Signed)
D:  Patient reports that her day was "up and down", but she denies SI/HI/AVH at this time.  She is interactive on the unit and can be seen playing games with other patients in the dayroom.  She reports ongoing depression, but does contract for safety on the unit.  A:  Medications administered as ordered.  Safety checks q 15 minutes.  Emotional support provided.  R:  Safety maintained on unit.

## 2014-10-03 NOTE — Tx Team (Signed)
Interdisciplinary Treatment Plan Update (Adult) Date: 10/03/2014   Date: 10/03/2014 1:43 PM  Progress in Treatment:  Attending groups: No Participating in groups: No Taking medication as prescribed: Yes  Tolerating medication: Yes  Family/Significant othe contact made: No, CSW attempting to make contact with daughter Patient understands diagnosis: No, limited insight; remains paranoid Discussing patient identified problems/goals with staff: Yes  Medical problems stabilized or resolved: Yes  Denies suicidal/homicidal ideation: Yes  Patient has not harmed self or Others: Yes   New problem(s) identified: None identified at this time.   Discharge Plan or Barriers: Pt will return home and follow-up with her established providers  Additional comments: n/a   Reason for Continuation of Hospitalization:  Anxiety Depression Medication stabilization Suicidal ideation Psychotic  Estimated length of stay: 3-5 days  Review of initial/current patient goals per problem list:   1.  Goal(s): Patient will participate in aftercare plan  Met:  Yes  Target date: 3-5 days from date of admission   As evidenced by: Patient will participate within aftercare plan AEB aftercare provider and housing plan at discharge being identified.   10/03/14: Pt will return home and follow-up with established providers.  2.  Goal (s): Patient will exhibit decreased depressive symptoms and suicidal ideations.  Met:  No  Target date: 3-5 days from date of admission   As evidenced by: Patient will utilize self rating of depression at 3 or below and demonstrate decreased signs of depression or be deemed stable for discharge by MD. 10/03/14: Pt was admitted with symptoms of depression, rating 10/10. Pt continues to present with flat affect and depressive symptoms.  Pt will demonstrate decreased symptoms of depression and rate depression at 3/10 or lower prior to discharge.  3.  Goal(s): Patient will demonstrate  decreased signs and symptoms of anxiety.  Met:  No  Target date: 3-5 days from date of admission   As evidenced by: Patient will utilize self rating of anxiety at 3 or below and demonstrated decreased signs of anxiety, or be deemed stable for discharge by MD 10/03/14: Pt was admitted with increased levels of anxiety and is currently rating those symptoms highly. Pt will demonstrated decreased symptoms of anxiety and rate it at 3/10 prior to d/c.  5.  Goal(s): Patient will demonstrate decreased signs of psychosis  . Met:  No . Target date: 3-5 days from date of admission  . As evidenced by: Patient will demonstrate decreased frequency of AVH or return to baseline function o 10/03/14: Pt demonstrates paranoid thought processes, reports that "they are always listening" and is reluctant to trust staff.  Attendees:  Patient:    Family:    Physician: Dr. Sabra Heck, MD  10/03/2014 1:00 PM  Nursing: Lars Pinks, RN Case manager  10/03/2014 1:00 PM  Clinical Social Worker Peri Maris, Latanya Presser, MSW 10/03/2014 1:00 PM  Other: Lucinda Dell, Beverly Sessions Liasion 10/03/2014 1:00 PM  Clinical:  Eben Burow, RN; Festus Aloe, RN 10/03/2014 1:00 PM  Other:  10/03/2014 1:00 PM  Other:      Norman Clay MSW

## 2014-10-03 NOTE — BHH Group Notes (Signed)
[facility] Group Notes:  (Nursing/MHT/Case Management/Adjunct)  Date:  10/03/2014  Time: 0930 Type of Therapy:  Nurse Education  Participation Level:  Active  Participation Quality:  Appropriate and Sharing  Affect:  Appropriate  Cognitive:  Alert and Appropriate  Insight:  Good  Engagement in Group:  Engaged  Modes of Intervention:  Activity, Discussion, Education and Socialization  Summary of Progress/Problems:  Forrestine Him 10/03/2014, 11:17 AM

## 2014-10-03 NOTE — Progress Notes (Signed)
Patient attended karaoke group tonight.  

## 2014-10-03 NOTE — Tx Team (Signed)
Initial Interdisciplinary Treatment Plan   PATIENT STRESSORS: Loss of custody of son Marital or family conflict   PATIENT STRENGTHS: Average or above average intelligence Capable of independent living Financial means General fund of knowledge Supportive family/friends   PROBLEM LIST: Problem List/Patient Goals Date to be addressed Date deferred Reason deferred Estimated date of resolution  "Have better family dynamics" 10/03/2014     "New coping skills" 10/03/2014     Depression 10/03/2014     Suicidal ideation 10/03/2014                                    DISCHARGE CRITERIA:  Ability to meet basic life and health needs Improved stabilization in mood, thinking, and/or behavior Need for constant or close observation no longer present  PRELIMINARY DISCHARGE PLAN: Outpatient therapy Participate in family therapy Return to previous living arrangement Return to previous work or school arrangements  PATIENT/FAMIILY INVOLVEMENT: This treatment plan has been presented to and reviewed with the patient, Donna Black.  The patient and family have been given the opportunity to ask questions and make suggestions.  Gildardo Pounds 10/03/2014, 5:18 AM

## 2014-10-03 NOTE — BHH Counselor (Signed)
Adult Comprehensive Assessment  Patient ID: Donna Black, female   DOB: March 19, 1965, 49 y.o.   MRN: [id number]  Information Source: Information source: Patient  Current Stressors:  Educational / Learning stressors:  (denies) Employment / Job issues: denies Family Relationships: "upsettingPublishing copy / Lack of resources (include bankruptcy): denies Housing / Lack of housing: denies Physical health (include injuries & life threatening diseases): denies Social relationships: poor- lack of trust Substance abuse: denies Bereavement / Loss: mom died in 17-Aug-2022 years ago  Living/Environment/Situation:  Living Arrangements: Alone Living conditions (as described by patient or guardian):  (husband asked for divorce in June) How long has patient lived in current situation?: "years" What is atmosphere in current home: Other (Comment) (she is upset by her separation- husband driven)  Family History:  Marital status: Separated Separated, when?:  (June 4,16) What types of issues is patient dealing with in the relationship?: lack of trust, "lack of privacy" Additional relationship information: She did not want the separation or divorce  Does patient have children?: Yes How many children?: 2 How is patient's relationship with their children?:  (reports she sees and talks to her twin daughters, 84yo, regularly. She also reports they are "upset wiith me for things I did".)  Childhood History:  By whom was/is the patient raised?: Both parents Additional childhood history information: Good childhood- was closer to her mom than dad Description of patient's relationship with caregiver when they were a child: dad was less involved than mom Patient's description of current relationship with people who raised him/her:  (mom- deceased dad- "upset with me too") Does patient have siblings?: Yes Number of Siblings: 1 Description of patient's current relationship with siblings:  (brother lives in [place], [place]- "he's upset with me too") Did patient suffer any verbal/emotional/physical/sexual abuse as a child?: No Did patient suffer from severe childhood neglect?: No Has patient ever been sexually abused/assaulted/raped as an adolescent or adult?: No Has patient been effected by domestic violence as an adult?: No  Education:  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) Currently a student?: No Learning disability?: No  Employment/Work Situation:   Employment situation: Unemployed What is the longest time patient has a held a job?: less than 10 years ([employer]) ) Has patient ever been in the [employer]?: No  Financial Resources:   Museum/gallery curator resources: Income from spouse Does patient have a representative payee or guardian?: No  Alcohol/Substance Abuse:   What has been your use of drugs/alcohol within the last 12 months?: denies use If attempted suicide, did drugs/alcohol play a role in this?: No Alcohol/Substance Abuse Treatment Hx: Denies past history Has alcohol/substance abuse ever caused legal problems?: No  Social Support System:   Heritage manager System: Poor Describe Community Support System:  (she acknowledges her lack of trust of others- she reports she has withdrawn from others because of her distrust and "being embarrassed that I have upset them") Type of faith/religion:  (christian) How does patient's faith help to cope with current illness?: "gives me strength and hope"  Leisure/Recreation:   Leisure and Hobbies: gardening; "hands in the dirt", cooking/baking  Strengths/Needs:   What things does the patient do well?: cooking, baking In what areas does patient struggle / problems for patient: relationships- trusting others, more involvement/activity  Discharge Plan:   Does patient have access to transportation?: Yes Will patient be returning to same living situation after discharge?: Yes Currently receiving community mental  health services: Yes (From Whom) (Dr Adele Schilder (psychiatrist),  Hulen Luster (counselor)) Does patient have financial barriers related to discharge medications?: No  Summary/Recommendations:    CSW met with patient who was soft spoken and somewhat flat and withdrawn. She made several comments during assessment that appeared to be of paranoid-type. "It doesn't matter where we meet because they are watching and listening to [name] any where in here". She also made comments related to not trusting her family as well as her therapist/counselor. She is afraid of losing her "civil rights and invasion of privacy". She is saddened by her recent separation and reports that she and her husband of 15 years separated because "I didn't feel safe and had no privacy". She is quick to clarify she felt unsafe with her husband not because of his actions/behaviors but more because of her 'feelings'. She plans to follow up with Dr Adele Schilder as prior to admission and wants to contiunue with Hulen Luster, her counselor, even though she is worried/paranoid about him getting these records.   Ludwig Clarks. 10/03/2014

## 2014-10-03 NOTE — Progress Notes (Signed)
Pt started on Norvasc 5 mg by the on-call provider this morning. Pt presents asymptomatic. Pt would like to speak with the psychiatrist prior to taking this medication. Pt reports that these readings are typical for her at home as well.

## 2014-10-03 NOTE — BHH Suicide Risk Assessment (Signed)
[facility] Admission Suicide Risk Assessment   Nursing information obtained from:  Patient Demographic factors:  Living alone Current Mental Status:  NA Loss Factors:  Loss of significant relationship Historical Factors:  Family history of mental illness or substance abuse Risk Reduction Factors:  Employed, Positive social support Total Time spent with patient: 30 minutes Principal Problem: MDD (major depressive disorder), recurrent, severe, with psychosis Diagnosis:   Patient Active Problem List   Diagnosis Date Noted  . MDD (major depressive disorder), recurrent, severe, with psychosis [F33.3] 10/03/2014  . HX: breast cancer [Z85.3] 10/03/2014  . Hx of Graves' disease [Z86.39] 10/03/2014  . Closed bimalleolar fracture of right ankle [S82.841A] 09/18/2013  . Bimalleolar fracture [S82.707E] 09/18/2013  . 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 Tobacco abuse [Z72.0] 03/31/2011  . 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  World Pharmacologist [facility]). Score between 0-7:  no or low risk or alcohol related problems. Score between 8-15:  moderate risk of alcohol related problems. Score between 16-19:  high risk of alcohol related problems. Score 20 or above:  warrants further diagnostic evaluation for alcohol dependence and treatment.   CLINICAL FACTORS:   Previous Psychiatric Diagnoses and Treatments Medical Diagnoses and Treatments/Surgeries   Musculoskeletal: Strength & Muscle Tone: within normal limits Gait & Station: normal Patient leans: N/A  Psychiatric Specialty Exam: Physical Exam  Review of Systems  Psychiatric/Behavioral: Positive for depression. The patient is nervous/anxious.   All other systems reviewed and are negative.   Blood pressure  156/99, pulse 93, temperature 98.4 F (36.9 C), temperature source Oral, resp. rate 20, height 5\' 5"  (1.651 m), weight 60.782 kg (134 lb), last menstrual period 09/23/2014.Body mass index is 22.3 kg/(m^2).  General Appearance: Fairly Groomed  Engineer, water::  Fair  Speech:  Clear and Coherent  Volume:  Decreased  Mood:  Anxious and Depressed  Affect:  Labile and Tearful  Thought Process:  Goal Directed  Orientation:  Full (Time, Place, and Person)  Thought Content:  Paranoid Ideation and Rumination  Suicidal Thoughts:  No  Homicidal Thoughts:  No  Memory:  Immediate;   Fair Recent;   Fair Remote;   Fair  Judgement:  Fair  Insight:  Shallow  Psychomotor Activity:  Normal  Concentration:  Poor  Recall:  [employer] of Knowledge:Fair  Language: Fair  Akathisia:  No  Handed:  Right  AIMS (if indicated):     Assets:  Desire for Improvement  Sleep:     Cognition: WNL  ADL's:  Intact     COGNITIVE FEATURES THAT CONTRIBUTE TO RISK:  Closed-mindedness, Polarized thinking and Thought constriction (tunnel vision)    SUICIDE RISK:   Moderate:  Frequent suicidal ideation with limited intensity, and duration, some specificity in terms of plans, no associated intent, good self-control, limited dysphoria/symptomatology, some risk factors present, and identifiable protective factors, including available and accessible social support.  PLAN OF CARE: Patient presented IVCed after out patient provider as well as family were concerned. Pt with hx of MDD, reports several psychosocial stressors , her separation from her husband as well as inability to have physical custody of her son.  Discussed with pt about her medications . She is currently on wellbutrin XL 150 mg po daily - reports this cannot be increased since increasing it causes side effects.  She is also on Lamictal 150 mg po daily for mood sx. Klonopin 0.5 mg po qhs prn for anxiety,sleep. Discussed adding Zyprexa zydis 2.5 mg po qhs for  paranoia as well as to augment the effect of antidepressant . She agrees with plan. ( Patient was on Abilify 30 mg po daily, which she stopped taking since she felt it was ineffective.) Also discussed adding Effexor XR 37.5 mg po daily - first dose starts tomorrow AM. If she does well on Efffexor XR , then wellbutrin could be slowly tapered off.   Reviewed past medical records,treatment plan.  Will continue to monitor vitals ,medication compliance and treatment side effects while patient is here.  Restart Home medications were needed. Will monitor for medical issues as well as call consult as needed.  Reviewed labs ,will order urine pregnancy test, TSH,metabolic panel.  CSW will start working on disposition.  Patient to participate in therapeutic milieu .       Medical Decision Making:  Review of Psycho-Social Stressors (1), Review or order clinical lab tests (1), Review and summation of old records (2), Established Problem, Worsening (2), Review of Medication Regimen & Side Effects (2) and Review of New Medication or Change in Dosage (2)  I certify that inpatient services furnished can reasonably be expected to improve the patient's condition.   Esli Clements MD 10/03/2014, 12:18 PM

## 2014-10-03 NOTE — BHH Group Notes (Signed)
[facility] Group Therapy 10/03/2014 1:15pm  Type of Therapy: [facility] Presentation  Pt did not attend, declined invitation.   Peri Maris, [place] 10/03/2014 1:42 PM

## 2014-10-03 NOTE — Plan of Care (Signed)
Dr. Tomi Bamberger made aware that patient is accepted to [facility].

## 2014-10-03 NOTE — Progress Notes (Addendum)
D: Per patient self inventory form patient reports she slept good last night. She reports a fair appetite, good concentration. She rates depression 0/10, hopelessness 0/10, anxiety 3/10- all on 0-10 scale, 10 being the worse. She denies physical pain. She reports her goal today is "understanding myself." Pt cautious on approach. She attended nursing group, with minimal participation. She denies SI/HI. Denies AVH.  A:Special checks q 15 mins in place for safety. Medication administered per MD order (see eMAR). Encouragement and support provided.  R:Safety maintained. Will continue to monitor.
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
## 2014-10-04 LAB — TSH: TSH: 2.232 u[IU]/mL (ref 0.350–4.500)

## 2014-10-04 LAB — LIPID PANEL
Cholesterol: 232 mg/dL — ABNORMAL HIGH (ref 0–200)
HDL: 45 mg/dL (ref >40)
LDL Cholesterol: 168 mg/dL — ABNORMAL HIGH (ref 0–99)
Total CHOL/HDL Ratio: 5.2 RATIO
Triglycerides: 94 mg/dL (ref <150)
VLDL: 19 mg/dL (ref 0–40)

## 2014-10-04 MED ORDER — ARIPIPRAZOLE 5 MG PO TABS
5.0000 mg | ORAL_TABLET | Freq: Every day | ORAL | Status: DC
  Administered 2014-10-04: 5 mg via ORAL
  Filled 2014-10-04 (×3): qty 1

## 2014-10-04 MED ORDER — VENLAFAXINE HCL ER 75 MG PO CP24
75.0000 mg | ORAL_CAPSULE | Freq: Every day | ORAL | Status: DC
  Administered 2014-10-05 – 2014-10-09 (×5): 75 mg via ORAL
  Filled 2014-10-04 (×6): qty 1

## 2014-10-04 MED ORDER — ARIPIPRAZOLE 10 MG PO TABS
10.0000 mg | ORAL_TABLET | Freq: Every day | ORAL | Status: DC
  Administered 2014-10-05: 10 mg via ORAL
  Filled 2014-10-04 (×4): qty 1

## 2014-10-04 NOTE — Progress Notes (Signed)
[facility] MD Progress Note  10/04/2014 7:30 PM Donna Black  MRN:  [id number] Subjective:   Patient states " I am OK". Denies medication side effects. Objective : I have met with patient and have reviewed case with treatment team. As per staff patient has presented with some paranoia, distrust, and limited interactions with peers , but no disruptive behaviors on unit. Patient presents as superficially polite, pleasant, but initially clearly guarded. She states she has been facing some significant stressors recently, mainly separation from husband and not having custody of her son, who is currently with the father. She states " I am fine, though, I'm OK". She denies any suicidal ideations.  Minimizes neuro-vegetative symptoms of depression at present.  Of note, at a later time of day, patient asked to see me again. Although still somewhat guarded , was more verbal and stated she has felt her husband has been monitoring her at home , and that she sometimes notices people she does not know talking about  Her . She is unable to describe how her husband monitors her, but she states she does hear " clicking sounds". Denies hearing voices and denies command hallucinations. Reports history of alcohol abuse in the past, but denies any recent drinking and does not present with any WDL symptoms at this time.  Denies medication side effects. Prior to admission had been prescribed Abilify, but admits she was not taking it , " because I did not think I needed it". Does not endorse having had medication side effects. TSH within normal, lipid panel remarkable for mild hypercholesterolemia.   Principal Problem: MDD (major depressive disorder), recurrent, severe, with psychosis Diagnosis:   Patient Active Problem List   Diagnosis Date Noted  . MDD (major depressive disorder), recurrent, severe, with psychosis [F33.3] 10/03/2014  . HX: breast cancer [Z85.3] 10/03/2014  . Hx of Graves' disease [Z86.39] 10/03/2014   . Closed bimalleolar fracture of right ankle [S82.841A] 09/18/2013  . Bimalleolar fracture [S14.239R] 09/18/2013  . 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 Tobacco abuse [Z72.0] 03/31/2011  . 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 Wears glasses   . Depression   . Heart murmur     told off and on in past that she has had one  . Grave's disease     diagnosed about 7 yrs ago but after a year she has been doing well  . Anemia     when she was younger  . Complication of anesthesia     ?, heart rate dropped w/wisdom teeth  . Cancer 2013    bilateral breast cancer  . Pneumonia   . GERD (gastroesophageal reflux disease)     with pregnancy only  . High cholesterol     Past Surgical History  Procedure Laterality Date  . Cholecystectomy    . Wisdom tooth extraction    . Breast fibroadenoma surgery  1987, 2000    bilateral  . Breast reconstruction  05/12/2011    Procedure: BREAST RECONSTRUCTION;  Surgeon: Crissie Reese, MD;  Location: [place];  Service: Plastics;  Laterality: Bilateral;  PLACEMENT OF BILATERAL TISSUE EXPANDERS WITH POSSIBLE USE OF HD FLEX  . Cesarean section      2001,cyst; 2002 birth  . Orif ankle fracture Right 09/18/2013    Procedure: OPEN REDUCTION INTERNAL FIXATION (ORIF) RIGHT ANKLE FRACTURE;  Surgeon: Mcarthur Rossetti,  MD;  Location: [place];  Service: Orthopedics;  Laterality: Right;  . Breast reconstruction    . Mastectomy Bilateral    Family History:  Family History  Problem Relation Age of Onset  . Breast cancer Maternal Grandmother   . Cancer Maternal Grandmother     breast  . Heart disease Mother   . Stroke Mother   . COPD Mother   . Diabetes Mother   . Heart disease Father    Social History:  History  Alcohol Use  . 1.2 oz/week  . 2 Glasses of wine per week    Comment: sometimes daily or can just be  weekends     History  Drug Use No    Social History   Social History  . Marital Status: Married    Spouse Name: N/A  . Number of Children: N/A  . Years of Education: N/A   Social History Main Topics  . Smoking status: Current Some Day Smoker -- 1.00 packs/day    Types: Cigarettes    Last Attempt to Quit: 10/01/2013  . Smokeless tobacco: Never Used  . Alcohol Use: 1.2 oz/week    2 Glasses of wine per week     Comment: sometimes daily or can just be weekends  . Drug Use: No  . Sexual Activity: Yes    Birth Control/ Protection: Condom   Other Topics Concern  . None   Social History Narrative   Additional History:    Sleep: Good  Appetite:  Good   Assessment:   Musculoskeletal: Strength & Muscle Tone: within normal limits Gait & Station: normal Patient leans: N/A   Psychiatric Specialty Exam: Physical Exam  ROS denies headache, denies chest pain, denies shortness of breath  Blood pressure 92/56, pulse 118, temperature 98.2 F (36.8 C), temperature source Oral, resp. rate 20, height $RemoveBe'5\' 5"'zRocZjRwb$  (1.651 m), weight 134 lb (60.782 kg), last menstrual period 09/23/2014.Body mass index is 22.3 kg/(m^2).  General Appearance: Fairly Groomed  Engineer, water::  Good  Speech:  Normal Rate  Volume:  Normal  Mood:  Anxious  Affect:  Restricted  Thought Process:  Linear  Orientation:  Other:  fully alert and attentive   Thought Content:  (+) hallucinations and paranoid ideations as above , not internally preoccupied at this time  Suicidal Thoughts:  No- denies any thoughts of hurting self or of SI  Homicidal Thoughts:  No  Memory:  recent and remote grossly intact   Judgement:  Fair  Insight:  Fair  Psychomotor Activity:  Normal  Concentration:  Good  Recall:  Good  Fund of Knowledge:Good  Language: Good  Akathisia:  Negative  Handed:  Right  AIMS (if indicated):     Assets:  Desire for Improvement Resilience  ADL's:  Fair   Cognition: WNL  Sleep:        Current  Medications: Current Facility-Administered Medications  Medication Dose Route Frequency Provider Last Rate Last Dose  . acetaminophen (TYLENOL) tablet 650 mg  650 mg Oral Q6H PRN Laverle Hobby, PA-C      . alum & mag hydroxide-simeth (MAALOX/MYLANTA) 200-200-20 MG/5ML suspension 30 mL  30 mL Oral Q4H PRN Laverle Hobby, PA-C      . amLODipine (NORVASC) tablet 5 mg  5 mg Oral Daily Laverle Hobby, PA-C   5 mg at 10/03/14 0756  . [START ON 10/05/2014] ARIPiprazole (ABILIFY) tablet 10 mg  10 mg Oral Daily Jenne Campus, MD      . clonazePAM (  KLONOPIN) tablet 0.5 mg  0.5 mg Oral BID PRN Ursula Alert, MD      . lamoTRIgine (LAMICTAL) tablet 150 mg  150 mg Oral Daily Laverle Hobby, PA-C   150 mg at 10/04/14 0739  . magnesium hydroxide (MILK OF MAGNESIA) suspension 30 mL  30 mL Oral Daily PRN Laverle Hobby, PA-C      . OLANZapine zydis (ZYPREXA) disintegrating tablet 5 mg  5 mg Oral TID PRN Ursula Alert, MD      . traZODone (DESYREL) tablet 50 mg  50 mg Oral QHS,MR X 1 Laverle Hobby, PA-C   50 mg at 10/03/14 2154  . [START ON 10/05/2014] venlafaxine XR (EFFEXOR-XR) 24 hr capsule 75 mg  75 mg Oral Q breakfast Jenne Campus, MD        Lab Results:  Results for orders placed or performed during the hospital encounter of 10/03/14 (from the past 48 hour(s))  Pregnancy, urine     Status: None   Collection Time: 10/03/14  3:29 PM  Result Value Ref Range   Preg Test, Ur NEGATIVE NEGATIVE    Comment:        THE SENSITIVITY OF THIS METHODOLOGY IS >20 mIU/mL. Performed at [facility]   TSH     Status: None   Collection Time: 10/04/14  6:16 AM  Result Value Ref Range   TSH 2.232 0.350 - 4.500 uIU/mL    Comment: Performed at [facility]  Lipid panel     Status: Abnormal   Collection Time: 10/04/14  6:16 AM  Result Value Ref Range   Cholesterol 232 (H) 0 - 200 mg/dL   Triglycerides 94 <150 mg/dL   HDL 45 >40 mg/dL   Total CHOL/HDL Ratio 5.2  RATIO   VLDL 19 0 - 40 mg/dL   LDL Cholesterol 168 (H) 0 - 99 mg/dL    Comment:        Total Cholesterol/HDL:CHD Risk Coronary Heart Disease Risk Table                     Men   Women  1/2 Average Risk   3.4   3.3  Average Risk       5.0   4.4  2 X Average Risk   9.6   7.1  3 X Average Risk  23.4   11.0        Use the calculated Patient Ratio above and the CHD Risk Table to determine the patient's CHD Risk.        ATP III CLASSIFICATION (LDL):  <100     mg/dL   Optimal  100-129  mg/dL   Near or Above                    Optimal  130-159  mg/dL   Borderline  160-189  mg/dL   High  >190     mg/dL   Very High Performed at [facility]     Physical Findings: AIMS: Facial and Oral Movements Muscles of Facial Expression: None, normal Lips and Perioral Area: None, normal Jaw: None, normal Tongue: None, normal,Extremity Movements Upper (arms, wrists, hands, fingers): None, normal Lower (legs, knees, ankles, toes): None, normal, Trunk Movements Neck, shoulders, hips: None, normal, Overall Severity Severity of abnormal movements (highest score from questions above): None, normal Incapacitation due to abnormal movements: None, normal Patient's awareness of abnormal movements (rate only patient's report): No Awareness, Dental Status Current  problems with teeth and/or dentures?: No Does patient usually wear dentures?: No  CIWA:  CIWA-Ar Total: 0 COWS:  COWS Total Score: 1   Assessment - patient presents guarded ,  with psychotic symptoms. At this time minimizing depression, although endorsing significant psychosocial stressors such as separation and not having custody of her son. No suicidal ideations. History of medication non compliance prior to admission.   Treatment Plan Summary: Daily contact with patient to assess and evaluate symptoms and progress in treatment, Medication management, Plan inpatient treatment and medications as below Restart Abilify 10 mgrs QDAY for  psychosis and mood disorder- patient had been prescribed this medication in the past without side effects but states had not been taking D/C Zyprexa . Start Effexor XR 75 mgrsd QDAY for depression.  Continue Lamictal 150 mgrs QDAY for depression/mood disorder  D/C ASA- patient states she was not taking ASA at home and has " no reason to take it ". Continue Trazodone 50 mgrs QHS PRN for insomnia  Medical Decision Making:  Established Problem, Stable/Improving (1), Review of Psycho-Social Stressors (1), Review or order clinical lab tests (1), Review of Medication Regimen & Side Effects (2) and Review of New Medication or Change in Dosage (2)     Donna Black 10/04/2014, 7:30 PM

## 2014-10-04 NOTE — Progress Notes (Addendum)
D: Per patient self inventory form patient reports she slept good last night. "Woke up 2 or 3 times but went back to sleep." She reports a good appetite, normal energy level, good concentration. She rates depression 0/10, hopelessnes 0/10- both on 0-10 scale, 10 being the worse. She denies SI/HI. Denies AVH. She denies physical pain. She reports her goal for the day is "improve myself." She repots she will meet her goal by "listen." Behavior pleasant.  A:Special checks q 15 mins in place for safety. Medication administered per MD order (see eMAR). Encouragement and support provided.  R:Safety maintained. Will continue to monitor.

## 2014-10-04 NOTE — Progress Notes (Signed)
Recreation Therapy Notes  Date: 08.12.16 Time: 9:30 am Location: [address] Group Room  Group Topic: Stress Management  Goal Area(s) Addresses:  Patient will verbalize importance of using healthy stress management.  Patient will identify positive emotions associated with healthy stress management.   Behavioral Response: Engaged  Intervention: Stress Management  Activity :  Guided Automotive engineer.  LRT will introduce and educate the patients on the stress management technique of guided imagery.  A script was used to deliver the technique to the patients.  Patients were asked to follow the script read aloud by the LRT to engage in the stress management technique.  Education:  Stress Management, Discharge Planning.   Education Outcome: Acknowledges edcuation/In group clarification offered/Needs additional education  Clinical Observations/Feedback: Patient attended group.   Victorino Sparrow, LRT/CTRS         Victorino Sparrow A 10/04/2014 3:58 PM

## 2014-10-04 NOTE — Plan of Care (Signed)
Problem: Ineffective individual coping Goal: STG: Patient will remain free from self harm Outcome: Progressing Pt has remained free from self harm     

## 2014-10-04 NOTE — BHH Group Notes (Signed)
[facility] LCSW Aftercare Discharge Planning Group Note  10/04/2014 8:45 AM  Participation Quality: Alert, Appropriate and Oriented  Mood/Affect: Appropriate  Depression Rating: 0  Anxiety Rating: "I don't know how to answer"  Thoughts of Suicide: Pt denies SI/HI  Will you contract for safety? Yes  Current AVH: Pt denies  Plan for Discharge/Comments: Pt attended discharge planning group and actively participated in group. CSW discussed suicide prevention education with the group and encouraged them to discuss discharge planning and any relevant barriers. Pt continues to be soft spoken and somewhat withdrawn. Pt reports that she has no current needs and plans to return home.  Transportation Means: Pt reports access to transportation  Supports: No supports mentioned at this time  Donna Black, [place] 10/04/2014 9:17 AM

## 2014-10-04 NOTE — BHH Group Notes (Signed)
[place] LCSW Group Therapy 10/04/2014 1:15pm  Type of Therapy: Group Therapy- Feelings Around Relapse and Recovery  Participation Level: Minimal  Participation Quality:  Limited  Affect:  Inappropriate; smiled throughout group  Cognitive: Alert and Oriented   Insight:  Limited  Engagement in Therapy: Limited   Modes of Intervention: Clarification, Confrontation, Discussion, Education, Exploration, Limit-setting, Orientation, Problem-solving, Rapport Building, Art therapist, Socialization and Support  Summary of Progress/Problems: The topic for today was feelings about relapse. The group discussed what relapse prevention is to them and identified triggers that they are on the path to relapse. Members also processed their feeling towards relapse and were able to relate to common experiences. Group also discussed coping skills that can be used for relapse prevention.  Pt attended group and was observed to be smiling throughout the group session. Pt did not participate verbally, remained withdrawn.   Therapeutic Modalities:   Cognitive Behavioral Therapy Solution-Focused Therapy Assertiveness Training Relapse Prevention Therapy    Norman Clay [phone] 10/04/2014 3:02 PM

## 2014-10-05 LAB — HEMOGLOBIN A1C
Hgb A1c MFr Bld: 5.3 % (ref 4.8–5.6)
MEAN PLASMA GLUCOSE: 105 mg/dL

## 2014-10-05 MED ORDER — ARIPIPRAZOLE 10 MG PO TABS
10.0000 mg | ORAL_TABLET | Freq: Every day | ORAL | Status: DC
  Filled 2014-10-05 (×2): qty 1

## 2014-10-05 MED ORDER — TRAZODONE HCL 100 MG PO TABS
100.0000 mg | ORAL_TABLET | Freq: Every evening | ORAL | Status: DC | PRN
  Administered 2014-10-05 – 2014-10-06 (×3): 100 mg via ORAL
  Filled 2014-10-05 (×6): qty 1

## 2014-10-05 NOTE — Plan of Care (Signed)
Problem: Diagnosis: Increased Risk For Suicide Attempt Goal: STG-Patient Will Attend All Groups On The Unit Outcome: Progressing Patient attended wrap up group and participated on 10/04/2014

## 2014-10-05 NOTE — Progress Notes (Signed)
  Adult Psychoeducational Group Note  Date:  10/05/2014  Time:  9:49 PM  Group Topic/Focus:  Wrap-Up Group:   The focus of this group is to help patients review their daily goal of treatment and discuss progress on daily workbooks.  Participation Level:  Active  Participation Quality:  Attentive  Affect:  Appropriate  Cognitive:  Appropriate  Insight: Good  Engagement in Group:  Engaged  Modes of Intervention:  Discussion  Additional Comments:  Pt stated that she had a good day, pt rated her day an 8.  Pt did stated that she was tired because change of medication, but other than that she had a good day   Denise Bramblett A 10/05/2014, 9:49 PM

## 2014-10-05 NOTE — Progress Notes (Addendum)
D Donna Black remains depressed. Nervous and apprehensive. She completes her daily assessment and on it she writes she denies SI and she rates her depression, hopelessness and anxiety " 0/0/0/", respectively.   A SHe asks appropriate questions about her medications and she is ordered MRI; it will be scheduled on Mon ( 10/07/14) when scheduling office is open  For elective procedures.     R Safety is in place. Will cont to assess for psychosis.

## 2014-10-05 NOTE — Progress Notes (Signed)
Psychoeducational Group Note  Date: 10/05/2014 Time:  1015  Group Topic/Focus:  Identifying Needs:   The focus of this group is to help patients identify their personal needs that have been historically problematic and identify healthy behaviors to address their needs.  Participation Level:  Active  Participation Quality:  Appropriate  Affect:  Appropriate  Cognitive:  Oriented  Insight:  Improving  Engagement in Group:  Engaged  Additional Comments:    Nomar Broad A 

## 2014-10-05 NOTE — BHH Group Notes (Signed)
[place] Group Notes:  (Clinical Social Work)  10/05/2014     1:30-2:45PM  Summary of Progress/Problems:   The main focus of today's process group was to discuss the differences between healthy and unhealthy coping techniques, and why we often choose to use the unhealthy methods.   Group participants shared hurts and fears that they experience with themselves and with others.  Correlations among all were pointed out.  There was significant discussion about benefits of changing the old methods of coping, as well as reasons it is hard and specific things we can do instead. The patient expressed recent experience with the unhealthy coping technique of smoking cigarettes, not exercising, and blaming other people.  She was very quiet, then was called out for the majority of the remaining group time by a practitioner.  Type of Therapy:  Group Therapy - Process   Participation Level:  Active  Participation Quality:  Appropriate, Attentive, Sharing and Supportive  Affect:  Blunted and Depressed  Cognitive:  Alert, Appropriate and Oriented  Insight:  Developing/Improving  Engagement in Therapy:  Engaged  Modes of Intervention:  Education, Motivational Interviewing  Selmer Dominion, LCSW 10/05/2014, 4:45 PM

## 2014-10-05 NOTE — Progress Notes (Signed)
 .  Psychoeducational Group Note    Date: 10/05/2014 Time:  0930    Goal Setting Purpose of Group: To be able to set a goal that is measurable and that can be accomplished in one day Participation Level:  Active  Participation Quality:  Appropriate  Affect:  Appropriate  Cognitive:  Oriented  Insight:  Improving  Engagement in Group:  Engaged  Additional Comments:  Attending the group and participating appropriately.  Zanaria Morell A 

## 2014-10-05 NOTE — Progress Notes (Signed)
Writer has observed patient up in the dayroom watching tv with minimal to no interaction with peers. Writer spoke with her 1:1 and she reports her day as being ok but she has been sleepy most of the day. She reports that the groups have been helpful and she is learning positive coping skills but she reports that she will not be doing outpatient therapy because she feels that it doesn't work for her. She currently denies si/hi/a/v hallucinations. Support and encouragement given, safety maintained on unit with 15 min checks.

## 2014-10-05 NOTE — Progress Notes (Signed)
Patient ID: Donna Black, female   DOB: Oct 20, 1965, 49 y.o.   MRN: [id number] [facility] MD Progress Note  10/05/2014 2:15 PM Donna Black  MRN:  [id number] Subjective:   Patient denies any medication side effects except for some sedation, so wants to change Abilify to QHS. She denies depression today. She seems less guarded and better able to discuss her symptoms. She states that over the last 2 years or so, she has felt her husband is somehow monitoring her . She states that " people sometimes say things " that confirm this suspicion. Sometimes she hears bits of conversations strangers are having and she feels it is about her . She also has had ( more recently) some auditory hallucinations, described as " clicking sounds". She states that these suspicions have contributed to her separation from husband .  Objective : I have met with patient and have reviewed  Chart notes. Patient is improving. She seems calm, less guarded, and is better able to speak about her symptoms and concerns. She states her mood is "OK" and denies depression at present. She denies medication  Side effects, except for sedation. No akathisia noted at this time. She does not appear internally preoccupied. She states she realizes her thoughts - see above - " can seem unusual, but I do believe it is happening ".  Of note, patient had in situ breast cancer 2012.  No disruptive behaviors on unit, more interactive in groups, participating in milieu.  Labs reviewed as below .   Principal Problem: MDD (major depressive disorder), recurrent, severe, with psychosis Diagnosis:   Patient Active Problem List   Diagnosis Date Noted  . MDD (major depressive disorder), recurrent, severe, with psychosis [F33.3] 10/03/2014  . HX: breast cancer [Z85.3] 10/03/2014  . Hx of Graves' disease [Z86.39] 10/03/2014  . Closed bimalleolar fracture of right ankle [S82.841A] 09/18/2013  . Bimalleolar fracture [N40.768G] 09/18/2013  . Tylenol  poisoning [T39.1X1A] 10/09/2012  . Tobacco abuse [Z72.0] 03/31/2011  . 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 Wears glasses   . Depression   . Heart murmur     told off and on in past that she has had one  . Grave's disease     diagnosed about 7 yrs ago but after a year she has been doing well  . Anemia     when she was younger  . Complication of anesthesia     ?, heart rate dropped w/wisdom teeth  . Cancer 2013    bilateral breast cancer  . Pneumonia   . GERD (gastroesophageal reflux disease)     with pregnancy only  . High cholesterol     Past Surgical History  Procedure Laterality Date  . Cholecystectomy    . Wisdom tooth extraction    . Breast fibroadenoma surgery  1987, 2000    bilateral  . Breast reconstruction  05/12/2011    Procedure: BREAST RECONSTRUCTION;  Surgeon: Crissie Reese, MD;  Location: [place];  Service: Plastics;  Laterality: Bilateral;  PLACEMENT OF BILATERAL TISSUE EXPANDERS WITH POSSIBLE USE OF HD FLEX  . Cesarean section      2001,cyst; 2002 birth  . Orif ankle fracture Right 09/18/2013    Procedure: OPEN REDUCTION INTERNAL FIXATION (ORIF) RIGHT ANKLE FRACTURE;  Surgeon: Mcarthur Rossetti, MD;  Location: [place];  Service: Orthopedics;  Laterality: Right;  .  Breast reconstruction    . Mastectomy Bilateral    Family History:  Family History  Problem Relation Age of Onset  . Breast cancer Maternal Grandmother   . Cancer Maternal Grandmother     breast  . Heart disease Mother   . Stroke Mother   . COPD Mother   . Diabetes Mother   . Heart disease Father    Social History:  History  Alcohol Use  . 1.2 oz/week  . 2 Glasses of wine per week    Comment: sometimes daily or can just be weekends     History  Drug Use No    Social History   Social History  . Marital Status: Married    Spouse Name: N/A  .  Number of Children: N/A  . Years of Education: N/A   Social History Main Topics  . Smoking status: Current Some Day Smoker -- 1.00 packs/day    Types: Cigarettes    Last Attempt to Quit: 10/01/2013  . Smokeless tobacco: Never Used  . Alcohol Use: 1.2 oz/week    2 Glasses of wine per week     Comment: sometimes daily or can just be weekends  . Drug Use: No  . Sexual Activity: Yes    Birth Control/ Protection: Condom   Other Topics Concern  . None   Social History Narrative   Additional History:    Sleep: Good  Appetite:  Good   Assessment:   Musculoskeletal: Strength & Muscle Tone: within normal limits Gait & Station: normal Patient leans: N/A   Psychiatric Specialty Exam: Physical Exam  ROS denies headache, denies vomiting. denies chest pain, denies shortness of breath  Blood pressure 102/68, pulse 116, temperature 98.3 F (36.8 C), temperature source Oral, resp. rate 16, height _0  (1.651 m), weight 134 lb (60.782 kg), last menstrual period 09/23/2014.Body mass index is 22.3 kg/(m^2).  General Appearance: improved grooming   Eye Contact::  Good  Speech:  Normal Rate  Volume:  Normal  Mood:  improved, less anxious, denies depression  at this time  Affect:  more reactive   Thought Process:  Linear  Orientation:  Other:  fully alert and attentive   Thought Content:  (+) hallucinations and paranoid ideations as above , not internally preoccupied at this time  Suicidal Thoughts:  No- denies any thoughts of hurting self or of SI  Homicidal Thoughts:  No  Memory:  recent and remote grossly intact   Judgement:  Fair  Insight:  Fair  Psychomotor Activity:  Normal  Concentration:  Good  Recall:  Good  Fund of Knowledge:Good  Language: Good  Akathisia:  Negative  Handed:  Right  AIMS (if indicated):     Assets:  Desire for Improvement Resilience  ADL's:  Fair   Cognition: WNL  Sleep:  Number of Hours: 6     Current Medications: Current  Facility-Administered Medications  Medication Dose Route Frequency Provider Last Rate Last Dose  . acetaminophen (TYLENOL) tablet 650 mg  650 mg Oral Q6H PRN Laverle Hobby, PA-C      . alum & mag hydroxide-simeth (MAALOX/MYLANTA) 200-200-20 MG/5ML suspension 30 mL  30 mL Oral Q4H PRN Laverle Hobby, PA-C      . amLODipine (NORVASC) tablet 5 mg  5 mg Oral Daily Laverle Hobby, PA-C   5 mg at 10/03/14 0756  . [START ON 10/06/2014] ARIPiprazole (ABILIFY) tablet 10 mg  10 mg Oral QHS Jenne Campus, MD      .  clonazePAM (KLONOPIN) tablet 0.5 mg  0.5 mg Oral BID PRN Ursula Alert, MD   0.5 mg at 10/05/14 0302  . lamoTRIgine (LAMICTAL) tablet 150 mg  150 mg Oral Daily Laverle Hobby, PA-C   150 mg at 10/05/14 8366  . magnesium hydroxide (MILK OF MAGNESIA) suspension 30 mL  30 mL Oral Daily PRN Laverle Hobby, PA-C      . traZODone (DESYREL) tablet 100 mg  100 mg Oral QHS,MR X 1 Avion Kutzer A Darey Hershberger, MD      . venlafaxine XR (EFFEXOR-XR) 24 hr capsule 75 mg  75 mg Oral Q breakfast Jenne Campus, MD   75 mg at 10/05/14 2947    Lab Results:  Results for orders placed or performed during the hospital encounter of 10/03/14 (from the past 48 hour(s))  Pregnancy, urine     Status: None   Collection Time: 10/03/14  3:29 PM  Result Value Ref Range   Preg Test, Ur NEGATIVE NEGATIVE    Comment:        THE SENSITIVITY OF THIS METHODOLOGY IS >20 mIU/mL. Performed at [facility]   TSH     Status: None   Collection Time: 10/04/14  6:16 AM  Result Value Ref Range   TSH 2.232 0.350 - 4.500 uIU/mL    Comment: Performed at [facility]  Lipid panel     Status: Abnormal   Collection Time: 10/04/14  6:16 AM  Result Value Ref Range   Cholesterol 232 (H) 0 - 200 mg/dL   Triglycerides 94 <150 mg/dL   HDL 45 >40 mg/dL   Total CHOL/HDL Ratio 5.2 RATIO   VLDL 19 0 - 40 mg/dL   LDL Cholesterol 168 (H) 0 - 99 mg/dL    Comment:        Total Cholesterol/HDL:CHD  Risk Coronary Heart Disease Risk Table                     Men   Women  1/2 Average Risk   3.4   3.3  Average Risk       5.0   4.4  2 X Average Risk   9.6   7.1  3 X Average Risk  23.4   11.0        Use the calculated Patient Ratio above and the CHD Risk Table to determine the patient's CHD Risk.        ATP III CLASSIFICATION (LDL):  <100     mg/dL   Optimal  100-129  mg/dL   Near or Above                    Optimal  130-159  mg/dL   Borderline  160-189  mg/dL   High  >190     mg/dL   Very High Performed at [facility]   Hemoglobin A1c     Status: None   Collection Time: 10/04/14  6:16 AM  Result Value Ref Range   Hgb A1c MFr Bld 5.3 4.8 - 5.6 %    Comment: (NOTE)         Pre-diabetes: 5.7 - 6.4         Diabetes: >6.4         Glycemic control for adults with diabetes: <7.0    Mean Plasma Glucose 105 mg/dL    Comment: (NOTE) Performed At: [facility] [place], [place] [id number] Lindon Romp MD SF:[id number] Performed  at [facility]     Physical Findings: AIMS: Facial and Oral Movements Muscles of Facial Expression: None, normal Lips and Perioral Area: None, normal Jaw: None, normal Tongue: None, normal,Extremity Movements Upper (arms, wrists, hands, fingers): None, normal Lower (legs, knees, ankles, toes): None, normal, Trunk Movements Neck, shoulders, hips: None, normal, Overall Severity Severity of abnormal movements (highest score from questions above): None, normal Incapacitation due to abnormal movements: None, normal Patient's awareness of abnormal movements (rate only patient's report): No Awareness, Dental Status Current problems with teeth and/or dentures?: No Does patient usually wear dentures?: No  CIWA:  CIWA-Ar Total: 0 COWS:  COWS Total Score: 1   Assessment - patient presents improved compared to prior presentation. In particular, presents less guarded, and more amenable to discuss her  symptoms/concerns. Today reports chronic psychotic symptoms, which have been occuring for more than one year. At this time does not appear internally preoccupied, there is no thought disorder, and seems calmer, less paranoid. Tolerating medications well- no side effects except for mild sedation.   Treatment Plan Summary: Daily contact with patient to assess and evaluate symptoms and progress in treatment, Medication management, Plan inpatient treatment and medications as below Change Abilify  To 10 mgrs QHS for psychosis and mood disorder- rationale is that patient states medication is sedating for her Continue  Effexor XR 75 mgrsd QDAY for depression.  Continue Lamictal 150 mgrs QDAY for depression/mood disorder  Continue Trazodone 50 mgrs QHS PRN for insomnia Obtain Head MRI - due to psychotic symptoms x 1-2 years and prior history of breast CA.  Obtain EKG- routine as on antipsychotic .   Medical Decision Making:  Established Problem, Stable/Improving (1), Review of Psycho-Social Stressors (1), Review or order clinical lab tests (1), Review of Medication Regimen & Side Effects (2) and Review of New Medication or Change in Dosage (2)     Jovan Schickling 10/05/2014, 2:15 PM

## 2014-10-05 NOTE — Plan of Care (Signed)
Problem: Diagnosis: Increased Risk For Suicide Attempt Goal: STG-Patient Will Comply With Medication Regime Outcome: Progressing Patient is compliant with scheduled hs medication.

## 2014-10-05 NOTE — Progress Notes (Signed)
Writer spoke with patient briefly after group which she attended and participated. She made mention of her daughter and something about playing games but was soft spoken and when MHT asked her to repeat what she had just said she declined but reported that she loves her daughter. Patient requested her medications before 2100 and it was explained to her that it was too soon. She denies si/hi/a/v hallucinations. She reports that she feels that her medications are working. Patient reported that she was going to her room to lie down until medication time. Patient has a very flat, blunted affect and is guarded with responses to Probation officer. Safety maintained on unit with 15 min checks.

## 2014-10-06 MED ORDER — ARIPIPRAZOLE 15 MG PO TABS
15.0000 mg | ORAL_TABLET | Freq: Every day | ORAL | Status: DC
  Administered 2014-10-06 – 2014-10-07 (×2): 15 mg via ORAL
  Filled 2014-10-06 (×5): qty 1

## 2014-10-06 NOTE — Progress Notes (Signed)
Patient ID: Donna Black, female   DOB: 1965-07-22, 49 y.o.   MRN: [id number] [facility] MD Progress Note  10/06/2014 5:29 PM Donna Black  MRN:  [id number] Subjective:   Patient states she is feeling better. She does not endorse medication side effects. She states she still feels she has been monitored by her husband , but is better able to challenge these thoughts and to discuss them , " rather than hide them like I had been doing". She states she has continued to " hear those clicking sounds" , but to a lesser degree . States she had a good phone conversation  With her children yesterday, which has helped her feel better as well. Overall states her mood is better.   Objective : I have met with patient and have reviewed  Chart notes. Patient is improved compared to admission- she is not endorsing severe depressive symptoms, she seems less guarded, with a more relaxed demeanor, and she is able , as above, to discuss her paranoid symptoms without becoming guarded or defensive . Visible on unit- no disruptive or agitated behaviors . She denies medication  Side effects- prefers for medications to be QHS to minimize sedation. At this time is fully alert and attentive . She does not appear internally preoccupied at this time but reports ongoing auditory hallucinations. Participates in groups.   Principal Problem: MDD (major depressive disorder), recurrent, severe, with psychosis Diagnosis:   Patient Active Problem List   Diagnosis Date Noted  . MDD (major depressive disorder), recurrent, severe, with psychosis [F33.3] 10/03/2014  . HX: breast cancer [Z85.3] 10/03/2014  . Hx of Graves' disease [Z86.39] 10/03/2014  . Closed bimalleolar fracture of right ankle [S82.841A] 09/18/2013  . Bimalleolar fracture [J62.831D] 09/18/2013  . 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 Tobacco abuse [Z72.0] 03/31/2011  . 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 Wears glasses   . Depression   . Heart murmur     told off and on in past that she has had one  . Grave's disease     diagnosed about 7 yrs ago but after a year she has been doing well  . Anemia     when she was younger  . Complication of anesthesia     ?, heart rate dropped w/wisdom teeth  . Cancer 2013    bilateral breast cancer  . Pneumonia   . GERD (gastroesophageal reflux disease)     with pregnancy only  . High cholesterol     Past Surgical History  Procedure Laterality Date  . Cholecystectomy    . Wisdom tooth extraction    . Breast fibroadenoma surgery  1987, 2000    bilateral  . Breast reconstruction  05/12/2011    Procedure: BREAST RECONSTRUCTION;  Surgeon: Crissie Reese, MD;  Location: [place];  Service: Plastics;  Laterality: Bilateral;  PLACEMENT OF BILATERAL TISSUE EXPANDERS WITH POSSIBLE USE OF HD FLEX  . Cesarean section      2001,cyst; 2002 birth  . Orif ankle fracture Right 09/18/2013    Procedure: OPEN REDUCTION INTERNAL FIXATION (ORIF) RIGHT ANKLE FRACTURE;  Surgeon: Mcarthur Rossetti, MD;  Location: [place];  Service: Orthopedics;  Laterality: Right;  . Breast reconstruction    . Mastectomy Bilateral    Family History:  Family History  Problem Relation Age of Onset  . Breast cancer Maternal  Grandmother   . Cancer Maternal Grandmother     breast  . Heart disease Mother   . Stroke Mother   . COPD Mother   . Diabetes Mother   . Heart disease Father    Social History:  History  Alcohol Use  . 1.2 oz/week  . 2 Glasses of wine per week    Comment: sometimes daily or can just be weekends     History  Drug Use No    Social History   Social History  . Marital Status: Married    Spouse Name: N/A  . Number of Children: N/A  . Years of Education: N/A   Social History Main Topics  . Smoking status: Current Some Day Smoker -- 1.00 packs/day    Types:  Cigarettes    Last Attempt to Quit: 10/01/2013  . Smokeless tobacco: Never Used  . Alcohol Use: 1.2 oz/week    2 Glasses of wine per week     Comment: sometimes daily or can just be weekends  . Drug Use: No  . Sexual Activity: Yes    Birth Control/ Protection: Condom   Other Topics Concern  . None   Social History Narrative   Additional History:    Sleep: Good  Appetite:  Good   Assessment:   Musculoskeletal: Strength & Muscle Tone: within normal limits Gait & Station: normal Patient leans: N/A   Psychiatric Specialty Exam: Physical Exam  ROS denies headache, denies vomiting. denies chest pain, denies shortness of breath  Blood pressure 115/74, pulse 88, temperature 98.4 F (36.9 C), temperature source Oral, resp. rate 20, height 5' 5" (1.651 m), weight 134 lb (60.782 kg), last menstrual period 09/23/2014.Body mass index is 22.3 kg/(m^2).  General Appearance: improved grooming   Eye Contact::  Good  Speech:  Normal Rate  Volume:  Normal  Mood:  Improved, today denies depression  Affect:  more reactive , less guarded   Thought Process:  Linear- no thought disorder is noted   Orientation:  Other:  fully alert and attentive   Thought Content:  (+) hallucinations and paranoid ideations as above , not internally preoccupied at this time  Suicidal Thoughts:  No- denies any thoughts of hurting self or of SI  Homicidal Thoughts:  No  Memory:  recent and remote grossly intact   Judgement:   Improving   Insight:   Improving   Psychomotor Activity:  Normal  Concentration:  Good  Recall:  Good  Fund of Knowledge:Good  Language: Good  Akathisia:  Negative  Handed:  Right  AIMS (if indicated):     Assets:  Desire for Improvement Resilience  ADL's:  Fair   Cognition: WNL  Sleep:  Number of Hours: 6.75     Current Medications: Current Facility-Administered Medications  Medication Dose Route Frequency Provider Last Rate Last Dose  . acetaminophen (TYLENOL) tablet  650 mg  650 mg Oral Q6H PRN Laverle Hobby, PA-C      . alum & mag hydroxide-simeth (MAALOX/MYLANTA) 200-200-20 MG/5ML suspension 30 mL  30 mL Oral Q4H PRN Laverle Hobby, PA-C      . amLODipine (NORVASC) tablet 5 mg  5 mg Oral Daily Laverle Hobby, PA-C   5 mg at 10/03/14 0756  . ARIPiprazole (ABILIFY) tablet 10 mg  10 mg Oral QHS Myer Peer Cobos, MD      . clonazePAM (KLONOPIN) tablet 0.5 mg  0.5 mg Oral BID PRN Ursula Alert, MD   0.5 mg at 10/05/14 2117  .  lamoTRIgine (LAMICTAL) tablet 150 mg  150 mg Oral Daily Laverle Hobby, PA-C   150 mg at 10/06/14 0810  . magnesium hydroxide (MILK OF MAGNESIA) suspension 30 mL  30 mL Oral Daily PRN Laverle Hobby, PA-C      . traZODone (DESYREL) tablet 100 mg  100 mg Oral QHS,MR X 1 Myer Peer Cobos, MD   100 mg at 10/05/14 2115  . venlafaxine XR (EFFEXOR-XR) 24 hr capsule 75 mg  75 mg Oral Q breakfast Jenne Campus, MD   75 mg at 10/06/14 0809    Lab Results:  No results found for this or any previous visit (from the past 42 hour(s)).  Physical Findings: AIMS: Facial and Oral Movements Muscles of Facial Expression: None, normal Lips and Perioral Area: None, normal Jaw: None, normal Tongue: None, normal,Extremity Movements Upper (arms, wrists, hands, fingers): None, normal Lower (legs, knees, ankles, toes): None, normal, Trunk Movements Neck, shoulders, hips: None, normal, Overall Severity Severity of abnormal movements (highest score from questions above): None, normal Incapacitation due to abnormal movements: None, normal Patient's awareness of abnormal movements (rate only patient's report): No Awareness, Dental Status Current problems with teeth and/or dentures?: No Does patient usually wear dentures?: No  CIWA:  CIWA-Ar Total: 0 COWS:  COWS Total Score: 1   Assessment - continues to gradually improve- continues to endorse paranoid ideations and hallucinations but is more relaxed in demeanor and less guarded. Thought process is  linear and not disordered. She is tolerating medications well at present . Head MRI pending- scheduled for tomorrow.   Treatment Plan Summary: Daily contact with patient to assess and evaluate symptoms and progress in treatment, Medication management, Plan inpatient treatment and medications as below Increase  Abilify  To 15 mgrs QHS for psychosis and mood disorder  Continue  Effexor XR 75 mgrsd QDAY for depression.  Continue Lamictal 150 mgrs QDAY for depression/mood disorder  Continue Trazodone 50 mgrs QHS PRN for insomnia Head MRI pending for tomorrow  -  Rationale is related to  Having  psychotic symptoms x 1-2 years and having a  prior history of breast CA.  Obtain EKG- routine as on antipsychotic .   Medical Decision Making:  Established Problem, Stable/Improving (1), Review of Psycho-Social Stressors (1), Review or order clinical lab tests (1), Review of Medication Regimen & Side Effects (2) and Review of New Medication or Change in Dosage (2)     COBOS, FERNANDO 10/06/2014, 5:29 PM

## 2014-10-06 NOTE — Plan of Care (Signed)
Problem: Ineffective individual coping Goal: LTG: Patient will report a decrease in negative feelings Outcome: Progressing Donna Black says that she is feeling " much better" today and this is evidenced by  her presence in the dayroom ( she has not isolated today), her participation in her groups, her documentation on her daily assessment that she denies suicidality today.

## 2014-10-06 NOTE — [facility] Group Notes (Signed)
[place] Group Notes:  (Clinical Social Work)  10/06/2014  1:15-2:15PM  Summary of Progress/Problems:   The main focus of today's process group was to   1)  discuss the importance of adding supports  2)  define health supports versus unhealthy supports  3)  identify the patient's current unhealthy supports and plan how to handle them  4)  Identify the patient's current healthy supports and plan what to add.  An emphasis was placed on using counselor, doctor, therapy groups, 12-step groups, and problem-specific support groups to expand supports.    The patient expressed full comprehension of the concepts presented, and agreed that there is a need to add more supports.  The patient stated her adult daughters are good supports for her, and they actually reached out to her doctor when she herself would not, resulting in her hospitalization.  She was attentive, did not talk much, but was more interested than yesterday.  Type of Therapy:  Process Group with Motivational Interviewing  Participation Level:  Active  Participation Quality:  Attentive  Affect:  Blunted  Cognitive:  Alert  Insight:  Developing/Improving  Engagement in Therapy:  Engaged  Modes of Intervention:   Education, Support and Processing, Activity  Selmer Dominion, LCSW 10/06/2014

## 2014-10-06 NOTE — Progress Notes (Signed)
Psychoeducational Group Note  Date:  10/06/2014 Time:  1015  Group Topic/Focus:  Making Healthy Choices:   The focus of this group is to help patients identify negative/unhealthy choices they were using prior to admission and identify positive/healthier coping strategies to replace them upon discharge.  Participation Level:  Active  Participation Quality:  Appropriate  Affect:  Appropriate  Cognitive:  Oriented  Insight:  Engaged  Engagement in Group:  Engaged  Additional Comments:    Austynn Pridmore A 10/06/2014 

## 2014-10-06 NOTE — Progress Notes (Signed)
Psychoeducational Group Note  Psychoeducational Group Note  Date: 10/06/2014 Time:  0930 Group Topic/Focus:  Gratefulness:  The focus of this group is to help patients identify what two things they are most grateful for in their lives. What helps ground them and to center them on their work to their recovery.  Participation Level:  Active  Participation Quality:  Appropriate  Affect:  Appropriate  Cognitive:  Oriented  Insight:  Improving  Engagement in Group:  Engaged  Additional Comments:    Pharrell Ledford A  

## 2014-10-06 NOTE — Progress Notes (Signed)
Adult Psychoeducational Group Note  Date:  10/06/2014 Time:  800pm  Group Topic/Focus:  Wrap-Up Group:   The focus of this group is to help patients review their daily goal of treatment and discuss progress on daily workbooks.  Participation Level:  Active  Participation Quality:  Appropriate  Affect:  Appropriate  Cognitive:  Appropriate  Insight: Appropriate  Engagement in Group:  Engaged  Modes of Intervention:  Discussion  Additional Comments:  Patient was asked to describe her day using only one word.  Patient reported her day was "good".  Patient reported she was able to realize a bunch of stuff.  Patient was vague on elaborating.  Janice Coffin A 10/06/2014, 10:08 PM

## 2014-10-07 ENCOUNTER — Inpatient Hospital Stay (HOSPITAL_COMMUNITY)
Admission: AD | Admit: 2014-10-07 | Discharge: 2014-10-07 | Disposition: A | Discharge status: Home / Self Care | Payer: BLUE CROSS/BLUE SHIELD | Source: Home / Self Care | Attending: Psychiatry | Admitting: Psychiatry

## 2014-10-07 ENCOUNTER — Ambulatory Visit (HOSPITAL_COMMUNITY): Payer: Self-pay | Admitting: Psychiatry

## 2014-10-07 MED ORDER — TRAZODONE HCL 100 MG PO TABS
100.0000 mg | ORAL_TABLET | Freq: Every evening | ORAL | Status: DC | PRN
  Administered 2014-10-07: 100 mg via ORAL
  Filled 2014-10-07: qty 3
  Filled 2014-10-07: qty 1

## 2014-10-07 MED ORDER — GADOBENATE DIMEGLUMINE 529 MG/ML IV SOLN
15.0000 mL | Freq: Once | INTRAVENOUS | Status: AC | PRN
  Administered 2014-10-07: 12 mL via INTRAVENOUS

## 2014-10-07 NOTE — BHH Group Notes (Signed)
[place] LCSW Group Therapy  10/07/2014 1:15pm  Type of Therapy:  Group Therapy vercoming Obstacles  Participation Level:  Appropriate  Participation Quality:  Reserved  Affect:  Flat  Cognitive:  Appropriate and Oriented  Insight:  Developing/Improving and Improving  Engagement in Therapy:  Improving  Modes of Intervention:  Discussion, Exploration, Problem-solving and Support  Description of Group:   In this group patients will be encouraged to explore what they see as obstacles to their own wellness and recovery. They will be guided to discuss their thoughts, feelings, and behaviors related to these obstacles. The group will process together ways to cope with barriers, with attention given to specific choices patients can make. Each patient will be challenged to identify changes they are motivated to make in order to overcome their obstacles. This group will be process-oriented, with patients participating in exploration of their own experiences as well as giving and receiving support and challenge from other group members.  Summary of Patient Progress: Pt participated when prompted in group therapy, otherwise remained reserved. She identified her current goal as having trusting relationships and describes her "fears" as an obstacle to creating these relationships. Pt unable to identify necessary changes to overcome this obstacle.   Therapeutic Modalities:   Cognitive Behavioral Therapy Solution Focused Therapy Motivational Interviewing Relapse Prevention Therapy   Peri Maris, LCSWA 10/07/2014 3:41 PM

## 2014-10-07 NOTE — Progress Notes (Signed)
Patient has been up in the dayroom watching tv, attended group this evening and is compliant with scheduled medications. Patient currently denies having pain, -si/hi/a/v hall. She inquired about her medications and received a print out.Support and encouragement offered, safety maintained on unit, will continue to monitor.

## 2014-10-07 NOTE — Plan of Care (Signed)
Problem: Alteration in mood Goal: STG-Patient reports thoughts of self-harm to staff Outcome: Progressing Patient denies thoughts of SI and has not engaged in self harm.  Problem: Diagnosis: Increased Risk For Suicide Attempt Goal: STG-Patient Will Comply With Medication Regime Outcome: Progressing Patient is med compliant.

## 2014-10-07 NOTE — Progress Notes (Signed)
Patient ID: Donna Black, female   DOB: 06/14/1965, 49 y.o.   MRN: [id number] [facility] MD Progress Note  10/07/2014 12:38 PM Donna Black  MRN:  [id number] Subjective:  Patient reports partial improvement. She states she feels she is " learning about myself ", and states she feels more relaxed and at ease. She states she does continue to have auditory hallucinations, such as hearing " clicking sounds", but denies any actual voices . She denies any visual halls. She continues to have paranoid ideations about her husband's monitoring her , and states " sometimes I feel it is happening here too", but states she is gaining insight into this , and that she is starting to see " that it could all be in my head".  Currently describes some dizziness, lightheadedness, but gait steady. (She had episode of hypotension this AM)   Objective : I have met with patient and have discussed case with treatment team.  Patient continues to improve compared to her admission. As noted, she has chronic psychotic symptoms, which are improved but persist even as her mood improves. She is making progress- intensity of psychotic symptoms has decreased and she is gaining insight into her mental illness . Visible on unit- no disruptive or agitated behaviors . She is going to groups.  Some dizziness earlier today- gait steady- denies falls. BP low this AM, now improved. She had recently been started on Norvasc, and Abilify can also be associated with orthostatic hypotension. Continues to respond well to support, encouragement, review of importance of medication compliance after discharge.   Principal Problem: MDD (major depressive disorder), recurrent, severe, with psychosis Diagnosis:   Patient Active Problem List   Diagnosis Date Noted  . MDD (major depressive disorder), recurrent, severe, with psychosis [F33.3] 10/03/2014  . HX: breast cancer [Z85.3] 10/03/2014  . Hx of Graves' disease [Z86.39] 10/03/2014  . Closed  bimalleolar fracture of right ankle [S82.841A] 09/18/2013  . Bimalleolar fracture [B76.283T] 09/18/2013  . 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 Tobacco abuse [Z72.0] 03/31/2011  . 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 Wears glasses   . Depression   . Heart murmur     told off and on in past that she has had one  . Grave's disease     diagnosed about 7 yrs ago but after a year she has been doing well  . Anemia     when she was younger  . Complication of anesthesia     ?, heart rate dropped w/wisdom teeth  . Cancer 2013    bilateral breast cancer  . Pneumonia   . GERD (gastroesophageal reflux disease)     with pregnancy only  . High cholesterol     Past Surgical History  Procedure Laterality Date  . Cholecystectomy    . Wisdom tooth extraction    . Breast fibroadenoma surgery  1987, 2000    bilateral  . Breast reconstruction  05/12/2011    Procedure: BREAST RECONSTRUCTION;  Surgeon: Crissie Reese, MD;  Location: [place];  Service: Plastics;  Laterality: Bilateral;  PLACEMENT OF BILATERAL TISSUE EXPANDERS WITH POSSIBLE USE OF HD FLEX  . Cesarean section      2001,cyst; 2002 birth  . Orif ankle fracture Right 09/18/2013    Procedure: OPEN REDUCTION INTERNAL FIXATION (ORIF) RIGHT ANKLE FRACTURE;  Surgeon: Mcarthur Rossetti, MD;  Location:  [facility] [facility];  Service: Orthopedics;  Laterality: Right;  . Breast reconstruction    . Mastectomy Bilateral    Family History:  Family History  Problem Relation Age of Onset  . Breast cancer Maternal Grandmother   . Cancer Maternal Grandmother     breast  . Heart disease Mother   . Stroke Mother   . COPD Mother   . Diabetes Mother   . Heart disease Father    Social History:  History  Alcohol Use  . 1.2 oz/week  . 2 Glasses of wine per week    Comment: sometimes daily or can just be weekends      History  Drug Use No    Social History   Social History  . Marital Status: Married    Spouse Name: N/A  . Number of Children: N/A  . Years of Education: N/A   Social History Main Topics  . Smoking status: Current Some Day Smoker -- 1.00 packs/day    Types: Cigarettes    Last Attempt to Quit: 10/01/2013  . Smokeless tobacco: Never Used  . Alcohol Use: 1.2 oz/week    2 Glasses of wine per week     Comment: sometimes daily or can just be weekends  . Drug Use: No  . Sexual Activity: Yes    Birth Control/ Protection: Condom   Other Topics Concern  . None   Social History Narrative   Additional History:    Sleep: Good  Appetite:  Good   Assessment:   Musculoskeletal: Strength & Muscle Tone: within normal limits Gait & Station: normal Patient leans: N/A   Psychiatric Specialty Exam: Physical Exam  ROS  Today reports dizziness earlier today, now improved . No headache, no chest pain, no SOB, no syncope   Blood pressure 80/49, pulse 112, temperature 98.7 F (37.1 C), temperature source Oral, resp. rate 16, height _0  (1.651 m), weight 134 lb (60.782 kg), last menstrual period 09/23/2014.Body mass index is 22.3 kg/(m^2).  General Appearance: improved grooming   Eye Contact::  Good  Speech:  Normal Rate  Volume:  Normal  Mood:  Improved   Affect:  Reactive , pleasant   Thought Process:  Linear- no thought disorder is noted   Orientation:  Other:  fully alert and attentive   Thought Content:  (+) hallucinations and paranoid ideations as above , not internally preoccupied at this time  Suicidal Thoughts:  No- denies any thoughts of hurting self or of SI  Homicidal Thoughts:  No  Memory:  recent and remote grossly intact   Judgement:   Improving   Insight:   Improving   Psychomotor Activity:  Normal  Concentration:  Good  Recall:  Good  Fund of Knowledge:Good  Language: Good  Akathisia:  Negative  Handed:  Right  AIMS (if indicated):     Assets:  Desire for  Improvement Resilience  ADL's:  Fair   Cognition: WNL  Sleep:  Number of Hours: 6.5     Current Medications: Current Facility-Administered Medications  Medication Dose Route Frequency Provider Last Rate Last Dose  . acetaminophen (TYLENOL) tablet 650 mg  650 mg Oral Q6H PRN Laverle Hobby, PA-C      . alum & mag hydroxide-simeth (MAALOX/MYLANTA) 200-200-20 MG/5ML suspension 30 mL  30 mL Oral Q4H PRN Laverle Hobby, PA-C      . ARIPiprazole (ABILIFY) tablet 15 mg  15 mg Oral QHS Jenne Campus, MD   15 mg at 10/06/14 2136  .  clonazePAM (KLONOPIN) tablet 0.5 mg  0.5 mg Oral BID PRN Ursula Alert, MD   0.5 mg at 10/05/14 2117  . lamoTRIgine (LAMICTAL) tablet 150 mg  150 mg Oral Daily Laverle Hobby, PA-C   150 mg at 10/07/14 0825  . magnesium hydroxide (MILK OF MAGNESIA) suspension 30 mL  30 mL Oral Daily PRN Laverle Hobby, PA-C      . traZODone (DESYREL) tablet 100 mg  100 mg Oral QHS PRN Jenne Campus, MD      . venlafaxine XR (EFFEXOR-XR) 24 hr capsule 75 mg  75 mg Oral Q breakfast Jenne Campus, MD   75 mg at 10/07/14 0825    Lab Results:  No results found for this or any previous visit (from the past 51 hour(s)).  Physical Findings: AIMS: Facial and Oral Movements Muscles of Facial Expression: None, normal Lips and Perioral Area: None, normal Jaw: None, normal Tongue: None, normal,Extremity Movements Upper (arms, wrists, hands, fingers): None, normal Lower (legs, knees, ankles, toes): None, normal, Trunk Movements Neck, shoulders, hips: None, normal, Overall Severity Severity of abnormal movements (highest score from questions above): None, normal Incapacitation due to abnormal movements: None, normal Patient's awareness of abnormal movements (rate only patient's report): No Awareness, Dental Status Current problems with teeth and/or dentures?: No Does patient usually wear dentures?: No  CIWA:  CIWA-Ar Total: 0 COWS:  COWS Total Score: 1   Assessment -improved  mood and affect compared to admission. Chronic psychotic symptoms persist but seem to be improving gradually and in particular is gaining insight into mental illness , and into importance of medication compliance . At this time no SI, and mood improved . Episode of hypotension today. We discussed- will D/C norvasc . Agrees to continue Abilify as has improved on this medication, has been well tolerated, and has not caused dizziness even at higher doses prior to this admission.   Treatment Plan Summary: Daily contact with patient to assess and evaluate symptoms and progress in treatment, Medication management, Plan inpatient treatment and medications as below Continue   Abilify   15 mgrs QHS for psychosis and mood disorder  Continue  Effexor XR 75 mgrsd QDAY for depression.  Continue Lamictal 150 mgrs QDAY for depression/mood disorder  Continue Trazodone 50 mgrs QHS PRN for insomnia She has Head MRI pending for  Later today-  Rationale is related to  Having  psychotic symptoms x 1-2 years and having a  prior history of breast CA.  Encourage/ Push PO fluids .    Medical Decision Making:  Established Problem, Stable/Improving (1), Review of Psycho-Social Stressors (1), Review or order clinical lab tests (1), Review of Medication Regimen & Side Effects (2) and Review of New Medication or Change in Dosage (2)     COBOS, FERNANDO 10/07/2014, 12:38 PM

## 2014-10-07 NOTE — Progress Notes (Signed)
D. Pt has been up and active in milieu this evening, did attend and participate in evening group activity. Pt seen interacting appropriately with fellow peers in milieu, has appeared somewhat depressed this evening although reports that she is feeling better. Pt did receive medications without incident and did not verbalize any complaints. A. Support and encouragement provided. R. Safety maintained, will continue to monitor.

## 2014-10-07 NOTE — Plan of Care (Signed)
Problem: Alteration in mood Goal: LTG-Patient reports reduction in suicidal thoughts (Patient reports reduction in suicidal thoughts and is able to verbalize a safety plan for whenever patient is feeling suicidal)  Outcome: Progressing Patient currently denies suicidal thoughts.

## 2014-10-07 NOTE — Progress Notes (Signed)
Recreation Therapy Notes  Date: 08.15.2016 Time: 9:30am  Location: [address] Group Room   Group Topic: Stress Management  Goal Area(s) Addresses:  Patient will actively participate in stress management techniques presented during session.   Behavioral Response: Did not attend.   Laureen Ochs Jareli Highland, LRT/CTRS        Duvan Mousel L 10/07/2014 3:07 PM

## 2014-10-07 NOTE — BHH Group Notes (Signed)
[facility] LCSW Aftercare Discharge Planning Group Note  10/07/2014 8:45 AM  Participation Quality: Alert, Appropriate and Oriented  Mood/Affect: Flat  Depression Rating: 0  Anxiety Rating: "it's up and down"  Thoughts of Suicide: Pt denies SI/HI  Will you contract for safety? Yes  Current AVH: Pt denies  Plan for Discharge/Comments: Pt attended discharge planning group and actively participated in group. CSW discussed suicide prevention education with the group and encouraged them to discuss discharge planning and any relevant barriers. Pt continues to present with low volume of speech and delayed processing speed. She reports feeling "okay" and that she feels that she is ready for DC.  Transportation Means: Pt reports access to transportation  Supports: No supports mentioned at this time  Peri Maris, [place] 10/07/2014 9:14 AM

## 2014-10-07 NOTE — Progress Notes (Signed)
[facility] Group Notes:  (Nursing/MHT/Case Management/Adjunct)  Date:  10/07/2014  Time:  11:37 PM  Type of Therapy:  Group Therapy  Participation Level:  Active  Participation Quality:  Appropriate  Affect:  Appropriate  Cognitive:  Appropriate  Insight:  Appropriate  Engagement in Group:  Engaged  Modes of Intervention:  Socialization and Support  Summary of Progress/Problems: Pt. Was engaged and had good insight in group discussion.  Pt. Stated walking and hiking were coping skills.  Lanell Persons 10/07/2014, 11:37 PM

## 2014-10-07 NOTE — Progress Notes (Signed)
Met with patient 1:1. Patient blunted in affect with depressed mood. Patient somewhat superficial and paranoid thoughts not evident. She does endorse AH in the form of clicking noises. No VH. Rates depression, anxiety and hopelessness all at a 0/10. States her goal for today is to "learn more about myself." Medicated per orders. Discussed patient's care, progress with MD, tx team. Patient given support and reassurance.  Patient denies SI/HI. Sent for MRI this morning with staff and awaiting her return. Jamie Kato

## 2014-10-08 MED ORDER — ARIPIPRAZOLE 10 MG PO TABS
20.0000 mg | ORAL_TABLET | Freq: Every day | ORAL | Status: DC
  Administered 2014-10-08: 20 mg via ORAL
  Filled 2014-10-08 (×3): qty 2

## 2014-10-08 NOTE — Progress Notes (Signed)
D: Pt presents anxious on approach this morning. Pt stated that she plans to d/c home today and f/u with Dr. Adele Schilder outpt. Pt denies depression and suicidal thoughts. Pt rates anxiety /10. Pt reported that her b/p has been decreasing d/t meds and that she made Cobos, MD., aware. Pt v/s reviewed. No complaints of dizziness. Pt encouraged to increase fluid intake. Pt reported that she's tolerating meds well.  A: Medications administered as ordered per MD. Verbal support given. Pt encouraged to attend groups. 15 minute checks performed for safety. R: Pt stated goal is "be strong". Pt safety maintained.

## 2014-10-08 NOTE — Progress Notes (Signed)
Recreation Therapy Notes  Animal-Assisted Activity (AAA) Program Checklist/Progress Notes Patient Eligibility Criteria Checklist & Daily Group note for Rec Tx Intervention  Date: 08.16.2016 Time: 2:45pm Location: [address]    AAA/T Program Assumption of Risk Form signed by Patient/ or Parent Legal Guardian yes  Patient is free of allergies or sever asthma yes  Patient reports no fear of animals yes  Patient reports no history of cruelty to animals yes  Patient understands his/her participation is voluntary yes  Behavioral Response: Did not attend.    Laureen Ochs Manuel [place], LRT/CTRS        Jedrick Hutcherson L 10/08/2014 3:13 PM

## 2014-10-08 NOTE — BHH Suicide Risk Assessment (Signed)
[facility] INPATIENT:  Family/Significant Other Suicide Prevention Education  Suicide Prevention Education:  Patient Refusal for Family/Significant Other Suicide Prevention Education: The patient Donna Black has refused to provide written consent for family/significant other to be provided Family/Significant Other Suicide Prevention Education during admission and/or prior to discharge.  Physician notified. SPE reviewed with patient and brochure provided. Patient encouraged to return to hospital if having suicidal thoughts, patient verbalized his/her understanding and has no further questions at this time.   Bo Mcclintock 10/08/2014, 3:30 PM

## 2014-10-08 NOTE — Progress Notes (Signed)
Pt requested CBG be taken a noon time. Pt stated it was taken several times yesterday 8/15. Pt asymptomatic. Pt reported tremors but reported that she always tremulous. Pt given yogurt and lunch. Mickel Baas, Np., made aware. Will continue to monitor pt. Will check CBG if pt becomes symptomatic.

## 2014-10-08 NOTE — BHH Group Notes (Signed)
[facility] LCSW Group Therapy 10/08/2014 1:15 PM  Type of Therapy: Group Therapy- Feelings about Diagnosis  Participation Level: Minimal   Participation Quality:  Reserved  Affect:  Blunted  Cognitive: Alert and Oriented   Insight:  Limited  Engagement in Therapy: Limited   Modes of Intervention: Clarification, Confrontation, Discussion, Education, Exploration, Limit-setting, Orientation, Problem-solving, Rapport Building, Art therapist, Socialization and Support  Description of Group:   This group will allow patients to explore their thoughts and feelings about diagnoses they have received. Patients will be guided to explore their level of understanding and acceptance of these diagnoses. Facilitator will encourage patients to process their thoughts and feelings about the reactions of others to their diagnosis, and will guide patients in identifying ways to discuss their diagnosis with significant others in their lives. This group will be process-oriented, with patients participating in exploration of their own experiences as well as giving and receiving support and challenge from other group members.  Summary of Progress/Problems:  Pt continues to participate minimally in group and is observed to be anxious. She was able to identify that she is a good cook and others report that she is a good listener.  Therapeutic Modalities:   Cognitive Behavioral Therapy Solution Focused Therapy Motivational Interviewing Relapse Prevention Therapy  Peri Maris, LCSWA 10/08/2014 3:04 PM

## 2014-10-08 NOTE — Progress Notes (Signed)
Patient ID: Donna Black, female   DOB: 11-07-1965, 49 y.o.   MRN: [id number] [facility] MD Progress Note  10/08/2014 4:41 PM Donna Black  MRN:  [id number] Subjective:   Patient has reported she is doing "OK". She minimizes/denies any symptoms of depression or sadness. She  Does continue to report ongoing psychotic symptoms.  She has a chronic delusion that she is being monitored, probably taped or photographed. She also often hears " clicks ". She sometimes hears stranger's conversations and some of the things they say seem to confirm her suspicion that she is being watched. She cannot provide any specific example and states that at times it" is more like a feeling than anything else ". She is now better able to discuss these symptoms and appears less guarded . She also states she realizes these issues seem " strange " and states " I am trying to accept that they may be a product of my own brain but it is hard, because it is so real".  She does not endorse any medication side effects. Yesterday had felt dizzy but today denies dizziness or lightheadedness .  Objective : I have met with patient and have discussed case with treatment team.  Improved compared to admission, but remains delusional , as described above . Age of onset,  The lack of negative symptoms , her generally preserved personality and  Ongoing ability to function well in daily life activities  Suggest delusional disorder . Of note, Head MRI is negative /unremarkable .  On unit she is pleasant, polite, calm, and participates in groups .  No disruptive or agitated behaviors . As noted, yesterday had complained of some dizziness but this is improved at this time.    Principal Problem: MDD (major depressive disorder), recurrent, severe, with psychosis Diagnosis:   Patient Active Problem List   Diagnosis Date Noted  . MDD (major depressive disorder), recurrent, severe, with psychosis [F33.3] 10/03/2014  . HX: breast cancer  [Z85.3] 10/03/2014  . Hx of Graves' disease [Z86.39] 10/03/2014  . Closed bimalleolar fracture of right ankle [S82.841A] 09/18/2013  . Bimalleolar fracture [V78.588F] 09/18/2013  . 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 Tobacco abuse [Z72.0] 03/31/2011  . 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 Wears glasses   . Depression   . Heart murmur     told off and on in past that she has had one  . Grave's disease     diagnosed about 7 yrs ago but after a year she has been doing well  . Anemia     when she was younger  . Complication of anesthesia     ?, heart rate dropped w/wisdom teeth  . Cancer 2013    bilateral breast cancer  . Pneumonia   . GERD (gastroesophageal reflux disease)     with pregnancy only  . High cholesterol     Past Surgical History  Procedure Laterality Date  . Cholecystectomy    . Wisdom tooth extraction    . Breast fibroadenoma surgery  1987, 2000    bilateral  . Breast reconstruction  05/12/2011    Procedure: BREAST RECONSTRUCTION;  Surgeon: Crissie Reese, MD;  Location: [facility];  Service: Plastics;  Laterality: Bilateral;  PLACEMENT OF BILATERAL TISSUE EXPANDERS WITH POSSIBLE USE OF HD FLEX  . Cesarean section      2001,cyst; 2002  birth  . Orif ankle fracture Right 09/18/2013    Procedure: OPEN REDUCTION INTERNAL FIXATION (ORIF) RIGHT ANKLE FRACTURE;  Surgeon: Mcarthur Rossetti, MD;  Location: [facility];  Service: Orthopedics;  Laterality: Right;  . Breast reconstruction    . Mastectomy Bilateral    Family History:  Family History  Problem Relation Age of Onset  . Breast cancer Maternal Grandmother   . Cancer Maternal Grandmother     breast  . Heart disease Mother   . Stroke Mother   . COPD Mother   . Diabetes Mother   . Heart disease Father    Social History:  History  Alcohol Use  . 1.2 oz/week  . 2 Glasses  of wine per week    Comment: sometimes daily or can just be weekends     History  Drug Use No    Social History   Social History  . Marital Status: Married    Spouse Name: N/A  . Number of Children: N/A  . Years of Education: N/A   Social History Main Topics  . Smoking status: Current Some Day Smoker -- 1.00 packs/day    Types: Cigarettes    Last Attempt to Quit: 10/01/2013  . Smokeless tobacco: Never Used  . Alcohol Use: 1.2 oz/week    2 Glasses of wine per week     Comment: sometimes daily or can just be weekends  . Drug Use: No  . Sexual Activity: Yes    Birth Control/ Protection: Condom   Other Topics Concern  . None   Social History Narrative   Additional History:    Sleep: Good  Appetite:  Good   Assessment:   Musculoskeletal: Strength & Muscle Tone: within normal limits Gait & Station: normal Patient leans: N/A   Psychiatric Specialty Exam: Physical Exam  ROS  Today no significant dizziness/ light headedness.  No headache, no chest pain, no SOB, no syncope   Blood pressure 100/67, pulse 110, temperature 98.4 F (36.9 C), temperature source Oral, resp. rate 18, height _0  (1.651 m), weight 134 lb (60.782 kg), last menstrual period 09/23/2014.Body mass index is 22.3 kg/(m^2).  General Appearance: improved grooming   Eye Contact::  Good  Speech:  Normal Rate  Volume:  Normal  Mood:  Improved - at this time denies depression.   Affect:  Reactive , pleasant   Thought Process:  Linear- no thought disorder is noted   Orientation:  Other:  fully alert and attentive   Thought Content:  (+) hallucinations and paranoid ideations as above , not internally preoccupied at this time  Suicidal Thoughts:  No- denies any thoughts of hurting self or of SI  Homicidal Thoughts:  No- also , specifically denies any violent or homicidal ideations towards husband or any family member  Memory:  recent and remote grossly intact   Judgement:   Improving   Insight:    Improving   Psychomotor Activity:  Normal  Concentration:  Good  Recall:  Good  Fund of Knowledge:Good  Language: Good  Akathisia:  Negative  Handed:  Right  AIMS (if indicated):     Assets:  Desire for Improvement Resilience  ADL's:  Fair   Cognition: WNL  Sleep:  Number of Hours: 6.75     Current Medications: Current Facility-Administered Medications  Medication Dose Route Frequency Provider Last Rate Last Dose  . acetaminophen (TYLENOL) tablet 650 mg  650 mg Oral Q6H PRN Laverle Hobby, PA-C      . alum &  mag hydroxide-simeth (MAALOX/MYLANTA) 200-200-20 MG/5ML suspension 30 mL  30 mL Oral Q4H PRN Laverle Hobby, PA-C      . ARIPiprazole (ABILIFY) tablet 15 mg  15 mg Oral QHS Jenne Campus, MD   15 mg at 10/07/14 2127  . clonazePAM (KLONOPIN) tablet 0.5 mg  0.5 mg Oral BID PRN Ursula Alert, MD   0.5 mg at 10/05/14 2117  . lamoTRIgine (LAMICTAL) tablet 150 mg  150 mg Oral Daily Laverle Hobby, PA-C   150 mg at 10/08/14 0758  . magnesium hydroxide (MILK OF MAGNESIA) suspension 30 mL  30 mL Oral Daily PRN Laverle Hobby, PA-C      . traZODone (DESYREL) tablet 100 mg  100 mg Oral QHS PRN Jenne Campus, MD   100 mg at 10/07/14 2127  . venlafaxine XR (EFFEXOR-XR) 24 hr capsule 75 mg  75 mg Oral Q breakfast Jenne Campus, MD   75 mg at 10/08/14 7014    Lab Results:  No results found for this or any previous visit (from the past 48 hour(s)).  Physical Findings: AIMS: Facial and Oral Movements Muscles of Facial Expression: None, normal Lips and Perioral Area: None, normal Jaw: None, normal Tongue: None, normal,Extremity Movements Upper (arms, wrists, hands, fingers): None, normal Lower (legs, knees, ankles, toes): None, normal, Trunk Movements Neck, shoulders, hips: None, normal, Overall Severity Severity of abnormal movements (highest score from questions above): None, normal Incapacitation due to abnormal movements: None, normal Patient's awareness of abnormal  movements (rate only patient's report): No Awareness, Dental Status Current problems with teeth and/or dentures?: No Does patient usually wear dentures?: No  CIWA:  CIWA-Ar Total: 0 COWS:  COWS Total Score: 1   Assessment - continues to present with chronic psychotic symptoms/persecutory delusion of being photographed, monitored, observed. Not suicidal , not homicidal, behavior calm/normal, and not disruptive. ALso, able to take care of ADLs. Threrefore, there are no current grounds for ongoing involuntary commitment but patient is agreeing to continue inpatient admission to allow for further titration of antipsychotic medication, particularly as had some orthostatic hypotension recently. We discussed options and at present prefers to continue Abilify trial, as has tolerated it well and has been on this medication in the past .   Treatment Plan Summary: Daily contact with patient to assess and evaluate symptoms and progress in treatment, Medication management, Plan inpatient treatment and medications as below Increase   Abilify   To 20 mgrs QHS  To address ongoing psychosis and mood disorder  Continue  Effexor XR 75 mgrsd QDAY for depression.  Continue Lamictal 150 mgrs QDAY for depression/mood disorder  Continue Trazodone 50 mgrs QHS PRN for insomnia Encourage/ Push PO fluids .  Consider discharge soon as she continues to improve    Medical Decision Making:  Established Problem, Stable/Improving (1), Review of Psycho-Social Stressors (1), Review or order clinical lab tests (1), Review of Medication Regimen & Side Effects (2) and Review of New Medication or Change in Dosage (2)     Donna Black 10/08/2014, 4:41 PM

## 2014-10-08 NOTE — Tx Team (Addendum)
Interdisciplinary Treatment Plan Update (Adult) Date: 10/08/2014   Date: 10/08/2014 10:38 AM  Progress in Treatment:  Attending groups: Yes Participating in groups: Yes, minimally Taking medication as prescribed: Yes  Tolerating medication: Yes  Family/Significant othe contact made: No, Pt declines family contact Patient understands diagnosis: No, limited insight; remains paranoid Discussing patient identified problems/goals with staff: Yes  Medical problems stabilized or resolved: Yes  Denies suicidal/homicidal ideation: Yes  Patient has not harmed self or Others: Yes   New problem(s) identified: None identified at this time.   Discharge Plan or Barriers: Pt will return home and follow-up with her established providers  Additional comments: n/a   Reason for Continuation of Hospitalization:  Anxiety Depression Medication stabilization Suicidal ideation Psychotic  Estimated length of stay: 0-1 days  Review of initial/current patient goals per problem list:   1.  Goal(s): Patient will participate in aftercare plan  Met:  Yes  Target date: 3-5 days from date of admission   As evidenced by: Patient will participate within aftercare plan AEB aftercare provider and housing plan at discharge being identified.   10/03/14: Pt will return home and follow-up with established providers.  2.  Goal (s): Patient will exhibit decreased depressive symptoms and suicidal ideations.  Met:  Yes  Target date: 3-5 days from date of admission   As evidenced by: Patient will utilize self rating of depression at 3 or below and demonstrate decreased signs of depression or be deemed stable for discharge by MD. 10/03/14: Pt was admitted with symptoms of depression, rating 10/10. Pt continues to present with flat affect and depressive symptoms.  Pt will demonstrate decreased symptoms of depression and rate depression at 3/10 or lower prior to discharge. 10/08/14: Pt denies symptoms of depression and  denies SI. Rates depression at 0/10.  3.  Goal(s): Patient will demonstrate decreased signs and symptoms of anxiety.  Met:  No  Target date: 3-5 days from date of admission   As evidenced by: Patient will utilize self rating of anxiety at 3 or below and demonstrated decreased signs of anxiety, or be deemed stable for discharge by MD 10/03/14: Pt was admitted with increased levels of anxiety and is currently rating those symptoms highly. Pt will demonstrated decreased symptoms of anxiety and rate it at 3/10 prior to d/c. 10/08/14: Pt denies symptoms of anxiety. Rates anxiety at 0/10.  5.  Goal(s): Patient will demonstrate decreased signs of psychosis  . Met:  Adequate for DC. . Target date: 3-5 days from date of admission  . As evidenced by: Patient will demonstrate decreased frequency of AVH or return to baseline function o 10/03/14: Pt demonstrates paranoid thought processes, reports that "they are always listening" and is reluctant to trust staff o 10/08/14: Pt continues to demonstrate paranoid delusions, reporting that she feels that her family knows who is watching her and that there are cameras in her house.    -10/09/14: Pt continues to experience paranoid delusions, but MD feels that this is adequate for   DC. Pt not a danger to self or others. Pt willing to continue to consider her fears as delusions.     Attendees:  Patient:    Family:    Physician: Dr. Parke Poisson, MD  10/08/2014 10:40 AM   Nursing: Lars Pinks, RN Case manager  10/08/2014 10:40 AM   Clinical Social Worker Norman Clay, MSW 10/08/2014 10:40 AM   Other: Lucinda Dell, Beverly Sessions Liasion 10/08/2014 10:40 AM   Clinical: Grayland Ormond, RN; Darrol Angel, RN  10/08/2014 10:40 AM   Other:    Other:      Peri Maris, Latanya Presser MSW

## 2014-10-08 NOTE — Progress Notes (Signed)
Adult Psychoeducational Group Note  Date:  10/08/2014 Time:  0900   Group Topic/Focus:  Recovery Goals:   The focus of this group is to identify appropriate goals for recovery and establish a plan to achieve them.  Participation Level:  Active  Participation Quality:  Appropriate  Affect:  Appropriate  Cognitive:  Appropriate  Insight: Appropriate  Engagement in Group:  Engaged  Modes of Intervention:  Discussion and Education  Additional Comments:    Melvina Pangelinan L 10/08/2014, 9:31 AM

## 2014-10-09 MED ORDER — TRAZODONE HCL 100 MG PO TABS: 100.0000 mg | ORAL_TABLET | Freq: Every evening | ORAL | Status: DC | PRN

## 2014-10-09 MED ORDER — LAMOTRIGINE 150 MG PO TABS
150.0000 mg | ORAL_TABLET | Freq: Every day | ORAL | Status: DC
Start: 2014-10-09 — End: 2014-11-13

## 2014-10-09 MED ORDER — LAMOTRIGINE 150 MG PO TABS: 150.0000 mg | ORAL_TABLET | Freq: Every day | ORAL | Status: DC

## 2014-10-09 MED ORDER — VENLAFAXINE HCL ER 75 MG PO CP24: 75.0000 mg | ORAL_CAPSULE | Freq: Every day | ORAL | Status: DC

## 2014-10-09 MED ORDER — ARIPIPRAZOLE 20 MG PO TABS: 20.0000 mg | ORAL_TABLET | Freq: Every day | ORAL | Status: DC

## 2014-10-09 NOTE — BHH Group Notes (Signed)
[facility] LCSW Aftercare Discharge Planning Group Note  10/09/2014 8:45 AM  Participation Quality: Alert, Appropriate and Oriented  Mood/Affect: Flat  Depression Rating: 0  Anxiety Rating: "just a little"  Thoughts of Suicide: Pt denies SI/HI  Will you contract for safety? Yes  Current AVH: Pt denies  Plan for Discharge/Comments: Pt attended discharge planning group and actively participated in group. CSW discussed suicide prevention education with the group and encouraged them to discuss discharge planning and any relevant barriers. Pt reports feelings "a little anxious" but expresses no needs at this time.  Transportation Means: Pt reports access to transportation  Supports: No supports mentioned at this time  Peri Maris, [place] 10/09/2014 9:19 AM

## 2014-10-09 NOTE — Progress Notes (Signed)
D. Pt has been up and visible in milieu this evening, did attend and participate in evening group activity. Pt has appeared flat and withdrawn in the unit however does brighten some on approach. Pt has had limited engagement with peers and staff. Pt reports that she is doing ok, however. Pt did receive medications this evening without incident and has not verbalized any complaints of pain. A. Support and encouragement provided. R. Safety maintained, will continue to monitor.

## 2014-10-09 NOTE — BHH Suicide Risk Assessment (Signed)
Donna Black Discharge Suicide Risk Assessment   Demographic Factors:  49 year old separated female, lives alone, currently unemployed   Total Time spent with patient: 30 minutes  Musculoskeletal: Strength & Muscle Tone: within normal limits Gait & Station: normal Patient leans: N/A  Psychiatric Specialty Exam: Physical Exam  ROS at this time denies dizziness   Blood pressure 97/54, pulse 106, temperature 99.1 F (37.3 C), temperature source Oral, resp. rate 17, height 5\' 5"  (1.651 m), weight 134 lb (60.782 kg), last menstrual period 09/23/2014.Body mass index is 22.3 kg/(m^2).  General Appearance: improved grooming   Eye Contact::  Good  Speech:  Normal Rate409  Volume:  Normal  Mood:  Euthymic- denies depression  Affect:  Appropriate and reactive   Thought Process:  Linear  Orientation:  Full (Time, Place, and Person)  Thought Content:  auditory hallucinations - clicking sounds- have improved  but not completely resolved. Paranoid ideations are present, feeling that she is being monitored / photographed. The intensity of these thoughts has decreased and is becoming more amenanble to try to challlenge these ideations  Suicidal Thoughts:  No- denies any suicidal ideations, no self injurious ideations, also denies any homicidal or violent ideations towards anyone .  Homicidal Thoughts:  No  Memory:  recengt and remote grossly intact   Judgement:  Other:  improving  Insight:  improved  ( partially )  Psychomotor Activity:  Normal  Concentration:  Good  Recall:  Good  Fund of Knowledge:Good  Language: Good  Akathisia:  Negative  Handed:  Left  AIMS (if indicated):     Assets:  Communication Skills Desire for Improvement Physical Health Resilience  Sleep:  Number of Hours: 6.5  Cognition: WNL  ADL's:  Improved    Have you used any form of tobacco in the last 30 days? (Cigarettes, Smokeless Tobacco, Cigars, and/or Pipes): Yes  Has this patient used any form of tobacco in the last  30 days? (Cigarettes, Smokeless Tobacco, Cigars, and/or Pipes) Yes, A prescription for an FDA-approved tobacco cessation medication was offered at discharge and the patient refused  Mental Status Per Nursing Assessment::   On Admission:  NA  Current Mental Status by Physician: At this time patient is well groomed, calm, pleasant , polite, good eye contact, no psychomotor agitation or restlessness, mood described as normal and presents euthymic, affect is appropriate and smiles appropriately at times, no thought disorder , denies any SI or any HI ( of note , there is no history of violence) . Auditory hallucinations described as clicking sounds have decreased, she is not internally preoccupied, she continues to have paranoid ideations - see above, but is becoming better able to challenge these ideations.   Loss Factors: Marital separation, sees her children less frequently   Historical Factors: History of Depression, history of suicide attempt by overdosing, history of delusions   Risk Reduction Factors:   resilience, coping skills, housing, sense of responsibility to family  Continued Clinical Symptoms:   decreased but not completely resolved auditory hallucinations, ongoing paranoid ideations ( feels she is being taped, photographed, monitored ) but some improvement insofar as being better able to talk about this with others, rather than hide it, and becoming more amenable to challenge the thoughts . Of note, no SI or HI, and currently not feeling depressed . Recent dizziness , possibly related to Abilify trial, now improving. Currently gait steady.   Cognitive Features That Contribute To Risk:  No gross cognitive deficits noted upon discharge. Is alert ,  attentive, and oriented x 3   Suicide Risk:  Mild:  Suicidal ideation of limited frequency, intensity, duration, and specificity.  There are no identifiable plans, no associated intent, mild dysphoria and related symptoms, good  self-control (both objective and subjective assessment), few other risk factors, and identifiable protective factors, including available and accessible social support.  Principal Problem: MDD (major depressive disorder), recurrent, severe, with psychosis Discharge Diagnoses:  Patient Active Problem List   Diagnosis Date Noted  . MDD (major depressive disorder), recurrent, severe, with psychosis [F33.3] 10/03/2014  . HX: breast cancer [Z85.3] 10/03/2014  . Hx of Graves' disease [Z86.39] 10/03/2014  . Closed bimalleolar fracture of right ankle [S82.841A] 09/18/2013  . Bimalleolar fracture [V01.314H] 09/18/2013  . 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 Tobacco abuse [Z72.0] 03/31/2011  . 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   Why:  at 4:30pm for medication management with Dr. Judeth Cornfield.   Contact information:   [address] [place] [address] [phone]      Follow up with [facility] On 10/11/2014.   Why:  at 4:00pm for therapy.   Contact information:   Street Address: [address], [place]  Mailing Address:  [address] , [place]. [place]      Plan Of Care/Follow-up recommendations:  Activity:  as tolerated  Diet:  Regular  Tests:  NA Other:  See below   Is patient on multiple antipsychotic therapies at discharge:  No   Has Patient had three or more failed trials of antipsychotic monotherapy by history:  No  Recommended Plan for Multiple Antipsychotic Therapies: NA   Patient is leaving in good spirits. She is wanting discharge and at this time there are no grounds for involuntary commitment ( no SI, no HI, good/preserved ADLs)  She plans to return home. Follow up as above . Encouraged to avoid alcohol  We reviewed medication side effects, advised not to  drive if sedated or dizzy.    Robben Jagiello 10/09/2014, 10:09 AM

## 2014-10-09 NOTE — Progress Notes (Signed)
Patient is up and visible on the unit. Rating her depression and hopelessness at a 0/10 with anxiety at a 3-4/10. Mood is pleasant, affect slight blunted though patient brightens on 1:1 interaction. She denies pain or physical problems. Medicated per orders. Support and encouragement offered. Discussed with tx team, MD. Patient will be discharged today. Denies SI/HI and is safe. Donna Black

## 2014-10-09 NOTE — Progress Notes (Signed)
  [facility] Adult Case Management Discharge Plan :  Will you be returning to the same living situation after discharge:  Yes,  Pt returning to her home At discharge, do you have transportation home?: Yes,  Pt daughter to provide transportation Do you have the ability to pay for your medications: Yes,  Pt provided with samples and prescriptions  Release of information consent forms completed and in the chart;  Patient's signature needed at discharge.  Patient to Follow up at: Follow-up Information    Follow up with [facility] On 10/24/2014.   Why:  at 4:30pm for medication management with Dr. Judeth Cornfield.   Contact information:   [address] [place] [address] [phone]      Follow up with [facility] On 10/11/2014.   Why:  at 4:00pm for therapy.   Contact information:   Street Address: [address] [place]  Mailing Address:  [place]   [address] , [place]. [place]      Patient denies SI/HI: Yes,  Pt denies    Land and Suicide Prevention discussed: Yes,  with Pt; declined family contact  Have you used any form of tobacco in the last 30 days? (Cigarettes, Smokeless Tobacco, Cigars, and/or Pipes): Yes  Has patient been referred to the Quitline?: Patient refused referral  Bo Mcclintock 10/09/2014, 9:41 AM

## 2014-10-09 NOTE — Progress Notes (Signed)
Patient packed and verbalizes readiness for discharge. D/C teaching completed, Rx's given along with sample meds. All belongings returned. Patient verbalizes understanding. Denies SI/HI. Patient is discharged in stable condition. Patient driving self home. Jamie Kato

## 2014-10-09 NOTE — Discharge Summary (Signed)
Physician Discharge Summary Note  Patient:  Donna Black is an 49 y.o., female MRN:  [id number] DOB:  1965-12-13 Patient phone:  [phone] (home)  Patient address:   [place] [address],  Total Time spent with patient: 45 minutes  Date of Admission:  10/03/2014 Date of Discharge: 10/09/2014  Reason for Admission:  depression  Principal Problem: MDD (major depressive disorder), recurrent, severe, with psychosis Discharge Diagnoses: Patient Active Problem List   Diagnosis Date Noted  . MDD (major depressive disorder), recurrent, severe, with psychosis [F33.3] 10/03/2014  . HX: breast cancer [Z85.3] 10/03/2014  . Hx of Graves' disease [Z86.39] 10/03/2014  . Closed bimalleolar fracture of right ankle [S82.841A] 09/18/2013  . Bimalleolar fracture [S28.315V] 09/18/2013  . 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 Tobacco abuse [Z72.0] 03/31/2011  . 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   Review of Systems  All other systems reviewed and are negative.   Blood pressure 97/54, pulse 106, temperature 99.1 F (37.3 C), temperature source Oral, resp. rate 17, height 5\' 5"  (1.651 m), weight 60.782 kg (134 lb), last menstrual period 09/23/2014.Body mass index is 22.3 kg/(m^2).   General Appearance: improved grooming   Eye Contact:: Good  Speech: Normal Rate  Volume: Normal  Mood: Euthymic- denies depression  Affect: Appropriate and reactive   Thought Process: Linear  Orientation: Full (Time, Place, and Person)  Thought Content: auditory hallucinations - clicking sounds- have improved but not completely resolved. Paranoid ideations are present, feeling that she is being monitored / photographed. The intensity of these thoughts has decreased and is becoming  more amenanble to try to challlenge these ideations  Suicidal Thoughts: No- denies any suicidal ideations, no self injurious ideations, also denies any homicidal or violent ideations towards anyone .  Homicidal Thoughts: No  Memory: recengt and remote grossly intact   Judgement: Other: improving  Insight: improved  ( partially )  Psychomotor Activity: Normal  Concentration: Good  Recall: Good  Fund of Knowledge:Good  Language: Good  Akathisia: Negative  Handed: Left  AIMS (if indicated):    Assets: Communication Skills Desire for Improvement Physical Health Resilience  Sleep: Number of Hours: 6.5  Cognition: WNL  ADL's: Improved        Have you used any form of tobacco in the last 30 days? (Cigarettes, Smokeless Tobacco, Cigars, and/or Pipes): Yes  Has this patient used any form of tobacco in the last 30 days? (Cigarettes, Smokeless Tobacco, Cigars, and/or Pipes) N/A  Past Medical History:  Past Medical History  Diagnosis Date  . Wears glasses   . Depression   . Heart murmur     told off and on in past that she has had one  . Grave's disease     diagnosed about 7 yrs ago but after a year she has been doing well  . Anemia     when she was younger  . Complication of anesthesia     ?, heart rate dropped w/wisdom teeth  . Cancer 2013    bilateral breast cancer  . Pneumonia   . GERD (gastroesophageal reflux disease)     with pregnancy only  . High cholesterol     Past Surgical History  Procedure Laterality Date  . Cholecystectomy    . Wisdom tooth extraction    . Breast fibroadenoma surgery  1987, 2000    bilateral  . Breast reconstruction  05/12/2011    Procedure: BREAST RECONSTRUCTION;  Surgeon: Crissie Reese, MD;  Location: [facility];  Service: Plastics;  Laterality: Bilateral;  PLACEMENT OF BILATERAL TISSUE EXPANDERS WITH POSSIBLE USE OF HD FLEX  . Cesarean section      2001,cyst; 2002 birth  . Orif ankle fracture Right 09/18/2013     Procedure: OPEN REDUCTION INTERNAL FIXATION (ORIF) RIGHT ANKLE FRACTURE;  Surgeon: Mcarthur Rossetti, MD;  Location: [facility];  Service: Orthopedics;  Laterality: Right;  . Breast reconstruction    . Mastectomy Bilateral    Family History:  Family History  Problem Relation Age of Onset  . Breast cancer Maternal Grandmother   . Cancer Maternal Grandmother     breast  . Heart disease Mother   . Stroke Mother   . COPD Mother   . Diabetes Mother   . Heart disease Father    Social History:  History  Alcohol Use  . 1.2 oz/week  . 2 Glasses of wine per week    Comment: sometimes daily or can just be weekends     History  Drug Use No    Social History   Social History  . Marital Status: Married    Spouse Name: N/A  . Number of Children: N/A  . Years of Education: N/A   Social History Main Topics  . Smoking status: Current Some Day Smoker -- 1.00 packs/day    Types: Cigarettes    Last Attempt to Quit: 10/01/2013  . Smokeless tobacco: Never Used  . Alcohol Use: 1.2 oz/week    2 Glasses of wine per week     Comment: sometimes daily or can just be weekends  . Drug Use: No  . Sexual Activity: Yes    Birth Control/ Protection: Condom   Other Topics Concern  . None   Social History Narrative   Risk to Self: Is patient at risk for suicide?: No What has been your use of drugs/alcohol within the last 12 months?: denies use Risk to Others:   Prior Inpatient Therapy:   Prior Outpatient Therapy:    Level of Care:  OP  Hospital Course:  Amabel Stmarie was admitted for MDD (major depressive disorder), recurrent, severe, with psychosis and crisis management.  She was treated discharged with the medications listed below under Medication List.  Medical problems were identified and treated as needed.  Home medications were restarted as appropriate.  Improvement was monitored by observation and Teryl Lucy daily report of symptom reduction.  Emotional and mental status  was monitored by daily self-inventory reports completed by Teryl Lucy and clinical staff.         Teryl Lucy was evaluated by the treatment team for stability and plans for continued recovery upon discharge.  Teryl Lucy motivation was an integral factor for scheduling further treatment.  Employment, transportation, bed availability, health status, family support, and any pending legal issues were also considered during her hospital stay.  She was offered further treatment options upon discharge including but not limited to Residential, Intensive Outpatient, and Outpatient treatment.  Teryl Lucy will follow up with the services as listed below under Follow Up Information.     Upon completion of this admission the patient was both mentally and medically stable for discharge denying suicidal/homicidal ideation, auditory/visual/tactile hallucinations, delusional thoughts and paranoia.      Consults:  psychiatry  Significant Diagnostic Studies:  labs: per lab  Discharge Vitals:   Blood  pressure 97/54, pulse 106, temperature 99.1 F (37.3 C), temperature source Oral, resp. rate 17, height 5\' 5"  (1.651 m), weight 60.782 kg (134 lb), last menstrual period 09/23/2014. Body mass index is 22.3 kg/(m^2). Lab Results:   No results found for this or any previous visit (from the past 72 hour(s)).  Physical Findings: AIMS: Facial and Oral Movements Muscles of Facial Expression: None, normal Lips and Perioral Area: None, normal Jaw: None, normal Tongue: None, normal,Extremity Movements Upper (arms, wrists, hands, fingers): None, normal Lower (legs, knees, ankles, toes): None, normal, Trunk Movements Neck, shoulders, hips: None, normal, Overall Severity Severity of abnormal movements (highest score from questions above): None, normal Incapacitation due to abnormal movements: None, normal Patient's awareness of abnormal movements (rate only patient's report): No Awareness,  Dental Status Current problems with teeth and/or dentures?: No Does patient usually wear dentures?: No  CIWA:  CIWA-Ar Total: 0 COWS:  COWS Total Score: 1   See Psychiatric Specialty Exam and Suicide Risk Assessment completed by Attending Physician prior to discharge.  Discharge destination:  Home  Is patient on multiple antipsychotic therapies at discharge:  No   Has Patient had three or more failed trials of antipsychotic monotherapy by history:  No    Recommended Plan for Multiple Antipsychotic Therapies: NA     Medication List    STOP taking these medications        aspirin EC 325 MG tablet     buPROPion 150 MG 24 hr tablet  Commonly known as:  WELLBUTRIN XL     clonazePAM 0.5 MG tablet  Commonly known as:  KLONOPIN     terbinafine 250 MG tablet  Commonly known as:  LAMISIL      TAKE these medications      Indication   ARIPiprazole 20 MG tablet  Commonly known as:  ABILIFY  Take 1 tablet (20 mg total) by mouth at bedtime.   Indication:  MOOD STABILIZATION     lamoTRIgine 150 MG tablet  Commonly known as:  LAMICTAL  Take 1 tablet (150 mg total) by mouth daily.   Indication:  mood stabilization     traZODone 100 MG tablet  Commonly known as:  DESYREL  Take 1 tablet (100 mg total) by mouth at bedtime as needed for sleep.   Indication:  Trouble Sleeping     venlafaxine XR 75 MG 24 hr capsule  Commonly known as:  EFFEXOR-XR  Take 1 capsule (75 mg total) by mouth daily with breakfast.   Indication:  Major Depressive Disorder       Follow-up Information    Follow up with [facility] On 10/24/2014.   Why:  at 4:30pm for medication management with Dr. Judeth Cornfield.   Contact information:   [address] [place] [address] [phone]      Follow up with [facility] On 10/11/2014.   Why:  at 4:00pm for therapy.   Contact information:   Street Address: [address], [place]  Mailing Address:  [address] , [place]. [place]      Follow-up recommendations:  Activity:  as tol, diet as tol  Comments:  1.  Take all your medications as prescribed.              2.  Report any adverse side effects to outpatient provider.  3.  Patient instructed to not use alcohol or illegal drugs while on prescription medicines.            4.  In the event of worsening symptoms, instructed patient to call 911, the crisis hotline or go to nearest emergency room for evaluation of symptoms.  Total Discharge Time:  45 min  Signed: Freda Munro May Agustin AGNP-BC 10/09/2014, 5:20 PM   Patient seen, Suicide Assessment Completed.  Disposition Plan Reviewed

## 2014-10-24 ENCOUNTER — Ambulatory Visit (HOSPITAL_COMMUNITY): Payer: Self-pay | Admitting: Psychiatry

## 2014-11-13 ENCOUNTER — Encounter (HOSPITAL_COMMUNITY): Payer: Self-pay | Admitting: Psychiatry

## 2014-11-13 ENCOUNTER — Ambulatory Visit (INDEPENDENT_AMBULATORY_CARE_PROVIDER_SITE_OTHER): Payer: BLUE CROSS/BLUE SHIELD | Admitting: Psychiatry

## 2014-11-13 VITALS — BP 114/76 | HR 77 | Ht 65.0 in | Wt 140.2 lb

## 2014-11-13 DIAGNOSIS — F331 Major depressive disorder, recurrent, moderate: Secondary | ICD-10-CM | POA: Diagnosis not present

## 2014-11-13 MED ORDER — VENLAFAXINE HCL ER 75 MG PO CP24: 75.0000 mg | ORAL_CAPSULE | Freq: Every day | ORAL | Status: DC

## 2014-11-13 MED ORDER — LAMOTRIGINE 150 MG PO TABS: 150.0000 mg | ORAL_TABLET | Freq: Every day | ORAL | Status: DC

## 2014-11-13 MED ORDER — TRAZODONE HCL 100 MG PO TABS: 100.0000 mg | ORAL_TABLET | Freq: Every evening | ORAL | Status: DC | PRN

## 2014-11-13 MED ORDER — ARIPIPRAZOLE 20 MG PO TABS: 20.0000 mg | ORAL_TABLET | Freq: Every day | ORAL | Status: DC

## 2014-11-13 NOTE — Progress Notes (Signed)
[facility] Progress Note  Donna Black [id number] 49 y.o.  11/13/2014 1:51 PM  Chief Complaint:  I was admitted to behavioral [facility].  I'm feeling better.  I'm taking Effexor.       History of Present Illness: Donna Black came for her follow-up appointment.  Patient was admitted to behavioral [facility] in August.  She was recommended by this Probation officer as patient walking on August 10 complaining of increase depression having paranoia and acting strange.  Her daughter had call us earlier that she is not doing very well.  Patient was admitted at behavioral [name] and her medicines were adjusted.  Her Abilify is reduced and Effexor was started.  She is no longer taking Klonopin and Wellbutrin.  She feel her medicine is working very well.  She also change her habits and had a very positive approach towards her life.  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  She is very happy about it.  She reported a good relationship with her daughters who lives in [place].  She also seeing her son Donna Black Tuesday Thursday and every other weekend.  She also invited from [place]  (husband) mother.  Patient does not talk about her husband anymore.  She has her own place and she is very happy about it.  Her energy level is good.  She sleeping good.  She started seeing [facility] for counseling.  She denies any irritability, anger, mood swing.  She denies any crying spells or any feeling of hopelessness or worthlessness.  She also mentioned recently she started talking to her brother and her father who has not done in a while.  Patient denies drinking or using any illegal substances.  Her appetite is okay.  She has no paranoia or any hallucination.  She has no tremors, shakes, EPS.  She denies any itching .    Suicidal Ideation: No Plan Formed: No Patient has means to carry out plan: No  Homicidal Ideation: No Plan Formed: No Patient has means to carry out  plan: No  Review of Systems  Constitutional: Negative.   HENT: Negative.   Respiratory: Negative.   Cardiovascular: Negative for chest pain and palpitations.  Gastrointestinal: Negative.   Musculoskeletal: Negative.   Skin: Negative for itching and rash.  Neurological: Negative for dizziness and tremors.  Psychiatric/Behavioral: Negative.    Psychiatric: Agitation: No Hallucination: No Depressed Mood: No Insomnia: No Hypersomnia: No Altered Concentration: No Feels Worthless: No Grandiose Ideas: No Belief In Special Powers: No New/Increased Substance Abuse: No Compulsions: No  Neurologic: Headache: No Seizure: No Paresthesias: No  Past Medical History  Diagnosis Date  . Wears glasses   . Depression   . Heart murmur     told off and on in past that she has had one  . Grave's disease     diagnosed about 7 yrs ago but after a year she has been doing well  . Anemia     when she was younger  . Complication of anesthesia     ?, heart rate dropped w/wisdom teeth  . Cancer 2013    bilateral breast cancer  . Pneumonia   . GERD (gastroesophageal reflux disease)     with pregnancy only  . High cholesterol    She see [facility] Alessandra Grout at [facility].   Social History:  Patient lives with her husband.  Outpatient Encounter Prescriptions as of 11/13/2014  Medication Sig  . ARIPiprazole (ABILIFY) 20 MG tablet  Take 1 tablet (20 mg total) by mouth at bedtime.  . lamoTRIgine (LAMICTAL) 150 MG tablet Take 1 tablet (150 mg total) by mouth daily.  . traZODone (DESYREL) 100 MG tablet Take 1 tablet (100 mg total) by mouth at bedtime as needed for sleep.  Marland Kitchen venlafaxine XR (EFFEXOR-XR) 75 MG 24 hr capsule Take 1 capsule (75 mg total) by mouth daily with breakfast.  . [DISCONTINUED] ARIPiprazole (ABILIFY) 20 MG tablet Take 1 tablet (20 mg total) by mouth at bedtime.  . [DISCONTINUED] lamoTRIgine (LAMICTAL) 150 MG tablet Take 1 tablet (150 mg total) by mouth daily.  .  [DISCONTINUED] traZODone (DESYREL) 100 MG tablet Take 1 tablet (100 mg total) by mouth at bedtime as needed for sleep.  . [DISCONTINUED] venlafaxine XR (EFFEXOR-XR) 75 MG 24 hr capsule Take 1 capsule (75 mg total) by mouth daily with breakfast.   No facility-administered encounter medications on file as of 11/13/2014.   Recent Results (from the past 2160 hour(s))  Cytology - PAP     Status: None   Collection Time: 09/05/14 12:00 AM  Result Value Ref Range   CYTOLOGY - PAP PAP RESULT   CBC WITH DIFFERENTIAL     Status: Abnormal   Collection Time: 10/02/14  8:33 PM  Result Value Ref Range   WBC 8.6 4.0 - 10.5 K/uL   RBC 4.57 3.87 - 5.11 MIL/uL   Hemoglobin 15.2 (H) 12.0 - 15.0 g/dL   HCT 44.0 36.0 - 46.0 %   MCV 96.3 78.0 - 100.0 fL   MCH 33.3 26.0 - 34.0 pg   MCHC 34.5 30.0 - 36.0 g/dL   RDW 12.8 11.5 - 15.5 %   Platelets 288 150 - 400 K/uL   Neutrophils Relative % 59 43 - 77 %   Neutro Abs 5.1 1.7 - 7.7 K/uL   Lymphocytes Relative 25 12 - 46 %   Lymphs Abs 2.1 0.7 - 4.0 K/uL   Monocytes Relative 9 3 - 12 %   Monocytes Absolute 0.8 0.1 - 1.0 K/uL   Eosinophils Relative 7 (H) 0 - 5 %   Eosinophils Absolute 0.6 0.0 - 0.7 K/uL   Basophils Relative 0 0 - 1 %   Basophils Absolute 0.0 0.0 - 0.1 K/uL  Comprehensive metabolic panel     Status: Abnormal   Collection Time: 10/02/14  8:33 PM  Result Value Ref Range   Sodium 138 135 - 145 mmol/L   Potassium 3.8 3.5 - 5.1 mmol/L   Chloride 103 101 - 111 mmol/L   CO2 26 22 - 32 mmol/L   Glucose, Bld 143 (H) 65 - 99 mg/dL   BUN 12 6 - 20 mg/dL   Creatinine, Ser 0.74 0.44 - 1.00 mg/dL   Calcium 9.7 8.9 - 10.3 mg/dL   Total Protein 8.0 6.5 - 8.1 g/dL   Albumin 4.5 3.5 - 5.0 g/dL   AST 27 15 - 41 U/L   ALT 21 14 - 54 U/L   Alkaline Phosphatase 41 38 - 126 U/L   Total Bilirubin 0.3 0.3 - 1.2 mg/dL   GFR calc non Af Amer >60 >60 mL/min   GFR calc Af Amer >60 >60 mL/min    Comment: (NOTE) The eGFR has been calculated using the CKD EPI  equation. This calculation has not been validated in all clinical situations. eGFR's persistently <60 mL/min signify possible Chronic Kidney Disease.    Anion gap 9 5 - 15  Ethanol     Status: None  Collection Time: 10/02/14  8:33 PM  Result Value Ref Range   Alcohol, Ethyl (B) <5 <5 mg/dL    Comment:        LOWEST DETECTABLE LIMIT FOR SERUM ALCOHOL IS 5 mg/dL FOR MEDICAL PURPOSES ONLY   Urine rapid drug screen (hosp performed)not at [facility]     Status: None   Collection Time: 10/02/14  9:23 PM  Result Value Ref Range   Opiates NONE DETECTED NONE DETECTED   Cocaine NONE DETECTED NONE DETECTED   Benzodiazepines NONE DETECTED NONE DETECTED   Amphetamines NONE DETECTED NONE DETECTED   Tetrahydrocannabinol NONE DETECTED NONE DETECTED   Barbiturates NONE DETECTED NONE DETECTED    Comment:        DRUG SCREEN FOR MEDICAL PURPOSES ONLY.  IF CONFIRMATION IS NEEDED FOR ANY PURPOSE, NOTIFY LAB WITHIN 5 DAYS.        LOWEST DETECTABLE LIMITS FOR URINE DRUG SCREEN Drug Class       Cutoff (ng/mL) Amphetamine      1000 Barbiturate      200 Benzodiazepine   333 Tricyclics       545 Opiates          300 Cocaine          300 THC              50   Pregnancy, urine     Status: None   Collection Time: 10/03/14  3:29 PM  Result Value Ref Range   Preg Test, Ur NEGATIVE NEGATIVE    Comment:        THE SENSITIVITY OF THIS METHODOLOGY IS >20 mIU/mL. Performed at [facility]   TSH     Status: None   Collection Time: 10/04/14  6:16 AM  Result Value Ref Range   TSH 2.232 0.350 - 4.500 uIU/mL    Comment: Performed at [facility]  Lipid panel     Status: Abnormal   Collection Time: 10/04/14  6:16 AM  Result Value Ref Range   Cholesterol 232 (H) 0 - 200 mg/dL   Triglycerides 94 <150 mg/dL   HDL 45 >40 mg/dL   Total CHOL/HDL Ratio 5.2 RATIO   VLDL 19 0 - 40 mg/dL   LDL Cholesterol 168 (H) 0 - 99 mg/dL    Comment:        Total Cholesterol/HDL:CHD  Risk Coronary Heart Disease Risk Table                     Men   Women  1/2 Average Risk   3.4   3.3  Average Risk       5.0   4.4  2 X Average Risk   9.6   7.1  3 X Average Risk  23.4   11.0        Use the calculated Patient Ratio above and the CHD Risk Table to determine the patient's CHD Risk.        ATP III CLASSIFICATION (LDL):  <100     mg/dL   Optimal  100-129  mg/dL   Near or Above                    Optimal  130-159  mg/dL   Borderline  160-189  mg/dL   High  >190     mg/dL   Very High Performed at [facility]   Hemoglobin A1c     Status: None   Collection Time:  10/04/14  6:16 AM  Result Value Ref Range   Hgb A1c MFr Bld 5.3 4.8 - 5.6 %    Comment: (NOTE)         Pre-diabetes: 5.7 - 6.4         Diabetes: >6.4         Glycemic control for adults with diabetes: <7.0    Mean Plasma Glucose 105 mg/dL    Comment: (NOTE) Performed At: [facility] [place], [place] [id number] Lindon Romp MD AS:[id number] Performed at [facility]      Past Psychiatric History/Hospitalization(s): Patient was recently admitted to behavioral [facility] in August 2016 due to severe depression and having paranoia .  Patient is seeing in this office since 2009 status post inpatient psychiatric treatment and behavioral [facility] and she completed intensive outpatient program.  At that time she was admitted due to suicidal attempt by taking overdose on her medication.  In the past she has taken Prozac, Klonopin, Wellbutrin.  She has history of paranoia and delusions. Anxiety: Yes Bipolar Disorder: No Depression: Yes Mania: No Psychosis: Yes Schizophrenia: No Personality Disorder: No Hospitalization for psychiatric illness: Yes History of Electroconvulsive Shock Therapy: No Prior Suicide Attempts: Yes  Physical Exam: Constitutional:  BP 114/76 mmHg  Pulse 77  Ht $R'5\' 5"'qO$  (1.651 m)  Wt 140 lb 3.2 oz (63.594 kg)  BMI 23.33  kg/m2  General Appearance: well nourished.    Musculoskeletal: Strength & Muscle Tone: within normal limits Gait & Station: normal Patient leans: Patient has a normal posture  Mental Status Examination: Patient is casually dressed and fairly groomed.  She is pleasant and cooperative.  She maintained good eye contact.  She described her mood good and her affect is mood appropriate.  She denies any auditory or visual hallucination.  She denies any paranoia , delusion or any obsessive thoughts.  She denies any active or passive suicidal parts or homicidal thought.  She has no tremors, shakes or any EPS.  Her fund of knowledge is adequate.  She is alert and oriented x3.  Her psychomotor activity is normal.  Her insight judgment and impulse control is okay.  Established Problem, Stable/Improving (1), Review of Psycho-Social Stressors (1), Review or order clinical lab tests (1), Decision to obtain old records (1), Review and summation of old records (2), Review of Last Therapy Session (1), Review of Medication Regimen & Side Effects (2) and Review of New Medication or Change in Dosage (2)  Assessment: Axis I: Maj. 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  Patient is doing much better on her current medication.  She is not using trazodone every night.  Recommended to continue Effexor XR 75 mg in the morning, Lamictal 150 mg daily .  Her Abilify dose reduced from 30 mg to 20 mg.  We'll continue that.  She is using trazodone as needed.  Discussed medication side effects and benefits.  Discussed Lamictal can cause rash and itching and in that case she needed to call us immediately.  Encourage to keep appointment with [facility] for counseling.  I will see her again in 2 months.  Discuss safety plan that anytime having active suicidal  thoughts or homicidal thoughts and she need to call 911 or go to a local emergency room.   Sedrick Tober T., MD  11/13/2014                             

## 2014-11-15 ENCOUNTER — Telehealth (HOSPITAL_COMMUNITY): Payer: Self-pay

## 2014-11-15 NOTE — Telephone Encounter (Signed)
Telephone call with [facility] to verify they had received Dr. Marguerite Olea e-scribed orders from 11/13/14.  Pharmacist verified they had received 4 new orders on 11/13/14 and all were prepared for patient to pick up.  Called patient to inform orders from Dr. Adele Schilder from 11/13/14 were received at [facility] and already filled and ready for patient to pick up.  Patient stated plans to pick them up today and will call back if any concerns.

## 2014-11-15 NOTE — Telephone Encounter (Signed)
Telephone message left for patient after she left one stating concern [facility] was telling her they did not have her medication orders.  Left message to inform all new orders were e-scribed to [facility] on 11/13/14 by Dr. Adele Schilder and confirmed received that date at 1:48am.  Requested patient call us back if they do not have these orders.

## 2014-12-25 ENCOUNTER — Ambulatory Visit (INDEPENDENT_AMBULATORY_CARE_PROVIDER_SITE_OTHER): Payer: BLUE CROSS/BLUE SHIELD | Admitting: Psychiatry

## 2014-12-25 ENCOUNTER — Encounter (HOSPITAL_COMMUNITY): Payer: Self-pay | Admitting: Psychiatry

## 2014-12-25 VITALS — BP 118/78 | HR 78 | Ht 65.0 in | Wt 153.4 lb

## 2014-12-25 DIAGNOSIS — F331 Major depressive disorder, recurrent, moderate: Secondary | ICD-10-CM

## 2014-12-25 DIAGNOSIS — F1021 Alcohol dependence, in remission: Secondary | ICD-10-CM | POA: Diagnosis not present

## 2014-12-25 MED ORDER — LAMOTRIGINE 150 MG PO TABS: 150.0000 mg | ORAL_TABLET | Freq: Every day | ORAL | Status: DC

## 2014-12-25 MED ORDER — ARIPIPRAZOLE 20 MG PO TABS: 20.0000 mg | ORAL_TABLET | Freq: Every day | ORAL | Status: DC

## 2014-12-25 MED ORDER — VENLAFAXINE HCL ER 75 MG PO CP24: 75.0000 mg | ORAL_CAPSULE | Freq: Every day | ORAL | Status: DC

## 2014-12-25 NOTE — Progress Notes (Signed)
[place] Progress Note  Donna Black [id number] 49 y.o.  12/25/2014 11:20 AM  Chief Complaint:  Medication management and follow-up.         History of Present Illness: Donna Black came for her follow-up appointment.  She is compliant with her medication and doing much better.  She feels proud that she has been not drinking in a while and seeing Hulen Luster for counseling.  Lately she is very busy taking care of her father who has been sick.  However she is also spending a lot of time with her son [name] and taking him to ball agmes.  She sleeping better.  She admitted not able to keep up her exercise and she has gained weight.  She denies any irritability, anger, mood swing.  She denies any crying spells or any feeling of hopelessness or worthlessness.  She takes trazodone when she cannot sleep and she took a few times in past few weeks.  Overall she described her mood is much better.  She is planning to spend Thanksgiving with her father brother and other family member.  She is no longer taking Klonopin.  She admitted that she had a positive approach towards her life and she has seen a huge improvement in her mood and outcome.  She denies any feeling of hopelessness or worthlessness.  She wants to continue her current medication and counseling.  She is not using any illegal substances.  Patient lives on her own and very happy about it.  Patient has very limited contact with her husband.  She is excited about upcoming weekend as she is going to spend with her son [name].  Patient has no shakes or any tremors.  She was to continue her current psychiatric medication.  She denies any hallucination, paranoia or any aggressive behavior.  Suicidal Ideation: No Plan Formed: No Patient has means to carry out plan: No  Homicidal Ideation: No Plan Formed: No Patient has means to carry out plan: No  Review of Systems  Constitutional: Negative.   HENT: Negative.   Respiratory:  Negative.   Cardiovascular: Negative for chest pain and palpitations.  Gastrointestinal: Negative.   Musculoskeletal: Negative.   Skin: Negative for itching and rash.  Neurological: Negative for dizziness and tremors.  Psychiatric/Behavioral: Negative.    Psychiatric: Agitation: No Hallucination: No Depressed Mood: No Insomnia: No Hypersomnia: No Altered Concentration: No Feels Worthless: No Grandiose Ideas: No Belief In Special Powers: No New/Increased Substance Abuse: No Compulsions: No  Neurologic: Headache: No Seizure: No Paresthesias: No  Past Medical History  Diagnosis Date  . Wears glasses   . Depression   . Heart murmur     told off and on in past that she has had one  . Grave's disease     diagnosed about 7 yrs ago but after a year she has been doing well  . Anemia     when she was younger  . Complication of anesthesia     ?, heart rate dropped w/wisdom teeth  . Cancer [facility]) 2013    bilateral breast cancer  . Pneumonia   . GERD (gastroesophageal reflux disease)     with pregnancy only  . High cholesterol    She see Dr Alessandra Grout at [facility].   Social History:  Patient lives with her husband.  Outpatient Encounter Prescriptions as of 12/25/2014  Medication Sig  . ARIPiprazole (ABILIFY) 20 MG tablet Take 1 tablet (20 mg total) by mouth at bedtime.  . lamoTRIgine (  LAMICTAL) 150 MG tablet Take 1 tablet (150 mg total) by mouth daily.  . traZODone (DESYREL) 100 MG tablet Take 1 tablet (100 mg total) by mouth at bedtime as needed for sleep.  Marland Kitchen venlafaxine XR (EFFEXOR-XR) 75 MG 24 hr capsule Take 1 capsule (75 mg total) by mouth daily with breakfast.  . [DISCONTINUED] ARIPiprazole (ABILIFY) 20 MG tablet Take 1 tablet (20 mg total) by mouth at bedtime.  . [DISCONTINUED] lamoTRIgine (LAMICTAL) 150 MG tablet Take 1 tablet (150 mg total) by mouth daily.  . [DISCONTINUED] venlafaxine XR (EFFEXOR-XR) 75 MG 24 hr capsule Take 1 capsule (75 mg total) by  mouth daily with breakfast.   No facility-administered encounter medications on file as of 12/25/2014.   Recent Results (from the past 2160 hour(s))  CBC WITH DIFFERENTIAL     Status: Abnormal   Collection Time: 10/02/14  8:33 PM  Result Value Ref Range   WBC 8.6 4.0 - 10.5 K/uL   RBC 4.57 3.87 - 5.11 MIL/uL   Hemoglobin 15.2 (H) 12.0 - 15.0 g/dL   HCT 44.0 36.0 - 46.0 %   MCV 96.3 78.0 - 100.0 fL   MCH 33.3 26.0 - 34.0 pg   MCHC 34.5 30.0 - 36.0 g/dL   RDW 12.8 11.5 - 15.5 %   Platelets 288 150 - 400 K/uL   Neutrophils Relative % 59 43 - 77 %   Neutro Abs 5.1 1.7 - 7.7 K/uL   Lymphocytes Relative 25 12 - 46 %   Lymphs Abs 2.1 0.7 - 4.0 K/uL   Monocytes Relative 9 3 - 12 %   Monocytes Absolute 0.8 0.1 - 1.0 K/uL   Eosinophils Relative 7 (H) 0 - 5 %   Eosinophils Absolute 0.6 0.0 - 0.7 K/uL   Basophils Relative 0 0 - 1 %   Basophils Absolute 0.0 0.0 - 0.1 K/uL  Comprehensive metabolic panel     Status: Abnormal   Collection Time: 10/02/14  8:33 PM  Result Value Ref Range   Sodium 138 135 - 145 mmol/L   Potassium 3.8 3.5 - 5.1 mmol/L   Chloride 103 101 - 111 mmol/L   CO2 26 22 - 32 mmol/L   Glucose, Bld 143 (H) 65 - 99 mg/dL   BUN 12 6 - 20 mg/dL   Creatinine, Ser 0.74 0.44 - 1.00 mg/dL   Calcium 9.7 8.9 - 10.3 mg/dL   Total Protein 8.0 6.5 - 8.1 g/dL   Albumin 4.5 3.5 - 5.0 g/dL   AST 27 15 - 41 U/L   ALT 21 14 - 54 U/L   Alkaline Phosphatase 41 38 - 126 U/L   Total Bilirubin 0.3 0.3 - 1.2 mg/dL   GFR calc non Af Amer >60 >60 mL/min   GFR calc Af Amer >60 >60 mL/min    Comment: (NOTE) The eGFR has been calculated using the CKD EPI equation. This calculation has not been validated in all clinical situations. eGFR's persistently <60 mL/min signify possible Chronic Kidney Disease.    Anion gap 9 5 - 15  Ethanol     Status: None   Collection Time: 10/02/14  8:33 PM  Result Value Ref Range   Alcohol, Ethyl (B) <5 <5 mg/dL    Comment:        LOWEST DETECTABLE LIMIT  FOR SERUM ALCOHOL IS 5 mg/dL FOR MEDICAL PURPOSES ONLY   Urine rapid drug screen (hosp performed)not at [facility]     Status: None   Collection Time: 10/02/14  9:23 PM  Result Value Ref Range   Opiates NONE DETECTED NONE DETECTED   Cocaine NONE DETECTED NONE DETECTED   Benzodiazepines NONE DETECTED NONE DETECTED   Amphetamines NONE DETECTED NONE DETECTED   Tetrahydrocannabinol NONE DETECTED NONE DETECTED   Barbiturates NONE DETECTED NONE DETECTED    Comment:        DRUG SCREEN FOR MEDICAL PURPOSES ONLY.  IF CONFIRMATION IS NEEDED FOR ANY PURPOSE, NOTIFY LAB WITHIN 5 DAYS.        LOWEST DETECTABLE LIMITS FOR URINE DRUG SCREEN Drug Class       Cutoff (ng/mL) Amphetamine      1000 Barbiturate      200 Benzodiazepine   532 Tricyclics       992 Opiates          300 Cocaine          300 THC              50   Pregnancy, urine     Status: None   Collection Time: 10/03/14  3:29 PM  Result Value Ref Range   Preg Test, Ur NEGATIVE NEGATIVE    Comment:        THE SENSITIVITY OF THIS METHODOLOGY IS >20 mIU/mL. Performed at [facility]   TSH     Status: None   Collection Time: 10/04/14  6:16 AM  Result Value Ref Range   TSH 2.232 0.350 - 4.500 uIU/mL    Comment: Performed at [facility]  Lipid panel     Status: Abnormal   Collection Time: 10/04/14  6:16 AM  Result Value Ref Range   Cholesterol 232 (H) 0 - 200 mg/dL   Triglycerides 94 <150 mg/dL   HDL 45 >40 mg/dL   Total CHOL/HDL Ratio 5.2 RATIO   VLDL 19 0 - 40 mg/dL   LDL Cholesterol 168 (H) 0 - 99 mg/dL    Comment:        Total Cholesterol/HDL:CHD Risk Coronary Heart Disease Risk Table                     Men   Women  1/2 Average Risk   3.4   3.3  Average Risk       5.0   4.4  2 X Average Risk   9.6   7.1  3 X Average Risk  23.4   11.0        Use the calculated Patient Ratio above and the CHD Risk Table to determine the patient's CHD Risk.        ATP III CLASSIFICATION (LDL):   <100     mg/dL   Optimal  100-129  mg/dL   Near or Above                    Optimal  130-159  mg/dL   Borderline  160-189  mg/dL   High  >190     mg/dL   Very High Performed at [facility]   Hemoglobin A1c     Status: None   Collection Time: 10/04/14  6:16 AM  Result Value Ref Range   Hgb A1c MFr Bld 5.3 4.8 - 5.6 %    Comment: (NOTE)         Pre-diabetes: 5.7 - 6.4         Diabetes: >6.4         Glycemic control for adults with diabetes: <7.0  Mean Plasma Glucose 105 mg/dL    Comment: (NOTE) Performed At: [facility] [place], [place] [id number] Lindon Romp MD [id number] Performed at [facility]      Past Psychiatric History/Hospitalization(s): Patient was recently admitted to behavioral [place] in August 2016 due to severe depression and having paranoia .  Patient is seeing in this office since 2009 status post inpatient psychiatric treatment and behavioral [place] and she completed intensive outpatient program.  At that time she was admitted due to suicidal attempt by taking overdose on her medication.  In the past she has taken Prozac, Klonopin, Wellbutrin.  She has history of paranoia and delusions. Anxiety: Yes Bipolar Disorder: No Depression: Yes Mania: No Psychosis: Yes Schizophrenia: No Personality Disorder: No Hospitalization for psychiatric illness: Yes History of Electroconvulsive Shock Therapy: No Prior Suicide Attempts: Yes  Physical Exam: Constitutional:  BP 118/78 mmHg  Pulse 78  Ht _0  (1.651 m)  Wt 153 lb 6.4 oz (69.582 kg)  BMI 25.53 kg/m2  General Appearance: well nourished.    Musculoskeletal: Strength & Muscle Tone: within normal limits Gait & Station: normal Patient leans: Patient has a normal posture  Mental Status Examination: Patient is casually dressed and fairly groomed.  She is pleasant and cooperative.  She maintained good eye contact.  She described her mood  good and her affect is mood appropriate.  She denies any auditory or visual hallucination.  She denies any paranoia , delusion or any obsessive thoughts.  She denies any active or passive suicidal parts or homicidal thought.  She has no tremors, shakes or any EPS.  Her fund of knowledge is adequate.  She is alert and oriented x3.  Her psychomotor activity is normal.  Her insight judgment and impulse control is okay.  Established Problem, Stable/Improving (1), Review of Psycho-Social Stressors (1), Review of Last Therapy Session (1) and Review of Medication Regimen & Side Effects (2)  Assessment: Axis I: Maj. 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  She is no longer taking Klonopin and taking trazodone only as needed.  Continue Lamictal 150 mg daily, Effexor XR 75 mg in the morning and Abilify 20 mg daily.  She has no tremors or shakes.  Encouraged to keep appointment with Hulen Luster for counseling.  Discussed medication side effects and benefits.  Recommended to call us back if she has any question or any concern.  Follow-up in 2 months.   Virgil Slinger T., MD 12/25/2014

## 2014-12-31 ENCOUNTER — Telehealth (HOSPITAL_COMMUNITY): Payer: Self-pay

## 2015-02-27 ENCOUNTER — Encounter (HOSPITAL_COMMUNITY): Payer: Self-pay | Admitting: Psychiatry

## 2015-02-27 ENCOUNTER — Ambulatory Visit (INDEPENDENT_AMBULATORY_CARE_PROVIDER_SITE_OTHER): Payer: BLUE CROSS/BLUE SHIELD | Admitting: Psychiatry

## 2015-02-27 VITALS — BP 126/72 | HR 89 | Ht 65.0 in | Wt 165.6 lb

## 2015-02-27 DIAGNOSIS — F1021 Alcohol dependence, in remission: Secondary | ICD-10-CM

## 2015-02-27 DIAGNOSIS — F331 Major depressive disorder, recurrent, moderate: Secondary | ICD-10-CM | POA: Diagnosis not present

## 2015-02-27 MED ORDER — LAMOTRIGINE 150 MG PO TABS: 150.0000 mg | ORAL_TABLET | Freq: Every day | ORAL | Status: DC

## 2015-02-27 MED ORDER — ARIPIPRAZOLE 20 MG PO TABS: 20.0000 mg | ORAL_TABLET | Freq: Every day | ORAL | Status: DC

## 2015-02-27 MED ORDER — VENLAFAXINE HCL ER 75 MG PO CP24: 75.0000 mg | ORAL_CAPSULE | Freq: Every day | ORAL | Status: DC

## 2015-02-27 NOTE — Progress Notes (Signed)
[place] Progress Note  Donna Black [id number] y.o.  02/27/2015 10:23 AM  Chief Complaint:  Medication management and follow-up.         History of Present Illness: Donna Black came for her follow-up appointment.  She had a good Christmas.  She was able to see her children and had a good time.  She continues to involve in taking care of her father who has been sick .  She is taking her medication and he denies any side effects.  She described her depression is stable.  She denies any irritability, anger, crying spells or any feeling of hopelessness or worthlessness.  She has no tremors, shakes or any EPS.  She admitted weight gain but believed due to lack of exercise in not able to keep her diet under control.  She promises that she will restart exercise .  She is excited as she is going to start [employer] and planning to take classes for human resources.  She was to keep herself busy.  She is also seeing Hulen Luster for counseling.  She denies any paranoia or any hallucination.  She lives on her own and she is very happy about it.  She has a limited contact with her husband.  Patient denies drinking or using any illegal substances.  Suicidal Ideation: No Plan Formed: No Patient has means to carry out plan: No  Homicidal Ideation: No Plan Formed: No Patient has means to carry out plan: No  Review of Systems  Constitutional: Negative.   HENT: Negative.   Respiratory: Negative.   Cardiovascular: Negative for chest pain and palpitations.  Gastrointestinal: Negative.   Musculoskeletal: Negative.   Skin: Negative for itching and rash.  Neurological: Negative for dizziness and tremors.  Psychiatric/Behavioral: Negative.    Psychiatric: Agitation: No Hallucination: No Depressed Mood: No Insomnia: No Hypersomnia: No Altered Concentration: No Feels Worthless: No Grandiose Ideas: No Belief In Special Powers: No New/Increased Substance Abuse:  No Compulsions: No  Neurologic: Headache: No Seizure: No Paresthesias: No  Past Medical History  Diagnosis Date  . Wears glasses   . Depression   . Heart murmur     told off and on in past that she has had one  . Grave's disease     diagnosed about 7 yrs ago but after a year she has been doing well  . Anemia     when she was younger  . Complication of anesthesia     ?, heart rate dropped w/wisdom teeth  . Cancer [facility]) 2013    bilateral breast cancer  . Pneumonia   . GERD (gastroesophageal reflux disease)     with pregnancy only  . High cholesterol    She see Dr Alessandra Grout at [facility].   Social History:  Patient lives with her husband.  Outpatient Encounter Prescriptions as of 02/27/2015  Medication Sig  . ARIPiprazole (ABILIFY) 20 MG tablet Take 1 tablet (20 mg total) by mouth at bedtime.  . lamoTRIgine (LAMICTAL) 150 MG tablet Take 1 tablet (150 mg total) by mouth daily.  . traZODone (DESYREL) 100 MG tablet Take 1 tablet (100 mg total) by mouth at bedtime as needed for sleep.  Marland Kitchen venlafaxine XR (EFFEXOR-XR) 75 MG 24 hr capsule Take 1 capsule (75 mg total) by mouth daily with breakfast.  . [DISCONTINUED] ARIPiprazole (ABILIFY) 20 MG tablet Take 1 tablet (20 mg total) by mouth at bedtime.  . [DISCONTINUED] lamoTRIgine (LAMICTAL) 150 MG tablet Take 1 tablet (150  mg total) by mouth daily.  . [DISCONTINUED] venlafaxine XR (EFFEXOR-XR) 75 MG 24 hr capsule Take 1 capsule (75 mg total) by mouth daily with breakfast.   No facility-administered encounter medications on file as of 02/27/2015.   No results found for this or any previous visit (from the past 2160 hour(s)).   Past Psychiatric History/Hospitalization(s): Patient was recently admitted to behavioral [place] in August 2016 due to severe depression and having paranoia .  Patient is seeing in this office since 2009 status post inpatient psychiatric treatment and behavioral [place] and she completed  intensive outpatient program.  At that time she was admitted due to suicidal attempt by taking overdose on her medication.  In the past she has taken Prozac, Klonopin, Wellbutrin.  She has history of paranoia and delusions. Anxiety: Yes Bipolar Disorder: No Depression: Yes Mania: No Psychosis: Yes Schizophrenia: No Personality Disorder: No Hospitalization for psychiatric illness: Yes History of Electroconvulsive Shock Therapy: No Prior Suicide Attempts: Yes  Physical Exam: Constitutional:  BP 126/72 mmHg  Pulse 89  Ht 5\' 5"  (1.651 m)  Wt 165 lb 9.6 oz (75.116 kg)  BMI 27.56 kg/m2  LMP 01/27/2015 (Approximate)  General Appearance: well nourished.    Musculoskeletal: Strength & Muscle Tone: within normal limits Gait & Station: normal Patient leans: Patient has a normal posture  Mental Status Examination: Patient is casually dressed and fairly groomed.  She is pleasant and cooperative.  She maintained good eye contact.  She described her mood good and her affect is mood appropriate.  She denies any auditory or visual hallucination.  She denies any paranoia , delusion or any obsessive thoughts.  She denies any active or passive suicidal parts or homicidal thought.  She has no tremors, shakes or any EPS.  Her fund of knowledge is adequate.  She is alert and oriented x3.  Her psychomotor activity is normal.  Her insight judgment and impulse control is okay.  Established Problem, Stable/Improving (1), Review of Psycho-Social Stressors (1), Review of Last Therapy Session (1) and Review of Medication Regimen & Side Effects (2)  Assessment: Axis I: Maj. 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  She takes trazodone to 3 times a week as needed.  She has enough refill remaining.  I will continue Lamictal 150 mg daily, Effexor XR 75 mg in the morning and  Abilify 20 mg daily.  She has no tremors or shakes.  Encouraged to keep appointment with Hulen Luster for counseling.  Discussed medication side effects and benefits.  Recommended to call us back if she has any question or any concern.  Follow-up in 3 months.   Skyrah Krupp T., MD 02/27/2015

## 2015-03-25 ENCOUNTER — Other Ambulatory Visit (HOSPITAL_COMMUNITY): Payer: Self-pay | Admitting: Psychiatry

## 2015-05-29 ENCOUNTER — Ambulatory Visit (HOSPITAL_COMMUNITY): Payer: Self-pay | Admitting: Psychiatry

## 2015-08-15 ENCOUNTER — Telehealth: Payer: Self-pay | Admitting: Genetic Counselor

## 2015-08-15 ENCOUNTER — Encounter: Payer: Self-pay | Admitting: Genetic Counselor

## 2015-08-15 NOTE — Telephone Encounter (Signed)
Complete intake, pt has new insurance and has yet to receive new card, confirmed appt time/date, mailed new pt letter

## 2015-08-15 NOTE — Telephone Encounter (Signed)
Returned pt call regarding her self referral for genetic testing.

## 2015-08-19 ENCOUNTER — Ambulatory Visit (INDEPENDENT_AMBULATORY_CARE_PROVIDER_SITE_OTHER): Payer: BLUE CROSS/BLUE SHIELD | Admitting: Psychiatry

## 2015-08-19 ENCOUNTER — Encounter (HOSPITAL_COMMUNITY): Payer: Self-pay | Admitting: Psychiatry

## 2015-08-19 VITALS — BP 118/68 | HR 82 | Ht 65.0 in | Wt 191.6 lb

## 2015-08-19 DIAGNOSIS — F331 Major depressive disorder, recurrent, moderate: Secondary | ICD-10-CM

## 2015-08-19 MED ORDER — LAMOTRIGINE 150 MG PO TABS: 150.0000 mg | ORAL_TABLET | Freq: Every day | ORAL | Status: DC

## 2015-08-19 MED ORDER — ARIPIPRAZOLE 20 MG PO TABS: 20.0000 mg | ORAL_TABLET | Freq: Every day | ORAL | Status: DC

## 2015-08-19 MED ORDER — VENLAFAXINE HCL ER 75 MG PO CP24: 75.0000 mg | ORAL_CAPSULE | Freq: Every day | ORAL | Status: DC

## 2015-08-19 NOTE — Progress Notes (Signed)
[place] Progress Note  Zdenka Lydic [id number] 50 y.o.  08/19/2015 4:19 PM  Chief Complaint:  Medication management and follow-up.         History of Present Illness: Donna Black came for her follow-up appointment.  She missed last appointment because she has no insurance.  Apparently her husband dropped her from the insurance.  She was without medication until she started taking again 2 weeks ago.  She is feeling better.  She has moments of depression and anxiety but they are not as bad or intense.  She is actively looking for a job.  She is happy that her daughter got married and now she is in real estate business.  She is planning to spend more time with her son [name] to play tennis.  Patient has a good relationship with her ex-mother-in-law.  She sleeping good.  She is not able to see therapist because lack of insurance.  Patient has no tremors, shakes, EPS.  She denies any feeling of hopelessness or worthlessness.  She denies any suicidal thoughts.  She wants to continue Lamictal, Effexor and Abilify.  Patient denies drinking or using any illegal substances.  Her appetite is okay.  Her vital signs are stable.  Suicidal Ideation: No Plan Formed: No Patient has means to carry out plan: No  Homicidal Ideation: No Plan Formed: No Patient has means to carry out plan: No  Review of Systems  Constitutional: Negative.   HENT: Negative.   Respiratory: Negative.   Cardiovascular: Negative for chest pain and palpitations.  Gastrointestinal: Negative.   Musculoskeletal: Negative.   Skin: Negative for itching and rash.  Neurological: Negative for dizziness and tremors.  Psychiatric/Behavioral: Negative.    Psychiatric: Agitation: No Hallucination: No Depressed Mood: No Insomnia: No Hypersomnia: No Altered Concentration: No Feels Worthless: No Grandiose Ideas: No Belief In Special Powers: No New/Increased Substance Abuse: No Compulsions:  No  Neurologic: Headache: No Seizure: No Paresthesias: No  Past Medical History  Diagnosis Date  . Wears glasses   . Depression   . Heart murmur     told off and on in past that she has had one  . Grave's disease     diagnosed about 7 yrs ago but after a year she has been doing well  . Anemia     when she was younger  . Complication of anesthesia     ?, heart rate dropped w/wisdom teeth  . Cancer [facility]) 2013    bilateral breast cancer  . Pneumonia   . GERD (gastroesophageal reflux disease)     with pregnancy only  . High cholesterol    She see Dr Alessandra Grout at [facility].   Social History:  Patient lives with her husband.  Outpatient Encounter Prescriptions as of 08/19/2015  Medication Sig  . ARIPiprazole (ABILIFY) 20 MG tablet Take 1 tablet (20 mg total) by mouth at bedtime.  . lamoTRIgine (LAMICTAL) 150 MG tablet Take 1 tablet (150 mg total) by mouth daily.  . traZODone (DESYREL) 100 MG tablet Take 1 tablet (100 mg total) by mouth at bedtime as needed for sleep.  Marland Kitchen venlafaxine XR (EFFEXOR-XR) 75 MG 24 hr capsule Take 1 capsule (75 mg total) by mouth daily with breakfast.  . [DISCONTINUED] ARIPiprazole (ABILIFY) 20 MG tablet Take 1 tablet (20 mg total) by mouth at bedtime.  . [DISCONTINUED] lamoTRIgine (LAMICTAL) 150 MG tablet Take 1 tablet (150 mg total) by mouth daily.  . [DISCONTINUED] venlafaxine XR (EFFEXOR-XR) 75 MG 24 hr  capsule Take 1 capsule (75 mg total) by mouth daily with breakfast.   No facility-administered encounter medications on file as of 08/19/2015.   No results found for this or any previous visit (from the past 2160 hour(s)).   Past Psychiatric History/Hospitalization(s): Patient was recently admitted to behavioral [place] in August 2016 due to severe depression and having paranoia .  Patient is seeing in this office since 2009 status post inpatient psychiatric treatment and behavioral [place] and she completed intensive  outpatient program.  At that time she was admitted due to suicidal attempt by taking overdose on her medication.  In the past she has taken Prozac, Klonopin, Wellbutrin.  She has history of paranoia and delusions. Anxiety: Yes Bipolar Disorder: No Depression: Yes Mania: No Psychosis: Yes Schizophrenia: No Personality Disorder: No Hospitalization for psychiatric illness: Yes History of Electroconvulsive Shock Therapy: No Prior Suicide Attempts: Yes  Physical Exam: Constitutional:  BP 118/68 mmHg  Pulse 82  Ht 5\' 5"  (1.651 m)  Wt 191 lb 9.6 oz (86.909 kg)  BMI 31.88 kg/m2  General Appearance: well nourished.    Musculoskeletal: Strength & Muscle Tone: within normal limits Gait & Station: normal Patient leans: Patient has a normal posture  Mental Status Examination: Patient is casually dressed and fairly groomed.  She is pleasant and cooperative.  She maintained good eye contact.  She described her mood euthymic and her affect is appropriate.  She denies any auditory or visual hallucination.  She denies any paranoia , delusion or any obsessive thoughts.  She denies any active or passive suicidal parts or homicidal thought.  She has no tremors, shakes or any EPS.  Her fund of knowledge is adequate.  She is alert and oriented x3.  Her psychomotor activity is normal.  Her insight judgment and impulse control is okay.  Established Problem, Stable/Improving (1), Review of Psycho-Social Stressors (1), Review of Last Therapy Session (1) and Review of Medication Regimen & Side Effects (2)  Assessment: Axis I: Maj. 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  She is now have insurance and like to get 90 day supply from the pharmacy.  She has not taken trazodone and she does not need a new prescription but she will require Lamictal, Effexor and Abilify.  Discussed  medication side effects and benefits.  At this time she is not able to see counselor but hoping to get approval from insurance to see a therapist.  I will continue Lamictal 150 mg daily, Effexor XR 75 mg in the morning and Abilify 20 mg daily. Discussed medication side effects and benefits.  Recommended to call us back if she has any question or any concern.  Follow-up in 3 months.   Jasani Dolney T., MD 08/19/2015

## 2015-09-19 ENCOUNTER — Encounter: Payer: Self-pay | Admitting: Genetic Counselor

## 2015-09-22 ENCOUNTER — Telehealth: Payer: Self-pay | Admitting: Genetic Counselor

## 2015-09-22 ENCOUNTER — Other Ambulatory Visit: Payer: PRIVATE HEALTH INSURANCE

## 2015-09-22 ENCOUNTER — Ambulatory Visit (HOSPITAL_BASED_OUTPATIENT_CLINIC_OR_DEPARTMENT_OTHER): Payer: PRIVATE HEALTH INSURANCE | Admitting: Genetic Counselor

## 2015-09-22 ENCOUNTER — Encounter: Payer: Self-pay | Admitting: Genetic Counselor

## 2015-09-22 DIAGNOSIS — Z800 Family history of malignant neoplasm of digestive organs: Secondary | ICD-10-CM | POA: Diagnosis not present

## 2015-09-22 DIAGNOSIS — Z803 Family history of malignant neoplasm of breast: Secondary | ICD-10-CM | POA: Diagnosis not present

## 2015-09-22 DIAGNOSIS — Z315 Encounter for genetic counseling: Secondary | ICD-10-CM

## 2015-09-22 DIAGNOSIS — Z8041 Family history of malignant neoplasm of ovary: Secondary | ICD-10-CM | POA: Insufficient documentation

## 2015-09-22 NOTE — Telephone Encounter (Signed)
Pt called in to confirm her appt for the day and review insurance info.

## 2015-09-22 NOTE — Progress Notes (Signed)
REFERRING PROVIDER: Maurice Small, MD [place], [place] [address]   Lurline Del, MD  PRIMARY PROVIDER:  Jonathon Bellows, MD  PRIMARY REASON FOR VISIT:  1. Family history of breast cancer   2. Family history of ovarian cancer   3. Family history of pancreatic cancer      HISTORY OF PRESENT ILLNESS:   Donna Black, a 50 y.o. female, was seen for a [facility] cancer genetics consultation at the request of Dr. Jana Hakim due to a personal and family history of cancer.  Donna Black presents to clinic today to discuss the possibility of a hereditary predisposition to cancer, genetic testing, and to further clarify her future cancer risks, as well as potential cancer risks for family members.   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 This was treated with bilateral mastectomy.  Patient had genetic testing at that time that was reportedly negative. The patient's daughter was seen by genetics in [place], [place] and was told that her mother (the patient) should undergo updated genetic testing.    CANCER HISTORY:   No history exists.     HORMONAL RISK FACTORS:  Menarche was at age 80-12.  First live birth at age 14.  OCP use for approximately 0 years.  Ovaries intact: yes.  Hysterectomy: no.  Menopausal status: perimenopausal.  HRT use: 0 years. Colonoscopy: no; not examined. Mammogram within the last year: Bilateral mastectomy in 2013. Number of breast biopsies: mulitple fibroadenoma's removed at age 57 and 16. Up to date with pelvic exams:  yes. Any excessive radiation exposure in the past:  no  Past Medical History:  Diagnosis Date  . Anemia    when she was younger  . Cancer [facility]) 2013   bilateral breast cancer  . Complication of anesthesia    ?, heart rate dropped w/wisdom teeth  . Depression   . Family history of breast cancer   . Family history of ovarian cancer   . Family history of  pancreatic cancer   . GERD (gastroesophageal reflux disease)    with pregnancy only  . Grave's disease    diagnosed about 7 yrs ago but after a year she has been doing well  . Heart murmur    told off and on in past that she has had one  . High cholesterol   . Pneumonia   . Wears glasses     Past Surgical History:  Procedure Laterality Date  . [place], 2000   bilateral  . BREAST RECONSTRUCTION  05/12/2011   Procedure: BREAST RECONSTRUCTION;  Surgeon: Crissie Reese, MD;  Location: [place] [facility];  Service: Plastics;  Laterality: Bilateral;  PLACEMENT OF BILATERAL TISSUE EXPANDERS WITH POSSIBLE USE OF HD FLEX  . BREAST RECONSTRUCTION    . CESAREAN SECTION     2001,cyst; 2002 birth  . CHOLECYSTECTOMY    . MASTECTOMY Bilateral   . ORIF ANKLE FRACTURE Right 09/18/2013   Procedure: OPEN REDUCTION INTERNAL FIXATION (ORIF) RIGHT ANKLE FRACTURE;  Surgeon: Mcarthur Rossetti, MD;  Location: [facility];  Service: Orthopedics;  Laterality: Right;  . WISDOM TOOTH EXTRACTION      Social History   Social History  . Marital status: Married    Spouse name: N/A  . Number of children: 3  . Years of education: N/A   Social History Main Topics  . Smoking status: Current Some Day Smoker  Packs/day: 0.50    Years: 6.00    Types: Cigarettes    Last attempt to quit: 10/01/2013  . Smokeless tobacco: Never Used     Comment: trying to quit.  . Alcohol use 1.2 oz/week    2 Glasses of wine per week     Comment: sometimes daily or can just be weekends  . Drug use: No  . Sexual activity: Yes    Birth control/ protection: Condom   Other Topics Concern  . None   Social History Narrative  . None     FAMILY HISTORY:  We obtained a detailed, 4-generation family history.  Significant diagnoses are listed below: Family History  Problem Relation Age of Onset  . Heart disease Mother   . Stroke Mother   . COPD Mother   . Diabetes Mother   . Heart disease Father   . Lung cancer  Father 15  . Colon polyps Father   . Breast cancer Maternal Grandmother 31  . Pancreatic cancer Maternal Grandmother     dx in her 48s  . Ovarian cancer Paternal Aunt     dx in her 21s  . Breast cancer Other     MGM's sister; unilateral breast cancer  . Cancer Other     PGMs mother; either stomach or ovarian cancer    The patient has twin daughters and a son who are cancer free.  The patient has one brother who is cancer free.  Her mother passed away from COPD at 43.  Her mother had one brother who died from unknown causes.  The patient's maternal grandmother was diagnosed with breast cancer around age 70 and then pancratic cancer in her 42s.  The grandmother had a sister who also had breast cancer.  The patient's father was diagnosed with lung cancer this year.  He has had colon polyps in the past, and was a 60 year smoker.  He has two sisters, one who had ovarian cancer and died in her 5s.  His parents died of old age, but his maternal grandmother had either ovarian cancer or stomach cancer.  Patient's maternal ancestors are of [place] and [place] descent, and paternal ancestors are of Austrian/[name] descent. There is no reported Ashkenazi Jewish ancestry. There is no known consanguinity.  GENETIC COUNSELING ASSESSMENT: Donna Black is a 50 y.o. female with a personal history of ER- breast cancer and a family history of breast, pancreatic and ovarian cancer which is somewhat suggestive of a hereditary cancer syndrome and predisposition to cancer. We, therefore, discussed and recommended the following at today's visit.   DISCUSSION: We discussed that about 5-10% of breast cancer is hereditary with most cases due to BRCA mutations.  Her previous testing is reportedly negative. We discussed the purpose of updated testing and the genes that are involved in that testing.  Based on her bi-lineal risk, similar cancer syndromes would be tested for, as she has ovarian cancer on her  paternal side and pancreatic and breast cancer on the maternal side.  We discussed that there is overlap when considering genetic testing, and that the genes that cause both breast and pancreatic cancer are on the breast/ovarian cancer gene panel.  We reviewed the characteristics, features and inheritance patterns of hereditary cancer syndromes. We also discussed genetic testing, including the appropriate family members to test, the process of testing, insurance coverage and turn-around-time for results. We discussed the implications of a negative, positive and/or variant of uncertain significant result. We recommended Ms.  Black pursue genetic testing for the Breast/Ovarian cancer gene panel. The Breast/Ovarian gene panel offered by [facility] includes sequencing and rearrangement analysis for the following 20 genes:  ATM, BARD1, BRCA1, BRCA2, BRIP1, CDH1, CHEK2, EPCAM, FANCC, MLH1, MSH2, MSH6, NBN, PALB2, PMS2, PTEN, RAD51C, RAD51D, TP53, and XRCC2.     Based on Donna Black's personal and family history of cancer, she meets medical criteria for genetic testing. Despite that she meets criteria, she may still have an out of pocket cost. We discussed that if her out of pocket cost for testing is over $100, the laboratory will call and confirm whether she wants to proceed with testing.  If the out of pocket cost of testing is less than $100 she will be billed by the genetic testing laboratory.   PLAN: After considering the risks, benefits, and limitations, Donna Black  provided informed consent to pursue genetic testing and the blood sample was sent to [facility] for analysis of the Breast/Ovarian cancer panel. Results should be available within approximately 2-3 weeks' time, at which point they will be disclosed by telephone to Donna Black, as will any additional recommendations warranted by these results. Donna Black will receive a summary of her genetic counseling visit and a copy of her results once available.  This information will also be available in Epic. We encouraged Donna Black to remain in contact with cancer genetics annually so that we can continuously update the family history and inform her of any changes in cancer genetics and testing that may be of benefit for her family. Donna Black questions were answered to her satisfaction today. Our contact information was provided should additional questions or concerns arise.  Lastly, we encouraged Donna Black to remain in contact with cancer genetics annually so that we can continuously update the family history and inform her of any changes in cancer genetics and testing that may be of benefit for this family.   Ms.  Black questions were answered to her satisfaction today. Our contact information was provided should additional questions or concerns arise. Thank you for the referral and allowing [name] to share in the care of your patient.   Donna Black P. Florene Glen, [place], [facility] Certified Genetic Counselor [email or web address] phone: [phone]  The patient was seen for a total of 45 minutes in face-to-face genetic counseling.  This patient was discussed with Drs. Magrinat, Lindi Adie and/or Burr Medico who agrees with the above.    _______________________________________________________________________ For Office Staff:  Number of people involved in session: 1 Was an Intern/ student involved with case: no

## 2015-10-07 ENCOUNTER — Encounter: Payer: Self-pay | Admitting: Genetic Counselor

## 2015-10-07 ENCOUNTER — Telehealth: Payer: Self-pay | Admitting: Genetic Counselor

## 2015-10-07 DIAGNOSIS — Z1379 Encounter for other screening for genetic and chromosomal anomalies: Secondary | ICD-10-CM | POA: Insufficient documentation

## 2015-10-07 NOTE — Telephone Encounter (Signed)
LM on VM with good news.  Asked that she CB. 

## 2015-10-10 NOTE — Telephone Encounter (Signed)
LM on VM with good news.  Asked that she call back and gave CB instructions.

## 2015-10-14 NOTE — Telephone Encounter (Addendum)
Revealed negative genetic testing on the Breast/Ovarian cancer panel. Discussed that this does not explain her cancer or the cancer in the family.  Discussed the limitations of testing including other genes and technology not being able to identify hereditary changes.  Her daughter has found a lump in her breast and will get it checked out in September.  We did a [employer] and her daughters, twins, have a 25.6% lifetime risk for breast cancer. Discussed ACS recs with mammography and MRI starting at 69.  Explained that insurance does not always cover MRI.  Patient voiced understanding.

## 2015-10-15 ENCOUNTER — Ambulatory Visit: Payer: Self-pay | Admitting: Genetic Counselor

## 2015-10-15 DIAGNOSIS — D0510 Intraductal carcinoma in situ of unspecified breast: Secondary | ICD-10-CM

## 2015-10-15 DIAGNOSIS — Z8041 Family history of malignant neoplasm of ovary: Secondary | ICD-10-CM

## 2015-10-15 DIAGNOSIS — Z800 Family history of malignant neoplasm of digestive organs: Secondary | ICD-10-CM

## 2015-10-15 DIAGNOSIS — Z1379 Encounter for other screening for genetic and chromosomal anomalies: Secondary | ICD-10-CM

## 2015-10-15 DIAGNOSIS — Z803 Family history of malignant neoplasm of breast: Secondary | ICD-10-CM

## 2015-10-15 NOTE — Progress Notes (Signed)
HPI: Donna Black was previously seen in the [place] clinic due to a personal and family history of cancer and concerns regarding a hereditary predisposition to cancer. Please refer to our prior cancer genetics clinic note for more information regarding Donna Black's medical, social and family histories, and our assessment and recommendations, at the time. Donna Black recent genetic test results were disclosed to her, as were recommendations warranted by these results. These results and recommendations are discussed in more detail below.  FAMILY HISTORY:  We obtained a detailed, 4-generation family history.  Significant diagnoses are listed below: Family History  Problem Relation Age of Onset  . Heart disease Mother   . Stroke Mother   . COPD Mother   . Diabetes Mother   . Heart disease Father   . Lung cancer Father 36  . Colon polyps Father   . Breast cancer Maternal Grandmother 60  . Pancreatic cancer Maternal Grandmother     dx in her 45s  . Ovarian cancer Paternal Aunt     dx in her 44s  . Breast cancer Other     MGM's sister; unilateral breast cancer  . Cancer Other     PGMs mother; either stomach or ovarian cancer    The patient has twin daughters and a son who are cancer free. Both daughters have had breast lumps, one who is going in the beginning of September to have a lump checked out.  The patient has one brother who is cancer free.  Her mother passed away from COPD at 52.  Her mother had one brother who died from unknown causes.  The patient's maternal grandmother was diagnosed with breast cancer around age 62 and then pancratic cancer in her 34s.  The grandmother had a sister who also had breast cancer.  The patient's father was diagnosed with lung cancer this year.  He has had colon polyps in the past, and was a 60 year smoker.  He has two sisters, one who had ovarian cancer and died in her 13s.  His parents died of old age, but his maternal grandmother had either  ovarian cancer or stomach cancer.  Patient's maternal ancestors are of [place] and [place] descent, and paternal ancestors are of Austrian/[name] descent. There is no reported Ashkenazi Jewish ancestry. There is no known consanguinity.  GENETIC TEST RESULTS: At the time of Donna Black's visit, we recommended she pursue genetic testing of the Breast/Ovarian cancer gene panel. The Breast/Ovarian gene panel offered by [facility] includes sequencing and rearrangement analysis for the following 20 genes:  ATM, BARD1, BRCA1, BRCA2, BRIP1, CDH1, CHEK2, EPCAM, FANCC, MLH1, MSH2, MSH6, NBN, PALB2, PMS2, PTEN, RAD51C, RAD51D, TP53, and XRCC2.   The report date is October 03, 2015.  Genetic testing was normal, and did not reveal a deleterious mutation in these genes. The test report has been scanned into EPIC and is located under the Molecular Pathology section of the Results Review tab.   We discussed with Donna Black that since the current genetic testing is not perfect, it is possible there may be a gene mutation in one of these genes that current testing cannot detect, but that chance is small. We also discussed, that it is possible that another gene that has not yet been discovered, or that we have not yet tested, is responsible for the cancer diagnoses in the family, and it is, therefore, important to remain in touch with cancer genetics in the future so that we can continue  to offer Donna Black the most up to date genetic testing.   CANCER SCREENING RECOMMENDATIONS:  Given Donna Black's personal and family histories, we must interpret these negative results with some caution.  Families with features suggestive of hereditary risk for cancer tend to have multiple family members with cancer, diagnoses in multiple generations and diagnoses before the age of 52. Donna Black family exhibits some of these features. Thus this result may simply reflect our current inability to detect all mutations within these  genes or there may be a different gene that has not yet been discovered or tested.   RECOMMENDATIONS FOR FAMILY MEMBERS: Women in this family might be at some increased risk of developing cancer, over the general population risk, simply due to the family history of cancer. We recommended women in this family have a yearly mammogram beginning at age 75, or 77 years younger than the earliest onset of cancer, an an annual clinical breast exam, and perform monthly breast self-exams. Women in this family should also have a gynecological exam as recommended by their primary provider. All family members should have a colonoscopy by age 74.  FOLLOW-UP: Lastly, we discussed with Donna Black that cancer genetics is a rapidly advancing field and it is possible that new genetic tests will be appropriate for her and/or her family members in the future. We encouraged her to remain in contact with cancer genetics on an annual basis so we can update her personal and family histories and let her know of advances in cancer genetics that may benefit this family.   Our contact number was provided. Donna Black questions were answered to her satisfaction, and she knows she is welcome to call us at anytime with additional questions or concerns.   Donna Kayser, MS, [facility] Certified Genetic Counselor [email or web address]

## 2015-10-28 ENCOUNTER — Other Ambulatory Visit: Payer: Self-pay | Admitting: Family Medicine

## 2015-10-28 ENCOUNTER — Ambulatory Visit
Admission: RE | Admit: 2015-10-28 | Discharge: 2015-10-28 | Disposition: A | Discharge status: Home / Self Care | Payer: PRIVATE HEALTH INSURANCE | Source: Ambulatory Visit | Attending: Family Medicine | Admitting: Family Medicine

## 2015-10-28 DIAGNOSIS — Z801 Family history of malignant neoplasm of trachea, bronchus and lung: Secondary | ICD-10-CM

## 2015-10-28 DIAGNOSIS — R05 Cough: Secondary | ICD-10-CM

## 2015-10-28 DIAGNOSIS — R059 Cough, unspecified: Secondary | ICD-10-CM

## 2015-11-05 ENCOUNTER — Other Ambulatory Visit: Payer: Self-pay | Admitting: Family Medicine

## 2015-11-05 DIAGNOSIS — R0600 Dyspnea, unspecified: Secondary | ICD-10-CM

## 2015-11-06 ENCOUNTER — Encounter (INDEPENDENT_AMBULATORY_CARE_PROVIDER_SITE_OTHER): Payer: PRIVATE HEALTH INSURANCE | Admitting: Internal Medicine

## 2015-11-06 DIAGNOSIS — R0600 Dyspnea, unspecified: Secondary | ICD-10-CM

## 2015-11-06 LAB — PULMONARY FUNCTION TEST
DL/VA % PRED: 83 %
DL/VA: 4.12 ml/min/mmHg/L
DLCO UNC % PRED: 81 %
DLCO cor % pred: 85 %
DLCO cor: 21.76 ml/min/mmHg
DLCO unc: 20.84 ml/min/mmHg
FEF 25-75 PRE: 1.65 L/s
FEF 25-75 Post: 1.73 L/sec
FEF2575-%CHANGE-POST: 4 %
FEF2575-%PRED-POST: 61 %
FEF2575-%Pred-Pre: 58 %
FEV1-%CHANGE-POST: 1 %
FEV1-%Pred-Post: 82 %
FEV1-%Pred-Pre: 81 %
FEV1-PRE: 2.37 L
FEV1-Post: 2.4 L
FEV1FVC-%CHANGE-POST: 6 %
FEV1FVC-%Pred-Pre: 90 %
FEV6-%Change-Post: -4 %
FEV6-%PRED-PRE: 91 %
FEV6-%Pred-Post: 87 %
FEV6-PRE: 3.27 L
FEV6-Post: 3.11 L
FEV6FVC-%Change-Post: 0 %
FEV6FVC-%PRED-PRE: 102 %
FEV6FVC-%Pred-Post: 102 %
FVC-%Change-Post: -4 %
FVC-%PRED-POST: 85 %
FVC-%PRED-PRE: 89 %
FVC-POST: 3.13 L
FVC-PRE: 3.28 L
POST FEV1/FVC RATIO: 77 %
POST FEV6/FVC RATIO: 99 %
PRE FEV6/FVC RATIO: 100 %
Pre FEV1/FVC ratio: 72 %
RV % PRED: 130 %
RV: 2.4 L
TLC % pred: 110 %
TLC: 5.73 L

## 2015-11-20 ENCOUNTER — Ambulatory Visit (HOSPITAL_COMMUNITY): Payer: Self-pay | Admitting: Psychiatry

## 2015-12-18 ENCOUNTER — Ambulatory Visit (INDEPENDENT_AMBULATORY_CARE_PROVIDER_SITE_OTHER): Payer: Commercial Managed Care - PPO | Admitting: Physician Assistant

## 2016-01-19 ENCOUNTER — Other Ambulatory Visit (HOSPITAL_COMMUNITY): Payer: Self-pay | Admitting: Psychiatry

## 2016-01-19 DIAGNOSIS — F331 Major depressive disorder, recurrent, moderate: Secondary | ICD-10-CM

## 2016-01-23 NOTE — Telephone Encounter (Signed)
Medication management - Met with Dr. Adele Schilder after patient left a message she was now out of medications and needed new orders for Abilify, Lamictal and Effexor XR.  Dr. Adele Schilder approved new orders for each with plan to keep appointment 01/29/16. Telephone call with patient to inform Dr. Adele Schilder had approved her needed refills of Abilify, Lamictal and Effexor XR.  All 3 orders e-scribed to patient's [facility] [address] and reminded patient of her scheduled evaluation 01/29/16. Provided patient our new address.

## 2016-01-29 ENCOUNTER — Encounter (HOSPITAL_COMMUNITY): Payer: Self-pay | Admitting: Psychiatry

## 2016-01-29 ENCOUNTER — Ambulatory Visit (INDEPENDENT_AMBULATORY_CARE_PROVIDER_SITE_OTHER): Payer: Self-pay | Admitting: Psychiatry

## 2016-01-29 DIAGNOSIS — Z79899 Other long term (current) drug therapy: Secondary | ICD-10-CM

## 2016-01-29 DIAGNOSIS — F331 Major depressive disorder, recurrent, moderate: Secondary | ICD-10-CM

## 2016-01-29 MED ORDER — ARIPIPRAZOLE 20 MG PO TABS: ORAL_TABLET | ORAL | 0 refills | Status: DC

## 2016-01-29 MED ORDER — VENLAFAXINE HCL ER 75 MG PO CP24: ORAL_CAPSULE | ORAL | 0 refills | Status: DC

## 2016-01-29 MED ORDER — LAMOTRIGINE 150 MG PO TABS: 150.0000 mg | ORAL_TABLET | Freq: Every day | ORAL | 0 refills | Status: DC

## 2016-01-29 NOTE — Progress Notes (Signed)
[facility] MD Progress Note  Donna Black QM:[id number] 50 y.o.  01/29/2016 3:59 PM  Chief Complaint:  I'm doing fine.  I do not need trazodone.  I'm sleeping better.           History of Present Illness: Donna Black came for her follow-up appointment.  She is taking her medication except for trazodone because she sleeping good without it.  She had a good Thanksgiving.  She is thinking about going back to school.  Her father died in January 10, 2023 and she was very sad.  She like to work with cancer patient in the future .  She is happy that she keeps her son Donna Black on Tuesdays and Thursdays and every other Sunday.  She endorse her twin daughters are also doing very well.  Both are in real estate business.  Patient denies any irritability, crying spells, feeling of hopelessness or worthlessness.  Patient is in contact with her ex-husband when needed and she likes that friendship relationship.  Patient denies any tremors, shakes or any EPS.  Recently her primary care physician started her on phentermine for weight loss.  Her appetite is okay.  Her vital signs are stable.  Patient denies drinking alcohol or using any illegal substances.  Suicidal Ideation: No Plan Formed: No Patient has means to carry out plan: No  Homicidal Ideation: No Plan Formed: No Patient has means to carry out plan: No  Review of Systems  Constitutional: Negative.   HENT: Negative.   Cardiovascular: Negative.  Negative for palpitations.  Musculoskeletal: Negative.   Skin: Negative.   Neurological: Negative for dizziness and tremors.  Psychiatric/Behavioral: Negative.    Neurologic: Headache: No Seizure: No Paresthesias: No  Past Medical History:  Diagnosis Date  . Anemia    when she was younger  . Cancer [facility]) 2013   bilateral breast cancer  . Complication of anesthesia    ?, heart rate dropped w/wisdom teeth  . Depression   . Family history of breast cancer   . Family history of ovarian cancer   .  Family history of pancreatic cancer   . GERD (gastroesophageal reflux disease)    with pregnancy only  . Grave's disease    diagnosed about 7 yrs ago but after a year she has been doing well  . Heart murmur    told off and on in past that she has had one  . High cholesterol   . Pneumonia   . Wears glasses    She see [facility] Alessandra Grout at [facility].   Social History:  Patient lives with her husband.  Outpatient Encounter Prescriptions as of 01/29/2016  Medication Sig Dispense Refill  . ARIPiprazole (ABILIFY) 20 MG tablet TAKE 1 TABLET BY MOUTH ATBEDTIME 90 tablet 0  . lamoTRIgine (LAMICTAL) 150 MG tablet Take 1 tablet (150 mg total) by mouth daily. 90 tablet 0  . traZODone (DESYREL) 100 MG tablet Take 1 tablet (100 mg total) by mouth at bedtime as needed for sleep. 30 tablet 1  . venlafaxine XR (EFFEXOR-XR) 75 MG 24 hr capsule TAKE 1 CAPSULE BY MOUTH DAILY WITH BREAKFAST 90 capsule 0  . [DISCONTINUED] ARIPiprazole (ABILIFY) 20 MG tablet TAKE 1 TABLET BY MOUTH ATBEDTIME 90 tablet 0  . [DISCONTINUED] lamoTRIgine (LAMICTAL) 150 MG tablet TAKE 1 TABLET BY MOUTH DAILY 90 tablet 0  . [DISCONTINUED] venlafaxine XR (EFFEXOR-XR) 75 MG 24 hr capsule TAKE 1 CAPSULE BY MOUTH DAILY WITH BREAKFAST 90 capsule 0   No facility-administered encounter medications on  file as of 01/29/2016.    Recent Results (from the past 2160 hour(s))  Pulmonary function test     Status: None   Collection Time: 11/06/15 10:43 AM  Result Value Ref Range   FVC-Pre 3.28 L   FVC-%Pred-Pre 89 %   FVC-Post 3.13 L   FVC-%Pred-Post 85 %   FVC-%Change-Post -4 %   FEV1-Pre 2.37 L   FEV1-%Pred-Pre 81 %   FEV1-Post 2.40 L   FEV1-%Pred-Post 82 %   FEV1-%Change-Post 1 %   FEV6-Pre 3.27 L   FEV6-%Pred-Pre 91 %   FEV6-Post 3.11 L   FEV6-%Pred-Post 87 %   FEV6-%Change-Post -4 %   Pre FEV1/FVC ratio 72 %   FEV1FVC-%Pred-Pre 90 %   Post FEV1/FVC ratio 77 %   FEV1FVC-%Change-Post 6 %   Pre FEV6/FVC Ratio 100 %    FEV6FVC-%Pred-Pre 102 %   Post FEV6/FVC ratio 99 %   FEV6FVC-%Pred-Post 102 %   FEV6FVC-%Change-Post 0 %   FEF 25-75 Pre 1.65 L/sec   FEF2575-%Pred-Pre 58 %   FEF 25-75 Post 1.73 L/sec   FEF2575-%Pred-Post 61 %   FEF2575-%Change-Post 4 %   RV 2.40 L   RV % pred 130 %   TLC 5.73 L   TLC % pred 110 %   DLCO unc 20.84 ml/min/mmHg   DLCO unc % pred 81 %   DLCO cor 21.76 ml/min/mmHg   DLCO cor % pred 85 %   DL/VA 4.12 ml/min/mmHg/L   DL/VA % pred 83 %     Past Psychiatric History/Hospitalization(s): Patient was recently admitted to behavioral [name] in August 2016 due to severe depression and having paranoia .  Patient is seeing in this office since 2009 status post inpatient psychiatric treatment and behavioral [facility] [place] and she completed intensive outpatient program.  At that time she was admitted due to suicidal attempt by taking overdose on her medication.  In the past she has taken Prozac, Klonopin, Wellbutrin.  She has history of paranoia and delusions. Anxiety: Yes Bipolar Disorder: No Depression: Yes Mania: No Psychosis: Yes Schizophrenia: No Personality Disorder: No Hospitalization for psychiatric illness: Yes History of Electroconvulsive Shock Therapy: No Prior Suicide Attempts: Yes  Physical Exam: Constitutional:  BP 126/86 (BP Location: Left Arm, Patient Position: Sitting, Cuff Size: Normal)   Pulse 99   Ht 5\' 5"  (1.651 m)   Wt 200 lb 6.4 oz (90.9 kg)   BMI 33.35 kg/m   General Appearance: alert, oriented, no acute distress.    Musculoskeletal: Strength & Muscle Tone: within normal limits Gait & Station: normal Patient leans: N/A  Mental Status Examination: Patient is casually dressed and groomed.  She maintained good eye contact.  She is pleasant and cooperative and described her mood good and her affect is appropriate.  She denies any auditory or visual hallucination.  She denies any active or passive suicidal thoughts or homicidal thought.   There were no delusions, paranoia or any obsessive thoughts.  She has no flight of ideas or any loose association.  Her psychomotor activity is normal.  She has no tremors, shakes or any EPS.  Her fund of knowledge is adequate.  She's alert and oriented 3.  Her insight judgment and impulse control is okay.  Established Problem, Stable/Improving (1), Review of Last Therapy Session (1) and Review of Medication Regimen & Side Effects (2)  Assessment: Axis I: Major depressive disorder, recurrent.  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    Plan:  Patient is a stable on her current  psychiatric medication.  She wants to continue her current psychiatric medication.  She has no side effects.  I will continue Effexor 75 mg daily, Abilify 20 mg daily, Effexor XR 75 mg in the morning and Lamictal 150 mg daily.  Discussed medication side effects and benefits.  Recommended to call us back if she has any question, concern if she feels worsening of the symptom.  Follow-up in 6 months.  Kendryck Lacroix T., MD 01/29/2016

## 2016-06-29 ENCOUNTER — Other Ambulatory Visit (HOSPITAL_COMMUNITY): Payer: Self-pay | Admitting: Psychiatry

## 2016-06-29 DIAGNOSIS — F331 Major depressive disorder, recurrent, moderate: Secondary | ICD-10-CM

## 2016-06-30 NOTE — Telephone Encounter (Signed)
Medication management - Met with Dr. Adele Schilder who approved a new 90 day order for patient's Venlafaxine XR as patient does not return until 07/29/16.  New order e-scribed to patient's [facility] as approved by Dr. Adele Schilder for one time 90 day order, Effexor XR 75 mg, one daily with breakfast.

## 2016-07-02 ENCOUNTER — Telehealth (HOSPITAL_COMMUNITY): Payer: Self-pay

## 2016-07-02 NOTE — Telephone Encounter (Signed)
Medication management - Called patient's [employer] and then [facility] to inform patient does not need a prior authorization to fill Venlafaxine XR prescription as patient pays for this out-of-pocket with no medication coverage at this time.  Patient to call if any problems getting medication filled.

## 2016-07-05 ENCOUNTER — Telehealth (HOSPITAL_COMMUNITY): Payer: Self-pay

## 2016-07-05 NOTE — Telephone Encounter (Signed)
Patient has an appointment in June, however she feels she may need a medication adjustment. She states she is feeling more emotional lately - she just bought a house, divorce is almost final and she is starting a new job. She said she is just feeling a little overwhelmed and emotional and was wondering if you could increase the Lamictal for a few months until she is feeling better. Please review and advise, thank you

## 2016-07-06 NOTE — Telephone Encounter (Signed)
She can try Lamictal 200 mg but carefully watch for rash

## 2016-07-07 MED ORDER — LAMOTRIGINE 200 MG PO TABS: 200.0000 mg | ORAL_TABLET | Freq: Every day | ORAL | 0 refills | Status: DC

## 2016-07-07 NOTE — Telephone Encounter (Signed)
I called patient and let her know I was sending a 30 day supply of the Lamictal 200 mg to [facility]. Patient stated she needed to move her appointment time to 8 am, there was not an 8 am spot on the day she was due to come in so we moved her appointment to next week.

## 2016-07-12 ENCOUNTER — Other Ambulatory Visit (HOSPITAL_COMMUNITY): Payer: Self-pay | Admitting: Psychiatry

## 2016-07-12 ENCOUNTER — Other Ambulatory Visit (HOSPITAL_COMMUNITY): Payer: Self-pay

## 2016-07-12 ENCOUNTER — Encounter (HOSPITAL_COMMUNITY): Payer: Self-pay | Admitting: Psychiatry

## 2016-07-12 ENCOUNTER — Ambulatory Visit (INDEPENDENT_AMBULATORY_CARE_PROVIDER_SITE_OTHER): Payer: PRIVATE HEALTH INSURANCE | Admitting: Psychiatry

## 2016-07-12 VITALS — BP 120/82 | HR 98 | Ht 65.0 in | Wt 201.0 lb

## 2016-07-12 DIAGNOSIS — F17210 Nicotine dependence, cigarettes, uncomplicated: Secondary | ICD-10-CM | POA: Diagnosis not present

## 2016-07-12 DIAGNOSIS — F333 Major depressive disorder, recurrent, severe with psychotic symptoms: Secondary | ICD-10-CM

## 2016-07-12 DIAGNOSIS — Z79899 Other long term (current) drug therapy: Secondary | ICD-10-CM

## 2016-07-12 DIAGNOSIS — F331 Major depressive disorder, recurrent, moderate: Secondary | ICD-10-CM

## 2016-07-12 MED ORDER — VENLAFAXINE HCL ER 75 MG PO CP24: ORAL_CAPSULE | ORAL | 0 refills | Status: DC

## 2016-07-12 MED ORDER — LAMOTRIGINE 200 MG PO TABS: 200.0000 mg | ORAL_TABLET | Freq: Every day | ORAL | 0 refills | Status: DC

## 2016-07-12 MED ORDER — ARIPIPRAZOLE 20 MG PO TABS
ORAL_TABLET | ORAL | 0 refills | Status: DC
Start: 2016-07-12 — End: 2016-10-29

## 2016-07-12 NOTE — Addendum Note (Signed)
Addended by: Lethea Killings on: 07/12/2016 10:23 AM   Modules accepted: Orders

## 2016-07-12 NOTE — Progress Notes (Signed)
[facility] MD/PA/NP OP Progress Note  07/12/2016 8:32 AM Donna Black  MRN:  [id number]  Chief Complaint:  Chief Complaint    Follow-up     Subjective:  I am anxious and emotional.  I have too many things going on.  HPI: Donna Black came for her follow-up appointment earlier than her scheduled appointment.  She called because she was feeling anxious nervous and wanted to adjust her medication.  Patient told a lot of things going on in her life.  Her divorce is getting final.  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  She is very anxious and emotional and afraid that her sickness may come back.  She admitted some time paranoid that people talking about her but overall she feels medicine working very well.  She also started taking phentermine by Dr. Arbie Cookey lab at [place].  She continues to keep [place] on to state has taken every other 05/25/22.  Her father died last 2022/12/24.  Patient also started a new relationship and she like this person.  Patient denies any feeling of hopelessness or worthlessness but sometime get emotional tearful and paranoid.  She denies binge drinking or using any illegal substances.  Her energy level is good.  She hasn't started her exercise.  She is taking Lamictal 150, we recommended to take 200 but she has not picked up the prescription yet.  Patient also trying to get closer to her family members and recently had a get together with her cousin and she really enjoyed her time.  Her twin daughters are doing very well.  Patient denies any agitation, anger, hallucination or any self abusive behavior.  She has no rash, itching, tremors or shakes.  Her vital signs are stable.  She sleeping good and does not require trazodone.  Visit Diagnosis:    ICD-9-CM ICD-10-CM   1. Major depressive disorder, recurrent episode, moderate (HCC) 296.32 F33.1 venlafaxine XR (EFFEXOR-XR) 75 MG 24 hr capsule     ARIPiprazole (ABILIFY) 20 MG tablet   2. Encounter for long-term (current) use of medications V58.69 Z79.899 Hemoglobin A1c     COMPLETE METABOLIC PANEL WITH GFR     CBC with Differential/Platelet    Past Psychiatric History: Reviewed. Patient seen in this office since 2009 after she was released from behavioral [facility] and completed intensive outpatient program.  She was admitted due to severe depression and paranoid thinking.  She also took overdose her medication for a suicidal attempt.  She was again admitted to behavioral [facility] in August 2016 due to severe depression and paranoid delusions.  In the past she has taken Prozac, Klonopin, Wellbutrin.   she took trazodone which help her insomnia but stopped when sleep improved.    Past Medical History:  Past Medical History:  Diagnosis Date  . Anemia    when she was younger  . Cancer [facility]) 2013   bilateral breast cancer  . Complication of anesthesia    ?, heart rate dropped w/wisdom teeth  . Depression   . Family history of breast cancer   . Family history of ovarian cancer   . Family history of pancreatic cancer   . GERD (gastroesophageal reflux disease)    with pregnancy only  . Grave's disease    diagnosed about 7 yrs ago but after a year she has been doing well  . Heart murmur    told off and on in past that she has had one  .  High cholesterol   . Pneumonia   . Wears glasses     Past Surgical History:  Procedure Laterality Date  . [place], 2000   bilateral  . BREAST RECONSTRUCTION  05/12/2011   Procedure: BREAST RECONSTRUCTION;  Surgeon: Crissie Reese, MD;  Location: [facility];  Service: Plastics;  Laterality: Bilateral;  PLACEMENT OF BILATERAL TISSUE EXPANDERS WITH POSSIBLE USE OF HD FLEX  . BREAST RECONSTRUCTION    . CESAREAN SECTION     2001,cyst; 2002 birth  . CHOLECYSTECTOMY    . MASTECTOMY Bilateral   . ORIF ANKLE FRACTURE Right 09/18/2013   Procedure: OPEN REDUCTION INTERNAL FIXATION (ORIF) RIGHT ANKLE FRACTURE;   Surgeon: Mcarthur Rossetti, MD;  Location: [facility];  Service: Orthopedics;  Laterality: Right;  . WISDOM TOOTH EXTRACTION      Family Psychiatric History: Reviewed.  Family History:  Family History  Problem Relation Age of Onset  . Heart disease Mother   . Stroke Mother   . COPD Mother   . Diabetes Mother   . Heart disease Father   . Lung cancer Father 40  . Colon polyps Father   . Breast cancer Maternal Grandmother 34  . Pancreatic cancer Maternal Grandmother        dx in her 57s  . Ovarian cancer Paternal Aunt        dx in her 45s  . Breast cancer Other        MGM's sister; unilateral breast cancer  . Cancer Other        PGMs mother; either stomach or ovarian cancer    Social History:  Social History   Social History  . Marital status: Married    Spouse name: N/A  . Number of children: 3  . Years of education: N/A   Social History Main Topics  . Smoking status: Current Some Day Smoker    Packs/day: 0.20    Years: 6.00    Types: Cigarettes    Last attempt to quit: 10/01/2013  . Smokeless tobacco: Never Used     Comment: trying to quit.  . Alcohol use Yes     Comment: Occasional use with dinner  . Drug use: No  . Sexual activity: Not Currently    Birth control/ protection: Condom   Other Topics Concern  . None   Social History Narrative  . None    Allergies:  Allergies  Allergen Reactions  . Clindamycin/Lincomycin Anaphylaxis  . Lunesta [Eszopiclone] Other (See Comments)    Causes loss of memory, confusion  . Penicillins Other (See Comments)    Did not work as a child    Metabolic Disorder Labs: Lab Results  Component Value Date   HGBA1C 5.3 10/04/2014   MPG 105 10/04/2014   No results found for: PROLACTIN Lab Results  Component Value Date   CHOL 232 (H) 10/04/2014   TRIG 94 10/04/2014   HDL 45 10/04/2014   CHOLHDL 5.2 10/04/2014   VLDL 19 10/04/2014   LDLCALC 168 (H) 10/04/2014     Current Medications: Current Outpatient  Prescriptions  Medication Sig Dispense Refill  . ARIPiprazole (ABILIFY) 20 MG tablet TAKE 1 TABLET BY MOUTH ATBEDTIME 90 tablet 0  . lamoTRIgine (LAMICTAL) 150 MG tablet Take 1 tablet (150 mg total) by mouth daily. 90 tablet 0  . lamoTRIgine (LAMICTAL) 200 MG tablet Take 1 tablet (200 mg total) by mouth daily. 30 tablet 0  . traZODone (DESYREL) 100 MG tablet Take 1 tablet (100 mg total)  by mouth at bedtime as needed for sleep. 30 tablet 1  . venlafaxine XR (EFFEXOR-XR) 75 MG 24 hr capsule TAKE 1 CAPSULE BY MOUTH DAILY WITH BREAKFAST 90 capsule 0   No current facility-administered medications for this visit.     Neurologic: Headache: No Seizure: No Paresthesias: No  Musculoskeletal: Strength & Muscle Tone: within normal limits Gait & Station: normal Patient leans: N/A  Psychiatric Specialty Exam: Review of Systems  Constitutional: Negative.   HENT: Negative.   Respiratory: Negative.   Cardiovascular: Negative.   Gastrointestinal: Negative.   Musculoskeletal: Negative.   Skin: Negative.  Negative for itching and rash.  Neurological: Negative.   Psychiatric/Behavioral: The patient is nervous/anxious.     Blood pressure 120/82, pulse 98, height 5\' 5"  (1.651 m), weight 201 lb (91.2 kg), SpO2 98 %.Body mass index is 33.45 kg/m.  General Appearance: Casual  Eye Contact:  Good  Speech:  Clear and Coherent  Volume:  Normal  Mood:  Anxious and Emotional  Affect:  Constricted  Thought Process:  Goal Directed  Orientation:  Full (Time, Place, and Person)  Thought Content: Rumination   Suicidal Thoughts:  No  Homicidal Thoughts:  No  Memory:  Immediate;   Good Recent;   Good Remote;   Good  Judgement:  Good  Insight:  Good  Psychomotor Activity:  Normal  Concentration:  Concentration: Good and Attention Span: Good  Recall:  Good  Fund of Knowledge: Good  Language: Good  Akathisia:  No  Handed:  Right  AIMS (if indicated):  0  Assets:  Communication Skills Desire for  Improvement Housing Resilience Social Support  ADL's:  Intact  Cognition: WNL  Sleep:  Good     Assessment:  Major depressive disorder, recurrent with psychosis.  Plan: Patient requested earlier appointment because she is very emotional and anxious.  She is going to start new job next week, recently bought a house and also started a new relationship.  She has not started Lamictal 200 which was recommended on the phone because she was out of the town.  Recommended to increase Lamictal 200 mg daily, continue Effexor 75 mg daily and Abilify 20 mg daily.  I would discontinue trazodone since patient is sleeping much better.  We do not have blood work results for a while and we will do blood work including CBC, CMP and hemoglobin A1c.  Reassurance given.  Patient is not seeing any therapist and does not feel she need to see at this time.  Discusses psychosocial stressors and is strongly encourage to call us back if she feels medicine is not helping are symptoms started to get worse.  Discuss safety concern that anytime having active suicidal thoughts or homicidal thoughts and she need to call 911 or go to the local emergency room.  Follow-up in 3 months.  Time spent 25 minutes, discuss psychosocial stressors, psychoeducation, long-term prognosis and medication efficacy.  Tremon Sainvil T., MD 07/12/2016, 8:32 AM

## 2016-07-13 LAB — COMPREHENSIVE METABOLIC PANEL
ALBUMIN: 4.2 g/dL (ref 3.5–5.5)
ALT: 20 IU/L (ref 0–32)
AST: 23 IU/L (ref 0–40)
Albumin/Globulin Ratio: 1.7 (ref 1.2–2.2)
Alkaline Phosphatase: 64 IU/L (ref 39–117)
BILIRUBIN TOTAL: 0.2 mg/dL (ref 0.0–1.2)
BUN / CREAT RATIO: 17 (ref 9–23)
BUN: 13 mg/dL (ref 6–24)
CHLORIDE: 100 mmol/L (ref 96–106)
CO2: 26 mmol/L (ref 18–29)
CREATININE: 0.75 mg/dL (ref 0.57–1.00)
Calcium: 9.4 mg/dL (ref 8.7–10.2)
GFR, EST AFRICAN AMERICAN: 107 mL/min/{1.73_m2} (ref >59)
GFR, EST NON AFRICAN AMERICAN: 93 mL/min/{1.73_m2} (ref >59)
GLUCOSE: 95 mg/dL (ref 65–99)
Globulin, Total: 2.5 g/dL (ref 1.5–4.5)
Potassium: 5.3 mmol/L — ABNORMAL HIGH (ref 3.5–5.2)
Sodium: 139 mmol/L (ref 134–144)
TOTAL PROTEIN: 6.7 g/dL (ref 6.0–8.5)

## 2016-07-13 LAB — CBC WITH DIFFERENTIAL/PLATELET
BASOS ABS: 0.1 10*3/uL (ref 0.0–0.2)
Basos: 1 %
EOS (ABSOLUTE): 0.7 10*3/uL — AB (ref 0.0–0.4)
EOS: 9 %
HEMOGLOBIN: 13.9 g/dL (ref 11.1–15.9)
Hematocrit: 41.6 % (ref 34.0–46.6)
IMMATURE GRANS (ABS): 0 10*3/uL (ref 0.0–0.1)
Immature Granulocytes: 0 %
LYMPHS ABS: 1.8 10*3/uL (ref 0.7–3.1)
Lymphs: 24 %
MCH: 33.1 pg — ABNORMAL HIGH (ref 26.6–33.0)
MCHC: 33.4 g/dL (ref 31.5–35.7)
MCV: 99 fL — ABNORMAL HIGH (ref 79–97)
MONOS ABS: 0.7 10*3/uL (ref 0.1–0.9)
Monocytes: 9 %
NEUTROS PCT: 57 %
Neutrophils Absolute: 4.3 10*3/uL (ref 1.4–7.0)
Platelets: 299 10*3/uL (ref 150–379)
RBC: 4.2 x10E6/uL (ref 3.77–5.28)
RDW: 13.5 % (ref 12.3–15.4)
WBC: 7.4 10*3/uL (ref 3.4–10.8)

## 2016-07-13 LAB — HGB A1C W/O EAG: Hgb A1c MFr Bld: 5.2 % (ref 4.8–5.6)

## 2016-07-22 ENCOUNTER — Ambulatory Visit (INDEPENDENT_AMBULATORY_CARE_PROVIDER_SITE_OTHER): Payer: PRIVATE HEALTH INSURANCE | Admitting: Physician Assistant

## 2016-07-29 ENCOUNTER — Ambulatory Visit (HOSPITAL_COMMUNITY): Payer: Self-pay | Admitting: Psychiatry

## 2016-08-02 ENCOUNTER — Encounter (INDEPENDENT_AMBULATORY_CARE_PROVIDER_SITE_OTHER): Payer: Self-pay | Admitting: Physician Assistant

## 2016-08-02 ENCOUNTER — Ambulatory Visit (INDEPENDENT_AMBULATORY_CARE_PROVIDER_SITE_OTHER): Payer: No Typology Code available for payment source | Admitting: Physician Assistant

## 2016-08-02 DIAGNOSIS — M25562 Pain in left knee: Secondary | ICD-10-CM | POA: Diagnosis not present

## 2016-08-02 DIAGNOSIS — M170 Bilateral primary osteoarthritis of knee: Secondary | ICD-10-CM | POA: Insufficient documentation

## 2016-08-02 DIAGNOSIS — M25561 Pain in right knee: Secondary | ICD-10-CM | POA: Diagnosis not present

## 2016-08-02 DIAGNOSIS — M1712 Unilateral primary osteoarthritis, left knee: Secondary | ICD-10-CM | POA: Diagnosis not present

## 2016-08-02 DIAGNOSIS — M1711 Unilateral primary osteoarthritis, right knee: Secondary | ICD-10-CM | POA: Diagnosis not present

## 2016-08-02 MED ORDER — LIDOCAINE HCL 1 % IJ SOLN
3.0000 mL | INTRAMUSCULAR | Status: AC | PRN
  Administered 2016-08-02: 3 mL

## 2016-08-02 MED ORDER — METHYLPREDNISOLONE ACETATE 40 MG/ML IJ SUSP
40.0000 mg | INTRAMUSCULAR | Status: AC | PRN
  Administered 2016-08-02: 40 mg via INTRA_ARTICULAR

## 2016-08-02 NOTE — Progress Notes (Addendum)
   Procedure Note  Patient: Donna Black             Date of Birth: 07-29-1965           MRN: [id number]             Visit Date: 08/02/2016 Donna Black returns today follow-up of bilateral knee pain. She has known medial and patellofemoral compartment arthritis of the right knee had tricompartmental arthritis of the left knee. She's had no new injury to either knee. She's done well with injections in the past. She is asking for injections again in both knees. She is trying to lose weight. She does state that both knees feel we shake with her she's gone up or down stairs. She has pain in the quad region the left knee she describes a sharp when getting up from a seated position. Otherwise no mechanical symptoms either knee. No new injuries.  Physical exam: No effusion abnormal warmth of either knee. She has significant crepitus with passive range of motion both knees. Medial and lateral joint line tenderness right knee. Left knee medial joint line tenderness. Procedures: Visit Diagnoses: Primary osteoarthritis of both knees  Large Joint Inj Date/Time: 08/02/2016 11:56 AM Performed by: Pete Pelt Authorized by: Pete Pelt   Consent Given by:  Patient Indications:  Pain Location:  Knee Site:  L knee Needle Size:  22 G Approach:  Anterolateral Ultrasound Guidance: No   Fluoroscopic Guidance: No   Medications:  40 mg methylPREDNISolone acetate 40 MG/ML; 3 mL lidocaine 1 % Aspiration Attempted: No   Patient tolerance:  Patient tolerated the procedure well with no immediate complications Large Joint Inj Date/Time: 08/02/2016 11:57 AM Performed by: Pete Pelt Authorized by: Pete Pelt   Consent Given by:  Patient Indications:  Pain Location:  Knee Site:  R knee Needle Size:  22 G Approach:  Anterolateral Ultrasound Guidance: No   Fluoroscopic Guidance: No   Medications:  40 mg methylPREDNISolone acetate 40 MG/ML; 3 mL lidocaine 1 % Aspiration Attempted: No     Patient tolerance:  Patient tolerated the procedure well with no immediate complications   Plan: She is given a handout for Monovisc injection. Continue to work on Forensic scientist. Also talked with her about the benefits of the knee weight loss that she is able to obtain. Both injections and she is quite nervous with and had some discomfort with that no complications.

## 2016-10-12 ENCOUNTER — Ambulatory Visit (HOSPITAL_COMMUNITY): Payer: Self-pay | Admitting: Psychiatry

## 2016-10-12 ENCOUNTER — Other Ambulatory Visit (HOSPITAL_COMMUNITY): Payer: Self-pay

## 2016-10-12 MED ORDER — LAMOTRIGINE 200 MG PO TABS: 200.0000 mg | ORAL_TABLET | Freq: Every day | ORAL | 0 refills | Status: DC

## 2016-10-29 ENCOUNTER — Other Ambulatory Visit (HOSPITAL_COMMUNITY): Payer: Self-pay

## 2016-10-29 DIAGNOSIS — F331 Major depressive disorder, recurrent, moderate: Secondary | ICD-10-CM

## 2016-10-29 MED ORDER — VENLAFAXINE HCL ER 75 MG PO CP24: ORAL_CAPSULE | ORAL | 0 refills | Status: DC

## 2016-10-29 MED ORDER — LAMOTRIGINE 200 MG PO TABS: 200.0000 mg | ORAL_TABLET | Freq: Every day | ORAL | 0 refills | Status: DC

## 2016-10-29 MED ORDER — ARIPIPRAZOLE 20 MG PO TABS: ORAL_TABLET | ORAL | 0 refills | Status: DC

## 2016-11-03 ENCOUNTER — Encounter (HOSPITAL_COMMUNITY): Payer: Self-pay | Admitting: Psychiatry

## 2016-11-03 ENCOUNTER — Ambulatory Visit (INDEPENDENT_AMBULATORY_CARE_PROVIDER_SITE_OTHER): Payer: PRIVATE HEALTH INSURANCE | Admitting: Psychiatry

## 2016-11-03 DIAGNOSIS — F331 Major depressive disorder, recurrent, moderate: Secondary | ICD-10-CM

## 2016-11-03 MED ORDER — VENLAFAXINE HCL ER 75 MG PO CP24: ORAL_CAPSULE | ORAL | 0 refills | Status: DC

## 2016-11-03 MED ORDER — ARIPIPRAZOLE 20 MG PO TABS: ORAL_TABLET | ORAL | 0 refills | Status: DC

## 2016-11-03 MED ORDER — LAMOTRIGINE 200 MG PO TABS: 200.0000 mg | ORAL_TABLET | Freq: Every day | ORAL | 0 refills | Status: DC

## 2016-11-03 NOTE — Progress Notes (Signed)
[facility] MD/PA/NP OP Progress Note  11/03/2016 11:05 AM Donna Black  MRN:  [id number]  Chief Complaint: I'm doing good.  I'm taking medication.  Increase Lamictal helping my mood. Chief Complaint    Follow-up     HPI: Donna Black came for her follow-up appointment.  She is taking her medication as prescribed.  On her last visit we increase Lamictal and she is taking 200 mg daily.  She has no rash, itching, tremors or shakes.  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  She is also ended her new relationship because she got upset when she found another woman testing him.  Patient realized that she still have trust issues and now she is trying to have her ex-husband to get therapy to help joint parenting.  Her divorce is not final yet.  She recently seen her primary care physician Dr. Arbie Cookey tablet and found that she has high cholesterol.  We did her blood work in May which was normal except for mildly relation of potassium.  Patient continues to keep [name] her 59 year old son every other "Sunday.  Patient denies any irritability, anger, mania or any psychosis.  At this time she is not interested in any new relationship.  She is not drinking or using any illegal substances.  Her sleep is good.  Her energy level is good.  She denies any paranoia, hallucination or any suicidal thoughts.  Her appetite is okay.  Her vital signs are stable.  Visit Diagnosis:    ICD-10-CM   1. Major depressive disorder, recurrent episode, moderate (HCC) F33.1 ARIPiprazole (ABILIFY) 20 MG tablet    venlafaxine XR (EFFEXOR-XR) 75 MG 24 hr capsule    Past Psychiatric History: Reviewed. Patient seen in this office since 2009 [facility].  She was admitted due to severe depression and paranoid thinking.  She also took overdose her medication for a suicidal attempt.  [facility] due to severe depression and paranoid delusions.  In the past she has taken Prozac, Klonopin, Wellbutrin.  she took trazodone which help her insomnia but stopped when sleep improved.     Past Medical History:  Past Medical History:  Diagnosis Date  . Anemia    when she was younger  . Cancer (HCC) 2013   bilateral breast cancer  . Complication of anesthesia    ?, heart rate dropped w/wisdom teeth  . Depression   . Family history of breast cancer   . Family history of ovarian cancer   . Family history of pancreatic cancer   . GERD (gastroesophageal reflux disease)    with pregnancy only  . Grave's disease    diagnosed about 7 yrs ago but after a year she has been doing well  . Heart murmur    told off and on in past that she has had one  . High cholesterol   . Pneumonia   . Wears glasses     Past Surgical History:  Procedure Laterality Date  . BREAST FIBROADENOMA SURGERY  1987, 2000   bilateral  . BREAST RECONSTRUCTION  05/12/2011   Procedure: BREAST RECONSTRUCTION;  Surgeon: David Bowers, MD;  Location[facility];  Service: Plastics;  Laterality: Bilateral;  PLACEMENT OF BILATERAL TISSUE EXPANDERS WITH POSSIBLE USE OF HD FLEX  . BREAST RECONSTRUCTION    . CESAREAN SECTION     20" 01,cyst; 2002 birth  .  CHOLECYSTECTOMY    . MASTECTOMY Bilateral   . ORIF ANKLE FRACTURE Right 09/18/2013   Procedure: OPEN REDUCTION INTERNAL FIXATION (ORIF) RIGHT ANKLE FRACTURE;  Surgeon: Mcarthur Rossetti, MD;  Location: [facility];  Service: Orthopedics;  Laterality: Right;  . WISDOM TOOTH EXTRACTION      Family Psychiatric History: Reviewed.  Family History:  Family History  Problem Relation Age of Onset  . Heart disease Mother   . Stroke Mother   . COPD Mother   . Diabetes Mother   . Heart disease Father   . Lung cancer Father 35  . Colon polyps Father   . Breast cancer Maternal Grandmother 68  . Pancreatic cancer Maternal Grandmother        dx in her 66s  . Ovarian cancer  Paternal Aunt        dx in her 44s  . Breast cancer Other        MGM's sister; unilateral breast cancer  . Cancer Other        PGMs mother; either stomach or ovarian cancer    Social History:  Social History   Social History  . Marital status: Married    Spouse name: N/A  . Number of children: 3  . Years of education: N/A   Social History Main Topics  . Smoking status: Current Some Day Smoker    Packs/day: 0.20    Years: 6.00    Types: Cigarettes    Last attempt to quit: 10/01/2013  . Smokeless tobacco: Never Used     Comment: trying to quit.  . Alcohol use Yes     Comment: Occasional use with dinner  . Drug use: No  . Sexual activity: Not Currently    Birth control/ protection: Condom   Other Topics Concern  . None   Social History Narrative  . None    Allergies:  Allergies  Allergen Reactions  . Clindamycin/Lincomycin Anaphylaxis  . Lunesta [Eszopiclone] Other (See Comments)    Causes loss of memory, confusion  . Penicillins Other (See Comments)    Did not work as a child    Metabolic Disorder Labs: Lab Results  Component Value Date   HGBA1C 5.2 07/12/2016   MPG 105 10/04/2014   No results found for: PROLACTIN Lab Results  Component Value Date   CHOL 232 (H) 10/04/2014   TRIG 94 10/04/2014   HDL 45 10/04/2014   CHOLHDL 5.2 10/04/2014   VLDL 19 10/04/2014   LDLCALC 168 (H) 10/04/2014   Lab Results  Component Value Date   TSH 2.232 10/04/2014   TSH 2.498 10/15/2009    Therapeutic Level Labs: No results found for this or any previous visit (from the past 2160 hour(s)). No results found for: LITHIUM No results found for: VALPROATE No components found for:  CBMZ  Current Medications: Current Outpatient Prescriptions  Medication Sig Dispense Refill  . ARIPiprazole (ABILIFY) 20 MG tablet TAKE 1 TABLET BY MOUTH ATBEDTIME 90 tablet 0  . lamoTRIgine (LAMICTAL) 200 MG tablet Take 1 tablet (200 mg total) by mouth daily. 90 tablet 0  . terbinafine  (LAMISIL) 250 MG tablet Take 250 mg by mouth 1 day or 1 dose.    . venlafaxine XR (EFFEXOR-XR) 75 MG 24 hr capsule TAKE 1 CAPSULE BY MOUTH DAILY WITH BREAKFAST 90 capsule 0  . phentermine 37.5 MG capsule Take 37.5 mg by mouth every morning.     No current facility-administered medications for this visit.      Musculoskeletal: Strength &  Muscle Tone: within normal limits Gait & Station: normal Patient leans: N/A  Psychiatric Specialty Exam: ROS  Blood pressure 122/76, pulse (!) 103, height 5\' 5"  (1.651 m), weight 204 lb (92.5 kg).Body mass index is 33.95 kg/m.  General Appearance: Casual  Eye Contact:  Good  Speech:  Clear and Coherent  Volume:  Normal  Mood:  Euthymic  Affect:  Congruent  Thought Process:  Goal Directed  Orientation:  Full (Time, Place, and Person)  Thought Content: Logical   Suicidal Thoughts:  No  Homicidal Thoughts:  No  Memory:  Immediate;   Good Recent;   Good Remote;   Good  Judgement:  Good  Insight:  Good  Psychomotor Activity:  Normal  Concentration:  Concentration: Good and Attention Span: Good  Recall:  Good  Fund of Knowledge: Good  Language: Good  Akathisia:  No  Handed:  Right  AIMS (if indicated): not done  Assets:  Communication Skills Desire for Improvement Housing Resilience Social Support  ADL's:  Intact  Cognition: WNL  Sleep:  Good   Screenings: AIMS     Admission (Discharged) from 10/03/2014 in [facility]  AIMS Total Score  0    AUDIT     Admission (Discharged) from 10/03/2014 in [facility] Admission (Discharged) from 10/11/2012 in [facility]  Alcohol Use Disorder Identification Test Final Score (AUDIT)  11  3       Assessment and Plan: Major depressive disorder, recurrent  Patient doing better.  She still actively looking for a new job since her contract work with previous company is ended.  She is feeling better  with increase Lamictal.  I will continue Lamictal 200 mg daily, Effexor 75 mg daily and Abilify 20 mg daily.  I reviewed blood work results with her.  Discussed medication side effects and benefits.  Patient is not seeing therapist and does not feel she need to see at this time.  Patient has no rash, itching, tremors or shakes.  Recommended to call us back if she has any question or any concern.  Follow-up in 3 months.   Ademide Schaberg T., MD 11/03/2016, 11:05 AM

## 2017-02-02 ENCOUNTER — Ambulatory Visit (HOSPITAL_COMMUNITY): Payer: Self-pay | Admitting: Psychiatry

## 2017-02-23 ENCOUNTER — Other Ambulatory Visit (HOSPITAL_COMMUNITY): Payer: Self-pay

## 2017-02-23 DIAGNOSIS — K219 Gastro-esophageal reflux disease without esophagitis: Secondary | ICD-10-CM

## 2017-02-23 DIAGNOSIS — F331 Major depressive disorder, recurrent, moderate: Secondary | ICD-10-CM

## 2017-02-23 HISTORY — DX: Gastro-esophageal reflux disease without esophagitis: K21.9

## 2017-02-23 MED ORDER — LAMOTRIGINE 200 MG PO TABS: 200.0000 mg | ORAL_TABLET | Freq: Every day | ORAL | 0 refills | Status: DC

## 2017-02-23 MED ORDER — ARIPIPRAZOLE 20 MG PO TABS: ORAL_TABLET | ORAL | 0 refills | Status: DC

## 2017-02-23 MED ORDER — VENLAFAXINE HCL ER 75 MG PO CP24: ORAL_CAPSULE | ORAL | 0 refills | Status: DC

## 2017-03-23 ENCOUNTER — Ambulatory Visit (INDEPENDENT_AMBULATORY_CARE_PROVIDER_SITE_OTHER): Payer: PRIVATE HEALTH INSURANCE | Admitting: Psychiatry

## 2017-03-23 ENCOUNTER — Encounter (HOSPITAL_COMMUNITY): Payer: Self-pay | Admitting: Psychiatry

## 2017-03-23 DIAGNOSIS — F17210 Nicotine dependence, cigarettes, uncomplicated: Secondary | ICD-10-CM | POA: Diagnosis not present

## 2017-03-23 DIAGNOSIS — F1099 Alcohol use, unspecified with unspecified alcohol-induced disorder: Secondary | ICD-10-CM

## 2017-03-23 DIAGNOSIS — K219 Gastro-esophageal reflux disease without esophagitis: Secondary | ICD-10-CM | POA: Diagnosis not present

## 2017-03-23 DIAGNOSIS — F331 Major depressive disorder, recurrent, moderate: Secondary | ICD-10-CM | POA: Diagnosis not present

## 2017-03-23 DIAGNOSIS — Z630 Problems in relationship with spouse or partner: Secondary | ICD-10-CM

## 2017-03-23 DIAGNOSIS — Z79899 Other long term (current) drug therapy: Secondary | ICD-10-CM

## 2017-03-23 MED ORDER — ARIPIPRAZOLE 20 MG PO TABS: ORAL_TABLET | ORAL | 0 refills | Status: DC

## 2017-03-23 MED ORDER — VENLAFAXINE HCL ER 75 MG PO CP24: ORAL_CAPSULE | ORAL | 0 refills | Status: DC

## 2017-03-23 MED ORDER — LAMOTRIGINE 200 MG PO TABS: 200.0000 mg | ORAL_TABLET | Freq: Every day | ORAL | 0 refills | Status: DC

## 2017-03-23 NOTE — Progress Notes (Signed)
[place] MD/PA/NP OP Progress Note  03/23/2017 1:33 PM Donna Black  MRN:  [id number]  Chief Complaint: I am doing much better.  I started marriage counseling and things are going well.  HPI: Patient came for her follow-up appointment.  She is taking Lamictal, Effexor and Abilify.  She admitted medicine working especially Lamictal.  She denies any irritability, anger, mania, psychosis or any feeling of hopelessness or worthlessness.  She has no delusions or paranoia.  She recently diagnosed with GERD and given medication for that.  Since she diagnosed GERD she has significantly cut down her drinking because it causes worsening of heartburn.  She admitted weight gain in past few months because she has not doing exercise.  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  She is hoping once she has certification that she can apply for a job.  She started counseling with Donna Black however she is not living with him.  But she believe things are going very well.  She had a good Christmas.  She denies any rash, itching, tremors or shakes.  She denies any illegal substance use  Visit Diagnosis:    ICD-10-CM   1. Major depressive disorder, recurrent episode, moderate (HCC) F33.1 venlafaxine XR (EFFEXOR-XR) 75 MG 24 hr capsule    lamoTRIgine (LAMICTAL) 200 MG tablet    ARIPiprazole (ABILIFY) 20 MG tablet    Past Psychiatric History: Reviewed. Patient seen in this office since 2009 after she was released from behavioral [place] and completed intensive outpatient program. She was admitted due to severe depression and paranoid thinking. She also took overdose her medication for a suicidal attempt. She was again admitted to behavioral [place] in August 2016 due to severe depression and paranoid delusions. In the past she has taken Prozac, Klonopin, Wellbutrin. she took trazodone which help her insomnia but stopped when  sleep improved.   Past Medical History:  Past Medical History:  Diagnosis Date  . Anemia    when she was younger  . Cancer [facility]) 2013   bilateral breast cancer  . Complication of anesthesia    ?, heart rate dropped w/wisdom teeth  . Depression   . Family history of breast cancer   . Family history of ovarian cancer   . Family history of pancreatic cancer   . GERD (gastroesophageal reflux disease)    with pregnancy only  . Grave's disease    diagnosed about 7 yrs ago but after a year she has been doing well  . Heart murmur    told off and on in past that she has had one  . High cholesterol   . Pneumonia   . Wears glasses     Past Surgical History:  Procedure Laterality Date  . [place], 2000   bilateral  . BREAST RECONSTRUCTION  05/12/2011   Procedure: BREAST RECONSTRUCTION;  Surgeon: Crissie Reese, MD;  Location: [facility];  Service: Plastics;  Laterality: Bilateral;  PLACEMENT OF BILATERAL TISSUE EXPANDERS WITH POSSIBLE USE OF HD FLEX  . BREAST RECONSTRUCTION    . CESAREAN SECTION     2001,cyst; 2002 birth  . CHOLECYSTECTOMY    . MASTECTOMY Bilateral   . ORIF ANKLE FRACTURE Right 09/18/2013   Procedure: OPEN REDUCTION INTERNAL FIXATION (ORIF) RIGHT ANKLE FRACTURE;  Surgeon: Mcarthur Rossetti, MD;  Location: [facility];  Service: Orthopedics;  Laterality: Right;  .  WISDOM TOOTH EXTRACTION      Family Psychiatric History: Reviewed.  Family History:  Family History  Problem Relation Age of Onset  . Heart disease Mother   . Stroke Mother   . COPD Mother   . Diabetes Mother   . Heart disease Father   . Lung cancer Father 58  . Colon polyps Father   . Breast cancer Maternal Grandmother 28  . Pancreatic cancer Maternal Grandmother        dx in her 36s  . Ovarian cancer Paternal Aunt        dx in her 85s  . Breast cancer Other        MGM's sister; unilateral breast cancer  . Cancer Other        PGMs mother; either stomach or ovarian cancer     Social History:  Social History   Socioeconomic History  . Marital status: Married    Spouse name: Not on file  . Number of children: 3  . Years of education: Not on file  . Highest education level: Not on file  Social Needs  . Financial resource strain: Not on file  . Food insecurity - worry: Not on file  . Food insecurity - inability: Not on file  . Transportation needs - medical: Not on file  . Transportation needs - non-medical: Not on file  Occupational History  . Not on file  Tobacco Use  . Smoking status: Current Some Day Smoker    Packs/day: 0.20    Years: 6.00    Pack years: 1.20    Types: Cigarettes    Last attempt to quit: 10/01/2013    Years since quitting: 3.4  . Smokeless tobacco: Never Used  . Tobacco comment: trying to quit.  Substance and Sexual Activity  . Alcohol use: Yes    Comment: Occasional use with dinner  . Drug use: No  . Sexual activity: Not Currently    Birth control/protection: Condom  Other Topics Concern  . Not on file  Social History Narrative  . Not on file    Allergies:  Allergies  Allergen Reactions  . Clindamycin/Lincomycin Anaphylaxis  . Lunesta [Eszopiclone] Other (See Comments)    Causes loss of memory, confusion  . Penicillins Other (See Comments)    Did not work as a child    Metabolic Disorder Labs: Lab Results  Component Value Date   HGBA1C 5.2 07/12/2016   MPG 105 10/04/2014   No results found for: PROLACTIN Lab Results  Component Value Date   CHOL 232 (H) 10/04/2014   TRIG 94 10/04/2014   HDL 45 10/04/2014   CHOLHDL 5.2 10/04/2014   VLDL 19 10/04/2014   LDLCALC 168 (H) 10/04/2014   Lab Results  Component Value Date   TSH 2.232 10/04/2014   TSH 2.498 10/15/2009    Therapeutic Level Labs: No results found for: LITHIUM No results found for: VALPROATE No components found for:  CBMZ  Current Medications: Current Outpatient Medications  Medication Sig Dispense Refill  . ARIPiprazole (ABILIFY) 20  MG tablet TAKE 1 TABLET BY MOUTH ATBEDTIME 30 tablet 0  . lamoTRIgine (LAMICTAL) 200 MG tablet Take 1 tablet (200 mg total) by mouth daily. 30 tablet 0  . terbinafine (LAMISIL) 250 MG tablet Take 250 mg by mouth 1 day or 1 dose.    . venlafaxine XR (EFFEXOR-XR) 75 MG 24 hr capsule TAKE 1 CAPSULE BY MOUTH DAILY WITH BREAKFAST 30 capsule 0   No current facility-administered medications for this  visit.      Musculoskeletal: Strength & Muscle Tone: within normal limits Gait & Station: normal Patient leans: N/A  Psychiatric Specialty Exam: ROS  Blood pressure 132/82, pulse (!) 105, height 5\' 5"  (1.651 m), weight 213 lb (96.6 kg), SpO2 96 %.There is no height or weight on file to calculate BMI.  General Appearance: Casual  Eye Contact:  Good  Speech:  Clear and Coherent  Volume:  Normal  Mood:  Euthymic  Affect:  Appropriate  Thought Process:  Goal Directed  Orientation:  Full (Time, Place, and Person)  Thought Content: WDL   Suicidal Thoughts:  No  Homicidal Thoughts:  No  Memory:  Immediate;   Good Recent;   Good Remote;   Good  Judgement:  Good  Insight:  Good  Psychomotor Activity:  Normal  Concentration:  Concentration: Good and Attention Span: Good  Recall:  Good  Fund of Knowledge: Good  Language: Good  Akathisia:  No  Handed:  Right  AIMS (if indicated): not done  Assets:  Communication Skills Desire for Improvement Housing  ADL's:  Intact  Cognition: WNL  Sleep:  Good   Screenings: AIMS     Admission (Discharged) from 10/03/2014 in [facility]  AIMS Total Score  0    AUDIT     Admission (Discharged) from 10/03/2014 in [facility] Admission (Discharged) from 10/11/2012 in [facility]  Alcohol Use Disorder Identification Test Final Score (AUDIT)  11  3       Assessment and Plan: Major depressive disorder, recurrent.  Alcohol use.  Patient doing better on  her medication.  We discussed healthy lifestyle, watch her calorie intake and do regular exercise.  Also she has cut down her drinking from the past since diagnosed with GERD.  Continue Effexor 75 mg daily, Abilify 20 mg daily, Lamictal 200 mg daily.  She has no rash, itching, tremors or shakes.  Encourage counseling.  We will also get her consent so we can contact her physician for recent blood work results.  Recommended to call us back if she has any question, concern if she feels worsening of the symptoms.  Follow-up in 3 months.   [name], MD 03/23/2017, 1:33 PM

## 2017-03-30 ENCOUNTER — Other Ambulatory Visit: Payer: Self-pay | Admitting: Family Medicine

## 2017-03-30 ENCOUNTER — Ambulatory Visit
Admission: RE | Admit: 2017-03-30 | Discharge: 2017-03-30 | Disposition: A | Discharge status: Home / Self Care | Payer: PRIVATE HEALTH INSURANCE | Source: Ambulatory Visit | Attending: Family Medicine | Admitting: Family Medicine

## 2017-03-30 DIAGNOSIS — R053 Chronic cough: Secondary | ICD-10-CM

## 2017-03-30 DIAGNOSIS — R05 Cough: Secondary | ICD-10-CM

## 2017-04-25 ENCOUNTER — Encounter: Payer: Self-pay | Admitting: Emergency Medicine

## 2017-04-25 ENCOUNTER — Ambulatory Visit (INDEPENDENT_AMBULATORY_CARE_PROVIDER_SITE_OTHER): Payer: PRIVATE HEALTH INSURANCE | Admitting: Emergency Medicine

## 2017-04-25 VITALS — BP 130/82 | HR 103 | Ht 65.0 in | Wt 213.0 lb

## 2017-04-25 DIAGNOSIS — R05 Cough: Secondary | ICD-10-CM | POA: Diagnosis not present

## 2017-04-25 DIAGNOSIS — Z720 Tobacco use: Secondary | ICD-10-CM | POA: Diagnosis not present

## 2017-04-25 DIAGNOSIS — R053 Chronic cough: Secondary | ICD-10-CM | POA: Insufficient documentation

## 2017-04-25 DIAGNOSIS — J449 Chronic obstructive pulmonary disease, unspecified: Secondary | ICD-10-CM | POA: Insufficient documentation

## 2017-04-25 MED ORDER — ESOMEPRAZOLE MAGNESIUM 40 MG PO CPDR: DELAYED_RELEASE_CAPSULE | ORAL | 0 refills | Status: DC

## 2017-04-25 NOTE — Assessment & Plan Note (Signed)
Suspect that this is multifactorial.  Based on the nocturnal predilection I like to more aggressively treat GERD to see if she benefits.  She also has some allergic symptoms.  She has coughing when she works out, question whether this is bronchospasm.  We will try to treat GERD and allergic rhinitis more aggressively.  Talked about avoiding throat clearing.  She knows that she needs to stop smoking and this will be helpful.  We will assess for degree of COPD

## 2017-04-25 NOTE — Patient Instructions (Addendum)
Please start taking Nexium 40 mg twice a day for 2 weeks, then decrease to once a day.  Remember to take this medication about 1 hour before or after eating. Please start loratadine 10 mg once a day until our next visit If you continue to have cough after 2 weeks, please start fluticasone nasal spray 2 sprays each nostril once a day in addition to all the above. We will repeat your pulmonary function testing Continue to work hard on decreasing your smoking as you have planned. We will perform lab work today  Follow with Dr Lamonte Sakai in 1 month or next available to review your testing and your symptoms.

## 2017-04-25 NOTE — Assessment & Plan Note (Signed)
Discussed cessation in detail with her today.  In particular we talked about the effects of cigarette smoking on her lung function.  She has a plan to decrease her usage by one cigarette a day each week.  She is currently smoking 15 cigarettes a day.  Once she has cut down significantly we will talk about setting a quit date.  She may benefit from adjunct therapy at that time including supplemental nicotine, Wellbutrin, etc.

## 2017-04-25 NOTE — Assessment & Plan Note (Signed)
Mild obstruction on PFT from 2017.  We talked about the potential for progression, potential for resolution of symptoms.  We talked about smoking cessation.  I will repeat her pulmonary function testing and then we will discuss medications based on the degree of her obstruction.

## 2017-04-25 NOTE — Progress Notes (Signed)
Subjective:    Patient ID: Donna Black, female    DOB: 1965/08/22, 52 y.o.   MRN: [id number]  HPI  52 year old smoker (~20 pack years), with a history of breast cancer, hyperlipidemia, depression.  She is referred today by Dr. Maurice Small for evaluation of chronic cough.  She reports that she has been dealing with a dry cough for about 2 years, often worst at night. She had a URI in December 2018 > treated w steroids + abx x 2. Cough now about back to baseline, but now produces white mucous.  She has had GERD, has been treated with omeprazole qd but cough persisted. She is smoking about 3/4 pks a day. She has some SOB climbing stairs. Rare wheeze, can hear it at night. She has used SABA before but not reliably. Didn't really help her. She has nasal gtt and congestion, sometimes sneezes.   Pulmonary function testing was done 11/06/15 that I have reviewed.  This shows probable mild obstruction based on low FEV1 to FVC ratio, curve of her flow volume loop.  There was no significant dilator response.  Her lung volumes are borderline hyperinflated with a normal diffusion capacity.    Review of Systems  Constitutional: Negative for fever and unexpected weight change.  HENT: Positive for congestion. Negative for dental problem, ear pain, nosebleeds, postnasal drip, rhinorrhea, sinus pressure, sneezing, sore throat and trouble swallowing.   Eyes: Negative for redness and itching.  Respiratory: Positive for cough and shortness of breath. Negative for chest tightness and wheezing.   Cardiovascular: Negative for palpitations and leg swelling.  Gastrointestinal: Negative for nausea and vomiting.  Genitourinary: Negative for dysuria.  Musculoskeletal: Negative for joint swelling.  Skin: Negative for rash.  Neurological: Negative for headaches.  Hematological: Does not bruise/bleed easily.  Psychiatric/Behavioral: Negative for dysphoric mood. The patient is not nervous/anxious.    Past Medical History:   Diagnosis Date  . Anemia    when she was younger  . Cancer [facility]) 2013   bilateral breast cancer  . Complication of anesthesia    ?, heart rate dropped w/wisdom teeth  . Depression   . Family history of breast cancer   . Family history of ovarian cancer   . Family history of pancreatic cancer   . GERD (gastroesophageal reflux disease)    with pregnancy only  . GERD (gastroesophageal reflux disease) 02/23/2017  . Grave's disease    diagnosed about 7 yrs ago but after a year she has been doing well  . Heart murmur    told off and on in past that she has had one  . High cholesterol   . Pneumonia   . Wears glasses      Family History  Problem Relation Age of Onset  . Heart disease Mother   . Stroke Mother   . COPD Mother   . Diabetes Mother   . Heart disease Father   . Lung cancer Father 34  . Colon polyps Father   . Breast cancer Maternal Grandmother 5  . Pancreatic cancer Maternal Grandmother        dx in her 30s  . Ovarian cancer Paternal Aunt        dx in her 40s  . Breast cancer Other        MGM's sister; unilateral breast cancer  . Cancer Other        PGMs mother; either stomach or ovarian cancer     Social History   Socioeconomic History  .  Marital status: Married    Spouse name: Not on file  . Number of children: 3  . Years of education: Not on file  . Highest education level: Not on file  Social Needs  . Financial resource strain: Not on file  . Food insecurity - worry: Not on file  . Food insecurity - inability: Not on file  . Transportation needs - medical: Not on file  . Transportation needs - non-medical: Not on file  Occupational History  . Not on file  Tobacco Use  . Smoking status: Current Every Day Smoker    Packs/day: 0.75    Years: 6.00    Pack years: 4.50    Types: Cigarettes  . Smokeless tobacco: Never Used  . Tobacco comment: Has been tappering off 1 pill a day   Substance and Sexual Activity  . Alcohol use: Yes    Comment:  Occasional use with dinner  . Drug use: No  . Sexual activity: Not Currently    Birth control/protection: Condom  Other Topics Concern  . Not on file  Social History Narrative  . Not on file     Allergies  Allergen Reactions  . Clindamycin/Lincomycin Anaphylaxis  . Lunesta [Eszopiclone] Other (See Comments)    Causes loss of memory, confusion  . Penicillins Other (See Comments)    Did not work as a child     Outpatient Medications Prior to Visit  Medication Sig Dispense Refill  . ARIPiprazole (ABILIFY) 20 MG tablet TAKE 1 TABLET BY MOUTH ATBEDTIME 90 tablet 0  . lamoTRIgine (LAMICTAL) 200 MG tablet Take 1 tablet (200 mg total) by mouth daily. 90 tablet 0  . pravastatin (PRAVACHOL) 20 MG tablet Take 20 mg by mouth daily.    Marland Kitchen terbinafine (LAMISIL) 250 MG tablet Take 250 mg by mouth 1 day or 1 dose.    . venlafaxine XR (EFFEXOR-XR) 75 MG 24 hr capsule TAKE 1 CAPSULE BY MOUTH DAILY WITH BREAKFAST 90 capsule 0  . omeprazole (PRILOSEC) 20 MG capsule Take 20 mg by mouth daily.     No facility-administered medications prior to visit.         Objective:   Physical Exam Vitals:   04/25/17 1416 04/25/17 1417  BP:  130/82  Pulse:  (!) 103  SpO2:  96%  Weight: 213 lb (96.6 kg)   Height: 5\' 5"  (1.651 m)    Gen: Pleasant, well-nourished, in no distress,  normal affect  ENT: No lesions,  mouth clear, some posterior pharyngeal erythema without exudate  Neck: No JVD, no stridor  Lungs: No use of accessory muscles, clear without rales or rhonchi  Cardiovascular: RRR, heart sounds normal, no murmur or gallops, trace peripheral edema  Musculoskeletal: No deformities, no cyanosis or clubbing  Neuro: alert, non focal  Skin: Warm, no lesions or rashes      Assessment & Plan:  Tobacco abuse Discussed cessation in detail with her today.  In particular we talked about the effects of cigarette smoking on her lung function.  She has a plan to decrease her usage by one cigarette a  day each week.  She is currently smoking 15 cigarettes a day.  Once she has cut down significantly we will talk about setting a quit date.  She may benefit from adjunct therapy at that time including supplemental nicotine, Wellbutrin, etc.  Chronic cough Suspect that this is multifactorial.  Based on the nocturnal predilection I like to more aggressively treat GERD to see if she benefits.  She also has some allergic symptoms.  She has coughing when she works out, question whether this is bronchospasm.  We will try to treat GERD and allergic rhinitis more aggressively.  Talked about avoiding throat clearing.  She knows that she needs to stop smoking and this will be helpful.  We will assess for degree of COPD  COPD (chronic obstructive pulmonary disease) (HCC) Mild obstruction on PFT from 2017.  We talked about the potential for progression, potential for resolution of symptoms.  We talked about smoking cessation.  I will repeat her pulmonary function testing and then we will discuss medications based on the degree of her obstruction.  Baltazar Apo, MD, PhD 04/25/2017, 3:02 PM [facility] [phone] or if no answer [phone]

## 2017-05-11 ENCOUNTER — Ambulatory Visit: Payer: Self-pay | Admitting: Emergency Medicine

## 2017-05-24 ENCOUNTER — Telehealth (HOSPITAL_COMMUNITY): Payer: Self-pay

## 2017-05-24 ENCOUNTER — Other Ambulatory Visit (HOSPITAL_COMMUNITY): Payer: Self-pay | Admitting: Psychiatry

## 2017-05-24 NOTE — Telephone Encounter (Signed)
Patient is calling to see if you can increase the "depression medication", she states that she has been going through therapy with her husband and it is bringing up some painful memories. Patient feels that she needs an increase. Please review and advise, thank you

## 2017-05-24 NOTE — Telephone Encounter (Signed)
She can try Effexor 112.5 mg daily.  Watch for tremors and shakes.

## 2017-05-25 ENCOUNTER — Other Ambulatory Visit: Payer: Self-pay | Admitting: Emergency Medicine

## 2017-05-25 DIAGNOSIS — J449 Chronic obstructive pulmonary disease, unspecified: Secondary | ICD-10-CM

## 2017-05-26 ENCOUNTER — Ambulatory Visit (INDEPENDENT_AMBULATORY_CARE_PROVIDER_SITE_OTHER): Payer: PRIVATE HEALTH INSURANCE | Admitting: Emergency Medicine

## 2017-05-26 ENCOUNTER — Encounter: Payer: Self-pay | Admitting: Emergency Medicine

## 2017-05-26 DIAGNOSIS — J449 Chronic obstructive pulmonary disease, unspecified: Secondary | ICD-10-CM

## 2017-05-26 DIAGNOSIS — R05 Cough: Secondary | ICD-10-CM | POA: Diagnosis not present

## 2017-05-26 DIAGNOSIS — Z720 Tobacco use: Secondary | ICD-10-CM

## 2017-05-26 DIAGNOSIS — R053 Chronic cough: Secondary | ICD-10-CM

## 2017-05-26 DIAGNOSIS — F17210 Nicotine dependence, cigarettes, uncomplicated: Secondary | ICD-10-CM | POA: Diagnosis not present

## 2017-05-26 LAB — PULMONARY FUNCTION TEST
DL/VA % pred: 80 %
DL/VA: 3.94 ml/min/mmHg/L
DLCO unc % pred: 102 %
DLCO unc: 26.16 ml/min/mmHg
FEF 25-75 Post: 1.36 L/sec
FEF 25-75 Pre: 1.74 L/sec
FEF2575-%Change-Post: -22 %
FEF2575-%Pred-Post: 49 %
FEF2575-%Pred-Pre: 63 %
FEV1-%Change-Post: -4 %
FEV1-%Pred-Post: 77 %
FEV1-%Pred-Pre: 81 %
FEV1-Post: 2.22 L
FEV1-Pre: 2.32 L
FEV1FVC-%Change-Post: 5 %
FEV1FVC-%Pred-Pre: 91 %
FEV6-%Change-Post: -9 %
FEV6-%Pred-Post: 80 %
FEV6-%Pred-Pre: 89 %
FEV6-Post: 2.86 L
FEV6-Pre: 3.15 L
FEV6FVC-%Change-Post: 0 %
FEV6FVC-%Pred-Post: 103 %
FEV6FVC-%Pred-Pre: 102 %
FVC-%Change-Post: -9 %
FVC-%Pred-Post: 78 %
FVC-%Pred-Pre: 87 %
FVC-Post: 2.86 L
FVC-Pre: 3.16 L
Post FEV1/FVC ratio: 78 %
Post FEV6/FVC ratio: 100 %
Pre FEV1/FVC ratio: 74 %
Pre FEV6/FVC Ratio: 100 %
RV % pred: 144 %
RV: 2.7 L
TLC % pred: 123 %
TLC: 6.42 L

## 2017-05-26 MED ORDER — VENLAFAXINE HCL 37.5 MG PO TABS: 37.5000 mg | ORAL_TABLET | Freq: Every day | ORAL | 0 refills | Status: DC

## 2017-05-26 MED ORDER — ESOMEPRAZOLE MAGNESIUM 40 MG PO CPDR: 40.0000 mg | DELAYED_RELEASE_CAPSULE | Freq: Every day | ORAL | 3 refills | Status: DC

## 2017-05-26 NOTE — Assessment & Plan Note (Signed)
Discussed the importance of cessation and techniques to help her stop today.

## 2017-05-26 NOTE — Patient Instructions (Signed)
Please continue your Nexium 40 mg once a day. You can try stopping your allergy medication to see if you tolerate this.  If your cough returns then I would like for you to be back on an allergy pill.  We can use loratadine 10 mg (generic Claritin) once daily. Your pulmonary function testing is stable compared with 2017.  There is some very subtle evidence for your history of tobacco use but no significant COPD yet.  Now is the time for you to stop smoking to preserve your lung function! Follow with Dr Lamonte Sakai in 12 months or sooner if you have any problems

## 2017-05-26 NOTE — Assessment & Plan Note (Signed)
Suspect that GERD is her biggest contributor.  We started on allergy medication as well but she cannot remember which one.  I told her that she could try stopping the allergy medication, see if she tolerates.  I think she continue the Nexium for now.

## 2017-05-26 NOTE — Assessment & Plan Note (Signed)
Subtle evidence for obstruction on her pulmonary function testing.  No significant change compared with 2017.  I do not think she needs bronchodilators.  The most important thing we talked about with smoking cessation.

## 2017-05-26 NOTE — Progress Notes (Signed)
PFT completed today 05/26/17

## 2017-05-26 NOTE — Progress Notes (Signed)
Subjective:    Patient ID: Donna Black, female    DOB: 1965-11-12, 52 y.o.   MRN: [id number]  COPD  She complains of cough and shortness of breath. There is no wheezing. Pertinent negatives include no ear pain, fever, headaches, postnasal drip, rhinorrhea, sneezing, sore throat or trouble swallowing. Her past medical history is significant for COPD.    52 year old smoker (~20 pack years), with a history of breast cancer, hyperlipidemia, depression.  She is referred today by Dr. Maurice Small for evaluation of chronic cough.  She reports that she has been dealing with a dry cough for about 2 years, often worst at night. She had a URI in December 2018 > treated w steroids + abx x 2. Cough now about back to baseline, but now produces white mucous.  She has had GERD, has been treated with omeprazole qd but cough persisted. She is smoking about 3/4 pks a day. She has some SOB climbing stairs. Rare wheeze, can hear it at night. She has used SABA before but not reliably. Didn't really help her. She has nasal gtt and congestion, sometimes sneezes.   Pulmonary function testing was done 11/06/15 that I have reviewed.  This shows probable mild obstruction based on low FEV1 to FVC ratio, curve of her flow volume loop.  There was no significant dilator response.  Her lung volumes are borderline hyperinflated with a normal diffusion capacity.   ROV 05/24/17 --follow-up visit for history of tobacco use, breast cancer, hyperlipidemia, depression.  I saw her recently for chronic cough that felt was probably multifactorial with possible contributions of GERD, allergic rhinitis.  She underwent pulmonary function testing today and I have reviewed.  Her spirometry is grossly normal with some possible curve of her flow volume loop.  There is no bronchodilator response.  Her lung volumes are hyperinflated and her diffusion capacity is normal. She started nexium and an antihistamine > her cough is better.   Review of Systems    Constitutional: Negative for fever and unexpected weight change.  HENT: Positive for congestion. Negative for dental problem, ear pain, nosebleeds, postnasal drip, rhinorrhea, sinus pressure, sneezing, sore throat and trouble swallowing.   Eyes: Negative for redness and itching.  Respiratory: Positive for cough and shortness of breath. Negative for chest tightness and wheezing.   Cardiovascular: Negative for palpitations and leg swelling.  Gastrointestinal: Negative for nausea and vomiting.  Genitourinary: Negative for dysuria.  Musculoskeletal: Negative for joint swelling.  Skin: Negative for rash.  Neurological: Negative for headaches.  Hematological: Does not bruise/bleed easily.  Psychiatric/Behavioral: Negative for dysphoric mood. The patient is not nervous/anxious.    Past Medical History:  Diagnosis Date  . Anemia    when she was younger  . Cancer [facility]) 2013   bilateral breast cancer  . Complication of anesthesia    ?, heart rate dropped w/wisdom teeth  . Depression   . Family history of breast cancer   . Family history of ovarian cancer   . Family history of pancreatic cancer   . GERD (gastroesophageal reflux disease)    with pregnancy only  . GERD (gastroesophageal reflux disease) 02/23/2017  . Grave's disease    diagnosed about 7 yrs ago but after a year she has been doing well  . Heart murmur    told off and on in past that she has had one  . High cholesterol   . Pneumonia   . Wears glasses      Family History  Problem Relation  Age of Onset  . Heart disease Mother   . Stroke Mother   . COPD Mother   . Diabetes Mother   . Heart disease Father   . Lung cancer Father 81  . Colon polyps Father   . Breast cancer Maternal Grandmother 3  . Pancreatic cancer Maternal Grandmother        dx in her 22s  . Ovarian cancer Paternal Aunt        dx in her 76s  . Breast cancer Other        MGM's sister; unilateral breast cancer  . Cancer Other        PGMs mother;  either stomach or ovarian cancer     Social History   Socioeconomic History  . Marital status: Married    Spouse name: Not on file  . Number of children: 3  . Years of education: Not on file  . Highest education level: Not on file  Occupational History  . Not on file  Social Needs  . Financial resource strain: Not on file  . Food insecurity:    Worry: Not on file    Inability: Not on file  . Transportation needs:    Medical: Not on file    Non-medical: Not on file  Tobacco Use  . Smoking status: Current Every Day Smoker    Packs/day: 0.75    Years: 6.00    Pack years: 4.50    Types: Cigarettes  . Smokeless tobacco: Never Used  . Tobacco comment: Has been tappering off 1 pill a day   Substance and Sexual Activity  . Alcohol use: Yes    Comment: Occasional use with dinner  . Drug use: No  . Sexual activity: Not Currently    Birth control/protection: Condom  Lifestyle  . Physical activity:    Days per week: Not on file    Minutes per session: Not on file  . Stress: Not on file  Relationships  . Social connections:    Talks on phone: Not on file    Gets together: Not on file    Attends religious service: Not on file    Active member of club or organization: Not on file    Attends meetings of clubs or organizations: Not on file    Relationship status: Not on file  . Intimate partner violence:    Fear of current or ex partner: Not on file    Emotionally abused: Not on file    Physically abused: Not on file    Forced sexual activity: Not on file  Other Topics Concern  . Not on file  Social History Narrative  . Not on file     Allergies  Allergen Reactions  . Clindamycin/Lincomycin Anaphylaxis  . Lunesta [Eszopiclone] Other (See Comments)    Causes loss of memory, confusion  . Penicillins Other (See Comments)    Did not work as a child     Outpatient Medications Prior to Visit  Medication Sig Dispense Refill  . ARIPiprazole (ABILIFY) 20 MG tablet TAKE 1  TABLET BY MOUTH ATBEDTIME 90 tablet 0  . esomeprazole (NEXIUM) 40 MG capsule Take 2 capsules for 1 week, then decrease to 1 capsule daily 60 capsule 0  . lamoTRIgine (LAMICTAL) 200 MG tablet Take 1 tablet (200 mg total) by mouth daily. 90 tablet 0  . pravastatin (PRAVACHOL) 20 MG tablet Take 20 mg by mouth daily.    Marland Kitchen venlafaxine XR (EFFEXOR-XR) 75 MG 24 hr capsule TAKE 1  CAPSULE BY MOUTH DAILY WITH BREAKFAST 90 capsule 0  . terbinafine (LAMISIL) 250 MG tablet Take 250 mg by mouth 1 day or 1 dose.     No facility-administered medications prior to visit.         Objective:   Physical Exam Vitals:   05/26/17 0951  BP: (!) 134/92  Pulse: (!) 101  SpO2: 100%  Weight: 213 lb (96.6 kg)  Height: 5\' 5"  (1.651 m)   Gen: Pleasant, well-nourished, in no distress,  normal affect  ENT: No lesions,  mouth clear, some posterior pharyngeal erythema without exudate  Neck: No JVD, no stridor  Lungs: No use of accessory muscles, clear without rales or rhonchi  Cardiovascular: RRR, heart sounds normal, no murmur or gallops, trace peripheral edema  Musculoskeletal: No deformities, no cyanosis or clubbing  Neuro: alert, non focal  Skin: Warm, no lesions or rashes      Assessment & Plan:  Chronic cough Suspect that GERD is her biggest contributor.  We started on allergy medication as well but she cannot remember which one.  I told her that she could try stopping the allergy medication, see if she tolerates.  I think she continue the Nexium for now.  COPD (chronic obstructive pulmonary disease) (HCC) Subtle evidence for obstruction on her pulmonary function testing.  No significant change compared with 2017.  I do not think she needs bronchodilators.  The most important thing we talked about with smoking cessation.  Tobacco abuse Discussed the importance of cessation and techniques to help her stop today.  Baltazar Apo, MD, PhD 05/26/2017, 10:37 AM [facility] [phone] or if no answer [phone]

## 2017-05-28 NOTE — Telephone Encounter (Signed)
Sent order to the pharmacy.

## 2017-06-21 ENCOUNTER — Ambulatory Visit (HOSPITAL_COMMUNITY): Payer: PRIVATE HEALTH INSURANCE | Admitting: Psychiatry

## 2017-06-22 ENCOUNTER — Ambulatory Visit (HOSPITAL_COMMUNITY): Payer: PRIVATE HEALTH INSURANCE | Admitting: Psychiatry

## 2017-06-28 ENCOUNTER — Encounter (HOSPITAL_COMMUNITY): Payer: Self-pay | Admitting: Psychiatry

## 2017-06-28 ENCOUNTER — Ambulatory Visit (INDEPENDENT_AMBULATORY_CARE_PROVIDER_SITE_OTHER): Payer: PRIVATE HEALTH INSURANCE | Admitting: Psychiatry

## 2017-06-28 DIAGNOSIS — F331 Major depressive disorder, recurrent, moderate: Secondary | ICD-10-CM | POA: Diagnosis not present

## 2017-06-28 DIAGNOSIS — Z915 Personal history of self-harm: Secondary | ICD-10-CM | POA: Diagnosis not present

## 2017-06-28 DIAGNOSIS — R05 Cough: Secondary | ICD-10-CM

## 2017-06-28 DIAGNOSIS — F1099 Alcohol use, unspecified with unspecified alcohol-induced disorder: Secondary | ICD-10-CM | POA: Diagnosis not present

## 2017-06-28 DIAGNOSIS — F17210 Nicotine dependence, cigarettes, uncomplicated: Secondary | ICD-10-CM

## 2017-06-28 MED ORDER — VENLAFAXINE HCL ER 75 MG PO CP24: ORAL_CAPSULE | ORAL | 0 refills | Status: DC

## 2017-06-28 MED ORDER — ARIPIPRAZOLE 20 MG PO TABS: ORAL_TABLET | ORAL | 0 refills | Status: DC

## 2017-06-28 MED ORDER — LAMOTRIGINE 200 MG PO TABS: 200.0000 mg | ORAL_TABLET | Freq: Every day | ORAL | 0 refills | Status: DC

## 2017-06-28 NOTE — Progress Notes (Signed)
[facility] MD/PA/NP OP Progress Note  06/28/2017 8:19 AM Donna Black  MRN:  [id number]  Chief Complaint: I am doing better.  I did not increase my Effexor.  HPI: Patient came for her follow-up appointment.  She had called to increase her Effexor dose but did not try because she noticed improvement as the time goes.  She admitted that she was very stressed because of marriage counseling with the [name] now things are much better and they are even thinking to go back together in the future.  She continues to have chronic cough and recently seen pulmonologist.  She is trying to stop smoking and she cut down to 8 cigarettes a day.  She feels proud that she is not drinking alcohol every day but rarely when she goes out for dinner she drinks a glass of wine.  However she denies any intoxication, binge or any blackouts.  She is sleeping good.  She admitted weight gain and recently she started phentermine to lose weight.  Her energy level is good.  Patient denies any rash, itching, tremors or shakes.  She denies any crying spells or any feeling of hopelessness or worthlessness.  She like to continue her current psychiatric medication.  Visit Diagnosis:    ICD-10-CM   1. Major depressive disorder, recurrent episode, moderate (HCC) F33.1 venlafaxine XR (EFFEXOR-XR) 75 MG 24 hr capsule    ARIPiprazole (ABILIFY) 20 MG tablet    lamoTRIgine (LAMICTAL) 200 MG tablet    Past Psychiatric History: Reviewed with Patient seen in this office since 2009 after she was released from behavioral [place] and completed intensive outpatient program. She was admitted due to severe depression and paranoid thinking. She also took overdose her medication for a suicidal attempt. She was again admitted to behavioral [facility] in August 2016 due to severe depression and paranoid delusions. In the past she has taken Prozac, Klonopin, Wellbutrin. she took trazodone which help her insomnia but stopped when sleep improved.    Past Medical History:  Past Medical History:  Diagnosis Date  . Anemia    when she was younger  . Cancer [facility]) 2013   bilateral breast cancer  . Complication of anesthesia    ?, heart rate dropped w/wisdom teeth  . Depression   . Family history of breast cancer   . Family history of ovarian cancer   . Family history of pancreatic cancer   . GERD (gastroesophageal reflux disease)    with pregnancy only  . GERD (gastroesophageal reflux disease) 02/23/2017  . Grave's disease    diagnosed about 7 yrs ago but after a year she has been doing well  . Heart murmur    told off and on in past that she has had one  . High cholesterol   . Pneumonia   . Wears glasses     Past Surgical History:  Procedure Laterality Date  . [place], 2000   bilateral  . BREAST RECONSTRUCTION  05/12/2011   Procedure: BREAST RECONSTRUCTION;  Surgeon: Crissie Reese, MD;  Location: [place];  Service: Plastics;  Laterality: Bilateral;  PLACEMENT OF BILATERAL TISSUE EXPANDERS WITH POSSIBLE USE OF HD FLEX  . BREAST RECONSTRUCTION    . CESAREAN SECTION     2001,cyst; 2002 birth  . CHOLECYSTECTOMY    . MASTECTOMY Bilateral   . ORIF ANKLE FRACTURE Right 09/18/2013   Procedure: OPEN REDUCTION INTERNAL FIXATION (ORIF) RIGHT ANKLE FRACTURE;  Surgeon: Mcarthur Rossetti, MD;  Location: [place];  Service:  Orthopedics;  Laterality: Right;  . WISDOM TOOTH EXTRACTION      Family Psychiatric History: Reviewed.  Family History:  Family History  Problem Relation Age of Onset  . Heart disease Mother   . Stroke Mother   . COPD Mother   . Diabetes Mother   . Heart disease Father   . Lung cancer Father 72  . Colon polyps Father   . Breast cancer Maternal Grandmother 5  . Pancreatic cancer Maternal Grandmother        dx in her 59s  . Ovarian cancer Paternal Aunt        dx in her 85s  . Breast cancer Other        MGM's sister; unilateral breast cancer  . Cancer Other        PGMs mother;  either stomach or ovarian cancer    Social History:  Social History   Socioeconomic History  . Marital status: Married    Spouse name: Not on file  . Number of children: 3  . Years of education: Not on file  . Highest education level: Not on file  Occupational History  . Not on file  Social Needs  . Financial resource strain: Not on file  . Food insecurity:    Worry: Not on file    Inability: Not on file  . Transportation needs:    Medical: Not on file    Non-medical: Not on file  Tobacco Use  . Smoking status: Current Every Day Smoker    Packs/day: 0.75    Years: 6.00    Pack years: 4.50    Types: Cigarettes  . Smokeless tobacco: Never Used  . Tobacco comment: Has been tappering off 1 pill a day   Substance and Sexual Activity  . Alcohol use: Yes    Comment: Occasional use with dinner  . Drug use: No  . Sexual activity: Not Currently    Birth control/protection: Condom  Lifestyle  . Physical activity:    Days per week: Not on file    Minutes per session: Not on file  . Stress: Not on file  Relationships  . Social connections:    Talks on phone: Not on file    Gets together: Not on file    Attends religious service: Not on file    Active member of club or organization: Not on file    Attends meetings of clubs or organizations: Not on file    Relationship status: Not on file  Other Topics Concern  . Not on file  Social History Narrative  . Not on file    Allergies:  Allergies  Allergen Reactions  . Clindamycin/Lincomycin Anaphylaxis  . Lunesta [Eszopiclone] Other (See Comments)    Causes loss of memory, confusion  . Penicillins Other (See Comments)    Did not work as a child    Metabolic Disorder Labs: Lab Results  Component Value Date   HGBA1C 5.2 07/12/2016   MPG 105 10/04/2014   No results found for: PROLACTIN Lab Results  Component Value Date   CHOL 232 (H) 10/04/2014   TRIG 94 10/04/2014   HDL 45 10/04/2014   CHOLHDL 5.2 10/04/2014    VLDL 19 10/04/2014   LDLCALC 168 (H) 10/04/2014   Lab Results  Component Value Date   TSH 2.232 10/04/2014   TSH 2.498 10/15/2009    Therapeutic Level Labs: No results found for: LITHIUM No results found for: VALPROATE No components found for:  CBMZ  Current Medications:  Current Outpatient Medications  Medication Sig Dispense Refill  . ARIPiprazole (ABILIFY) 20 MG tablet TAKE 1 TABLET BY MOUTH ATBEDTIME 90 tablet 0  . lamoTRIgine (LAMICTAL) 200 MG tablet Take 1 tablet (200 mg total) by mouth daily. 90 tablet 0  . phentermine 37.5 MG capsule Take 37.5 mg by mouth every morning.    . pravastatin (PRAVACHOL) 20 MG tablet Take 20 mg by mouth daily.    Marland Kitchen venlafaxine XR (EFFEXOR-XR) 75 MG 24 hr capsule TAKE 1 CAPSULE BY MOUTH DAILY WITH BREAKFAST 90 capsule 0   No current facility-administered medications for this visit.      Musculoskeletal: Strength & Muscle Tone: within normal limits Gait & Station: normal Patient leans: N/A  Psychiatric Specialty Exam: Review of Systems  Constitutional:       Weight gain  Respiratory: Positive for cough.   Skin: Negative.  Negative for itching and rash.    Blood pressure 126/80, pulse (!) 116, height 5\' 5"  (1.651 m), weight 220 lb (99.8 kg).Body mass index is 36.61 kg/m.  General Appearance: Casual  Eye Contact:  Good  Speech:  Clear and Coherent  Volume:  Normal  Mood:  Euthymic  Affect:  Appropriate  Thought Process:  Goal Directed  Orientation:  Full (Time, Place, and Person)  Thought Content: Logical   Suicidal Thoughts:  No  Homicidal Thoughts:  No  Memory:  Immediate;   Good Recent;   Good Remote;   Good  Judgement:  Good  Insight:  Good  Psychomotor Activity:  Normal  Concentration:  Concentration: Good and Attention Span: Good  Recall:  Good  Fund of Knowledge: Good  Language: Good  Akathisia:  No  Handed:  Right  AIMS (if indicated): not done  Assets:  Communication Skills Desire for  Improvement Housing Resilience Social Support  ADL's:  Intact  Cognition: WNL  Sleep:  Good   Screenings: AIMS     Admission (Discharged) from 10/03/2014 in [facility]  AIMS Total Score  0    AUDIT     Admission (Discharged) from 10/03/2014 in [place] Admission (Discharged) from 10/11/2012 in [facility] [facility]  Alcohol Use Disorder Identification Test Final Score (AUDIT)  11  3       Assessment and Plan: Major depressive disorder, recurrent.  Alcohol use.  Patient did not go up on Effexor and continued on Effexor XR 75 mg daily.  She is doing much better.  She is hoping that marriage counseling may work out in the future.  She has no rash, itching or tremors.  She is seeing pulmonologist and recently she has a breathing test to rule out COPD.  We discussed weight gain and she promised to watch her calorie intake and do regular exercise.  She is also taking phentermine.  Recommended to call us back if she has any question, concern if she feels worsening of the symptoms.  Follow-up in 3 months.   [name], MD 06/28/2017, 8:19 AM

## 2017-09-23 ENCOUNTER — Other Ambulatory Visit (HOSPITAL_COMMUNITY): Payer: Self-pay

## 2017-09-23 ENCOUNTER — Telehealth (HOSPITAL_COMMUNITY): Payer: Self-pay

## 2017-09-23 DIAGNOSIS — F331 Major depressive disorder, recurrent, moderate: Secondary | ICD-10-CM

## 2017-09-23 MED ORDER — ARIPIPRAZOLE 20 MG PO TABS: ORAL_TABLET | ORAL | 0 refills | Status: DC

## 2017-09-23 NOTE — Telephone Encounter (Signed)
Sent 90 day supply to pharmacy called patient and left voicemail

## 2017-09-23 NOTE — Telephone Encounter (Signed)
Patients appointment had to be rescheduled until Oct 8 to see Dr. Adele Schilder.  Patient needs a refill on Abilify 20mg . Please advise

## 2017-09-23 NOTE — Telephone Encounter (Signed)
Okay to send 90 days refill of the Abilify

## 2017-09-28 ENCOUNTER — Ambulatory Visit (HOSPITAL_COMMUNITY): Payer: Self-pay | Admitting: Psychiatry

## 2017-11-29 ENCOUNTER — Ambulatory Visit (HOSPITAL_COMMUNITY): Payer: Self-pay | Admitting: Psychiatry

## 2017-12-08 ENCOUNTER — Telehealth: Payer: Self-pay | Admitting: Emergency Medicine

## 2017-12-08 ENCOUNTER — Other Ambulatory Visit (HOSPITAL_COMMUNITY): Payer: Self-pay

## 2017-12-08 DIAGNOSIS — F331 Major depressive disorder, recurrent, moderate: Secondary | ICD-10-CM

## 2017-12-08 MED ORDER — ARIPIPRAZOLE 20 MG PO TABS: ORAL_TABLET | ORAL | 0 refills | Status: DC

## 2017-12-08 MED ORDER — LAMOTRIGINE 200 MG PO TABS: 200.0000 mg | ORAL_TABLET | Freq: Every day | ORAL | 0 refills | Status: DC

## 2017-12-08 MED ORDER — VENLAFAXINE HCL ER 75 MG PO CP24: ORAL_CAPSULE | ORAL | 0 refills | Status: DC

## 2017-12-08 MED ORDER — ESOMEPRAZOLE MAGNESIUM 40 MG PO CPDR: 40.0000 mg | DELAYED_RELEASE_CAPSULE | Freq: Every day | ORAL | 0 refills | Status: AC

## 2017-12-08 NOTE — Telephone Encounter (Signed)
Attempted to call pt but no answer. Left message for pt to return call. 

## 2017-12-08 NOTE — Telephone Encounter (Signed)
Called and spoke with pt and stated to her that I was not seeing a Rx of nexium on her list of current meds. I looked at pt's last OV and saw where RB stated for her to continue taking nexium 40mg  daily.  Looked at pt's med history and saw that the nexium was discontinued on error 06/28/17 by a Dennie Maizes, CMA  Stated to pt after she runs out of her current Rx of the 90tabs with the 3 RF that was sent in by RB, we would need to reorder the Rx for her. Pt expressed understanding.  I stated to pt I would go ahead and send an Rx of nexium 40mg  for her to have 3 days worth while she is out of town.  Verified the pharmacy that it needed to go to and sent the Rx in.  Nothing further needed.

## 2017-12-08 NOTE — Telephone Encounter (Signed)
Pt is returning call. Cb is [phone]

## 2017-12-08 NOTE — Telephone Encounter (Signed)
Patient returned call.  CB [id number].

## 2017-12-08 NOTE — Telephone Encounter (Signed)
Last ov rec was to take nexium  Nexium nor prilosec on current list  LMTCB

## 2017-12-26 ENCOUNTER — Other Ambulatory Visit (HOSPITAL_COMMUNITY): Payer: Self-pay | Admitting: Psychiatry

## 2017-12-26 DIAGNOSIS — F331 Major depressive disorder, recurrent, moderate: Secondary | ICD-10-CM

## 2018-01-02 ENCOUNTER — Other Ambulatory Visit (HOSPITAL_COMMUNITY): Payer: Self-pay

## 2018-01-02 DIAGNOSIS — F331 Major depressive disorder, recurrent, moderate: Secondary | ICD-10-CM

## 2018-01-02 MED ORDER — VENLAFAXINE HCL ER 75 MG PO CP24: ORAL_CAPSULE | ORAL | 0 refills | Status: DC

## 2018-01-02 MED ORDER — ARIPIPRAZOLE 20 MG PO TABS: ORAL_TABLET | ORAL | 0 refills | Status: DC

## 2018-01-02 MED ORDER — LAMOTRIGINE 200 MG PO TABS: 200.0000 mg | ORAL_TABLET | Freq: Every day | ORAL | 0 refills | Status: DC

## 2018-02-09 ENCOUNTER — Ambulatory Visit (HOSPITAL_COMMUNITY): Payer: PRIVATE HEALTH INSURANCE | Admitting: Psychiatry

## 2018-03-14 ENCOUNTER — Other Ambulatory Visit (HOSPITAL_COMMUNITY): Payer: Self-pay | Admitting: Psychiatry

## 2018-03-14 DIAGNOSIS — F331 Major depressive disorder, recurrent, moderate: Secondary | ICD-10-CM

## 2018-03-20 ENCOUNTER — Telehealth (HOSPITAL_COMMUNITY): Payer: Self-pay

## 2018-03-20 NOTE — Telephone Encounter (Signed)
Patient called and left a voicemail saying that she needs a refill on her medications. Patient hasn't been seen since 06-28-17. Next appointment is 05-31-18

## 2018-03-20 NOTE — Telephone Encounter (Signed)
Please provide 30-day supply.  She need to be seen within 30 days to continue her medication.

## 2018-03-21 ENCOUNTER — Ambulatory Visit (HOSPITAL_COMMUNITY): Payer: Self-pay | Admitting: Psychiatry

## 2018-03-21 ENCOUNTER — Other Ambulatory Visit (HOSPITAL_COMMUNITY): Payer: Self-pay

## 2018-03-21 DIAGNOSIS — F331 Major depressive disorder, recurrent, moderate: Secondary | ICD-10-CM

## 2018-03-21 MED ORDER — VENLAFAXINE HCL ER 75 MG PO CP24: ORAL_CAPSULE | ORAL | 0 refills | Status: DC

## 2018-03-21 MED ORDER — LAMOTRIGINE 200 MG PO TABS: 200.0000 mg | ORAL_TABLET | Freq: Every day | ORAL | 0 refills | Status: DC

## 2018-03-21 MED ORDER — ARIPIPRAZOLE 20 MG PO TABS: ORAL_TABLET | ORAL | 0 refills | Status: DC

## 2018-03-21 NOTE — Telephone Encounter (Signed)
An appointment has been scheduled for the patient on 03-21-18. 30 day supply has been sent to the pharmacy

## 2018-03-21 NOTE — Progress Notes (Signed)
BH MD/PA/NP OP Progress Note  03/21/2018 3:27 PM Donna Black  MRN:  [id number]  Chief Complaint: I been out of my Effexor for past few days.  I am emotional but otherwise things are going well.  HPI: Donna Black came for her follow-up appointment.  She was last seen in June.  She is doing very well on her medication however recently she ran out a few days ago and admitted having some withdrawal symptoms.  She appears emotional and easily tearful but denies any suicidal thoughts.  She feels proud that she is doing very well on the medicine and able to keep the job.  She is working in [place].  She had excellent relationship with her twin daughters.  Her son is now Counsellor.  She does frequent visits to see him.  She has no desire to go back to [place].  She realized that it will not work and since then she is more relaxed.  She also stopped drinking completely since last June.  Her energy level is good.  She is sleeping good.  She denies any paranoia, hallucination, suicidal thoughts or homicidal thought.  She is very excited today as she find out recently that she is going to be a grandmother in September.  Her daughter [name] is pregnant but she does not know whether it will be a boy or girl.  She like to continue her medication which is working very well for her.  Visit Diagnosis:    ICD-10-CM   1. Major depressive disorder, recurrent episode, moderate (HCC) F33.1 venlafaxine XR (EFFEXOR-XR) 75 MG 24 hr capsule    lamoTRIgine (LAMICTAL) 200 MG tablet    ARIPiprazole (ABILIFY) 20 MG tablet    Past Psychiatric History: Reviewed. Patient seen in this office since 2009 after she was released from behavioral [facility] and completed intensive outpatient program. She was admitted due to severe depression, paranoid and took overdose. She was again admitted to behavioral [facility] in August 2016 due to severe depression and paranoid delusions. In the past she has taken Prozac,  Klonopin, Wellbutrin. she took trazodone which help her insomnia but stopped when sleep improved.   Past Medical History:  Past Medical History:  Diagnosis Date  . Anemia    when she was younger  . Cancer [facility]) 2013   bilateral breast cancer  . Complication of anesthesia    ?, heart rate dropped w/wisdom teeth  . Depression   . Family history of breast cancer   . Family history of ovarian cancer   . Family history of pancreatic cancer   . GERD (gastroesophageal reflux disease)    with pregnancy only  . GERD (gastroesophageal reflux disease) 02/23/2017  . Grave's disease    diagnosed about 7 yrs ago but after a year she has been doing well  . Heart murmur    told off and on in past that she has had one  . High cholesterol   . Pneumonia   . Wears glasses     Past Surgical History:  Procedure Laterality Date  . [place], 2000   bilateral  . BREAST RECONSTRUCTION  05/12/2011   Procedure: BREAST RECONSTRUCTION;  Surgeon: Crissie Reese, MD;  Location: [place];  Service: Plastics;  Laterality: Bilateral;  PLACEMENT OF BILATERAL TISSUE EXPANDERS WITH POSSIBLE USE OF HD FLEX  . BREAST RECONSTRUCTION    . CESAREAN SECTION     2001,cyst; 2002 birth  . CHOLECYSTECTOMY    . MASTECTOMY Bilateral   .  ORIF ANKLE FRACTURE Right 09/18/2013   Procedure: OPEN REDUCTION INTERNAL FIXATION (ORIF) RIGHT ANKLE FRACTURE;  Surgeon: Mcarthur Rossetti, MD;  Location: [place];  Service: Orthopedics;  Laterality: Right;  . WISDOM TOOTH EXTRACTION      Family Psychiatric History: Reviewed.  Family History:  Family History  Problem Relation Age of Onset  . Heart disease Mother   . Stroke Mother   . COPD Mother   . Diabetes Mother   . Heart disease Father   . Lung cancer Father 35  . Colon polyps Father   . Breast cancer Maternal Grandmother 33  . Pancreatic cancer Maternal Grandmother        dx in her 86s  . Ovarian cancer Paternal Aunt        dx in her 21s  .  Breast cancer Other        MGM's sister; unilateral breast cancer  . Cancer Other        PGMs mother; either stomach or ovarian cancer    Social History:  Social History   Socioeconomic History  . Marital status: Married    Spouse name: Not on file  . Number of children: 3  . Years of education: Not on file  . Highest education level: Not on file  Occupational History  . Not on file  Social Needs  . Financial resource strain: Not on file  . Food insecurity:    Worry: Not on file    Inability: Not on file  . Transportation needs:    Medical: Not on file    Non-medical: Not on file  Tobacco Use  . Smoking status: Current Every Day Smoker    Packs/day: 0.75    Years: 6.00    Pack years: 4.50    Types: Cigarettes  . Smokeless tobacco: Never Used  . Tobacco comment: Has been tappering off 1 pill a day   Substance and Sexual Activity  . Alcohol use: Yes    Comment: Occasional use with dinner  . Drug use: No  . Sexual activity: Not Currently    Birth control/protection: Condom  Lifestyle  . Physical activity:    Days per week: Not on file    Minutes per session: Not on file  . Stress: Not on file  Relationships  . Social connections:    Talks on phone: Not on file    Gets together: Not on file    Attends religious service: Not on file    Active member of club or organization: Not on file    Attends meetings of clubs or organizations: Not on file    Relationship status: Not on file  Other Topics Concern  . Not on file  Social History Narrative  . Not on file    Allergies:  Allergies  Allergen Reactions  . Clindamycin/Lincomycin Anaphylaxis  . Lunesta [Eszopiclone] Other (See Comments)    Causes loss of memory, confusion  . Penicillins Other (See Comments)    Did not work as a child    Metabolic Disorder Labs: Lab Results  Component Value Date   HGBA1C 5.2 07/12/2016   MPG 105 10/04/2014   No results found for: PROLACTIN Lab Results  Component Value  Date   CHOL 232 (H) 10/04/2014   TRIG 94 10/04/2014   HDL 45 10/04/2014   CHOLHDL 5.2 10/04/2014   VLDL 19 10/04/2014   LDLCALC 168 (H) 10/04/2014   Lab Results  Component Value Date   TSH 2.232 10/04/2014  TSH 2.498 10/15/2009    Therapeutic Level Labs: No results found for: LITHIUM No results found for: VALPROATE No components found for:  CBMZ  Current Medications: Current Outpatient Medications  Medication Sig Dispense Refill  . ARIPiprazole (ABILIFY) 20 MG tablet TAKE 1 TABLET BY MOUTH ATBEDTIME 30 tablet 0  . esomeprazole (NEXIUM) 40 MG capsule Take 1 capsule (40 mg total) by mouth daily at 12 noon. 3 capsule 0  . lamoTRIgine (LAMICTAL) 200 MG tablet Take 1 tablet (200 mg total) by mouth daily. 30 tablet 0  . phentermine 37.5 MG capsule Take 37.5 mg by mouth every morning.    . pravastatin (PRAVACHOL) 20 MG tablet Take 20 mg by mouth daily.    Marland Kitchen venlafaxine XR (EFFEXOR-XR) 75 MG 24 hr capsule TAKE 1 CAPSULE BY MOUTH DAILY WITH BREAKFAST 30 capsule 0   No current facility-administered medications for this visit.      Musculoskeletal: Strength & Muscle Tone: within normal limits Gait & Station: normal Patient leans: N/A  Psychiatric Specialty Exam: ROS  Blood pressure 126/70, height 5\' 5"  (1.651 m), weight 196 lb (88.9 kg).There is no height or weight on file to calculate BMI.  General Appearance: Casual  Eye Contact:  Good  Speech:  Clear and Coherent  Volume:  Normal  Mood:  Euthymic  Affect:  Appropriate  Thought Process:  Goal Directed  Orientation:  Full (Time, Place, and Person)  Thought Content: Logical   Suicidal Thoughts:  No  Homicidal Thoughts:  No  Memory:  Immediate;   Good Recent;   Good Remote;   Good  Judgement:  Good  Insight:  Good  Psychomotor Activity:  Normal  Concentration:  Concentration: Good and Attention Span: Good  Recall:  Good  Fund of Knowledge: Good  Language: Good  Akathisia:  No  Handed:  Right  AIMS (if indicated):  not done  Assets:  Communication Skills Desire for [place] Talents/Skills  ADL's:  Intact  Cognition: WNL  Sleep:  Good   Screenings: AIMS     Admission (Discharged) from 10/03/2014 in [facility]  AIMS Total Score  0    AUDIT     Admission (Discharged) from 10/03/2014 in [facility] [facility] Admission (Discharged) from 10/11/2012 in [facility] [facility]  Alcohol Use Disorder Identification Test Final Score (AUDIT)  11  3       Assessment and Plan: Major depressive disorder, recurrent.  Patient is a stable on her medication.  Discussed to keep her appointments and take the medication as prescribed.  If it is about to run out then she should call as withdrawal from Effexor can be intense.  Patient agreed with the plan.  We will continue Effexor XR 75 mg daily, Lamictal 200 mg daily and Abilify 20 mg daily.  She has no rash, tremors, itching or shakes.  She is still taking phentermine but does not take on a regular basis.  I will see her again in 3 months.   [name], MD 03/21/2018, 3:27 PM

## 2018-05-07 ENCOUNTER — Encounter (HOSPITAL_COMMUNITY): Payer: Self-pay

## 2018-05-07 ENCOUNTER — Other Ambulatory Visit: Payer: Self-pay

## 2018-05-07 ENCOUNTER — Emergency Department (HOSPITAL_COMMUNITY)
Admission: EM | Admit: 2018-05-07 | Discharge: 2018-05-08 | Disposition: A | Discharge status: Other Acute Inpatient Hospital | Payer: Federal, State, Local not specified - Other | Attending: Emergency Medicine | Admitting: Emergency Medicine

## 2018-05-07 DIAGNOSIS — J449 Chronic obstructive pulmonary disease, unspecified: Secondary | ICD-10-CM | POA: Insufficient documentation

## 2018-05-07 DIAGNOSIS — Z79899 Other long term (current) drug therapy: Secondary | ICD-10-CM | POA: Insufficient documentation

## 2018-05-07 DIAGNOSIS — F4325 Adjustment disorder with mixed disturbance of emotions and conduct: Secondary | ICD-10-CM | POA: Insufficient documentation

## 2018-05-07 DIAGNOSIS — Z046 Encounter for general psychiatric examination, requested by authority: Secondary | ICD-10-CM | POA: Insufficient documentation

## 2018-05-07 DIAGNOSIS — Z853 Personal history of malignant neoplasm of breast: Secondary | ICD-10-CM | POA: Insufficient documentation

## 2018-05-07 DIAGNOSIS — Z008 Encounter for other general examination: Secondary | ICD-10-CM

## 2018-05-07 DIAGNOSIS — F332 Major depressive disorder, recurrent severe without psychotic features: Secondary | ICD-10-CM | POA: Diagnosis present

## 2018-05-07 DIAGNOSIS — Z720 Tobacco use: Secondary | ICD-10-CM

## 2018-05-07 DIAGNOSIS — F22 Delusional disorders: Secondary | ICD-10-CM

## 2018-05-07 DIAGNOSIS — F333 Major depressive disorder, recurrent, severe with psychotic symptoms: Secondary | ICD-10-CM | POA: Insufficient documentation

## 2018-05-07 DIAGNOSIS — F17210 Nicotine dependence, cigarettes, uncomplicated: Secondary | ICD-10-CM | POA: Insufficient documentation

## 2018-05-07 LAB — CBC WITH DIFFERENTIAL/PLATELET
Abs Immature Granulocytes: 0.03 10*3/uL (ref 0.00–0.07)
Basophils Absolute: 0.1 10*3/uL (ref 0.0–0.1)
Basophils Relative: 1 %
Eosinophils Absolute: 0.4 10*3/uL (ref 0.0–0.5)
Eosinophils Relative: 6 %
HCT: 42.7 % (ref 36.0–46.0)
Hemoglobin: 14 g/dL (ref 12.0–15.0)
IMMATURE GRANULOCYTES: 0 %
Lymphocytes Relative: 27 %
Lymphs Abs: 1.9 10*3/uL (ref 0.7–4.0)
MCH: 32.5 pg (ref 26.0–34.0)
MCHC: 32.8 g/dL (ref 30.0–36.0)
MCV: 99.1 fL (ref 80.0–100.0)
Monocytes Absolute: 0.8 10*3/uL (ref 0.1–1.0)
Monocytes Relative: 11 %
Neutro Abs: 3.9 10*3/uL (ref 1.7–7.7)
Neutrophils Relative %: 55 %
Platelets: 292 10*3/uL (ref 150–400)
RBC: 4.31 MIL/uL (ref 3.87–5.11)
RDW: 13.2 % (ref 11.5–15.5)
WBC: 7.1 10*3/uL (ref 4.0–10.5)
nRBC: 0 % (ref 0.0–0.2)

## 2018-05-07 LAB — COMPREHENSIVE METABOLIC PANEL
ALT: 27 U/L (ref 0–44)
AST: 22 U/L (ref 15–41)
Albumin: 3.8 g/dL (ref 3.5–5.0)
Alkaline Phosphatase: 61 U/L (ref 38–126)
Anion gap: 12 (ref 5–15)
BUN: 14 mg/dL (ref 6–20)
CO2: 21 mmol/L — ABNORMAL LOW (ref 22–32)
Calcium: 8.8 mg/dL — ABNORMAL LOW (ref 8.9–10.3)
Chloride: 108 mmol/L (ref 98–111)
Creatinine, Ser: 0.79 mg/dL (ref 0.44–1.00)
GFR calc Af Amer: >60 mL/min (ref >60)
Glucose, Bld: 98 mg/dL (ref 70–99)
Potassium: 3.6 mmol/L (ref 3.5–5.1)
Sodium: 141 mmol/L (ref 135–145)
Total Bilirubin: 0.3 mg/dL (ref 0.3–1.2)
Total Protein: 7.4 g/dL (ref 6.5–8.1)

## 2018-05-07 LAB — I-STAT BETA HCG BLOOD, ED (MC, WL, AP ONLY): I-stat hCG, quantitative: <5 m[IU]/mL (ref <5)

## 2018-05-07 LAB — ACETAMINOPHEN LEVEL: Acetaminophen (Tylenol), Serum: <10 ug/mL — ABNORMAL LOW (ref 10–30)

## 2018-05-07 LAB — SALICYLATE LEVEL: Salicylate Lvl: <7 mg/dL (ref 2.8–30.0)

## 2018-05-07 LAB — ETHANOL: Alcohol, Ethyl (B): 105 mg/dL — ABNORMAL HIGH (ref <10)

## 2018-05-07 MED ORDER — VENLAFAXINE HCL ER 75 MG PO CP24
75.0000 mg | ORAL_CAPSULE | Freq: Every day | ORAL | Status: DC
  Administered 2018-05-08: 75 mg via ORAL
  Filled 2018-05-07: qty 1

## 2018-05-07 MED ORDER — ACETAMINOPHEN 325 MG PO TABS: 650.0000 mg | ORAL_TABLET | ORAL | Status: DC | PRN

## 2018-05-07 MED ORDER — PANTOPRAZOLE SODIUM 40 MG PO TBEC
40.0000 mg | DELAYED_RELEASE_TABLET | Freq: Every day | ORAL | Status: DC
  Administered 2018-05-07 – 2018-05-08 (×2): 40 mg via ORAL
  Filled 2018-05-07 (×2): qty 1

## 2018-05-07 MED ORDER — NICOTINE 21 MG/24HR TD PT24: 21.0000 mg | MEDICATED_PATCH | Freq: Every day | TRANSDERMAL | Status: DC

## 2018-05-07 MED ORDER — PRAVASTATIN SODIUM 20 MG PO TABS
20.0000 mg | ORAL_TABLET | Freq: Every day | ORAL | Status: DC
  Administered 2018-05-07 – 2018-05-08 (×2): 20 mg via ORAL
  Filled 2018-05-07 (×2): qty 1

## 2018-05-07 MED ORDER — LAMOTRIGINE 100 MG PO TABS
200.0000 mg | ORAL_TABLET | Freq: Every day | ORAL | Status: DC
  Administered 2018-05-07 – 2018-05-08 (×2): 200 mg via ORAL
  Filled 2018-05-07 (×2): qty 2

## 2018-05-07 MED ORDER — ONDANSETRON HCL 4 MG PO TABS: 4.0000 mg | ORAL_TABLET | Freq: Three times a day (TID) | ORAL | Status: DC | PRN

## 2018-05-07 MED ORDER — ALUM & MAG HYDROXIDE-SIMETH 200-200-20 MG/5ML PO SUSP: 30.0000 mL | Freq: Four times a day (QID) | ORAL | Status: DC | PRN

## 2018-05-07 MED ORDER — ARIPIPRAZOLE 10 MG PO TABS
20.0000 mg | ORAL_TABLET | Freq: Every day | ORAL | Status: DC
  Administered 2018-05-07: 20 mg via ORAL
  Filled 2018-05-07: qty 2

## 2018-05-07 MED ORDER — ZOLPIDEM TARTRATE 5 MG PO TABS: 5.0000 mg | ORAL_TABLET | Freq: Every evening | ORAL | Status: DC | PRN

## 2018-05-07 NOTE — BH Assessment (Addendum)
Assessment Note  Donna Black is an 53 y.o. female, who presents involuntary and unaccompanied to [facility]. Clinician asked the pt, "what brought you to the hospital?" Pt reported, her cousins wife (Mrs. Iran Sizer) came over and they had a disagreement, the pt does not want to have a relationship with Mrs. Barrett Henle and told her to get out of her house. Pt reported, the police came to her house and brought her to the hospital. Pt reported, two suicide attempts in the past. Pt reported, her most recent attempt was in 2014, where she overdosed on Seroquel, Tylenol and some wine. Per chart pt was admitted to the ICU to stabilize, then sent to a step down unit. Pt denies,  Current SI, HI, AVH, self-injurious behaviors and access to weapons.  Pt was IVC'd by Fay Records, friend. Per IVC paperwork: "States she thinks about suicide but probably wont do anything. States the people on TV are watching her and talking able her. Hostile and has shut ger family out from her. Believes family is watching her every more. Not responding to her children when they try to contact her."   Pt denies abuse. Pt reported, having a few glasses of wine, today while watching a movie. Pt reported, smoking a pack to two packs of cigarettes, daily. Pt's UDS is pending. Pt is linked to Dr. Adele Schilder for medication managements Pt reported, taking Abilify and Effexor-XR as prescribed. Pt reported, she can't afford her Lamictal but is planning to start backing taking it May 24, 2018.   Pt presents alert in scrubs with logical, coherent speech. Pt's eye contact was good. Pt's mood was pleasant. Pt's affect was flat. Pt's judgement was partial. Pt was oriented x4. Pt's concentration was normal. Pt's insight and impulse control was fair. Pt reported, if discharged from [facility] she could contract for safety.    Diagnosis: Major Depressive Disorder recurrent, severe with psychotic features.   Past Medical History:  Past Medical History:   Diagnosis Date  . Anemia    when she was younger  . Cancer [facility]) 2013   bilateral breast cancer  . Complication of anesthesia    ?, heart rate dropped w/wisdom teeth  . Depression   . Family history of breast cancer   . Family history of ovarian cancer   . Family history of pancreatic cancer   . GERD (gastroesophageal reflux disease)    with pregnancy only  . GERD (gastroesophageal reflux disease) 02/23/2017  . Grave's disease    diagnosed about 7 yrs ago but after a year she has been doing well  . Heart murmur    told off and on in past that she has had one  . High cholesterol   . Pneumonia   . Wears glasses     Past Surgical History:  Procedure Laterality Date  . [place], 2000   bilateral  . BREAST RECONSTRUCTION  05/12/2011   Procedure: BREAST RECONSTRUCTION;  Surgeon: Crissie Reese, MD;  Location: [place];  Service: Plastics;  Laterality: Bilateral;  PLACEMENT OF BILATERAL TISSUE EXPANDERS WITH POSSIBLE USE OF HD FLEX  . BREAST RECONSTRUCTION    . CESAREAN SECTION     2001,cyst; 2002 birth  . CHOLECYSTECTOMY    . MASTECTOMY Bilateral   . ORIF ANKLE FRACTURE Right 09/18/2013   Procedure: OPEN REDUCTION INTERNAL FIXATION (ORIF) RIGHT ANKLE FRACTURE;  Surgeon: Mcarthur Rossetti, MD;  Location: [place];  Service: Orthopedics;  Laterality: Right;  . WISDOM TOOTH EXTRACTION  Family History:  Family History  Problem Relation Age of Onset  . Heart disease Mother   . Stroke Mother   . COPD Mother   . Diabetes Mother   . Heart disease Father   . Lung cancer Father 60  . Colon polyps Father   . Breast cancer Maternal Grandmother 71  . Pancreatic cancer Maternal Grandmother        dx in her 30s  . Ovarian cancer Paternal Aunt        dx in her 89s  . Breast cancer Other        MGM's sister; unilateral breast cancer  . Cancer Other        PGMs mother; either stomach or ovarian cancer    Social History:  reports that she has been smoking  cigarettes. She has a 4.50 pack-year smoking history. She has never used smokeless tobacco. She reports current alcohol use. She reports that she does not use drugs.  Additional Social History:  Alcohol / Drug Use Pain Medications: See MAR Prescriptions: See MAR Over the Counter: See MAR History of alcohol / drug use?: Yes(UDS is pending.) Substance #1 Name of Substance 1: Alochol. 1 - Age of First Use: UTA 1 - Amount (size/oz): Pt reported, having a few glasses of wine.  1 - Frequency: Ongoing.  1 - Duration: Ongoing.  1 - Last Use / Amount: Today (05/07/2018) Substance #2 Name of Substance 2: Cigarettes.  2 - Age of First Use: UTA 2 - Amount (size/oz): Pt reported, smoking a pack to two packs of cigarettes, daily.  2 - Frequency: Daily. 2 - Duration: Ongoing.  2 - Last Use / Amount: Daily.   CIWA: CIWA-Ar BP: 113/88 Pulse Rate: 98 COWS:    Allergies:  Allergies  Allergen Reactions  . Clindamycin/Lincomycin Anaphylaxis  . Lunesta [Eszopiclone] Other (See Comments)    Causes loss of memory, confusion  . Penicillins Other (See Comments)    Did not work as a child    Home Medications: (Not in a hospital admission)   OB/GYN Status:  No LMP recorded.  General Assessment Data Assessment unable to be completed: Yes Reason for not completing assessment: per NP wait for labs  [facility] of Assessment: [facility] TTS Assessment: In system Is this a Tele or Face-to-Face Assessment?: Face-to-Face Is this an Initial Assessment or a Re-assessment for this encounter?: Initial Assessment Patient Accompanied by:: N/A Language Other than English: No Living Arrangements: Other (Comment)(Alone. ) What gender do you identify as?: Female Marital status: Separated Living Arrangements: Alone Can pt return to current living arrangement?: Yes Admission Status: Involuntary Petitioner: Other(Friend.) Is patient capable of signing voluntary admission?: No Referral Source:  Other([employer].) Insurance type: Self-pay.      Crisis Care Plan Living Arrangements: Alone Legal Guardian: Other:(Self. ) Name of Psychiatrist: Dr. Adele Schilder. Name of Therapist: NA  Education Status Is patient currently in school?: No Is the patient employed, unemployed or receiving disability?: Unemployed  Risk to self with the past 6 months Suicidal Ideation: Yes-Currently Present(Per IVC however the pt denies.) Has patient been a risk to self within the past 6 months prior to admission? : No(Pt denies. ) Suicidal Intent: No(Pt denies. ) Has patient had any suicidal intent within the past 6 months prior to admission? : No Is patient at risk for suicide?: No Suicidal Plan?: No(Pt denies. ) Has patient had any suicidal plan within the past 6 months prior to admission? : No Access to Means: No(Pt denies. ) What  has been your use of drugs/alcohol within the last 12 months?: UDS is pending.  Previous Attempts/Gestures: Yes How many times?: 2 Other Self Harm Risks: Pt denies.  Triggers for Past Attempts: Unknown Intentional Self Injurious Behavior: None(Pt denies. ) Family Suicide History: No Recent stressful life event(s): Other (Comment)(Not working. ) Persecutory voices/beliefs?: No(Pt denies. ) Depression: Yes Depression Symptoms: Tearfulness, Feeling worthless/self pity, Guilt, Fatigue(sadness.) Substance abuse history and/or treatment for substance abuse?: No Suicide prevention information given to non-admitted patients: Not applicable  Risk to Others within the past 6 months Homicidal Ideation: No(Pt denies. ) Does patient have any lifetime risk of violence toward others beyond the six months prior to admission? : No(Pt denies. ) Thoughts of Harm to Others: No Current Homicidal Intent: No Current Homicidal Plan: No Access to Homicidal Means: No Identified Victim: NA History of harm to others?: No Assessment of Violence: None Noted Violent Behavior Description: NA Does  patient have access to weapons?: No(Pt denies. ) Criminal Charges Pending?: No Does patient have a court date: No Is patient on probation?: No  Psychosis Hallucinations: None noted Delusions: Unspecified(Per IVC however the pt denies.)  Mental Status Report Appearance/Hygiene: In scrubs Eye Contact: Good Motor Activity: Unremarkable Speech: Logical/coherent Level of Consciousness: Alert Mood: Pleasant Affect: Flat Anxiety Level: Panic Attacks Panic attack frequency: Pt reported, before that her last panic attack was before she got married in 2000. Most recent panic attack: Pt reported, she had a mild panic attack a couple weeks ago. Thought Processes: Coherent, Relevant Judgement: Partial Orientation: Person, Place, Time, Situation Obsessive Compulsive Thoughts/Behaviors: None  Cognitive Functioning Concentration: Normal Memory: Recent Intact Is patient IDD: No Insight: Fair Impulse Control: Fair Appetite: Good Have you had any weight changes? : No Change Sleep: No Change Total Hours of Sleep: (Pt reported, "I sleep well." ) Vegetative Symptoms: Staying in bed, Not bathing, Decreased grooming  ADLScreening [facility] Assessment Services) Patient's cognitive ability adequate to safely complete daily activities?: Yes Patient able to express need for assistance with ADLs?: Yes Independently performs ADLs?: Yes (appropriate for developmental age)  Prior Inpatient Therapy Prior Inpatient Therapy: Yes Prior Therapy Dates: 2014 Prior Therapy Facilty[facility]Provider[facility]  Prior Outpatient Therapy Prior Outpatient Therapy: Yes Prior Therapy Dates: Current.  Prior Therapy Facilty/Provider(s): Dr. Adele Schilder. Reason for Treatment: Medication management. Does patient have an ACCT team?: No Does patient have Intensive In-House Services?  : No Does patient have [facility] services? : No Does patient have P4CC services?: No  ADL Screening (condition at time of admission) Patient's  cognitive ability adequate to safely complete daily activities?: Yes Is the patient deaf or have difficulty hearing?: No Does the patient have difficulty seeing, even when wearing glasses/contacts?: Yes(Pt reported, wearing glasses. ) Does the patient have difficulty concentrating, remembering, or making decisions?: No Patient able to express need for assistance with ADLs?: Yes Does the patient have difficulty dressing or bathing?: No Independently performs ADLs?: Yes (appropriate for developmental age) Does the patient have difficulty walking or climbing stairs?: No Weakness of Legs: None Weakness of Arms/Hands: None  Home Assistive Devices/Equipment Home Assistive Devices/Equipment: Eyeglasses    Abuse/Neglect Assessment (Assessment to be complete while patient is alone) Abuse/Neglect Assessment Can Be Completed: Yes Physical Abuse: Denies(Pt denies. ) Verbal Abuse: Denies(Pt denies. ) Sexual Abuse: Denies(Pt denies. ) Exploitation of patient/patient's resources: Denies(Pt denies. ) Self-Neglect: Denies(Pt denies. )     Advance Directives (For Healthcare) Does Patient Have a Medical Advance Directive?: No Would patient like information on creating a medical  advance directive?: No - Patient declined          Disposition: Lindon Romp, FNP recommends observe for safety, stabilization and re-evaluation. Disposition discussed with Dewitt Hoes, [place] and Darryll Capers, Therapist, sports.    Disposition Initial Assessment Completed for this Encounter: Yes  On Site Evaluation by: Vertell Novak, MS, [facility], CRC. Reviewed with Physician:  Dewitt Hoes, [place] and Lindon Romp, FNP.  Vertell Novak 05/07/2018 8:29 PM    Vertell Novak, [place], [facility], [facility] Triage Specialist [phone]

## 2018-05-07 NOTE — BHH Counselor (Addendum)
Clinician attempted to contact Fay Records, friend/IVC petitioner to obtain collateral information however the number listed on IVC is not legible. Clinician called [phone], the number did not ring and received the message, "your voicemail box is not set up." Clinicain called [phone] clinician received the message, "your call could not be competed as dialed."   Pt denies family, friend supports.  Vertell Novak, [place], [facility], [facility] Triage Specialist [phone]

## 2018-05-07 NOTE — ED Notes (Signed)
Pt A&O x 3, pt under IVC by friend, presents for medical clearance.  Pt denies SI, HI or AVH.  Pt reports she didn't want to discuss a situation with family friend and the friend IVC'ed her.  Pt does admit to previous SI attempt 6-7 yrs ago.  Denies feeling hopeless.  Smokes 1-2 packs per day.  Pt calm & cooperative at present.  Monitoring for safety, Q 15 min checks in effect.

## 2018-05-07 NOTE — ED Provider Notes (Signed)
[facility] DEPT Provider Note   CSN: [id number] Arrival date & time: 05/07/18  1737    History   Chief Complaint Chief Complaint  Patient presents with  . Mental Health Problem    HPI    Donna Black is a 53 y.o. female with a PMHx of anemia, b/l breast cancer s/p mastectomy, GERD, grave's disease, HLD, COPD, depression, and other conditions listed below, who presents to the ED under IVC.  Per IVC paperwork: "States that she thinks about suicide but probably won't do anything, states the people on TV are watching her and talking to her, hostile and has shut her family out from her, believes family is watching her every move, not responding to her children when they try to contact her".  When asked why she is here, patient states that her family member took out IVC paperwork because she is retaliating against the fact that the patient is trying to cut ties with them.  She reports that she is "breaking relationships off" with certain family members because of issues with them.  She believes a certain family member took out IVC paperwork and retaliation.  She denies feeling SI, HI, AVH, or using illicit drugs.  She is compliant with her medications which include Effexor, Abilify, pravastatin, Nexium, and other medications that she cannot recall currently (reviewed MAR, she verifies lamictal but states not on phentermine anymore, no other meds noted).  She states that she feels fine.  She admits to drinking alcohol "a few times per week" and when she drinks she has "a couple glasses of wine".  She admits that she had 2 glasses of wine today.  She endorses being a cigarette smoker.  She has no medical complaints at this time and is here involuntarily.  The history is provided by the patient, medical records and the police. No language interpreter was used.    Past Medical History:  Diagnosis Date  . Anemia    when she was younger  . Cancer [facility]) 2013   bilateral breast cancer  . Complication of anesthesia    ?, heart rate dropped w/wisdom teeth  . Depression   . Family history of breast cancer   . Family history of ovarian cancer   . Family history of pancreatic cancer   . GERD (gastroesophageal reflux disease)    with pregnancy only  . GERD (gastroesophageal reflux disease) 02/23/2017  . Grave's disease    diagnosed about 7 yrs ago but after a year she has been doing well  . Heart murmur    told off and on in past that she has had one  . High cholesterol   . Pneumonia   . Wears glasses     Patient Active Problem List   Diagnosis Date Noted  . Chronic cough 04/25/2017  . COPD (chronic obstructive pulmonary disease) ([place]) 04/25/2017  . Primary osteoarthritis of both knees 08/02/2016  . Genetic testing 10/07/2015  . Family history of breast cancer   . Family history of ovarian cancer   . Family history of pancreatic cancer   . MDD (major depressive disorder), recurrent, severe, with psychosis ([place]) 10/03/2014  . HX: breast cancer 10/03/2014  . Hx of Graves' disease 10/03/2014  . Closed bimalleolar fracture of right ankle 09/18/2013  . Bimalleolar fracture 09/18/2013  . Tylenol poisoning 10/09/2012  . Tobacco abuse 03/31/2011  . 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  [place], 2000   bilateral  . BREAST RECONSTRUCTION  05/12/2011   Procedure: BREAST RECONSTRUCTION;  Surgeon: Crissie Reese, MD;  Location: [facility];  Service: Plastics;  Laterality: Bilateral;  PLACEMENT OF BILATERAL TISSUE EXPANDERS WITH POSSIBLE USE OF HD FLEX  . BREAST RECONSTRUCTION    . CESAREAN SECTION     2001,cyst; 2002 birth  . CHOLECYSTECTOMY    . MASTECTOMY Bilateral   . ORIF ANKLE FRACTURE Right 09/18/2013   Procedure: OPEN REDUCTION INTERNAL FIXATION (ORIF) RIGHT ANKLE FRACTURE;  Surgeon: Mcarthur Rossetti, MD;  Location: [facility];  Service:  Orthopedics;  Laterality: Right;  . WISDOM TOOTH EXTRACTION       OB History   No obstetric history on file.      Home Medications    Prior to Admission medications   Medication Sig Start Date End Date Taking? Authorizing Provider  ARIPiprazole (ABILIFY) 20 MG tablet TAKE 1 TABLET BY MOUTH ATBEDTIME 03/21/18   Arfeen, Arlyce Harman, MD  esomeprazole (NEXIUM) 40 MG capsule Take 1 capsule (40 mg total) by mouth daily at 12 noon. 12/08/17   Collene Gobble, MD  lamoTRIgine (LAMICTAL) 200 MG tablet Take 1 tablet (200 mg total) by mouth daily. 03/21/18 03/21/19  Arfeen, Arlyce Harman, MD  phentermine 37.5 MG capsule Take 37.5 mg by mouth every morning.    [provider]  pravastatin (PRAVACHOL) 20 MG tablet Take 20 mg by mouth daily.    [provider]  venlafaxine XR (EFFEXOR-XR) 75 MG 24 hr capsule TAKE 1 CAPSULE BY MOUTH DAILY WITH BREAKFAST 03/21/18   Arfeen, Arlyce Harman, MD    Family History Family History  Problem Relation Age of Onset  . Heart disease Mother   . Stroke Mother   . COPD Mother   . Diabetes Mother   . Heart disease Father   . Lung cancer Father 84  . Colon polyps Father   . Breast cancer Maternal Grandmother 1  . Pancreatic cancer Maternal Grandmother        dx in her 60s  . Ovarian cancer Paternal Aunt        dx in her 50s  . Breast cancer Other        MGM's sister; unilateral breast cancer  . Cancer Other        PGMs mother; either stomach or ovarian cancer    Social History Social History   Tobacco Use  . Smoking status: Current Every Day Smoker    Packs/day: 0.75    Years: 6.00    Pack years: 4.50    Types: Cigarettes  . Smokeless tobacco: Never Used  . Tobacco comment: Has been tappering off 1 pill a day   Substance Use Topics  . Alcohol use: Yes    Comment: Occasional use with dinner  . Drug use: No     Allergies   Clindamycin/lincomycin; Lunesta [eszopiclone]; and Penicillins   Review of Systems Review of Systems  Constitutional:  Negative for chills and fever.  Respiratory: Negative for shortness of breath.   Cardiovascular: Negative for chest pain.  Gastrointestinal: Negative for abdominal pain, constipation, diarrhea, nausea and vomiting.  Genitourinary: Negative for dysuria and hematuria.  Musculoskeletal: Negative for arthralgias and myalgias.  Skin: Negative for color change.  Allergic/Immunologic: Negative for immunocompromised state.  Neurological: Negative for weakness and numbness.  Psychiatric/Behavioral: Negative for hallucinations and suicidal ideas.   All other systems reviewed and are negative for acute change except as noted in the  HPI.    Physical Exam Updated Vital Signs BP 123/81 (BP Location: Left Arm)   Pulse (!) 106   Temp 98.9 F (37.2 C) (Oral)   Resp 18   SpO2 100%  Recheck VS: BP 113/88 (BP Location: Right Arm)   Pulse 98   Temp 98.8 F (37.1 C) (Oral)   Resp 20   SpO2 100%    Physical Exam Vitals signs and nursing note reviewed.  Constitutional:      General: She is not in acute distress.    Appearance: Normal appearance. She is well-developed. She is not toxic-appearing.     Comments: Afebrile, nontoxic, initially irritated and slightly agitated but easily calmed down, NAD  HENT:     Head: Normocephalic and atraumatic.  Eyes:     General:        Right eye: No discharge.        Left eye: No discharge.     Conjunctiva/sclera: Conjunctivae normal.  Neck:     Musculoskeletal: Normal range of motion and neck supple.  Cardiovascular:     Rate and Rhythm: Normal rate and regular rhythm.     Pulses: Normal pulses.     Heart sounds: Normal heart sounds, S1 normal and S2 normal. No murmur. No friction rub. No gallop.      Comments: No tachycardia on exam Pulmonary:     Effort: Pulmonary effort is normal. No respiratory distress.     Breath sounds: Normal breath sounds. No decreased breath sounds, wheezing, rhonchi or rales.  Abdominal:     General: Bowel sounds are normal.  There is no distension.     Palpations: Abdomen is soft. Abdomen is not rigid.     Tenderness: There is no abdominal tenderness. There is no right CVA tenderness, left CVA tenderness, guarding or rebound. Negative signs include Murphy's sign and McBurney's sign.  Musculoskeletal: Normal range of motion.  Skin:    General: Skin is warm and dry.     Findings: No rash.  Neurological:     Mental Status: She is alert and oriented to person, place, and time.     Sensory: Sensation is intact. No sensory deficit.     Motor: Motor function is intact.  Psychiatric:        Attention and Perception: She does not perceive auditory or visual hallucinations.        Mood and Affect: Mood normal. Affect is angry.        Behavior: Behavior is agitated (initially).        Thought Content: Thought content does not include homicidal or suicidal ideation. Thought content does not include homicidal or suicidal plan.     Comments: Angry affect and slightly agitated initially but easily calmed down and later more pleasant and cooperative. Denies SI, HI, or AVH, doesn't seem to be responding to internal stimuli.       ED Treatments / Results  Labs (all labs ordered are listed, but only abnormal results are displayed) Labs Reviewed  COMPREHENSIVE METABOLIC PANEL - Abnormal; Notable for the following components:      Result Value   CO2 21 (*)    Calcium 8.8 (*)    All other components within normal limits  ETHANOL - Abnormal; Notable for the following components:   Alcohol, Ethyl (B) 105 (*)    All other components within normal limits  ACETAMINOPHEN LEVEL - Abnormal; Notable for the following components:   Acetaminophen (Tylenol), Serum <10 (*)    All other  components within normal limits  CBC WITH DIFFERENTIAL/PLATELET  SALICYLATE LEVEL  RAPID URINE DRUG SCREEN, HOSP PERFORMED  I-STAT BETA HCG BLOOD, ED ([facility], [facility], [facility] ONLY)    EKG None  Radiology No results found.  Procedures Procedures  (including critical care time)  Medications Ordered in ED Medications  ARIPiprazole (ABILIFY) tablet 20 mg (has no administration in time range)  pantoprazole (PROTONIX) EC tablet 40 mg (has no administration in time range)  lamoTRIgine (LAMICTAL) tablet 200 mg (has no administration in time range)  pravastatin (PRAVACHOL) tablet 20 mg (has no administration in time range)  venlafaxine XR (EFFEXOR-XR) 24 hr capsule 75 mg (has no administration in time range)  acetaminophen (TYLENOL) tablet 650 mg (has no administration in time range)  zolpidem (AMBIEN) tablet 5 mg (has no administration in time range)  ondansetron (ZOFRAN) tablet 4 mg (has no administration in time range)  alum & mag hydroxide-simeth (MAALOX/MYLANTA) 200-200-20 MG/5ML suspension 30 mL (has no administration in time range)  nicotine (NICODERM CQ - dosed in mg/24 hours) patch 21 mg (has no administration in time range)     Initial Impression / Assessment and Plan / ED Course  I have reviewed the triage vital signs and the nursing notes.  Pertinent labs & imaging results that were available during my care of the patient were reviewed by me and considered in my medical decision making (see chart for details).        53 y.o. female here under involuntary commitment, IVC paperwork stating that she thinks about suicide and has exhibited some paranoid behaviors.  Patient states that she is not suicidal and that this was taken out by a family member who she is trying to cut ties with and it was a retaliation move.  She initially was very upset and was unwilling to cooperate, but later was calmed and was willing to cooperate with our procedures. She denies SI/HI/AVH, denies drug use. Endorses EtOH use a few times a week, including today (2 glasses of wine), and is a cigarette smoker, cessation advised for these. She has no medical complaints at this time. On exam, she was initially irritable and slightly agitated but easily calmed  down, but otherwise her exam is unremarkable. Will get psych clearance labs and TTS consultation and reassess shortly.  7:40 PM CBC w/diff WNL. CMP unremarkable. EtOH level 105. Salicylate and acetaminophen levels WNL. BetaHCG neg. UDS pending, but does not interfere with med clearance. Pt medically cleared at this time. Psych hold orders and home med orders placed. Please see TTS notes for further documentation of care/dispo. Pt stable at time of med clearance.     Final Clinical Impressions(s) / ED Diagnoses   Final diagnoses:  Involuntary commitment  Paranoid behavior [facility])  Tobacco user  Medical clearance for psychiatric admission    ED Discharge Orders    [address], [place], [place] 05/07/18 [place], Dan, DO 05/07/18 2003

## 2018-05-07 NOTE — ED Notes (Signed)
Bed: [facility]BH37 Expected date:  Expected time:  Means of arrival:  Comments: 

## 2018-05-07 NOTE — ED Triage Notes (Signed)
She arrives with two [employer] officers. She has IVC paperwork alleging that she is a danger to herself and that she has mentioned suicide recently. The patient tells me that "there is nothing to that--I do not want to hurt myself or anyone else. The person who took out the papers on me is trying to force me out of her life". She is altogether pleasant and cooperative in her demeanor.

## 2018-05-08 ENCOUNTER — Other Ambulatory Visit: Payer: Self-pay

## 2018-05-08 ENCOUNTER — Telehealth (HOSPITAL_COMMUNITY): Payer: Self-pay

## 2018-05-08 ENCOUNTER — Inpatient Hospital Stay (HOSPITAL_COMMUNITY)
Admission: AD | Admit: 2018-05-08 | Discharge: 2018-05-11 | DRG: 885 | Disposition: A | Discharge status: Home / Self Care | Payer: No Typology Code available for payment source | Attending: Psychiatry | Admitting: Psychiatry

## 2018-05-08 ENCOUNTER — Encounter (HOSPITAL_COMMUNITY): Payer: Self-pay | Admitting: *Deleted

## 2018-05-08 DIAGNOSIS — E0500 Thyrotoxicosis with diffuse goiter without thyrotoxic crisis or storm: Secondary | ICD-10-CM | POA: Diagnosis present

## 2018-05-08 DIAGNOSIS — F332 Major depressive disorder, recurrent severe without psychotic features: Secondary | ICD-10-CM | POA: Diagnosis present

## 2018-05-08 DIAGNOSIS — F17210 Nicotine dependence, cigarettes, uncomplicated: Secondary | ICD-10-CM | POA: Diagnosis present

## 2018-05-08 DIAGNOSIS — Z888 Allergy status to other drugs, medicaments and biological substances status: Secondary | ICD-10-CM

## 2018-05-08 DIAGNOSIS — Z833 Family history of diabetes mellitus: Secondary | ICD-10-CM

## 2018-05-08 DIAGNOSIS — Z800 Family history of malignant neoplasm of digestive organs: Secondary | ICD-10-CM | POA: Diagnosis not present

## 2018-05-08 DIAGNOSIS — Z881 Allergy status to other antibiotic agents status: Secondary | ICD-10-CM

## 2018-05-08 DIAGNOSIS — Z801 Family history of malignant neoplasm of trachea, bronchus and lung: Secondary | ICD-10-CM | POA: Diagnosis not present

## 2018-05-08 DIAGNOSIS — Z8371 Family history of colonic polyps: Secondary | ICD-10-CM

## 2018-05-08 DIAGNOSIS — F333 Major depressive disorder, recurrent, severe with psychotic symptoms: Secondary | ICD-10-CM

## 2018-05-08 DIAGNOSIS — Z825 Family history of asthma and other chronic lower respiratory diseases: Secondary | ICD-10-CM

## 2018-05-08 DIAGNOSIS — Z818 Family history of other mental and behavioral disorders: Secondary | ICD-10-CM | POA: Diagnosis not present

## 2018-05-08 DIAGNOSIS — E785 Hyperlipidemia, unspecified: Secondary | ICD-10-CM | POA: Diagnosis present

## 2018-05-08 DIAGNOSIS — Z915 Personal history of self-harm: Secondary | ICD-10-CM | POA: Diagnosis not present

## 2018-05-08 DIAGNOSIS — Z823 Family history of stroke: Secondary | ICD-10-CM | POA: Diagnosis not present

## 2018-05-08 DIAGNOSIS — Z803 Family history of malignant neoplasm of breast: Secondary | ICD-10-CM | POA: Diagnosis not present

## 2018-05-08 DIAGNOSIS — R45851 Suicidal ideations: Secondary | ICD-10-CM | POA: Diagnosis present

## 2018-05-08 DIAGNOSIS — Z880 Allergy status to penicillin: Secondary | ICD-10-CM

## 2018-05-08 DIAGNOSIS — F419 Anxiety disorder, unspecified: Secondary | ICD-10-CM | POA: Diagnosis present

## 2018-05-08 DIAGNOSIS — Z8249 Family history of ischemic heart disease and other diseases of the circulatory system: Secondary | ICD-10-CM | POA: Diagnosis not present

## 2018-05-08 DIAGNOSIS — K219 Gastro-esophageal reflux disease without esophagitis: Secondary | ICD-10-CM | POA: Diagnosis present

## 2018-05-08 DIAGNOSIS — Z8041 Family history of malignant neoplasm of ovary: Secondary | ICD-10-CM

## 2018-05-08 DIAGNOSIS — Y905 Blood alcohol level of 100-119 mg/100 ml: Secondary | ICD-10-CM | POA: Diagnosis present

## 2018-05-08 DIAGNOSIS — E7800 Pure hypercholesterolemia, unspecified: Secondary | ICD-10-CM | POA: Diagnosis present

## 2018-05-08 DIAGNOSIS — F4325 Adjustment disorder with mixed disturbance of emotions and conduct: Secondary | ICD-10-CM | POA: Diagnosis present

## 2018-05-08 DIAGNOSIS — R450 Nervousness: Secondary | ICD-10-CM | POA: Diagnosis not present

## 2018-05-08 DIAGNOSIS — Z853 Personal history of malignant neoplasm of breast: Secondary | ICD-10-CM | POA: Diagnosis not present

## 2018-05-08 DIAGNOSIS — F331 Major depressive disorder, recurrent, moderate: Secondary | ICD-10-CM

## 2018-05-08 LAB — RAPID URINE DRUG SCREEN, HOSP PERFORMED
Amphetamines: NOT DETECTED
Barbiturates: NOT DETECTED
Benzodiazepines: NOT DETECTED
COCAINE: NOT DETECTED
Opiates: NOT DETECTED
Tetrahydrocannabinol: NOT DETECTED

## 2018-05-08 MED ORDER — ACETAMINOPHEN 325 MG PO TABS: 650.0000 mg | ORAL_TABLET | ORAL | Status: DC | PRN

## 2018-05-08 MED ORDER — ARIPIPRAZOLE 10 MG PO TABS
20.0000 mg | ORAL_TABLET | Freq: Every day | ORAL | Status: DC
  Administered 2018-05-08 – 2018-05-10 (×3): 20 mg via ORAL
  Filled 2018-05-08 (×2): qty 28
  Filled 2018-05-08 (×5): qty 2

## 2018-05-08 MED ORDER — PRAVASTATIN SODIUM 20 MG PO TABS
20.0000 mg | ORAL_TABLET | Freq: Every day | ORAL | Status: DC
  Administered 2018-05-09 – 2018-05-11 (×3): 20 mg via ORAL
  Filled 2018-05-08 (×5): qty 1

## 2018-05-08 MED ORDER — PANTOPRAZOLE SODIUM 40 MG PO TBEC
40.0000 mg | DELAYED_RELEASE_TABLET | Freq: Every day | ORAL | Status: DC
  Administered 2018-05-09 – 2018-05-11 (×3): 40 mg via ORAL
  Filled 2018-05-08 (×5): qty 1

## 2018-05-08 MED ORDER — ONDANSETRON HCL 4 MG PO TABS: 4.0000 mg | ORAL_TABLET | Freq: Three times a day (TID) | ORAL | Status: DC | PRN

## 2018-05-08 MED ORDER — NICOTINE 21 MG/24HR TD PT24
21.0000 mg | MEDICATED_PATCH | Freq: Every day | TRANSDERMAL | Status: DC
  Filled 2018-05-08 (×5): qty 1

## 2018-05-08 MED ORDER — VENLAFAXINE HCL ER 75 MG PO CP24
75.0000 mg | ORAL_CAPSULE | Freq: Every day | ORAL | Status: DC
  Administered 2018-05-09 – 2018-05-11 (×3): 75 mg via ORAL
  Filled 2018-05-08 (×3): qty 1
  Filled 2018-05-08 (×2): qty 14
  Filled 2018-05-08: qty 1

## 2018-05-08 MED ORDER — LAMOTRIGINE 100 MG PO TABS
200.0000 mg | ORAL_TABLET | Freq: Every day | ORAL | Status: DC
  Administered 2018-05-09 – 2018-05-11 (×3): 200 mg via ORAL
  Filled 2018-05-08 (×2): qty 2
  Filled 2018-05-08 (×2): qty 28
  Filled 2018-05-08: qty 2
  Filled 2018-05-08: qty 1

## 2018-05-08 MED ORDER — ALUM & MAG HYDROXIDE-SIMETH 200-200-20 MG/5ML PO SUSP: 30.0000 mL | Freq: Four times a day (QID) | ORAL | Status: DC | PRN

## 2018-05-08 MED ORDER — MAGNESIUM HYDROXIDE 400 MG/5ML PO SUSP: 30.0000 mL | Freq: Every day | ORAL | Status: DC | PRN

## 2018-05-08 NOTE — Consult Note (Addendum)
[facility] Face-to-Face Psychiatry Consult   Reason for Consult:   IVC'd by her family for depression Referring Physician:  EDP Patient Identification: Donna Black MRN:  [id number] Principal Diagnosis: MDD (major depressive disorder), recurrent, severe, with psychosis ([place]) Diagnosis:  Principal Problem:   MDD (major depressive disorder), recurrent, severe, with psychosis ([place]) Active Problems:   Major depressive disorder, recurrent severe without psychotic features ([place])   Total Time spent with patient: 45 minutes  Subjective:   Donna Black is a 53 y.o. female patient admitted with suicide threat.  HPI:  53 yo female who presented to the ED under IVC per her family for suicidal ideations and thinking people are talking to her via television.  Not contacting her family as she feels they are watching her.  Her psychiatrist, Dr Adele Schilder, was called per patient's request as she did not want her family notified and he said this is usually how she starts prior to a break.  The last time it happened she had an overdose and was in ICU.  Patient denies all accusations but based on collateral information the IVC was upheld and transferred to [facility] inpatient.    Past Psychiatric History: depression  Risk to Self: Suicidal Ideation: Yes-Currently Present(Per IVC however the pt denies.) Suicidal Intent: No(Pt denies. ) Is patient at risk for suicide?: No Suicidal Plan?: No(Pt denies. ) Access to Means: No(Pt denies. ) What has been your use of drugs/alcohol within the last 12 months?: UDS is pending.  How many times?: 2 Other Self Harm Risks: Pt denies.  Triggers for Past Attempts: Unknown Intentional Self Injurious Behavior: None(Pt denies. ) Risk to Others: Homicidal Ideation: No(Pt denies. ) Thoughts of Harm to Others: No Current Homicidal Intent: No Current Homicidal Plan: No Access to Homicidal Means: No Identified Victim: NA History of harm to others?: No Assessment of Violence: None  Noted Violent Behavior Description: NA Does patient have access to weapons?: No(Pt denies. ) Criminal Charges Pending?: No Does patient have a court date: No Prior Inpatient Therapy: Prior Inpatient Therapy: Yes Prior Therapy Dates: 2014 Prior Therapy Facilty/Provider(s): [facility]. Prior Outpatient Therapy: Prior Outpatient Therapy: Yes Prior Therapy Dates: Current.  Prior Therapy Facilty/Provider(s): Dr. Adele Schilder. Reason for Treatment: Medication management. Does patient have an ACCT team?: No Does patient have Intensive In-House Services?  : No Does patient have [facility] services? : No Does patient have P4CC services?: No  Past Medical History:  Past Medical History:  Diagnosis Date  . Anemia    when she was younger  . Cancer [facility]) 2013   bilateral breast cancer  . Complication of anesthesia    ?, heart rate dropped w/wisdom teeth  . Depression   . Family history of breast cancer   . Family history of ovarian cancer   . Family history of pancreatic cancer   . GERD (gastroesophageal reflux disease)    with pregnancy only  . GERD (gastroesophageal reflux disease) 02/23/2017  . Grave's disease    diagnosed about 7 yrs ago but after a year she has been doing well  . Heart murmur    told off and on in past that she has had one  . High cholesterol   . Pneumonia   . Wears glasses     Past Surgical History:  Procedure Laterality Date  . [place], 2000   bilateral  . BREAST RECONSTRUCTION  05/12/2011   Procedure: BREAST RECONSTRUCTION;  Surgeon: Crissie Reese, MD;  Location: [place];  Service: Plastics;  Laterality: Bilateral;  PLACEMENT OF BILATERAL TISSUE EXPANDERS WITH POSSIBLE USE OF HD FLEX  . BREAST RECONSTRUCTION    . CESAREAN SECTION     2001,cyst; 2002 birth  . CHOLECYSTECTOMY    . MASTECTOMY Bilateral   . ORIF ANKLE FRACTURE Right 09/18/2013   Procedure: OPEN REDUCTION INTERNAL FIXATION (ORIF) RIGHT ANKLE FRACTURE;  Surgeon: Mcarthur Rossetti, MD;  Location: [place];  Service: Orthopedics;  Laterality: Right;  . WISDOM TOOTH EXTRACTION     Family History:  Family History  Problem Relation Age of Onset  . Heart disease Mother   . Stroke Mother   . COPD Mother   . Diabetes Mother   . Heart disease Father   . Lung cancer Father 73  . Colon polyps Father   . Breast cancer Maternal Grandmother 33  . Pancreatic cancer Maternal Grandmother        dx in her 41s  . Ovarian cancer Paternal Aunt        dx in her 37s  . Breast cancer Other        MGM's sister; unilateral breast cancer  . Cancer Other        PGMs mother; either stomach or ovarian cancer   Family Psychiatric  History: None per chart review.  Social History:  Social History   Substance and Sexual Activity  Alcohol Use Yes   Comment: Occasional use with dinner     Social History   Substance and Sexual Activity  Drug Use No    Social History   Socioeconomic History  . Marital status: Married    Spouse name: Not on file  . Number of children: 3  . Years of education: Not on file  . Highest education level: Not on file  Occupational History  . Not on file  Social Needs  . Financial resource strain: Not on file  . Food insecurity:    Worry: Not on file    Inability: Not on file  . Transportation needs:    Medical: Not on file    Non-medical: Not on file  Tobacco Use  . Smoking status: Current Every Day Smoker    Packs/day: 0.75    Years: 6.00    Pack years: 4.50    Types: Cigarettes  . Smokeless tobacco: Never Used  . Tobacco comment: Has been tappering off 1 pill a day   Substance and Sexual Activity  . Alcohol use: Yes    Comment: Occasional use with dinner  . Drug use: No  . Sexual activity: Not Currently    Birth control/protection: Condom  Lifestyle  . Physical activity:    Days per week: Not on file    Minutes per session: Not on file  . Stress: Not on file  Relationships  . Social connections:    Talks on phone: Not on  file    Gets together: Not on file    Attends religious service: Not on file    Active member of club or organization: Not on file    Attends meetings of clubs or organizations: Not on file    Relationship status: Not on file  Other Topics Concern  . Not on file  Social History Narrative  . Not on file   Additional Social History: N/A    Allergies:   Allergies  Allergen Reactions  . Clindamycin/Lincomycin Anaphylaxis  . Lunesta [Eszopiclone] Other (See Comments)    Causes loss of memory, confusion  . Penicillins Other (See Comments)  Did not work as a child    Labs:  Results for orders placed or performed during the hospital encounter of 05/07/18 (from the past 48 hour(s))  CBC with Differential/Platelet     Status: None   Collection Time: 05/07/18  6:37 PM  Result Value Ref Range   WBC 7.1 4.0 - 10.5 K/uL   RBC 4.31 3.87 - 5.11 MIL/uL   Hemoglobin 14.0 12.0 - 15.0 g/dL   HCT 42.7 36.0 - 46.0 %   MCV 99.1 80.0 - 100.0 fL   MCH 32.5 26.0 - 34.0 pg   MCHC 32.8 30.0 - 36.0 g/dL   RDW 13.2 11.5 - 15.5 %   Platelets 292 150 - 400 K/uL   nRBC 0.0 0.0 - 0.2 %   Neutrophils Relative % 55 %   Neutro Abs 3.9 1.7 - 7.7 K/uL   Lymphocytes Relative 27 %   Lymphs Abs 1.9 0.7 - 4.0 K/uL   Monocytes Relative 11 %   Monocytes Absolute 0.8 0.1 - 1.0 K/uL   Eosinophils Relative 6 %   Eosinophils Absolute 0.4 0.0 - 0.5 K/uL   Basophils Relative 1 %   Basophils Absolute 0.1 0.0 - 0.1 K/uL   Immature Granulocytes 0 %   Abs Immature Granulocytes 0.03 0.00 - 0.07 K/uL    Comment: Performed at [facility], [place] [address], [place], [place] [address]  Comprehensive metabolic panel     Status: Abnormal   Collection Time: 05/07/18  6:37 PM  Result Value Ref Range   Sodium 141 135 - 145 mmol/L   Potassium 3.6 3.5 - 5.1 mmol/L   Chloride 108 98 - 111 mmol/L   CO2 21 (L) 22 - 32 mmol/L   Glucose, Bld 98 70 - 99 mg/dL   BUN 14 6 - 20 mg/dL   Creatinine, Ser 0.79 0.44  - 1.00 mg/dL   Calcium 8.8 (L) 8.9 - 10.3 mg/dL   Total Protein 7.4 6.5 - 8.1 g/dL   Albumin 3.8 3.5 - 5.0 g/dL   AST 22 15 - 41 U/L   ALT 27 0 - 44 U/L   Alkaline Phosphatase 61 38 - 126 U/L   Total Bilirubin 0.3 0.3 - 1.2 mg/dL   GFR calc non Af Amer >60 >60 mL/min   GFR calc Af Amer >60 >60 mL/min   Anion gap 12 5 - 15    Comment: Performed at [facility], [place] [employer] [address], [place], [place] [address]  Ethanol     Status: Abnormal   Collection Time: 05/07/18  6:37 PM  Result Value Ref Range   Alcohol, Ethyl (B) 105 (H) <10 mg/dL    Comment: (NOTE) Lowest detectable limit for serum alcohol is 10 mg/dL. For medical purposes only. Performed at [facility], [place] [address], [place], [place] [address]   Salicylate level     Status: None   Collection Time: 05/07/18  6:37 PM  Result Value Ref Range   Salicylate Lvl <5.9 2.8 - 30.0 mg/dL    Comment: Performed at [facility], [place] [address], [place], [place] [address]  Acetaminophen level     Status: Abnormal   Collection Time: 05/07/18  6:37 PM  Result Value Ref Range   Acetaminophen (Tylenol), Serum <10 (L) 10 - 30 ug/mL    Comment: (NOTE) Therapeutic concentrations vary significantly. A range of 10-30 ug/mL  may be an effective concentration for many patients. However, some  are best treated at  concentrations outside of this range. Acetaminophen concentrations >150 ug/mL at 4 hours after ingestion  and >50 ug/mL at 12 hours after ingestion are often associated with  toxic reactions. Performed at [facility], [place] [address], [place], [place] [address]   I-Stat beta hCG blood, ED ([facility], [facility], [facility] only)     Status: None   Collection Time: 05/07/18  6:41 PM  Result Value Ref Range   I-stat hCG, quantitative <5.0 <5 mIU/mL   Comment 3            Comment:   GEST. AGE      CONC.  (mIU/mL)   <=1 WEEK        5 - 50     2 WEEKS       50 - 500     3 WEEKS        100 - 10,000     4 WEEKS     1,000 - 30,000        FEMALE AND NON-PREGNANT FEMALE:     LESS THAN 5 mIU/mL   Rapid urine drug screen (hospital performed)     Status: None   Collection Time: 05/08/18  7:54 AM  Result Value Ref Range   Opiates NONE DETECTED NONE DETECTED   Cocaine NONE DETECTED NONE DETECTED   Benzodiazepines NONE DETECTED NONE DETECTED   Amphetamines NONE DETECTED NONE DETECTED   Tetrahydrocannabinol NONE DETECTED NONE DETECTED   Barbiturates NONE DETECTED NONE DETECTED    Comment: (NOTE) DRUG SCREEN FOR MEDICAL PURPOSES ONLY.  IF CONFIRMATION IS NEEDED FOR ANY PURPOSE, NOTIFY LAB WITHIN 5 DAYS. LOWEST DETECTABLE LIMITS FOR URINE DRUG SCREEN Drug Class                     Cutoff (ng/mL) Amphetamine and metabolites    1000 Barbiturate and metabolites    200 Benzodiazepine                 629 Tricyclics and metabolites     300 Opiates and metabolites        300 Cocaine and metabolites        300 THC                            50 Performed at [facility], [place] [address] [place], [place] [address]     Current Facility-Administered Medications  Medication Dose Route Frequency Provider Last Rate Last Dose  . acetaminophen (TYLENOL) tablet 650 mg  650 mg Oral Q4H PRN Street, [place], [place]      . alum & mag hydroxide-simeth (MAALOX/MYLANTA) 200-200-20 MG/5ML suspension 30 mL  30 mL Oral Q6H PRN Street, [place], [place]      . ARIPiprazole (ABILIFY) tablet 20 mg  20 mg Oral [address], [place], [place]   20 mg at 05/07/18 2129  . lamoTRIgine (LAMICTAL) tablet 200 mg  200 mg Oral Daily [address] [place], [place]   200 mg at 05/08/18 1110  . nicotine (NICODERM CQ - dosed in mg/24 hours) patch 21 mg  21 mg Transdermal Daily Street, [place], PA-C      . ondansetron [place]) tablet 4 mg  4 mg Oral Q8H PRN Street, [place], [place]      . pantoprazole (PROTONIX) EC tablet 40 mg  40 mg Oral Daily [address], [place], PA-C   40 mg at 05/08/18 1110  . pravastatin  (PRAVACHOL) tablet 20 mg  20 mg Oral  Daily [address] [place], [place]   20 mg at 05/08/18 1110  . venlafaxine XR (EFFEXOR-XR) 24 hr capsule 75 mg  75 mg Oral Q breakfast [address] [place], [place]   75 mg at 05/08/18 6803   Current Outpatient Medications  Medication Sig Dispense Refill  . ARIPiprazole (ABILIFY) 20 MG tablet TAKE 1 TABLET BY MOUTH ATBEDTIME 90 tablet 0  . esomeprazole (NEXIUM) 40 MG capsule Take 1 capsule (40 mg total) by mouth daily at 12 noon. 3 capsule 0  . lamoTRIgine (LAMICTAL) 200 MG tablet Take 1 tablet (200 mg total) by mouth daily. 90 tablet 0  . phentermine 37.5 MG capsule Take 37.5 mg by mouth every morning.    . pravastatin (PRAVACHOL) 40 MG tablet Take 40 mg by mouth daily.    Marland Kitchen venlafaxine XR (EFFEXOR-XR) 75 MG 24 hr capsule TAKE 1 CAPSULE BY MOUTH DAILY WITH BREAKFAST 90 capsule 0    Musculoskeletal: Strength & Muscle Tone: within normal limits Gait & Station: normal Patient leans: N/A  Psychiatric Specialty Exam: Physical Exam  Nursing note and vitals reviewed. Constitutional: She is oriented to person, place, and time. She appears well-developed and well-nourished.  HENT:  Head: Normocephalic.  Neck: Normal range of motion.  Respiratory: Effort normal.  Musculoskeletal: Normal range of motion.  Neurological: She is alert and oriented to person, place, and time.  Psychiatric: Her speech is normal and behavior is normal. Her mood appears anxious. Thought content is delusional. Cognition and memory are normal. She expresses impulsivity. She exhibits a depressed mood.    Review of Systems  Psychiatric/Behavioral: Positive for depression. The patient is nervous/anxious.   All other systems reviewed and are negative.   Blood pressure 139/78, pulse 99, temperature 98.8 F (37.1 C), temperature source Oral, resp. rate 16, SpO2 98 %.There is no height or weight on file to calculate BMI.  General Appearance: Casual  Eye Contact:  Fair  Speech:  Normal Rate   Volume:  Normal  Mood:  Anxious and Depressed  Affect:  Constricted  Thought Process:  Coherent and Descriptions of Associations: Intact  Orientation:  Full (Time, Place, and Person)  Thought Content:  Delusions and Paranoid Ideation  Suicidal Thoughts:  Yes.  without intent/plan  Homicidal Thoughts:  No  Memory:  Immediate;   Fair Recent;   Fair Remote;   Fair  Judgement:  Impaired  Insight:  Lacking  Psychomotor Activity:  Decreased  Concentration:  Concentration: Fair and Attention Span: Fair  Recall:  [employer] of Knowledge:  Fair  Language:  Good  Akathisia:  No  Handed:  Right  AIMS (if indicated):   N/A  Assets:  Housing Leisure Time Physical Health Resilience Social Support  ADL's:  Intact  Cognition:  WNL  Sleep:   N/A     Treatment Plan Summary: Daily contact with patient to assess and evaluate symptoms and progress in treatment and Medication management  -Restarted Abilify 20 mg at bedtime -Restarted Lamictal 200 mg daily -Restarted Effexor 75 mg daily  GERD: -Restarted protonix 40 mg daily -Restarted Zofran 4 mg every 8 hours PRN  Hyperlipidemia -Restarted Pravastatin 40 mg daily  Disposition: Recommend psychiatric Inpatient admission when medically cleared.  Waylan Boga, NP 05/08/2018 2:29 PM   Patient seen face-to-face for psychiatric evaluation, chart reviewed and case discussed with the physician extender and developed treatment plan. Reviewed the information documented and agree with the treatment plan.  Buford Dresser, DO 05/08/18 6:04 PM

## 2018-05-08 NOTE — Telephone Encounter (Signed)
Patients daughter called me to give collateral information about her mother and why they took out the IVC. I advised daughter at the beginning of the call that I could not confirm or deny that she is a patient, however I could take down the collateral information. Patient has been having visual and audio hallucinations. Patient believes that people are watching her through the television, she believes that she is being watched at the stores through cameras and that the cashiers are talking about her. Patient has stated that Dr. Adele Schilder is aware of her delusion and he knows what is happening. I brought all of this to the attention of Dr. Adele Schilder and he asked me to call ED and ask them to put patient inpatient. I called and spoke with Waylan Boga and she states that patient was just going to be on a 24 hour hold and then be discharged. I explained to her that Dr. Adele Schilder would like her to go inpatient as she has had suicide attempts in the past that have led to ICU. Patient is very manipulative and will try to say she is fine, but she needs to be stabilized. Theodoro Clock heard what I said and stated they would work on placement

## 2018-05-08 NOTE — Tx Team (Signed)
Initial Treatment Plan 05/08/2018 4:59 PM Lawana Pai WGN:[id number]    PATIENT STRESSORS: Marital or family conflict   PATIENT STRENGTHS: Ability for insight Average or above average intelligence Capable of independent living General fund of knowledge Physical Health Supportive family/friends   PATIENT IDENTIFIED PROBLEMS: Psychosis "I don't feel suicidal, I don't feel depressed, I don't want to hurt anyone"                     DISCHARGE CRITERIA:  Ability to meet basic life and health needs Improved stabilization in mood, thinking, and/or behavior Verbal commitment to aftercare and medication compliance  PRELIMINARY DISCHARGE PLAN: Attend aftercare/continuing care group Return to previous living arrangement  PATIENT/FAMILY INVOLVEMENT: This treatment plan has been presented to and reviewed with the patient, Donna Black, and/or family member, .  The patient and family have been given the opportunity to ask questions and make suggestions.  Nidal Rivet, [place], [place] 05/08/2018, 4:59 PM

## 2018-05-08 NOTE — ED Notes (Signed)
Pt discharged safely with [employer].  Pt was calm and cooperative.  Denies S/I and H/I.  Pt denies S/I,  All belongings were sent with patient.

## 2018-05-08 NOTE — Progress Notes (Signed)
Donna Black is a 53 year old female pt admitted on involuntary basis. On admission, she reports that she really does not know why she is here and reports that nobody has told her why she was coming. When explained to her why she was here, she refuted all allegations against her. She reports that she has been doing good, taking her medications as she should, denies SI/HI and able to contract for safety while in the hospital. She reports that she has been speaking to her daughters on the phone as they live in the [place] area. She denies any substance abuse issues. She reports that she lives with her son currently and reports that she will return there once she is discharged. Tahja was cooperative during admission process, oriented to the milieu and safety maintained.

## 2018-05-08 NOTE — BH Assessment (Signed)
[place] Assessment Progress Note This Probation officer spoke to Yahoo MD (with patient's permission) to gather collateral information in reference to treatment concerns. Arfeen MD states based on case review, that he feels patient should continue to be observed and monitored with further stabilization required before patient is discharged.

## 2018-05-08 NOTE — BH Assessment (Signed)
Onset Assessment Progress Note  Per Waylan Boga, DNP, this pt requires psychiatric hospitalization.  Letitia Libra, RN, [facility] has assigned pt to [facility] Rm 401-1.  Pt presents under IVC initiated by a friend of the pt, and upheld by Buford Dresser, DO, and IVC documents have been faxed to [facility].  Pt's nurse, Nena Jordan, has been notified, and agrees to call report to [phone].  Pt is to be transported via Event organiser.   Jalene Mullet, [place] Coordinator [phone]

## 2018-05-09 DIAGNOSIS — R450 Nervousness: Secondary | ICD-10-CM

## 2018-05-09 DIAGNOSIS — F332 Major depressive disorder, recurrent severe without psychotic features: Principal | ICD-10-CM

## 2018-05-09 MED ORDER — ONDANSETRON 4 MG PO TBDP: 4.0000 mg | ORAL_TABLET | Freq: Four times a day (QID) | ORAL | Status: DC | PRN

## 2018-05-09 MED ORDER — LOPERAMIDE HCL 2 MG PO CAPS: 2.0000 mg | ORAL_CAPSULE | ORAL | Status: DC | PRN

## 2018-05-09 MED ORDER — VITAMIN B-1 100 MG PO TABS
100.0000 mg | ORAL_TABLET | Freq: Every day | ORAL | Status: DC
  Administered 2018-05-10 – 2018-05-11 (×2): 100 mg via ORAL
  Filled 2018-05-09 (×4): qty 1

## 2018-05-09 MED ORDER — LORAZEPAM 1 MG PO TABS: 1.0000 mg | ORAL_TABLET | Freq: Four times a day (QID) | ORAL | Status: DC | PRN

## 2018-05-09 MED ORDER — HYDROXYZINE HCL 25 MG PO TABS: 25.0000 mg | ORAL_TABLET | Freq: Four times a day (QID) | ORAL | Status: DC | PRN

## 2018-05-09 MED ORDER — ADULT MULTIVITAMIN W/MINERALS CH
1.0000 | ORAL_TABLET | Freq: Every day | ORAL | Status: DC
  Administered 2018-05-09 – 2018-05-11 (×3): 1 via ORAL
  Filled 2018-05-09 (×6): qty 1

## 2018-05-09 NOTE — Progress Notes (Signed)
DAR NOTE: Pt present with flat affect and depressed mood in the unit.Pt stated she does not know she is here as she id not suicidal, neither is she hearing voices or seeing things.  Pt denies physical pain, took all her meds as scheduled.  Pt's safety ensured with 15 minute and environmental checks. Pt currently denies SI/HI and A/V hallucinations. Pt verbally agrees to seek staff if SI/HI or A/VH occurs and to consult with staff before acting on these thoughts. Will continue POC.

## 2018-05-09 NOTE — BHH Group Notes (Signed)
Adult Psychoeducational Group Note  Date:  05/09/2018 Time:  10:11 PM  Group Topic/Focus:  Wrap-Up Group:   The focus of this group is to help patients review their daily goal of treatment and discuss progress on daily workbooks.  Participation Level:  Active  Participation Quality:  Appropriate and Attentive  Affect:  Appropriate  Cognitive:  Alert and Appropriate  Insight: Appropriate and Good  Engagement in Group:  Engaged  Modes of Intervention:  Discussion and Education  Additional Comments:  Pt attended and participated in wrap up group this evening.P:t rated their day an 8/10, due to their daughter coming to visit them today. Pt completed their goal, which was to smile and to make others smile as well.    Cristi Loron 05/09/2018, 10:11 PM

## 2018-05-09 NOTE — Progress Notes (Signed)
Patient attended grief and loss group facilitated by Jerene Pitch, [place], [place], and Kerry Hough, [place], [facility], [place].   Group focuses on change and loss experienced by group members; topics include loss due to death, loss of relationships, loss of a place, loss of sense of self, etc. Group members are invited to share the changes and losses that are currently affecting their lives. Facilitators tie together shared experiences between group members and validate emotions felt by participants.  Delpha - introduced herself as Banker" - was present throughout group. Did not share during facilitated discussion but appeared engaged as evident by head nodding in agreement with other group members.  Kerry Hough, MS, [facility], [place]

## 2018-05-09 NOTE — Telephone Encounter (Signed)
I left message to our consultation liaison psychiatrist Dr. Buford Dresser to follow-up.

## 2018-05-09 NOTE — H&P (Addendum)
Psychiatric Admission Assessment Adult  Patient Identification: Donna Black MRN:  [id number] Date of Evaluation:  05/09/2018 Chief Complaint:  MDD Principal Diagnosis: <principal problem not specified> Diagnosis:  Active Problems:   Major depressive disorder, recurrent severe without psychotic features ([place])  History of Present Illness: Pt is a 53 year old caucasian female who presents to the hospital after being placed in IVC by her family for concern over her ETOH use and suicidal ideation. Pt lives with her 52 year old son and is recently unemployed after she resigned from her job due to long hours and a long commute to [place] and back every day. Her family placed her under IVC due to their concerns over her ETOH use, suicidal ideations and paranoid ideation. She has been having thoughts that people on TV are watching her and feels as if she is being monitored and that TV characters are communicating with her. She does admit to drinking on the weekend and that she has been dealing with depression but denies any suicidal thoughts or intent. She does state that she has a vague feeling of being watched.  She stated she does not understand why her family placed her under IVC and that she was at home watching a movie. Her admitting BAL was 105, UDS was negative. She has had 2 previous suicide attempts by overdose, the last one was 7 years ago.   She stated her family tries to make her feel bad about herself and for that reason she is trying to distance herself from some family members. She is also separated from her husband. She denies any disturbed sleep, appetite, decrease in energy, homicidal ideation and suicidal ideation. She denies visual hallucinations but stated in the past she has had mild auditory hallucinations in the form of clicking noises. She does endorse paranoia, as above. She was last hospitalized in 2014 for similar reasons of her family being concerned about her being suicidal. She  stated she does not feel she needs to be in the hospital.   She appears pleasant and smiling, calm and cooperative, neatly groomed and makes good eye contact. She is motivated to go home. She is responsible for a child under 10 years of age and would benefit from inpatient stabilization and monitoring of her medications and monitoring for any withdrawal symptoms. She is currently not experiencing any withdrawal symptoms and stated she has never had a withdrawal seizure.   Pt is a patient of Dr Adele Schilder at [facility]. She last saw him 2 weeks ago. She is taking Lamictal, Abilify, and Effexor XR. She stated one of her daughters has HCPOA and is in close contact with Dr Adele Schilder and will call today to let him know she is in the hospital.   Associated Signs/Symptoms: Depression Symptoms:  depressed mood, suicidal thoughts without plan, anxiety, disturbed sleep, (Hypo) Manic Symptoms:  None observed today, admits to a manic episode 10 years ago Anxiety Symptoms:  Social Anxiety,Isolates at home Psychotic Symptoms:  Ideas of Reference, Paranoia, PTSD Symptoms: NA Total Time spent with patient: 45 minutes  Past Psychiatric History: MDD, recurrent, moderate Is the patient at risk to self? Yes.    Has the patient been a risk to self in the past 6 months? Yes.    Has the patient been a risk to self within the distant past? Yes.    Is the patient a risk to others? No.  Has the patient been a risk to others in the past 6 months? No.  Has the patient been a risk to others within the distant past? No.   Prior Inpatient Therapy:  Yes Prior Outpatient Therapy:  Yes  Alcohol Screening: 1. How often do you have a drink containing alcohol?: 2 to 4 times a month 2. How many drinks containing alcohol do you have on a typical day when you are drinking?: 1 or 2 3. How often do you have six or more drinks on one occasion?: Less than monthly AUDIT-C Score: 3 4. How often during the last year  have you found that you were not able to stop drinking once you had started?: Never 5. How often during the last year have you failed to do what was normally expected from you becasue of drinking?: Never 6. How often during the last year have you needed a first drink in the morning to get yourself going after a heavy drinking session?: Never 7. How often during the last year have you had a feeling of guilt of remorse after drinking?: Never 8. How often during the last year have you been unable to remember what happened the night before because you had been drinking?: Never 9. Have you or someone else been injured as a result of your drinking?: No 10. Has a relative or friend or a doctor or another health worker been concerned about your drinking or suggested you cut down?: No Alcohol Use Disorder Identification Test Final Score (AUDIT): 3 Alcohol Brief Interventions/Follow-up: AUDIT Score <7 follow-up not indicated Substance Abuse History in the last 12 months:  Yes.   Consequences of Substance Abuse: Medical Consequences:  Inpatient psychiatric hospital admission Family Consequences:  Familt concern about ETOH use, IVC Previous Psychotropic Medications: No  Psychological Evaluations: No  Past Medical History:  Past Medical History:  Diagnosis Date  . Anemia    when she was younger  . Cancer [facility]) 2013   bilateral breast cancer  . Complication of anesthesia    ?, heart rate dropped w/wisdom teeth  . Depression   . Family history of breast cancer   . Family history of ovarian cancer   . Family history of pancreatic cancer   . GERD (gastroesophageal reflux disease)    with pregnancy only  . GERD (gastroesophageal reflux disease) 02/23/2017  . Grave's disease    diagnosed about 7 yrs ago but after a year she has been doing well  . Heart murmur    told off and on in past that she has had one  . High cholesterol   . Pneumonia   . Wears glasses     Past Surgical History:  Procedure  Laterality Date  . [place], 2000   bilateral  . BREAST RECONSTRUCTION  05/12/2011   Procedure: BREAST RECONSTRUCTION;  Surgeon: Crissie Reese, MD;  Location: [place];  Service: Plastics;  Laterality: Bilateral;  PLACEMENT OF BILATERAL TISSUE EXPANDERS WITH POSSIBLE USE OF HD FLEX  . BREAST RECONSTRUCTION    . CESAREAN SECTION     2001,cyst; 2002 birth  . CHOLECYSTECTOMY    . MASTECTOMY Bilateral   . ORIF ANKLE FRACTURE Right 09/18/2013   Procedure: OPEN REDUCTION INTERNAL FIXATION (ORIF) RIGHT ANKLE FRACTURE;  Surgeon: Mcarthur Rossetti, MD;  Location: [place];  Service: Orthopedics;  Laterality: Right;  . WISDOM TOOTH EXTRACTION     Family History:  Family History  Problem Relation Age of Onset  . Heart disease Mother   . Stroke Mother   . COPD Mother   . Diabetes  Mother   . Heart disease Father   . Lung cancer Father 79  . Colon polyps Father   . Breast cancer Maternal Grandmother 77  . Pancreatic cancer Maternal Grandmother        dx in her 67s  . Ovarian cancer Paternal Aunt        dx in her 19s  . Breast cancer Other        MGM's sister; unilateral breast cancer  . Cancer Other        PGMs mother; either stomach or ovarian cancer   Family Psychiatric  History: Father: Depression, PTSD Tobacco Screening: Have you used any form of tobacco in the last 30 days? (Cigarettes, Smokeless Tobacco, Cigars, and/or Pipes): Yes Tobacco use, Select all that apply: 5 or more cigarettes per day Are you interested in Tobacco Cessation Medications?: No, patient refused Counseled patient on smoking cessation including recognizing danger situations, developing coping skills and basic information about quitting provided: Refused/Declined practical counseling Social History:  Social History   Substance and Sexual Activity  Alcohol Use Yes   Comment: Occasional use with dinner     Social History   Substance and Sexual Activity  Drug Use No    Additional Social  History:    Allergies:   Allergies  Allergen Reactions  . Clindamycin/Lincomycin Anaphylaxis  . Lunesta [Eszopiclone] Other (See Comments)    Causes loss of memory, confusion  . Penicillins Other (See Comments)    Did not work as a child   Lab Results:  Results for orders placed or performed during the hospital encounter of 05/07/18 (from the past 48 hour(s))  CBC with Differential/Platelet     Status: None   Collection Time: 05/07/18  6:37 PM  Result Value Ref Range   WBC 7.1 4.0 - 10.5 K/uL   RBC 4.31 3.87 - 5.11 MIL/uL   Hemoglobin 14.0 12.0 - 15.0 g/dL   HCT 42.7 36.0 - 46.0 %   MCV 99.1 80.0 - 100.0 fL   MCH 32.5 26.0 - 34.0 pg   MCHC 32.8 30.0 - 36.0 g/dL   RDW 13.2 11.5 - 15.5 %   Platelets 292 150 - 400 K/uL   nRBC 0.0 0.0 - 0.2 %   Neutrophils Relative % 55 %   Neutro Abs 3.9 1.7 - 7.7 K/uL   Lymphocytes Relative 27 %   Lymphs Abs 1.9 0.7 - 4.0 K/uL   Monocytes Relative 11 %   Monocytes Absolute 0.8 0.1 - 1.0 K/uL   Eosinophils Relative 6 %   Eosinophils Absolute 0.4 0.0 - 0.5 K/uL   Basophils Relative 1 %   Basophils Absolute 0.1 0.0 - 0.1 K/uL   Immature Granulocytes 0 %   Abs Immature Granulocytes 0.03 0.00 - 0.07 K/uL    Comment: Performed at [facility], [place] [address] [place], [place] [address]  Comprehensive metabolic panel     Status: Abnormal   Collection Time: 05/07/18  6:37 PM  Result Value Ref Range   Sodium 141 135 - 145 mmol/L   Potassium 3.6 3.5 - 5.1 mmol/L   Chloride 108 98 - 111 mmol/L   CO2 21 (L) 22 - 32 mmol/L   Glucose, Bld 98 70 - 99 mg/dL   BUN 14 6 - 20 mg/dL   Creatinine, Ser 0.79 0.44 - 1.00 mg/dL   Calcium 8.8 (L) 8.9 - 10.3 mg/dL   Total Protein 7.4 6.5 - 8.1 g/dL   Albumin 3.8 3.5 - 5.0 g/dL  AST 22 15 - 41 U/L   ALT 27 0 - 44 U/L   Alkaline Phosphatase 61 38 - 126 U/L   Total Bilirubin 0.3 0.3 - 1.2 mg/dL   GFR calc non Af Amer >60 >60 mL/min   GFR calc Af Amer >60 >60 mL/min   Anion gap 12 5 - 15     Comment: Performed at [facility], [place] [address], [place], [place] [address]  Ethanol     Status: Abnormal   Collection Time: 05/07/18  6:37 PM  Result Value Ref Range   Alcohol, Ethyl (B) 105 (H) <10 mg/dL    Comment: (NOTE) Lowest detectable limit for serum alcohol is 10 mg/dL. For medical purposes only. Performed at [facility], [place] [address], [place], [place] [address]   Salicylate level     Status: None   Collection Time: 05/07/18  6:37 PM  Result Value Ref Range   Salicylate Lvl <7.9 2.8 - 30.0 mg/dL    Comment: Performed at [facility] [place] [address] [place], [place] [address]  Acetaminophen level     Status: Abnormal   Collection Time: 05/07/18  6:37 PM  Result Value Ref Range   Acetaminophen (Tylenol), Serum <10 (L) 10 - 30 ug/mL    Comment: (NOTE) Therapeutic concentrations vary significantly. A range of 10-30 ug/mL  may be an effective concentration for many patients. However, some  are best treated at concentrations outside of this range. Acetaminophen concentrations >150 ug/mL at 4 hours after ingestion  and >50 ug/mL at 12 hours after ingestion are often associated with  toxic reactions. Performed at [facility], [place] [address], [place], [place] [address]   I-Stat beta hCG blood, ED ([facility], [facility], [facility] only)     Status: None   Collection Time: 05/07/18  6:41 PM  Result Value Ref Range   I-stat hCG, quantitative <5.0 <5 mIU/mL   Comment 3            Comment:   GEST. AGE      CONC.  (mIU/mL)   <=1 WEEK        5 - 50     2 WEEKS       50 - 500     3 WEEKS       100 - 10,000     4 WEEKS     1,000 - 30,000        FEMALE AND NON-PREGNANT FEMALE:     LESS THAN 5 mIU/mL   Rapid urine drug screen (hospital performed)     Status: None   Collection Time: 05/08/18  7:54 AM  Result Value Ref Range   Opiates NONE DETECTED NONE DETECTED   Cocaine NONE DETECTED NONE DETECTED   Benzodiazepines  NONE DETECTED NONE DETECTED   Amphetamines NONE DETECTED NONE DETECTED   Tetrahydrocannabinol NONE DETECTED NONE DETECTED   Barbiturates NONE DETECTED NONE DETECTED    Comment: (NOTE) DRUG SCREEN FOR MEDICAL PURPOSES ONLY.  IF CONFIRMATION IS NEEDED FOR ANY PURPOSE, NOTIFY LAB WITHIN 5 DAYS. LOWEST DETECTABLE LIMITS FOR URINE DRUG SCREEN Drug Class                     Cutoff (ng/mL) Amphetamine and metabolites    1000 Barbiturate and metabolites    200 Benzodiazepine                 081 Tricyclics and metabolites     300  Opiates and metabolites        300 Cocaine and metabolites        300 THC                            50 Performed at [place] [address] [place], [place] [address]     Blood Alcohol level:  Lab Results  Component Value Date   ETH 105 (H) 05/07/2018   ETH <5 76/81/1572    Metabolic Disorder Labs:  Lab Results  Component Value Date   HGBA1C 5.2 07/12/2016   MPG 105 10/04/2014   No results found for: PROLACTIN Lab Results  Component Value Date   CHOL 232 (H) 10/04/2014   TRIG 94 10/04/2014   HDL 45 10/04/2014   CHOLHDL 5.2 10/04/2014   VLDL 19 10/04/2014   LDLCALC 168 (H) 10/04/2014    Current Medications: Current Facility-Administered Medications  Medication Dose Route Frequency Provider Last Rate Last Dose  . acetaminophen (TYLENOL) tablet 650 mg  650 mg Oral Q4H PRN Patrecia Pour, NP      . alum & mag hydroxide-simeth (MAALOX/MYLANTA) 200-200-20 MG/5ML suspension 30 mL  30 mL Oral Q6H PRN Patrecia Pour, NP      . ARIPiprazole (ABILIFY) tablet 20 mg  20 mg Oral QHS Patrecia Pour, NP   20 mg at 05/08/18 2125  . hydrOXYzine (ATARAX/VISTARIL) tablet 25 mg  25 mg Oral Q6H PRN Cobos, Myer Peer, MD      . lamoTRIgine (LAMICTAL) tablet 200 mg  200 mg Oral Daily Patrecia Pour, NP   200 mg at 05/09/18 1134  . loperamide (IMODIUM) capsule 2-4 mg  2-4 mg Oral PRN Cobos, Myer Peer, MD      . LORazepam (ATIVAN) tablet  1 mg  1 mg Oral Q6H PRN Cobos, Myer Peer, MD      . magnesium hydroxide (MILK OF MAGNESIA) suspension 30 mL  30 mL Oral Daily PRN Patrecia Pour, NP      . multivitamin with minerals tablet 1 tablet  1 tablet Oral Daily Cobos, Myer Peer, MD   1 tablet at 05/09/18 1136  . nicotine (NICODERM CQ - dosed in mg/24 hours) patch 21 mg  21 mg Transdermal Daily Patrecia Pour, NP      . ondansetron (ZOFRAN-ODT) disintegrating tablet 4 mg  4 mg Oral Q6H PRN Cobos, Myer Peer, MD      . pantoprazole (PROTONIX) EC tablet 40 mg  40 mg Oral Daily Patrecia Pour, NP   40 mg at 05/09/18 1134  . pravastatin (PRAVACHOL) tablet 20 mg  20 mg Oral Daily Patrecia Pour, NP   20 mg at 05/09/18 1134  . [START ON 05/10/2018] thiamine (VITAMIN B-1) tablet 100 mg  100 mg Oral Daily Cobos, Fernando A, MD      . venlafaxine XR (EFFEXOR-XR) 24 hr capsule 75 mg  75 mg Oral Q breakfast Patrecia Pour, NP   75 mg at 05/09/18 1134   PTA Medications: Medications Prior to Admission  Medication Sig Dispense Refill Last Dose  . ARIPiprazole (ABILIFY) 20 MG tablet TAKE 1 TABLET BY MOUTH ATBEDTIME 90 tablet 0 05/07/2018 at Unknown time  . esomeprazole (NEXIUM) 40 MG capsule Take 1 capsule (40 mg total) by mouth daily at 12 noon. 3 capsule 0 05/07/2018 at Unknown time  . lamoTRIgine (LAMICTAL) 200 MG tablet Take 1 tablet (200 mg total) by  mouth daily. 90 tablet 0 05/07/2018 at Unknown time  . phentermine 37.5 MG capsule Take 37.5 mg by mouth every morning.   unknown at Unknown time  . pravastatin (PRAVACHOL) 40 MG tablet Take 40 mg by mouth daily.   05/07/2018 at Unknown time  . venlafaxine XR (EFFEXOR-XR) 75 MG 24 hr capsule TAKE 1 CAPSULE BY MOUTH DAILY WITH BREAKFAST 90 capsule 0 05/07/2018 at Unknown time    Musculoskeletal: Strength & Muscle Tone: within normal limits Gait & Station: normal Patient leans: N/A  Psychiatric Specialty Exam: Physical Exam  Constitutional: She is oriented to person, place, and time. She appears  well-developed and well-nourished.  HENT:  Head: Normocephalic.  Respiratory: Effort normal.  Musculoskeletal: Normal range of motion.  Neurological: She is alert and oriented to person, place, and time.  Psychiatric: Her speech is normal and behavior is normal. Judgment and thought content normal. Her mood appears anxious. Cognition and memory are normal. She exhibits a depressed mood.    Review of Systems  Psychiatric/Behavioral: Positive for depression and substance abuse. The patient is nervous/anxious.   All other systems reviewed and are negative.   Blood pressure 121/85, pulse 88, temperature 98.1 F (36.7 C), temperature source Oral, resp. rate 18, height '5\' 5"'$  (1.651 m), weight 89.8 kg.Body mass index is 32.95 kg/m.  General Appearance: Casual, Fairly Groomed and Neat  Eye Contact:  Good  Speech:  Clear and Coherent and Normal Rate  Volume:  Normal  Mood:  Anxious and Depressed  Affect:  Congruent and Depressed  Thought Process:  Coherent, Goal Directed, Linear and Descriptions of Associations: Intact  Orientation:  Full (Time, Place, and Person)  Thought Content:  Ideas of Reference:   Paranoia  Suicidal Thoughts:  Yes.  without intent/plan  Homicidal Thoughts:  No  Memory:  Immediate;   Good Recent;   Good Remote;   Fair  Judgement:  Fair  Insight:  Present  Psychomotor Activity:  Normal  Concentration:  Concentration: Good and Attention Span: Good  Recall:  Good  Fund of Knowledge:  Good  Language:  Good  Akathisia:  No  Handed:  Right  AIMS (if indicated):     Assets:  Research scientist (life sciences) Vocational/Educational  ADL's:  Intact  Cognition:  WNL  Sleep:  Number of Hours: 6.75    Treatment Plan Summary: Daily contact with patient to assess and evaluate symptoms and progress in treatment, Medication management and Plan Pt to be monitored for safety, medication side effects, sleep,  appetite, and safety   Medication management: Continue current medication regimen, which she states has been effective and well tolerated . Lamictal 200 mgrs QDAY , Effexor XR 75 mgrs QDAY, Abilify 20 mgrs QDAY  Ativan PRN as per CIWA protocol to minimize risk of alcohol WDL.  Participate in the therapeutic milieu  Discharge planning ongoing  Observation Level/Precautions:  15 minute checks  Laboratory:  No new labs today  Psychotherapy:  Therapeutic milieu  Medications:  See MAR  Consultations:  TBD  Discharge Concerns:  Safety, Suicide risk, Substance abuse  Estimated LOS:5-7 days  Other:     Physician Treatment Plan for Primary Diagnosis: Major depressive disorder, recurrent severe without psychotic features ([place]) Long Term Goal(s): Improvement in symptoms so as ready for discharge  Short Term Goals: Ability to identify changes in lifestyle to reduce recurrence of condition will improve, Ability to verbalize feelings will improve, Ability to disclose and discuss suicidal ideas, Ability to demonstrate self-control  will improve, Ability to identify and develop effective coping behaviors will improve, Compliance with prescribed medications will improve and Ability to identify triggers associated with substance abuse/mental health issues will improve   I certify that inpatient services furnished can reasonably be expected to improve the patient's condition.    Ethelene Hal, NP 3/17/202011:36 AM   I have discussed case with NP and have met with patient  Agree with NP note and assessment  52, separated, has three children ( youngest is 19, currently with the father), lives with son, currently unemployed ( recently resigned )  . Patient was admitted to hospital under IVC, due to concerns that patient has suicidal ideations, and paranoid ideations, with thoughts that people on TV are watching her, communicating with her , feeling she is being monitored . Patient minimizes above,  states she has been dealing with some depression , but denies any suicidal ideations. She also denies any thoughts that she feels TV characters are communicating with her, although does endorse a vague feeling of being watched. She states " I don't know why " family members generated IVC, but states it may be related to "  I have been trying to limit contact with some of my family members because they like to make me feel bad about myself" Currently denies neuro-vegetative symptoms, and reports sleep, appetite, energy level have been normal, denies anhedonia, denies sadness. Does not endorse any recent  suicidal ideations .  Reports intermittent binge drinking, sometimes up to a bottle of wine per day, but states she can go for long periods of time without drinking . States last drank 2-3 days ago. Admit BAL 105. Denies drug abuse . History of Breast Cancer, in remission x 2 years. History of Hyperthyroidism in the past. States she is now euthyroid , not on thyroid medications. Smokes 1/2 pack of  cigarettes per day.  Home medications- Effexor XR 75 mgrs QDAY, Lamictal 200 mgrs QDAY, Abilify 20 mgrs QDAY . Phentermine listed as home medication- patient states she has not been taking it . History of depression, history of suicide attempts ( x2) , by overdosing . Last time 7 years ago. Marland Kitchen History of prior psychiatric admissions for depression, suicidal ideations. Last admission was in 2016 for MDD. Of note, at the time reported some vague auditory hallucinations- hearing clicking sounds. She states she has been diagnosed with MDD, and also in the past Bipolar Disorder , and does describe one  prior episode of increased spending and impulsivity ( 10 years ago).  Parents deceased, mother died from complications of COPD, father died from lung cancer. Has one brother. Father had history of depression, PTSD from serving in [place] known suicides in family.  Dx- MDD, consider with psychotic symptoms, versus  Bipolar Disorder   Plan- Inpatient admission. Continue current medication regimen, which she states has been effective and well tolerated . Lamictal 200 mgrs QDAY , Effexor XR 75 mgrs QDAY, Abilify 20 mgrs QDAY  Ativan PRN as per CIWA protocol to minimize risk of alcohol WDL.

## 2018-05-09 NOTE — Progress Notes (Signed)
D    Pt is pleasant on approach and cooperative     Pt reports improved mood since hospitalization      She interacts appropriately with others and is active in groups A   Verbal support given   Medications administered and effectiveness monitored   Q 15 min checks R   Pt remains safe at this time

## 2018-05-09 NOTE — BHH Suicide Risk Assessment (Signed)
[facility] Admission Suicide Risk Assessment   Nursing information obtained from:  Patient Demographic factors:  Divorced or widowed, Caucasian Current Mental Status:  NA Loss Factors:  NA Historical Factors:  Prior suicide attempts Risk Reduction Factors:  Responsible for children under 53 years of age, Positive coping skills or problem solving skills  Total Time spent with patient: 45 minutes Principal Problem: <principal problem not specified> Diagnosis:  Active Problems:   Major depressive disorder, recurrent severe without psychotic features ([place])  Subjective Data:   Continued Clinical Symptoms:  Alcohol Use Disorder Identification Test Final Score (AUDIT): 3 The "Alcohol Use Disorders Identification Test", Guidelines for Use in Primary Care, Second Edition.  World Pharmacologist [facility]). Score between 0-7:  no or low risk or alcohol related problems. Score between 8-15:  moderate risk of alcohol related problems. Score between 16-19:  high risk of alcohol related problems. Score 20 or above:  warrants further diagnostic evaluation for alcohol dependence and treatment.   CLINICAL FACTORS:  23, separated, has three children ( youngest is 54, currently with the father), lives with son, currently unemployed ( recently resigned )  . Patient was admitted to hospital under IVC, due to concerns that patient has suicidal ideations, and paranoid ideations, with thoughts that people on TV are watching her, communicating with her , feeling she is being monitored . Patient minimizes above, states she has been dealing with some depression , but denies any suicidal ideations. She also denies any thoughts that she feels TV characters are communicating with her, although does endorse a vague feeling of being watched. She states " I don't know why " family members generated IVC, but states it may be related to "  I have been trying to limit contact with some of my family members because they like to  make me feel bad about myself" Currently denies neuro-vegetative symptoms, and reports sleep, appetite, energy level have been normal, denies anhedonia, denies sadness. Does not endorse any recent  suicidal ideations .  Reports intermittent binge drinking, sometimes up to a bottle of wine per day, but states she can go for long periods of time without drinking . States last drank 2-3 days ago. Admit BAL 105. Denies drug abuse . History of Breast Cancer, in remission x 2 years. History of Hyperthyroidism in the past. States she is now euthyroid , not on thyroid medications. Smokes 1/2 pack of  cigarettes per day.  Home medications- Effexor XR 75 mgrs QDAY, Lamictal 200 mgrs QDAY, Abilify 20 mgrs QDAY . Phentermine listed as home medication- patient states she has not been taking it . History of depression, history of suicide attempts ( x2) , by overdosing . Last time 7 years ago. Marland Kitchen History of prior psychiatric admissions for depression, suicidal ideations. Last admission was in 2016 for MDD. Of note, at the time reported some vague auditory hallucinations- hearing clicking sounds. She states she has been diagnosed with MDD, and also in the past Bipolar Disorder , and does describe one  prior episode of increased spending and impulsivity ( 10 years ago).  Parents deceased, mother died from complications of COPD, father died from lung cancer. Has one brother. Father had history of depression, PTSD from serving in [employer] known suicides in family.  Dx- MDD, consider with psychotic symptoms, versus Bipolar Disorder   Plan- Inpatient admission. Continue current medication regimen, which she states has been effective and well tolerated . Lamictal 200 mgrs QDAY , Effexor XR 75 mgrs QDAY, Abilify 20 mgrs  QDAY  Ativan PRN as per CIWA protocol to minimize risk of alcohol WDL.    Musculoskeletal: Strength & Muscle Tone: within normal limits Gait & Station: normal Patient leans: N/A  Psychiatric Specialty  Exam: Physical Exam  ROS no headache, no chest pain, no shortness of breath, no vomiting, no rash.  Blood pressure 124/84, pulse (!) 101, temperature 98.1 F (36.7 C), temperature source Oral, resp. rate 18, height 5\' 5"  (1.651 m), weight 89.8 kg.Body mass index is 32.95 kg/m.  General Appearance: Well Groomed  Eye Contact:  Good  Speech:  Normal Rate  Volume:  Normal  Mood:  reports feeling depressed recently but today " mood is OK"  Affect:  Appropriate and reactive, vaguely anxious   Thought Process:  Linear and Descriptions of Associations: Intact  Orientation:  Other:  fully alert and attentive  Thought Content:  no hallucinations endorsed and does not appear internally preoccupied , no delusions currently expressed, does report a vague sense that she is being watched  Suicidal Thoughts:  No  Homicidal Thoughts:  No  Memory:  recent and remote grossly intact  Judgement:  Other:  fair   Insight:  fair   Psychomotor Activity:  Normal  Concentration:  Concentration: Good and Attention Span: Good  Recall:  Good  Fund of Knowledge:  Good  Language:  Good  Akathisia:  Negative  Handed:  Right  AIMS (if indicated):     Assets:  Communication Skills Desire for Improvement Physical Health  ADL's:  Intact  Cognition:  WNL  Sleep:  Number of Hours: 6.75      COGNITIVE FEATURES THAT CONTRIBUTE TO RISK:  Closed-mindedness and Loss of executive function    SUICIDE RISK:   Moderate:  Frequent suicidal ideation with limited intensity, and duration, some specificity in terms of plans, no associated intent, good self-control, limited dysphoria/symptomatology, some risk factors present, and identifiable protective factors, including available and accessible social support.  PLAN OF CARE: Patient will be admitted to inpatient psychiatric unit for stabilization and safety. Will provide and encourage milieu participation. Provide medication management and maked adjustments as needed.  Will  follow daily.    I certify that inpatient services furnished can reasonably be expected to improve the patient's condition.   Jenne Campus, MD 05/09/2018, 9:12 AM

## 2018-05-09 NOTE — Plan of Care (Signed)
  Problem: Coping: Goal: Ability to verbalize frustrations and anger appropriately will improve Outcome: Progressing  D: Pt alert and oriented on the unit. Pt engaging with RN staff and other pts and denies SI/HI, A/VH. Pt also participated during unit groups and activities. Pt is pleasant and cooperative. Pt rated her anxiety a 1, feelings of hopelessness and depression a 0, with 10 being the worst. A: Education, support and encouragement provided, q15 minute safety checks remain in effect. Medications administered per MD orders. R: No reactions/side effects to medicine noted. Pt denies any concerns at this time, and verbally contracts for safety. Pt ambulating on the unit with no issues. Pt remains safe on and off the unit.
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
## 2018-05-10 DIAGNOSIS — F333 Major depressive disorder, recurrent, severe with psychotic symptoms: Secondary | ICD-10-CM

## 2018-05-10 NOTE — BHH Group Notes (Signed)
Occupational Therapy Group Note  Date:  05/10/2018 Time:  11:58 AM  Group Topic/Focus:  Self Esteem Action Plan:   The focus of this group is to help patients create a plan to continue to build self-esteem after discharge.  Participation Level:  Active  Participation Quality:  Appropriate  Affect:  Blunted  Cognitive:  Appropriate  Insight: Improving  Engagement in Group:  Engaged  Modes of Intervention:  Activity, Discussion, Education and Socialization  Additional Comments:    S: "Accomplishing goals is a way to increase self esteem"  O: OT tx with focus on self esteem building this date. Education given on definition of self esteem, with both causes of low and high self esteem identified. Activity given for pt to identify a positive/aspiring trait for each letter of the alphabet. Pt to work with peers to help complete activity and build positive thinking.   A: Pt presents to group with blunted affect, engaged and participatory throughout session. Pt very engaged and responsive throughout session. Pt sharing how accomplishing goals is the most effective for her to improve self esteem. Pt offering many other insightful and appropriate responses this date.   P: Education given on self esteem and how to improve this date. Handouts and activities given to help facilitate skills when reintegrating into community.   Zenovia Jarred, MSOT, OTR/L Behavioral Health OT/ Acute Relief OT PHP Office: [place] 05/10/2018, 11:58 AM

## 2018-05-10 NOTE — Progress Notes (Addendum)
[facility] MD Progress Note  05/10/2018 11:28 AM Donna Black  MRN:  [id number] Subjective:  "I'm doing well."  Donna Black participating in group therapy in the dayroom. She reports good mood today. Denies SI or paranoid ideation. Her cousin's wife petitioned for her commitment, with IVC paperwork reporting patient had expressed SI and believed people on television were speaking to her. Patient denies recent history of SI or paranoia, although she reports both have been problems for her in distant past. She also reports occasional binge drinking, with admission BAL 105. Denies withdrawal symptoms at this time.  With patient's expressed consent, collateral information was obtained from daughter Donna Black with patient present. Patient's daughter reports patient has been more irritable recently and not wanting family to visit her. When patient's daughter asked during a phone conversation what patient was doing, she states patient said, "You know what I'm doing. You can see through the tv." Daughter also states she believes patient may have been off medication for a few days. Daughter visited last night and states patient seems to be back to baseline, with no paranoia or mood disturbance observed. Patient does report she had problems with her pharmacy recently and was off medication for about a week, in addition to alcohol use. Med compliant on the unit. No agitated or disruptive behaviors. She is eating and sleeping well.   Principal Problem: Major depressive disorder, recurrent episode, severe (HCC) Diagnosis: Principal Problem:   Major depressive disorder, recurrent episode, severe ([place])  Total Time spent with patient: 20 minutes  Past Psychiatric History: See admission H&P  Past Medical History:  Past Medical History:  Diagnosis Date  . Anemia    when she was younger  . Cancer [facility]) 2013   bilateral breast cancer  . Complication of anesthesia    ?, heart rate dropped w/wisdom teeth  . Depression    . Family history of breast cancer   . Family history of ovarian cancer   . Family history of pancreatic cancer   . GERD (gastroesophageal reflux disease)    with pregnancy only  . GERD (gastroesophageal reflux disease) 02/23/2017  . Grave's disease    diagnosed about 7 yrs ago but after a year she has been doing well  . Heart murmur    told off and on in past that she has had one  . High cholesterol   . Pneumonia   . Wears glasses     Past Surgical History:  Procedure Laterality Date  . [place], 2000   bilateral  . BREAST RECONSTRUCTION  05/12/2011   Procedure: BREAST RECONSTRUCTION;  Surgeon: Crissie Reese, MD;  Location: [place];  Service: Plastics;  Laterality: Bilateral;  PLACEMENT OF BILATERAL TISSUE EXPANDERS WITH POSSIBLE USE OF HD FLEX  . BREAST RECONSTRUCTION    . CESAREAN SECTION     2001,cyst; 2002 birth  . CHOLECYSTECTOMY    . MASTECTOMY Bilateral   . ORIF ANKLE FRACTURE Right 09/18/2013   Procedure: OPEN REDUCTION INTERNAL FIXATION (ORIF) RIGHT ANKLE FRACTURE;  Surgeon: Mcarthur Rossetti, MD;  Location: [place];  Service: Orthopedics;  Laterality: Right;  . WISDOM TOOTH EXTRACTION     Family History:  Family History  Problem Relation Age of Onset  . Heart disease Mother   . Stroke Mother   . COPD Mother   . Diabetes Mother   . Heart disease Father   . Lung cancer Father 61  . Colon polyps Father   . Breast cancer Maternal Grandmother  71  . Pancreatic cancer Maternal Grandmother        dx in her 71s  . Ovarian cancer Paternal Aunt        dx in her 80s  . Breast cancer Other        MGM's sister; unilateral breast cancer  . Cancer Other        PGMs mother; either stomach or ovarian cancer   Family Psychiatric  History: See admission H&P Social History:  Social History   Substance and Sexual Activity  Alcohol Use Yes   Comment: Occasional use with dinner     Social History   Substance and Sexual Activity  Drug Use No     Social History   Socioeconomic History  . Marital status: Married    Spouse name: Not on file  . Number of children: 3  . Years of education: Not on file  . Highest education level: Not on file  Occupational History  . Not on file  Social Needs  . Financial resource strain: Not on file  . Food insecurity:    Worry: Not on file    Inability: Not on file  . Transportation needs:    Medical: Not on file    Non-medical: Not on file  Tobacco Use  . Smoking status: Current Every Day Smoker    Packs/day: 0.75    Years: 6.00    Pack years: 4.50    Types: Cigarettes  . Smokeless tobacco: Never Used  . Tobacco comment: Has been tappering off 1 pill a day   Substance and Sexual Activity  . Alcohol use: Yes    Comment: Occasional use with dinner  . Drug use: No  . Sexual activity: Not Currently    Birth control/protection: Condom  Lifestyle  . Physical activity:    Days per week: Not on file    Minutes per session: Not on file  . Stress: Not on file  Relationships  . Social connections:    Talks on phone: Not on file    Gets together: Not on file    Attends religious service: Not on file    Active member of club or organization: Not on file    Attends meetings of clubs or organizations: Not on file    Relationship status: Not on file  Other Topics Concern  . Not on file  Social History Narrative  . Not on file   Additional Social History:                         Sleep: Good  Appetite:  Good  Current Medications: Current Facility-Administered Medications  Medication Dose Route Frequency Provider Last Rate Last Dose  . acetaminophen (TYLENOL) tablet 650 mg  650 mg Oral Q4H PRN Patrecia Pour, NP      . alum & mag hydroxide-simeth (MAALOX/MYLANTA) 200-200-20 MG/5ML suspension 30 mL  30 mL Oral Q6H PRN Patrecia Pour, NP      . ARIPiprazole (ABILIFY) tablet 20 mg  20 mg Oral QHS Patrecia Pour, NP   20 mg at 05/09/18 2210  . hydrOXYzine (ATARAX/VISTARIL)  tablet 25 mg  25 mg Oral Q6H PRN Cobos, Myer Peer, MD      . lamoTRIgine (LAMICTAL) tablet 200 mg  200 mg Oral Daily Patrecia Pour, NP   200 mg at 05/10/18 0804  . loperamide (IMODIUM) capsule 2-4 mg  2-4 mg Oral PRN Cobos, Myer Peer, MD      .  LORazepam (ATIVAN) tablet 1 mg  1 mg Oral Q6H PRN Cobos, Fernando A, MD      . magnesium hydroxide (MILK OF MAGNESIA) suspension 30 mL  30 mL Oral Daily PRN Patrecia Pour, NP      . multivitamin with minerals tablet 1 tablet  1 tablet Oral Daily Cobos, Myer Peer, MD   1 tablet at 05/10/18 0804  . nicotine (NICODERM CQ - dosed in mg/24 hours) patch 21 mg  21 mg Transdermal Daily Lord, Jamison Y, NP      . ondansetron (ZOFRAN-ODT) disintegrating tablet 4 mg  4 mg Oral Q6H PRN Cobos, Myer Peer, MD      . pantoprazole (PROTONIX) EC tablet 40 mg  40 mg Oral Daily Patrecia Pour, NP   40 mg at 05/10/18 0804  . pravastatin (PRAVACHOL) tablet 20 mg  20 mg Oral Daily Patrecia Pour, NP   20 mg at 05/10/18 9675  . thiamine (VITAMIN B-1) tablet 100 mg  100 mg Oral Daily Cobos, Myer Peer, MD   100 mg at 05/10/18 0804  . venlafaxine XR (EFFEXOR-XR) 24 hr capsule 75 mg  75 mg Oral Q breakfast Patrecia Pour, NP   75 mg at 05/10/18 9163    Lab Results: No results found for this or any previous visit (from the past 48 hour(s)).  Blood Alcohol level:  Lab Results  Component Value Date   ETH 105 (H) 05/07/2018   ETH <5 84/66/5993    Metabolic Disorder Labs: Lab Results  Component Value Date   HGBA1C 5.2 07/12/2016   MPG 105 10/04/2014   No results found for: PROLACTIN Lab Results  Component Value Date   CHOL 232 (H) 10/04/2014   TRIG 94 10/04/2014   HDL 45 10/04/2014   CHOLHDL 5.2 10/04/2014   VLDL 19 10/04/2014   LDLCALC 168 (H) 10/04/2014    Physical Findings: AIMS: Facial and Oral Movements Muscles of Facial Expression: None, normal Lips and Perioral Area: None, normal Jaw: None, normal Tongue: None, normal,Extremity Movements Upper  (arms, wrists, hands, fingers): None, normal Lower (legs, knees, ankles, toes): None, normal, Trunk Movements Neck, shoulders, hips: None, normal, Overall Severity Severity of abnormal movements (highest score from questions above): None, normal Incapacitation due to abnormal movements: None, normal Patient's awareness of abnormal movements (rate only patient's report): No Awareness, Dental Status Current problems with teeth and/or dentures?: No Does patient usually wear dentures?: No  CIWA:  CIWA-Ar Total: 2 COWS:     Musculoskeletal: Strength & Muscle Tone: within normal limits Gait & Station: normal Patient leans: N/A  Psychiatric Specialty Exam: Physical Exam  Nursing note and vitals reviewed. Constitutional: She is oriented to person, place, and time. She appears well-developed and well-nourished.  Cardiovascular: Normal rate.  Respiratory: Effort normal.  Neurological: She is alert and oriented to person, place, and time.    Review of Systems  Constitutional: Negative.   Psychiatric/Behavioral: Positive for depression and substance abuse (ETOH). Negative for hallucinations, memory loss and suicidal ideas. The patient is not nervous/anxious and does not have insomnia.     Blood pressure 126/73, pulse 96, temperature 100 F (37.8 C), temperature source Oral, resp. rate 16, height 5\' 5"  (1.651 m), weight 89.8 kg.Body mass index is 32.95 kg/m.  General Appearance: Casual  Eye Contact:  Good  Speech:  Normal Rate  Volume:  Normal  Mood:  Euthymic  Affect:  Congruent  Thought Process:  Coherent  Orientation:  Full (Time, Place, and Person)  Thought Content:  WDL  Suicidal Thoughts:  No  Homicidal Thoughts:  No  Memory:  Immediate;   Good Recent;   Fair  Judgement:  Intact  Insight:  Fair  Psychomotor Activity:  Normal  Concentration:  Concentration: Good  Recall:  [place] of Knowledge:  Fair  Language:  Good  Akathisia:  No  Handed:  Right  AIMS (if indicated):      Assets:  Communication Skills Desire for Improvement Housing Resilience Social Support  ADL's:  Intact  Cognition:  WNL  Sleep:  Number of Hours: 6.25     Treatment Plan Summary: Daily contact with patient to assess and evaluate symptoms and progress in treatment and Medication management   Continue inpatient hospitalization.  Continue Abilify 20 mg PO QHS for mood/paranoia Continue Lamictal 200 mg PO daily for mood Continue Effexor XR 75 mg PO daily for mood Continue Ativan 1 mg PO Q6HR PRN CIWA>10 Continue Protonix 40 mg PO daily for GERD Continue pravastatin 20 mg PO daily for HLD  Patient will participate in the therapeutic group milieu.  Discharge disposition in progress.   Connye Burkitt, NP 05/10/2018, 11:28 AM  Agree with NP progress note

## 2018-05-10 NOTE — BHH Group Notes (Signed)
[facility] Group Notes:  (Nursing/MHT/Case Management/Adjunct)  Date:  05/10/2018  Time:  4:00 PM  Type of Therapy:  Nurse Education  Participation Level:  Active  Participation Quality:  Appropriate and Attentive  Affect:  Appropriate  Cognitive:  Alert  Insight:  Appropriate  Engagement in Group:  Engaged  Modes of Intervention:  Discussion and Education  Summary of Progress/Problems: pt' were allowed to share positive and negative coping mechanisms and how to access resources in the community.  Donna Black 05/10/2018, 5:50 PM

## 2018-05-10 NOTE — Plan of Care (Signed)
Progress note  D: pt found in the dayroom interacting; pt compliant with medication administration. Pt denies depression/hopelessness/anxiety, rating these all 0/10. Pt denies any physical problems or pain. Pt states she slept well. Pt states her goal for today is to go home. Pt failed to state how she would achieve this. Pt denies si/hi/ah/vh and verbally agrees to approach staff if these become apparent or before harming herself/others while at [facility]. Pt is bright and animated with her interaction with this Probation officer and others.  A: pt provided support and encouragement. Pt given medication per protocol and standing orders. Q54m safety checks implemented and continued.  R: pt safe on the unit. Will continue to monitor.   Pt progressing in the following metrics  Problem: Education: Goal: Knowledge of [place] [facility] Education information/materials will improve Outcome: Progressing Goal: Emotional status will improve Outcome: Progressing Goal: Mental status will improve Outcome: Progressing Goal: Verbalization of understanding the information provided will improve Outcome: Progressing

## 2018-05-10 NOTE — BHH Suicide Risk Assessment (Signed)
[facility] INPATIENT:  Family/Significant Other Suicide Prevention Education  Suicide Prevention Education:   Education Completed;with daughter, Vance Gather [phone]) has been identified by the patient as the family member/significant other with whom the patient will be residing, and identified as the person(s) who will aid the patient in the event of a mental health crisis (suicidal ideations/suicide attempt).  With written consent from the patient, the family member/significant other has been provided the following suicide prevention education, prior to the and/or following the discharge of the patient.  The suicide prevention education provided includes the following:  Suicide risk factors  Suicide prevention and interventions  National Suicide Hotline telephone number  [facility] assessment telephone number  [facility] Emergency Assistance [place] and/or Residential Mobile Crisis Unit telephone number  Request made of family/significant other to:  Remove weapons (e.g., guns, rifles, knives), all items previously/currently identified as safety concern.    Remove drugs/medications (over-the-counter, prescriptions, illicit drugs), all items previously/currently identified as a safety concern.  The family member/significant other verbalizes understanding of the suicide prevention education information provided.  The family member/significant other agrees to remove the items of safety concern listed above.  Patient's daughter does not have any questions or concerns regarding her mother at this time. CSW will continue to follow.    Marylee Floras 05/10/2018, 2:54 PM

## 2018-05-10 NOTE — Tx Team (Signed)
Interdisciplinary Treatment and Diagnostic Plan Update  05/10/2018 Time of Session:  Donna Black MRN: [id number]  Principal Diagnosis: Major depressive disorder, recurrent episode, severe ([place])  Secondary Diagnoses: Principal Problem:   Major depressive disorder, recurrent episode, severe ([place])   Current Medications:  Current Facility-Administered Medications  Medication Dose Route Frequency Provider Last Rate Last Dose  . acetaminophen (TYLENOL) tablet 650 mg  650 mg Oral Q4H PRN Patrecia Pour, NP      . alum & mag hydroxide-simeth (MAALOX/MYLANTA) 200-200-20 MG/5ML suspension 30 mL  30 mL Oral Q6H PRN Patrecia Pour, NP      . ARIPiprazole (ABILIFY) tablet 20 mg  20 mg Oral QHS Patrecia Pour, NP   20 mg at 05/09/18 2210  . hydrOXYzine (ATARAX/VISTARIL) tablet 25 mg  25 mg Oral Q6H PRN Cobos, Myer Peer, MD      . lamoTRIgine (LAMICTAL) tablet 200 mg  200 mg Oral Daily Patrecia Pour, NP   200 mg at 05/10/18 0804  . loperamide (IMODIUM) capsule 2-4 mg  2-4 mg Oral PRN Cobos, Myer Peer, MD      . LORazepam (ATIVAN) tablet 1 mg  1 mg Oral Q6H PRN Cobos, Fernando A, MD      . magnesium hydroxide (MILK OF MAGNESIA) suspension 30 mL  30 mL Oral Daily PRN Patrecia Pour, NP      . multivitamin with minerals tablet 1 tablet  1 tablet Oral Daily Cobos, Myer Peer, MD   1 tablet at 05/10/18 0804  . nicotine (NICODERM CQ - dosed in mg/24 hours) patch 21 mg  21 mg Transdermal Daily Lord, Jamison Y, NP      . ondansetron (ZOFRAN-ODT) disintegrating tablet 4 mg  4 mg Oral Q6H PRN Cobos, Myer Peer, MD      . pantoprazole (PROTONIX) EC tablet 40 mg  40 mg Oral Daily Patrecia Pour, NP   40 mg at 05/10/18 0804  . pravastatin (PRAVACHOL) tablet 20 mg  20 mg Oral Daily Patrecia Pour, NP   20 mg at 05/10/18 4098  . thiamine (VITAMIN B-1) tablet 100 mg  100 mg Oral Daily Cobos, Myer Peer, MD   100 mg at 05/10/18 0804  . venlafaxine XR (EFFEXOR-XR) 24 hr capsule 75 mg  75 mg Oral Q breakfast  Patrecia Pour, NP   75 mg at 05/10/18 1191   PTA Medications: Medications Prior to Admission  Medication Sig Dispense Refill Last Dose  . ARIPiprazole (ABILIFY) 20 MG tablet TAKE 1 TABLET BY MOUTH ATBEDTIME 90 tablet 0 05/07/2018 at Unknown time  . esomeprazole (NEXIUM) 40 MG capsule Take 1 capsule (40 mg total) by mouth daily at 12 noon. 3 capsule 0 05/07/2018 at Unknown time  . lamoTRIgine (LAMICTAL) 200 MG tablet Take 1 tablet (200 mg total) by mouth daily. 90 tablet 0 05/07/2018 at Unknown time  . phentermine 37.5 MG capsule Take 37.5 mg by mouth every morning.   unknown at Unknown time  . pravastatin (PRAVACHOL) 40 MG tablet Take 40 mg by mouth daily.   05/07/2018 at Unknown time  . venlafaxine XR (EFFEXOR-XR) 75 MG 24 hr capsule TAKE 1 CAPSULE BY MOUTH DAILY WITH BREAKFAST 90 capsule 0 05/07/2018 at Unknown time    Patient Stressors: Marital or family conflict  Patient Strengths: Ability for insight Average or above average intelligence Capable of independent living General fund of knowledge Physical Health Supportive family/friends  Treatment Modalities: Medication Management, Group therapy, Case management,  1 to 1  session with clinician, Psychoeducation, Recreational therapy.   Physician Treatment Plan for Primary Diagnosis: Major depressive disorder, recurrent episode, severe ([place]) Long Term Goal(s): Improvement in symptoms so as ready for discharge   Short Term Goals: Ability to identify changes in lifestyle to reduce recurrence of condition will improve Ability to verbalize feelings will improve Ability to disclose and discuss suicidal ideas Ability to demonstrate self-control will improve Ability to identify and develop effective coping behaviors will improve Compliance with prescribed medications will improve Ability to identify triggers associated with substance abuse/mental health issues will improve  Medication Management: Evaluate patient's response, side effects,  and tolerance of medication regimen.  Therapeutic Interventions: 1 to 1 sessions, Unit Group sessions and Medication administration.  Evaluation of Outcomes: Progressing  Physician Treatment Plan for Secondary Diagnosis: Principal Problem:   Major depressive disorder, recurrent episode, severe ([place])  Long Term Goal(s): Improvement in symptoms so as ready for discharge   Short Term Goals: Ability to identify changes in lifestyle to reduce recurrence of condition will improve Ability to verbalize feelings will improve Ability to disclose and discuss suicidal ideas Ability to demonstrate self-control will improve Ability to identify and develop effective coping behaviors will improve Compliance with prescribed medications will improve Ability to identify triggers associated with substance abuse/mental health issues will improve     Medication Management: Evaluate patient's response, side effects, and tolerance of medication regimen.  Therapeutic Interventions: 1 to 1 sessions, Unit Group sessions and Medication administration.  Evaluation of Outcomes: Progressing   RN Treatment Plan for Primary Diagnosis: Major depressive disorder, recurrent episode, severe ([place]) Long Term Goal(s): Knowledge of disease and therapeutic regimen to maintain health will improve  Short Term Goals: Ability to participate in decision making will improve, Ability to verbalize feelings will improve, Ability to disclose and discuss suicidal ideas and Ability to identify and develop effective coping behaviors will improve  Medication Management: RN will administer medications as ordered by provider, will assess and evaluate patient's response and provide education to patient for prescribed medication. RN will report any adverse and/or side effects to prescribing provider.  Therapeutic Interventions: 1 on 1 counseling sessions, Psychoeducation, Medication administration, Evaluate responses to treatment, Monitor  vital signs and CBGs as ordered, Perform/monitor CIWA, COWS, AIMS and Fall Risk screenings as ordered, Perform wound care treatments as ordered.  Evaluation of Outcomes: Progressing   LCSW Treatment Plan for Primary Diagnosis: Major depressive disorder, recurrent episode, severe ([place]) Long Term Goal(s): Safe transition to appropriate next level of care at discharge, Engage patient in therapeutic group addressing interpersonal concerns.  Short Term Goals: Engage patient in aftercare planning with referrals and resources  Therapeutic Interventions: Assess for all discharge needs, 1 to 1 time with Social worker, Explore available resources and support systems, Assess for adequacy in community support network, Educate family and significant other(s) on suicide prevention, Complete Psychosocial Assessment, Interpersonal group therapy.  Evaluation of Outcomes: Progressing   Progress in Treatment: Attending groups: Yes. Participating in groups: Yes. Taking medication as prescribed: Yes. Toleration medication: Yes. Family/Significant other contact made: No, will contact:  the patient's daughter Patient understands diagnosis: Yes. Discussing patient identified problems/goals with staff: Yes. Medical problems stabilized or resolved: Yes. Denies suicidal/homicidal ideation: Yes. Issues/concerns per patient self-inventory: No. Other:   New problem(s) identified: None   New Short Term/Long Term Goal(s)  medication stabilization, elimination of SI thoughts, development of comprehensive mental wellness plan.    Patient Goals:  I don't feel suicidal, I don't feel depressed, I don't want  to hurt anyone  Discharge Plan or Barriers: Patient plans to return home. She plans to continue to follow up with Dr. Adele Schilder for medication management services. CSW will continue to follow and assess for any additional referrals and possible discharge planning.   Reason for Continuation of Hospitalization: None    Estimated Length of Stay: 05/12/2018  Attendees: Patient: 05/10/2018 1:44 PM  Physician: Dr. Neita Garnet, MD 05/10/2018 1:44 PM  Nursing: Legrand Como.Chauncey Cruel, RN 05/10/2018 1:44 PM  RN Care Manager: 05/10/2018 1:44 PM  Social Worker: Theresa Duty 05/10/2018 1:44 PM  Recreational Therapist:  05/10/2018 1:44 PM  Other:  05/10/2018 1:44 PM  Other:  05/10/2018 1:44 PM  Other: 05/10/2018 1:44 PM    Scribe for Treatment Team: Marylee Floras, [place] 05/10/2018 1:44 PM

## 2018-05-11 LAB — LIPID PANEL
CHOLESTEROL: 183 mg/dL (ref 0–200)
HDL: 54 mg/dL (ref >40)
LDL Cholesterol: 112 mg/dL — ABNORMAL HIGH (ref 0–99)
Total CHOL/HDL Ratio: 3.4 RATIO
Triglycerides: 87 mg/dL (ref <150)
VLDL: 17 mg/dL (ref 0–40)

## 2018-05-11 LAB — HEMOGLOBIN A1C
Hgb A1c MFr Bld: 5.2 % (ref 4.8–5.6)
Mean Plasma Glucose: 102.54 mg/dL

## 2018-05-11 MED ORDER — PRAVASTATIN SODIUM 40 MG PO TABS: 40.0000 mg | ORAL_TABLET | Freq: Every day | ORAL | Status: DC

## 2018-05-11 MED ORDER — VENLAFAXINE HCL ER 75 MG PO CP24: 75.0000 mg | ORAL_CAPSULE | Freq: Every day | ORAL | 0 refills | Status: DC

## 2018-05-11 MED ORDER — NICOTINE 21 MG/24HR TD PT24: 21.0000 mg | MEDICATED_PATCH | Freq: Every day | TRANSDERMAL | 0 refills | Status: DC

## 2018-05-11 MED ORDER — ARIPIPRAZOLE 20 MG PO TABS: 20.0000 mg | ORAL_TABLET | Freq: Every day | ORAL | 0 refills | Status: DC

## 2018-05-11 MED ORDER — LAMOTRIGINE 200 MG PO TABS: 200.0000 mg | ORAL_TABLET | Freq: Every day | ORAL | 0 refills | Status: DC

## 2018-05-11 NOTE — Progress Notes (Signed)
  [facility] Adult Case Management Discharge Plan :  Will you be returning to the same living situation after discharge:  Yes,  patient reports she is returning home with her son At discharge, do you have transportation home?: Yes,  patient reports her son is picking her up at discharge Do you have the ability to pay for your medications: No.  Release of information consent forms completed and in the chart;  Patient's signature needed at discharge.  Patient to Follow up at: Follow-up Information    [facility] Follow up on 05/16/2018.   Specialty:  Behavioral Health Why:  Therapy appointment with Romelle Starcher is Tuesday,  3/24 at 11:00a.  Medication management appointment with Dr. Adele Schilder is Wednesday, 4/8 8:40a.  Contact information: [place] [phone]          Next level of care provider has access to [place] and Suicide Prevention discussed: Yes,  with the patient's daughter  Have you used any form of tobacco in the last 30 days? (Cigarettes, Smokeless Tobacco, Cigars, and/or Pipes): Yes  Has patient been referred to the Quitline?: Patient refused referral  Patient has been referred for addiction treatment: [place], LCSWA 05/11/2018, 11:11 AM

## 2018-05-11 NOTE — Plan of Care (Signed)
Patient denies SI HI AVH. Denies physical pain. Patient states she is ready for discharge. Patient is compliant with medications, no side effects noted. Safety is maintained with 15 minute checks as well as environmental checks. Will continue to monitor and provide support.  Problem: Education: Goal: Emotional status will improve Outcome: Adequate for Discharge   Problem: Education: Goal: Mental status will improve Outcome: Adequate for Discharge   Problem: Education: Goal: Verbalization of understanding the information provided will improve Outcome: Adequate for Discharge

## 2018-05-11 NOTE — [facility] Suicide Risk Assessment (Signed)
[facility] Med Discharge Suicide Risk Assessment   Principal Problem: Major depressive disorder, recurrent episode, severe ([place]) Discharge Diagnoses: Principal Problem:   Major depressive disorder, recurrent episode, severe ([place])   Total Time spent with patient: 30 minutes  Musculoskeletal: Strength & Muscle Tone: within normal limits Gait & Station: normal Patient leans: N/A  Psychiatric Specialty Exam: ROS-currently she denies headache, denies chest pain, denies shortness of breath, denies vomiting, no chills.   Blood pressure 134/70, pulse 91, temperature 100 F (37.8 C), temperature source Oral, resp. rate 18, height 5\' 5"  (1.651 m), weight 89.8 kg.Body mass index is 32.95 kg/m.  General Appearance: Well Groomed  Eye Contact::  Good  Speech:  Normal Rate409  Volume:  Normal  Mood:  Improving mood and currently presents euthymic, denies feeling depressed  Affect:  Appropriate and Full Range  Thought Process:  Linear and Descriptions of Associations: Intact  Orientation:  Full (Time, Place, and Person)  Thought Content:  No hallucinations, no delusions, at this time does not present with paranoid or self-referential ideations  Suicidal Thoughts:  No currently denies suicidal or self-injurious ideations, denies any homicidal or violent ideations  Homicidal Thoughts:  No  Memory:  Recent and remote grossly intact  Judgement:  Other:  Improving  Insight:  Improving  Psychomotor Activity:  Normal  Concentration:  Good  Recall:  Good  Fund of Knowledge:Good  Language: Good  Akathisia:  Negative  Handed:  Right  AIMS (if indicated):     Assets:  Communication Skills Desire for Improvement Resilience  Sleep:  Number of Hours: 6.25  Cognition: WNL  ADL's:  Intact   Mental Status Per Nursing Assessment::   On Admission:  NA  Demographic Factors:  53 year old female, separated, 3 children, currently unemployed.  Loss Factors: Unemployment, reports she had been off her psychiatric  medications for about a week prior to admission.  Historical Factors: History of depression, history of prior suicide attempt 7 years ago.  History of prior psychiatric admissions for depression/psychotic symptoms.  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   Risk Reduction Factors:   Sense of responsibility to family, Positive social support and Positive coping skills or problem solving skills  Continued Clinical Symptoms:  At this time patient is alert, attentive, well-groomed, good eye contact, speech normal, mood described as stable/improved.  Presents euthymic.  Affect is appropriate, reactive, full in range.  There is no thought disorder.  She is not suicidal, denies any self-injurious or suicidal ideations, no homicidal or violent ideations, denies hallucinations, does not appear internally preoccupied, no delusions are expressed at this time.  Does not endorse paranoia or self-referential ideations at this time. Presents with improving insight.  States she feels that combination of being off her psychiatric medications for several days and increased recent drinking may have contributed to admission.  States "I intend to take my medications regularly ", and is wanting to remain abstinent from alcohol.  Patient's daughter was contacted with patient's expressed consent and corroborated that patient seems significantly improved/back to baseline.  No disruptive or agitated behaviors on unit, pleasant on approach.    Cognitive Features That Contribute To Risk:  No gross cognitive deficits noted upon discharge. Is alert , attentive, and oriented x 3   Suicide Risk:  Mild:  Suicidal ideation of limited frequency, intensity, duration, and specificity.  There are no identifiable plans, no associated intent, mild dysphoria and related symptoms, good self-control (both objective and subjective assessment), few other risk factors, and identifiable protective factors, including available  and accessible  social support.  Follow-up Information    [facility] Follow up on 05/16/2018.   Specialty:  Behavioral Health Why:  Therapy appointment with Romelle Starcher is Tuesday,  3/24 at 11:00a.  Medication management appointment with Dr. Adele Schilder is Wednesday, 4/8 8:40a.  Contact information: [place] [phone]          Plan Of Care/Follow-up recommendations:  Activity:  As tolerated Diet:  Regular Tests:  NA Other:  See below  Patient is expressing readiness for discharge and is leaving unit in good spirits.  At this time there are no grounds for involuntary commitment.  She is planning to return home, states her daughter will pick her up later today.  She will follow-up with Dr. Adele Schilder and with her therapist.. Also plans to follow-up with her PCP for medical issues as needed.   Jenne Campus, MD 05/11/2018, 10:28 AM

## 2018-05-11 NOTE — Discharge Summary (Addendum)
Physician Discharge Summary Note  Patient:  Donna Black is an 53 y.o., female MRN:  [id number] DOB:  Apr 18, 1965 Patient phone:  [phone] (home)  Patient address:   [address] [place] [address],  Total Time spent with patient: 15 minutes  Date of Admission:  05/08/2018 Date of Discharge: 05/11/2018   Reason for Admission:  Suicidal ideation, paranoia  Principal Problem: Major depressive disorder, recurrent severe without psychotic features ([place]) Discharge Diagnoses: Principal Problem:   Major depressive disorder, recurrent severe without psychotic features ([place])   Past Psychiatric History: Per admission SRA: History of depression, history of suicide attempts ( x2) , by overdosing . Last time 7 years ago. Marland Kitchen History of prior psychiatric admissions for depression, suicidal ideations. Last admission was in 2016 for MDD. Of note, at the time reported some vague auditory hallucinations- hearing clicking sounds. She states she has been diagnosed with MDD, and also in the past Bipolar Disorder , and does describe one  prior episode of increased spending and impulsivity ( 10 years ago). She is a patient of Dr. Adele Schilder at [facility] and last saw him two weeks ago.  Past Medical History:  Past Medical History:  Diagnosis Date  . Anemia    when she was younger  . Cancer [facility]) 2013   bilateral breast cancer  . Complication of anesthesia    ?, heart rate dropped w/wisdom teeth  . Depression   . Family history of breast cancer   . Family history of ovarian cancer   . Family history of pancreatic cancer   . GERD (gastroesophageal reflux disease)    with pregnancy only  . GERD (gastroesophageal reflux disease) 02/23/2017  . Grave's disease    diagnosed about 7 yrs ago but after a year she has been doing well  . Heart murmur    told off and on in past that she has had one  . High cholesterol   . Pneumonia   . Wears glasses     Past Surgical History:  Procedure  Laterality Date  . [place], 2000   bilateral  . BREAST RECONSTRUCTION  05/12/2011   Procedure: BREAST RECONSTRUCTION;  Surgeon: Crissie Reese, MD;  Location: [place];  Service: Plastics;  Laterality: Bilateral;  PLACEMENT OF BILATERAL TISSUE EXPANDERS WITH POSSIBLE USE OF HD FLEX  . BREAST RECONSTRUCTION    . CESAREAN SECTION     2001,cyst; 2002 birth  . CHOLECYSTECTOMY    . MASTECTOMY Bilateral   . ORIF ANKLE FRACTURE Right 09/18/2013   Procedure: OPEN REDUCTION INTERNAL FIXATION (ORIF) RIGHT ANKLE FRACTURE;  Surgeon: Mcarthur Rossetti, MD;  Location: [place];  Service: Orthopedics;  Laterality: Right;  . WISDOM TOOTH EXTRACTION     Family History:  Family History  Problem Relation Age of Onset  . Heart disease Mother   . Stroke Mother   . COPD Mother   . Diabetes Mother   . Heart disease Father   . Lung cancer Father 25  . Colon polyps Father   . Breast cancer Maternal Grandmother 41  . Pancreatic cancer Maternal Grandmother        dx in her 88s  . Ovarian cancer Paternal Aunt        dx in her 81s  . Breast cancer Other        MGM's sister; unilateral breast cancer  . Cancer Other        PGMs mother; either stomach or ovarian cancer   Family  Psychiatric  History: Per admission SRA: Father had history of depression, PTSD from serving in [place] known suicides in family. Social History:  Social History   Substance and Sexual Activity  Alcohol Use Yes   Comment: Occasional use with dinner     Social History   Substance and Sexual Activity  Drug Use No    Social History   Socioeconomic History  . Marital status: Married    Spouse name: Not on file  . Number of children: 3  . Years of education: Not on file  . Highest education level: Not on file  Occupational History  . Not on file  Social Needs  . Financial resource strain: Not on file  . Food insecurity:    Worry: Not on file    Inability: Not on file  . Transportation needs:     Medical: Not on file    Non-medical: Not on file  Tobacco Use  . Smoking status: Current Every Day Smoker    Packs/day: 0.75    Years: 6.00    Pack years: 4.50    Types: Cigarettes  . Smokeless tobacco: Never Used  . Tobacco comment: Has been tappering off 1 pill a day   Substance and Sexual Activity  . Alcohol use: Yes    Comment: Occasional use with dinner  . Drug use: No  . Sexual activity: Not Currently    Birth control/protection: Condom  Lifestyle  . Physical activity:    Days per week: Not on file    Minutes per session: Not on file  . Stress: Not on file  Relationships  . Social connections:    Talks on phone: Not on file    Gets together: Not on file    Attends religious service: Not on file    Active member of club or organization: Not on file    Attends meetings of clubs or organizations: Not on file    Relationship status: Not on file  Other Topics Concern  . Not on file  Social History Narrative  . Not on file    Hospital Course:  From admission SRA 05/09/2018: Patient was admitted to hospital under IVC, due to concerns that patient has suicidal ideations, and paranoid ideations, with thoughts that people on TV are watching her, communicating with her , feeling she is being monitored . Patient minimizes above, states she has been dealing with some depression , but denies any suicidal ideations. She also denies any thoughts that she feels TV characters are communicating with her, although does endorse a vague feeling of being watched. She states " I don't know why " family members generated IVC, but states it may be related to "  I have been trying to limit contact with some of my family members because they like to make me feel bad about myself." Currently denies neuro-vegetative symptoms, and reports sleep, appetite, energy level have been normal, denies anhedonia, denies sadness. Does not endorse any recent  suicidal ideations. Reports intermittent binge drinking,  sometimes up to a bottle of wine per day, but states she can go for long periods of time without drinking . States last drank 2-3 days ago. Admit BAL 105. Denies drug abuse.  Ms. Brisbon was admitted under IVC from her cousin's wife for reported SI and paranoia. Collateral information was obtained from patient's daughter, who confirmed that patient had seemed more irritable and withdrawn recently and that patient had told daughter over the phone "You know what I'm doing. You  can see me through the tv." Patient reported she had been off medications for about a week recently related to problems with her pharmacy refills. She also reported intermittent binge drinking. Abilify, Lamictal, and Effexor were continued. CIWA protocol was ordered but patient showed no signs of alcohol withdrawal, did not require PRN Ativan. She was calm and cooperative throughout admission, showing no signs of paranoia on the unit. Appears euthymic and reports stable mood. She remained on the [facility] unit for 3 days. She stabilized with medication and therapy. She was discharged on the medications listed below. She has shown improvement with improved mood, affect, sleep, appetite, and interaction. She denies any SI/HI/AVH and contracts for safety. She agrees to follow up with Dr. Adele Schilder and therapist Romelle Starcher. She is provided with prescriptions for medications upon discharge. Her son is picking her up for discharge home.  Physical Findings: AIMS: Facial and Oral Movements Muscles of Facial Expression: None, normal Lips and Perioral Area: None, normal Jaw: None, normal Tongue: None, normal,Extremity Movements Upper (arms, wrists, hands, fingers): None, normal Lower (legs, knees, ankles, toes): None, normal, Trunk Movements Neck, shoulders, hips: None, normal, Overall Severity Severity of abnormal movements (highest score from questions above): None, normal Incapacitation due to abnormal movements: None, normal Patient's awareness of  abnormal movements (rate only patient's report): No Awareness, Dental Status Current problems with teeth and/or dentures?: No Does patient usually wear dentures?: No  CIWA:  CIWA-Ar Total: 0 COWS:     Musculoskeletal: Strength & Muscle Tone: within normal limits Gait & Station: normal Patient leans: N/A  Psychiatric Specialty Exam: Physical Exam  Nursing note and vitals reviewed. Constitutional: She is oriented to person, place, and time. She appears well-developed and well-nourished.  Cardiovascular: Normal rate.  Respiratory: Effort normal.  Neurological: She is alert and oriented to person, place, and time.  Psychiatric: Her behavior is normal.    Review of Systems  Constitutional: Negative.   Psychiatric/Behavioral: Positive for substance abuse (ETOH). Negative for depression, hallucinations, memory loss and suicidal ideas. The patient is not nervous/anxious and does not have insomnia.     Blood pressure 134/70, pulse 91, temperature 100 F (37.8 C), temperature source Oral, resp. rate 18, height 5\' 5"  (1.651 m), weight 89.8 kg.Body mass index is 32.95 kg/m.  See MD's discharge SRA     Have you used any form of tobacco in the last 30 days? (Cigarettes, Smokeless Tobacco, Cigars, and/or Pipes): Yes  Has this patient used any form of tobacco in the last 30 days? (Cigarettes, Smokeless Tobacco, Cigars, and/or Pipes) Yes, a prescription for an FDA-approved medication for tobacco cessation was offered at discharge.   Blood Alcohol level:  Lab Results  Component Value Date   ETH 105 (H) 05/07/2018   ETH <5 36/64/4034    Metabolic Disorder Labs:  Lab Results  Component Value Date   HGBA1C 5.2 05/11/2018   MPG 102.54 05/11/2018   MPG 105 10/04/2014   No results found for: PROLACTIN Lab Results  Component Value Date   CHOL 183 05/11/2018   TRIG 87 05/11/2018   HDL 54 05/11/2018   CHOLHDL 3.4 05/11/2018   VLDL 17 05/11/2018   LDLCALC 112 (H) 05/11/2018   LDLCALC 168  (H) 10/04/2014    See Psychiatric Specialty Exam and Suicide Risk Assessment completed by Attending Physician prior to discharge.  Discharge destination:  Home  Is patient on multiple antipsychotic therapies at discharge:  No   Has Patient had three or more failed trials of antipsychotic monotherapy  by history:  No  Recommended Plan for Multiple Antipsychotic Therapies: NA  Discharge Instructions    Discharge instructions   Complete by:  As directed    Patient is instructed to take all prescribed medications as recommended. Report any side effects or adverse reactions to your outpatient psychiatrist. Patient is instructed to abstain from alcohol and illegal drugs while on prescription medications. In the event of worsening symptoms, patient is instructed to call the crisis hotline, 911, or go to the nearest emergency department for evaluation and treatment.     Allergies as of 05/11/2018      Reactions   Clindamycin/lincomycin Anaphylaxis   Lunesta [eszopiclone] Other (See Comments)   Causes loss of memory, confusion   Penicillins Other (See Comments)   Did not work as a child      Medication List    STOP taking these medications   phentermine 37.5 MG capsule     TAKE these medications     Indication  ARIPiprazole 20 MG tablet Commonly known as:  ABILIFY Take 1 tablet (20 mg total) by mouth at bedtime. For mood What changed:    how much to take  how to take this  when to take this  additional instructions  Indication:  Mood   esomeprazole 40 MG capsule Commonly known as:  NexIUM Take 1 capsule (40 mg total) by mouth daily at 12 noon.  Indication:  Gastroesophageal Reflux Disease   lamoTRIgine 200 MG tablet Commonly known as:  LAMICTAL Take 1 tablet (200 mg total) by mouth daily. For mood What changed:  additional instructions  Indication:  Mood   nicotine 21 mg/24hr patch Commonly known as:  NICODERM CQ - dosed in mg/24 hours Place 1 patch (21 mg  total) onto the skin daily. For smoking cessation Start taking on:  May 12, 2018  Indication:  Nicotine Addiction   pravastatin 40 MG tablet Commonly known as:  PRAVACHOL Take 40 mg by mouth daily.  Indication:  High Amount of Fats in the Blood   venlafaxine XR 75 MG 24 hr capsule Commonly known as:  EFFEXOR-XR Take 1 capsule (75 mg total) by mouth daily with breakfast. For mood Start taking on:  May 12, 2018 What changed:    how much to take  how to take this  when to take this  additional instructions  Indication:  Mood      Follow-up [facility] Follow up on 05/16/2018.   Specialty:  Behavioral Health Why:  Therapy appointment with Romelle Starcher is Tuesday,  3/24 at 11:00a.  Medication management appointment with Dr. Adele Schilder is Wednesday, 4/8 8:40a.  Contact information: [place] [phone]          Follow-up recommendations: Activity as tolerated. Diet as recommended by primary care physician. Keep all scheduled follow-up appointments as recommended.   Comments:   Patient is instructed to take all prescribed medications as recommended. Report any side effects or adverse reactions to your outpatient psychiatrist. Patient is instructed to abstain from alcohol and illegal drugs while on prescription medications. In the event of worsening symptoms, patient is instructed to call the crisis hotline, 911, or go to the nearest emergency department for evaluation and treatment.  Signed: Connye Burkitt, NP 05/11/2018, 10:52 AM   Patient seen, Suicide Assessment Completed.  Disposition Plan Reviewed

## 2018-05-11 NOTE — Progress Notes (Signed)
Discharge note: Patient reviewed discharge paperwork with RN including prescriptions, follow up appointments, and lab work. Patient given the opportunity to ask questions. All concerns were addressed. All belongings were returned to patient. Denied SI/HI/AVH. Patient thanked staff for their care while at the hospital.  Patient was discharged to lobby where her son was waiting to pick it up.

## 2018-05-16 ENCOUNTER — Ambulatory Visit (HOSPITAL_COMMUNITY): Payer: No Typology Code available for payment source | Admitting: Psychiatry

## 2018-05-31 ENCOUNTER — Other Ambulatory Visit: Payer: Self-pay

## 2018-05-31 ENCOUNTER — Ambulatory Visit (INDEPENDENT_AMBULATORY_CARE_PROVIDER_SITE_OTHER): Payer: PRIVATE HEALTH INSURANCE | Admitting: Psychiatry

## 2018-05-31 DIAGNOSIS — F1010 Alcohol abuse, uncomplicated: Secondary | ICD-10-CM

## 2018-05-31 DIAGNOSIS — F331 Major depressive disorder, recurrent, moderate: Secondary | ICD-10-CM

## 2018-05-31 MED ORDER — ARIPIPRAZOLE 20 MG PO TABS: 20.0000 mg | ORAL_TABLET | Freq: Every day | ORAL | 0 refills | Status: DC

## 2018-05-31 MED ORDER — LAMOTRIGINE 200 MG PO TABS: 200.0000 mg | ORAL_TABLET | Freq: Every day | ORAL | 0 refills | Status: DC

## 2018-05-31 MED ORDER — VENLAFAXINE HCL ER 75 MG PO CP24: 75.0000 mg | ORAL_CAPSULE | Freq: Every day | ORAL | 0 refills | Status: DC

## 2018-05-31 NOTE — Progress Notes (Signed)
Virtual Visit via Telephone Note  I connected with Donna Black on 05/31/18 at  8:40 AM EDT by telephone and verified that I am speaking with the correct person using two identifiers.   I discussed the limitations, risks, security and privacy concerns of performing an evaluation and management service by telephone and the availability of in person appointments. I also discussed with the patient that there may be a patient responsible charge related to this service. The patient expressed understanding and agreed to proceed.   History of Present Illness: Patient was evaluated through phone session.  She was admitted 2 weeks ago to behavioral health center under IVC due to alcohol intoxication, delusions and paranoia.  Patient admitted she was noncompliant with medication.  She was sad because she lost her job and started drinking.  Her medicine remains the same.  She is feeling better.  She stopped drinking since she left the hospital and resume therapy at [facility].  She realized that she should not have stopped the medication.  She admitted it has always put her in the hospital.  She is able to connect with her daughters and she feels that things are much better.  Now she religiously takes her medication and had a alarm in her phone which reminds her to take the medicine.  She is sleeping good.  She has no tremors, shakes or any EPS.  She denies any suicidal thoughts or homicidal thought.  She is actively looking for a job but understand current situation may not be very helpful.  She is trying to spend more time with her son.  She is pleased that her separated husband [name] is also seeing [name].  She is not sure if she will get back together.  She denies any paranoia, hallucination or any suicidal thoughts.  She reported her energy level is good as she is trying to exercise every day.  She reported no tremors, rash, itching or any shakes.   Past Psychiatric History: Reviewed. Seen in office since  2009 after released from behavioral [place]. Did IOP. H/O depression, paranoid and overdose. H/O inpatient at [facility] in August 2016. Took Prozac, Klonopin, Wellbutrin and trazodone.    Recent Results (from the past 2160 hour(s))  CBC with Differential/Platelet     Status: None   Collection Time: 05/07/18  6:37 PM  Result Value Ref Range   WBC 7.1 4.0 - 10.5 K/uL   RBC 4.31 3.87 - 5.11 MIL/uL   Hemoglobin 14.0 12.0 - 15.0 g/dL   HCT 42.7 36.0 - 46.0 %   MCV 99.1 80.0 - 100.0 fL   MCH 32.5 26.0 - 34.0 pg   MCHC 32.8 30.0 - 36.0 g/dL   RDW 13.2 11.5 - 15.5 %   Platelets 292 150 - 400 K/uL   nRBC 0.0 0.0 - 0.2 %   Neutrophils Relative % 55 %   Neutro Abs 3.9 1.7 - 7.7 K/uL   Lymphocytes Relative 27 %   Lymphs Abs 1.9 0.7 - 4.0 K/uL   Monocytes Relative 11 %   Monocytes Absolute 0.8 0.1 - 1.0 K/uL   Eosinophils Relative 6 %   Eosinophils Absolute 0.4 0.0 - 0.5 K/uL   Basophils Relative 1 %   Basophils Absolute 0.1 0.0 - 0.1 K/uL   Immature Granulocytes 0 %   Abs Immature Granulocytes 0.03 0.00 - 0.07 K/uL    Comment: Performed at [facility], [place] [address] [place], [place] [address]  Comprehensive metabolic panel  Status: Abnormal   Collection Time: 05/07/18  6:37 PM  Result Value Ref Range   Sodium 141 135 - 145 mmol/L   Potassium 3.6 3.5 - 5.1 mmol/L   Chloride 108 98 - 111 mmol/L   CO2 21 (L) 22 - 32 mmol/L   Glucose, Bld 98 70 - 99 mg/dL   BUN 14 6 - 20 mg/dL   Creatinine, Ser 0.79 0.44 - 1.00 mg/dL   Calcium 8.8 (L) 8.9 - 10.3 mg/dL   Total Protein 7.4 6.5 - 8.1 g/dL   Albumin 3.8 3.5 - 5.0 g/dL   AST 22 15 - 41 U/L   ALT 27 0 - 44 U/L   Alkaline Phosphatase 61 38 - 126 U/L   Total Bilirubin 0.3 0.3 - 1.2 mg/dL   GFR calc non Af Amer >60 >60 mL/min   GFR calc Af Amer >60 >60 mL/min   Anion gap 12 5 - 15    Comment: Performed at [facility], [place] [address] [place], [place] [address]  Ethanol     Status: Abnormal    Collection Time: 05/07/18  6:37 PM  Result Value Ref Range   Alcohol, Ethyl (B) 105 (H) <10 mg/dL    Comment: (NOTE) Lowest detectable limit for serum alcohol is 10 mg/dL. For medical purposes only. Performed at [facility], [place] [address], [place], [place] [address]   Salicylate level     Status: None   Collection Time: 05/07/18  6:37 PM  Result Value Ref Range   Salicylate Lvl <2.3 2.8 - 30.0 mg/dL    Comment: Performed at [facility], [place] [address], [place], [place] [address]  Acetaminophen level     Status: Abnormal   Collection Time: 05/07/18  6:37 PM  Result Value Ref Range   Acetaminophen (Tylenol), Serum <10 (L) 10 - 30 ug/mL    Comment: (NOTE) Therapeutic concentrations vary significantly. A range of 10-30 ug/mL  may be an effective concentration for many patients. However, some  are best treated at concentrations outside of this range. Acetaminophen concentrations >150 ug/mL at 4 hours after ingestion  and >50 ug/mL at 12 hours after ingestion are often associated with  toxic reactions. Performed at [facility], [place] [address], [place], [place] [address]   I-Stat beta hCG blood, ED ([facility], [facility], [facility] only)     Status: None   Collection Time: 05/07/18  6:41 PM  Result Value Ref Range   I-stat hCG, quantitative <5.0 <5 mIU/mL   Comment 3            Comment:   GEST. AGE      CONC.  (mIU/mL)   <=1 WEEK        5 - 50     2 WEEKS       50 - 500     3 WEEKS       100 - 10,000     4 WEEKS     1,000 - 30,000        FEMALE AND NON-PREGNANT FEMALE:     LESS THAN 5 mIU/mL   Rapid urine drug screen (hospital performed)     Status: None   Collection Time: 05/08/18  7:54 AM  Result Value Ref Range   Opiates NONE DETECTED NONE DETECTED   Cocaine NONE DETECTED NONE DETECTED   Benzodiazepines NONE DETECTED NONE DETECTED   Amphetamines NONE DETECTED NONE DETECTED   Tetrahydrocannabinol NONE DETECTED NONE DETECTED  Barbiturates NONE DETECTED NONE DETECTED    Comment: (NOTE) DRUG SCREEN FOR MEDICAL PURPOSES ONLY.  IF CONFIRMATION IS NEEDED FOR ANY PURPOSE, NOTIFY LAB WITHIN 5 DAYS. LOWEST DETECTABLE LIMITS FOR URINE DRUG SCREEN Drug Class                     Cutoff (ng/mL) Amphetamine and metabolites    1000 Barbiturate and metabolites    200 Benzodiazepine                 536 Tricyclics and metabolites     300 Opiates and metabolites        300 Cocaine and metabolites        300 THC                            50 Performed at [facility], [place] [address], [place], [place] [address]   Lipid panel     Status: Abnormal   Collection Time: 05/11/18  6:34 AM  Result Value Ref Range   Cholesterol 183 0 - 200 mg/dL   Triglycerides 87 <150 mg/dL   HDL 54 >40 mg/dL   Total CHOL/HDL Ratio 3.4 RATIO   VLDL 17 0 - 40 mg/dL   LDL Cholesterol 112 (H) 0 - 99 mg/dL    Comment:        Total Cholesterol/HDL:CHD Risk Coronary Heart Disease Risk Table                     Men   Women  1/2 Average Risk   3.4   3.3  Average Risk       5.0   4.4  2 X Average Risk   9.6   7.1  3 X Average Risk  23.4   11.0        Use the calculated Patient Ratio above and the CHD Risk Table to determine the patient's CHD Risk.        ATP III CLASSIFICATION (LDL):  <100     mg/dL   Optimal  100-129  mg/dL   Near or Above                    Optimal  130-159  mg/dL   Borderline  160-189  mg/dL   High  >190     mg/dL   Very High Performed at [place] [address] [place], [place] [address]   Hemoglobin A1c     Status: None   Collection Time: 05/11/18  6:34 AM  Result Value Ref Range   Hgb A1c MFr Bld 5.2 4.8 - 5.6 %    Comment: (NOTE) Pre diabetes:          5.7%-6.4% Diabetes:              >6.4% Glycemic control for   <7.0% adults with diabetes    Mean Plasma Glucose 102.54 mg/dL    Comment: Performed at [place] [address] [place], [place] [address]      Observations/Objective: Limited mental status examination done on the phone.  Patient appears pleasant and cooperative on the phone.  She apologized and excepting the mistake that she should not be drinking and off the medication.  She denies any auditory or visual hallucination.  She denies any active or passive suicidal thoughts or homicidal thought.  Her speech is clear, coherent with normal tone  and volume.  There were no flight of ideas or any loose association.  Her attention concentration is okay.  She is alert and oriented x3.  Her cognition is intact.  Her fund of knowledge is adequate.  Her insight and judgment is fair.  Assessment and Plan: Major depressive disorder, recurrent.  Alcohol use.  I reviewed her medication, discharge summary, blood work results and discussed in detail about triggers that caused her decompensated.  She realized that not taking the medication and relapse into drinking did not help her.  She wanted to break the cycle and committed to see therapist at [place].  She does not want to change medication since it is working and helping.  We discussed her blood alcohol level and possible interaction with psychotropic medication and her illness.  I encouraged that if she is not feeling well or if she started to have severe depression then she should call us immediately.  I also encouraged even though outside situation with pandemic virus is difficult to get a job but not to give up and keep trying.  She agreed with the plan.  I will continue Effexor 75 mg daily, Lamictal 200 mg daily and Abilify 40 mg daily.  She do not report any side effects including tremors or shakes.  I will see her in 2 months.  Follow Up Instructions:    I discussed the assessment and treatment plan with the patient. The patient was provided an opportunity to ask questions and all were answered. The patient agreed with the plan and demonstrated an understanding of the instructions.   The  patient was advised to call back or seek an in-person evaluation if the symptoms worsen or if the condition fails to improve as anticipated.  I provided 30 minutes of non-face-to-face time during this encounter.   [name], MD

## 2018-07-31 ENCOUNTER — Ambulatory Visit (HOSPITAL_COMMUNITY): Payer: No Typology Code available for payment source | Admitting: Psychiatry

## 2018-08-01 ENCOUNTER — Telehealth (HOSPITAL_COMMUNITY): Payer: Self-pay | Admitting: Psychiatry

## 2018-08-01 ENCOUNTER — Encounter (HOSPITAL_COMMUNITY): Payer: Self-pay | Admitting: Psychiatry

## 2018-08-01 ENCOUNTER — Other Ambulatory Visit: Payer: Self-pay

## 2018-08-01 ENCOUNTER — Ambulatory Visit (INDEPENDENT_AMBULATORY_CARE_PROVIDER_SITE_OTHER): Payer: No Typology Code available for payment source | Admitting: Psychiatry

## 2018-08-01 DIAGNOSIS — F1010 Alcohol abuse, uncomplicated: Secondary | ICD-10-CM

## 2018-08-01 DIAGNOSIS — F331 Major depressive disorder, recurrent, moderate: Secondary | ICD-10-CM

## 2018-08-01 MED ORDER — ARIPIPRAZOLE 20 MG PO TABS: 20.0000 mg | ORAL_TABLET | Freq: Every day | ORAL | 0 refills | Status: DC

## 2018-08-01 MED ORDER — NALTREXONE HCL 50 MG PO TABS: ORAL_TABLET | ORAL | 1 refills | Status: DC

## 2018-08-01 MED ORDER — VENLAFAXINE HCL ER 75 MG PO CP24: 75.0000 mg | ORAL_CAPSULE | Freq: Every day | ORAL | 0 refills | Status: DC

## 2018-08-01 MED ORDER — LAMOTRIGINE 200 MG PO TABS: 200.0000 mg | ORAL_TABLET | Freq: Every day | ORAL | 0 refills | Status: DC

## 2018-08-01 NOTE — Telephone Encounter (Signed)
Patient called again I like to discuss about her drinking.  Now she agreed to try something to help her alcohol craving.  We discussed to try naltrexone which can help her craving.  She agreed to give a chance.  We will call naltrexone 50 mg daily however recommended to take half tablet for few days.  Discussed side effects in detail.  I recommend to call us back if she has any issues, concerns side effects or if she feels the naltrexone not helping her.  She agreed with the plan.

## 2018-08-01 NOTE — Progress Notes (Addendum)
Virtual Visit via Telephone Note  I connected with Donna Black on 08/01/18 at  9:40 AM EDT by telephone and verified that I am speaking with the correct person using two identifiers.   I discussed the limitations, risks, security and privacy concerns of performing an evaluation and management service by telephone and the availability of in person appointments. I also discussed with the patient that there may be a patient responsible charge related to this service. The patient expressed understanding and agreed to proceed.   History of Present Illness: Patient was evaluated by phone session.  Today she is sad and dysphoric because she is not sure about the future with Donna Black.  She told that she has to make important decisions about her life.  She had a started seeing a therapist with Donna Black but now she is not sure if she like to continue.  She has upcoming appointment in few days which she like to keep it.  She admitted relapse into drinking but did not provide much detail as she felt that it is not out of control.  She denies any intoxication, binge, blackouts but feel that her anxiety is high.  She denies any suicidal thoughts or homicidal thought.  She denies any anger, mood swing, highs and lows, delusion or paranoia.  She is in touch with her son Donna Black and her twin daughters on a regular basis.  Her energy level is fair.  She is sleeping 6 to 7 hours.  She admitted some time emotional and crying spells but denies any feeling of hopelessness or worthlessness.  She has not started looking for a job and she realized that she need to keep herself busy.  She denies any rash, itching tremors or shakes.  She endorsed that she is taking the medication religiously he is helping her anxiety and depression and does not want to change the dose or add something else.      Past Psychiatric History:Reviewed. Seen in office since 2009 after released from behavioral [place]. Did IOP. H/O  depression,paranoidandoverdose.H/O inpatient at [facility] in August 2016. Took Prozac, Klonopin, Wellbutrin and trazodone.    Psychiatric Specialty Exam: Physical Exam  ROS  There were no vitals taken for this visit.There is no height or weight on file to calculate BMI.  General Appearance: NA  Eye Contact:  NA  Speech:  Clear and Coherent  Volume:  Normal  Mood:  Dysphoric  Affect:  NA  Thought Process:  Goal Directed  Orientation:  Full (Time, Place, and Person)  Thought Content:  Rumination  Suicidal Thoughts:  No  Homicidal Thoughts:  No  Memory:  Immediate;   Good Recent;   Good Remote;   Good  Judgement:  Fair  Insight:  Fair  Psychomotor Activity:  NA  Concentration:  Concentration: Fair and Attention Span: Fair  Recall:  Good  Fund of Knowledge:  Good  Language:  Good  Akathisia:  No  Handed:  Right  AIMS (if indicated):     Assets:  Communication Skills Desire for Improvement Housing Transportation  ADL's:  Intact  Cognition:  WNL  Sleep:   fair      Assessment and Plan: Major depressive disorder, recurrent.  Alcohol use.  Discussed her recent stressors which causes increased anxiety and sadness.  She is not sure about the future with Donna Black and like to make a decision about not continue therapy.  However she like to at least keep the last appointment with [name] at [facility].  We  talked about continued use of drinking causing interaction with psychotropic medication and not helping her depression.  She realized and she promised that she will quit drinking and start looking for a job.  She also promised that if she started to have any bad thoughts, worsening of drinking or having some suicidal thoughts or homicidal thought then she will call us immediately.  She like to keep her current medication at present dose.  She is tolerating very well.  I also offer CD IOP/IOP but at this time patient is not interested.  I will continue Lamictal 200 mg daily, Abilify 20  mg daily and Effexor XR 75 mg daily.  Discussed medication side effects and benefits.  Recommended to call us back if she has any question, concern or worsening of the symptom.  Follow-up in 2 months.  Follow Up Instructions:    I discussed the assessment and treatment plan with the patient. The patient was provided an opportunity to ask questions and all were answered. The patient agreed with the plan and demonstrated an understanding of the instructions.   The patient was advised to call back or seek an in-person evaluation if the symptoms worsen or if the condition fails to improve as anticipated.  I provided 20 minutes of non-face-to-face time during this encounter.   [name], MD

## 2018-08-03 ENCOUNTER — Telehealth (HOSPITAL_COMMUNITY): Payer: Self-pay

## 2018-08-03 NOTE — Telephone Encounter (Signed)
Patients daughter called, she is concerned about her mother. She states patient is paranoid and delusional. She is not sure if patient is taking medication. I called patient but got voicemail and it was full. Patient called me back a few minutes later - she states she is fine, she has not yet started the Naltrexone. Please review and advise, thank you

## 2018-08-03 NOTE — Telephone Encounter (Signed)
She is on Abilify. Will she try Abilify Injection?

## 2018-08-23 ENCOUNTER — Other Ambulatory Visit: Payer: Self-pay | Admitting: Emergency Medicine

## 2018-09-29 ENCOUNTER — Encounter (HOSPITAL_COMMUNITY): Payer: Self-pay | Admitting: Psychiatry

## 2018-09-29 ENCOUNTER — Ambulatory Visit (INDEPENDENT_AMBULATORY_CARE_PROVIDER_SITE_OTHER): Payer: Self-pay | Admitting: Psychiatry

## 2018-09-29 ENCOUNTER — Other Ambulatory Visit: Payer: Self-pay

## 2018-09-29 DIAGNOSIS — F1010 Alcohol abuse, uncomplicated: Secondary | ICD-10-CM

## 2018-09-29 DIAGNOSIS — F331 Major depressive disorder, recurrent, moderate: Secondary | ICD-10-CM

## 2018-09-29 DIAGNOSIS — M170 Bilateral primary osteoarthritis of knee: Secondary | ICD-10-CM

## 2018-09-29 MED ORDER — LAMOTRIGINE 200 MG PO TABS: 200.0000 mg | ORAL_TABLET | Freq: Every day | ORAL | 0 refills | Status: DC

## 2018-09-29 MED ORDER — VENLAFAXINE HCL ER 75 MG PO CP24: 75.0000 mg | ORAL_CAPSULE | Freq: Every day | ORAL | 0 refills | Status: DC

## 2018-09-29 MED ORDER — ARIPIPRAZOLE 20 MG PO TABS: 20.0000 mg | ORAL_TABLET | Freq: Every day | ORAL | 0 refills | Status: DC

## 2018-09-29 NOTE — Progress Notes (Signed)
Virtual Visit via Telephone Note  I connected with Donna Black on 09/29/18 at 10:00 AM EDT by telephone and verified that I am speaking with the correct person using two identifiers.   I discussed the limitations, risks, security and privacy concerns of performing an evaluation and management service by telephone and the availability of in person appointments. I also discussed with the patient that there may be a patient responsible charge related to this service. The patient expressed understanding and agreed to proceed.   History of Present Illness: Patient was evaluated through phone session.  She is feeling much better today.  She told that she has been not drinking since the last time she talked to [name].  She has not started naltrexone because she wanted to stop the drinking on her own.  So far she feels things are going very well.  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  She is a still with therapist [name] with her ex-husband Donna Black but realize it is just for improved communication for the hospital as plan is not to go back together.  She accepted the separation and she has more confidence in her own.  She is sleeping better.  She denies any paranoia, irritability, anger, mania or any psychosis.  Recently she had a safety plan which she written with the help of her family member which includes her cousins, daughter, Donna Black and her therapist that in case her family member seek change in her behavior then they can contact the therapist and her psychiatrist.  She has not executed the plan yet but she like to in few days.  Patient feels that she is doing much better and the medicine is working.  She has no tremors, shakes or any EPS.  She is very hopeful with her new consulting business.  Her relationship with Donna Black is much better.  She also in touch with her daughter who are very supportive.   Past Psychiatric  History:Reviewed. Seen in office since 2009 after released from behavioral [place]. Did IOP. H/Odepression,paranoidandoverdose.H/O inpatient at [facility] August 2016. TookProzac, Klonopin, Wellbutrin andtrazodone.    Psychiatric Specialty Exam: Physical Exam  ROS  There were no vitals taken for this visit.There is no height or weight on file to calculate BMI.  General Appearance: NA  Eye Contact:  NA  Speech:  Clear and Coherent and Normal Rate  Volume:  Normal  Mood:  Euthymic  Affect:  NA  Thought Process:  Goal Directed  Orientation:  Full (Time, Place, and Person)  Thought Content:  WDL  Suicidal Thoughts:  No  Homicidal Thoughts:  No  Memory:  Immediate;   Good Recent;   Good Remote;   Good  Judgement:  Fair  Insight:  Fair  Psychomotor Activity:  NA  Concentration:  Concentration: Fair and Attention Span: Fair  Recall:  Good  Fund of Knowledge:  Good  Language:  Good  Akathisia:  No  Handed:  Right  AIMS (if indicated):     Assets:  Communication Skills Desire for Improvement Housing Resilience Social Support  ADL's:  Intact  Cognition:  WNL  Sleep:         Assessment and Plan: Major depressive disorder, recurrent.  Alcohol use.  I talked about her current medication and history of drinking.  She realized that she need to stop drinking and she is doing much better since not drinking.  We discussed her  safety plan that she formulated and I encouraged she should add her drinking issues in the plan and had a signed copy sent to [name].  She agreed with the plan.  She does not want to change medication.  She is not taking naltrexone and Chantix since she is trying to stop on her own.  She is still working on her smoking but she feels proud that she is no longer drinking.  She has no side effects of the medication.  She has no rash or itching.  Continue Lamictal 200 mg daily, Abilify 20 mg daily and Effexor XR 75 mg daily.  We talked about Abilify injection  but she needs some time to think about it.  We will discuss switching to injection on her next appointment.  Recommended to call us back if she has any question or any concern.  Follow-up in 3 months.  Time spent 30 minutes.  More than 50% of the time spent in her diagnosis, psychoeducation, long-term prognosis and relapse prevention triggers.  Follow Up Instructions:    I discussed the assessment and treatment plan with the patient. The patient was provided an opportunity to ask questions and all were answered. The patient agreed with the plan and demonstrated an understanding of the instructions.   The patient was advised to call back or seek an in-person evaluation if the symptoms worsen or if the condition fails to improve as anticipated.  I provided 30 minutes of non-face-to-face time during this encounter.   [name], MD

## 2018-11-16 ENCOUNTER — Telehealth (HOSPITAL_COMMUNITY): Payer: Self-pay

## 2018-11-16 NOTE — Telephone Encounter (Signed)
Did she stopped drinking?.  If she is a still drinking then she should consider naltrexone.  I have discussed with her in the past to try Abilify monthly injection and she supposed to call us back about her plan.  If she is stopped drinking then we can consider Lamictal to take from 200 mg a day to 250 mg a day.  She need to watch carefully rash.

## 2018-11-16 NOTE — Telephone Encounter (Signed)
Patient would like to know if she can increase her Lamictal. Please review and advise, thank you

## 2019-01-01 ENCOUNTER — Encounter (HOSPITAL_COMMUNITY): Payer: Self-pay | Admitting: Psychiatry

## 2019-01-01 ENCOUNTER — Other Ambulatory Visit: Payer: Self-pay

## 2019-01-01 ENCOUNTER — Ambulatory Visit (INDEPENDENT_AMBULATORY_CARE_PROVIDER_SITE_OTHER): Payer: Self-pay | Admitting: Psychiatry

## 2019-01-01 DIAGNOSIS — F331 Major depressive disorder, recurrent, moderate: Secondary | ICD-10-CM

## 2019-01-01 DIAGNOSIS — F1010 Alcohol abuse, uncomplicated: Secondary | ICD-10-CM

## 2019-01-01 MED ORDER — VENLAFAXINE HCL ER 75 MG PO CP24: 75.0000 mg | ORAL_CAPSULE | Freq: Every day | ORAL | 0 refills | Status: DC

## 2019-01-01 MED ORDER — LAMOTRIGINE 200 MG PO TABS: 200.0000 mg | ORAL_TABLET | Freq: Every day | ORAL | 0 refills | Status: DC

## 2019-01-01 MED ORDER — ARIPIPRAZOLE 20 MG PO TABS: 20.0000 mg | ORAL_TABLET | Freq: Every day | ORAL | 0 refills | Status: DC

## 2019-01-01 NOTE — Progress Notes (Signed)
Virtual Visit via Telephone Note  I connected with Donna Black on 01/01/19 at 10:00 AM EST by telephone and verified that I am speaking with the correct person using two identifiers.   I discussed the limitations, risks, security and privacy concerns of performing an evaluation and management service by telephone and the availability of in person appointments. I also discussed with the patient that there may be a patient responsible charge related to this service. The patient expressed understanding and agreed to proceed.   History of Present Illness: Patient was evaluated through phone session.  She is doing much better.  Previous ago she called wondering if she can increase the Lamictal but now she started going to gym with her exmother-in-law and she is feeling much better.  She lost 5 pounds since she started the [employer].  She goes 4 times a week.  She has a very good relationship with her exmother-in-law.  She also had a good time as she was visiting her daughter and had a very good but they for her 61 year old son.  She feels proud that she is not drinking as much and her last drink was Saturday but she did not finish the glass.  Patient admitted that she has no more craving for alcohol.  She is drinking unsweetened ice tea and keeping herself hydrated.  She denies any irritability, anger, mania, psychosis or any paranoia.  She is tolerating her medication without any side effects.  She is not taking Chantix and naltrexone however admitted taking phentermine but like to stop after 1 month.  She has no tremors, shakes or any EPS.  She feels the current medicine is working.  Her relationship with the [name] ex-husband is doing well but she realized that they will not live together anymore.  She wanted to keep a relationship healthy.     Past Psychiatric History:Reviewed. Since 2009 seeing in office after released from [facility]. Did IOP. H/Odepression,paranoidandoverdose.H/O inpatient at  [facility] August 2016. TookProzac, Klonopin, Wellbutrin andtrazodone.   Psychiatric Specialty Exam: Physical Exam  ROS  There were no vitals taken for this visit.There is no height or weight on file to calculate BMI.  General Appearance: NA  Eye Contact:  NA  Speech:  Clear and Coherent and Normal Rate  Volume:  Normal  Mood:  Euthymic  Affect:  NA  Thought Process:  Goal Directed  Orientation:  Full (Time, Place, and Person)  Thought Content:  WDL  Suicidal Thoughts:  No  Homicidal Thoughts:  No  Memory:  Immediate;   Good Recent;   Good Remote;   Good  Judgement:  Good  Insight:  Present  Psychomotor Activity:  NA  Concentration:  Concentration: Good and Attention Span: Good  Recall:  Good  Fund of Knowledge:  Good  Language:  Good  Akathisia:  No  Handed:  Right  AIMS (if indicated):     Assets:  Communication Skills Desire for Improvement Financial Resources/Insurance Housing Resilience  ADL's:  Intact  Cognition:  WNL  Sleep:   much better      Assessment and Plan: Major depressive disorder, recurrent.  Alcohol use and mild.  Patient doing better since she started [employer] 4 times a week.  She lost 5 pounds.  She like to continue Lamictal 200 mg daily, Abilify 20 mg daily and Effexor XR 75 mg daily.  She is not interested in Abilify injection since she is taking oral on a regular basis.  Encouraged to continue exercise watch her  calorie intake and her current medication.  Discussed medication side effects and benefits.  Recommended to call us back if she has any question of any concern.  Follow-up in 3 months.  Follow Up Instructions:    I discussed the assessment and treatment plan with the patient. The patient was provided an opportunity to ask questions and all were answered. The patient agreed with the plan and demonstrated an understanding of the instructions.   The patient was advised to call back or seek an in-person evaluation if the symptoms  worsen or if the condition fails to improve as anticipated.  I provided 20 minutes of non-face-to-face time during this encounter.   [name], MD

## 2019-02-27 ENCOUNTER — Inpatient Hospital Stay (HOSPITAL_COMMUNITY)
Admission: EM | Admit: 2019-02-27 | Discharge: 2019-03-26 | DRG: 956 | Disposition: A | Discharge status: Home Health | Payer: No Typology Code available for payment source | Attending: Surgery | Admitting: Surgery

## 2019-02-27 ENCOUNTER — Telehealth (HOSPITAL_COMMUNITY): Payer: Self-pay | Admitting: *Deleted

## 2019-02-27 ENCOUNTER — Emergency Department (HOSPITAL_COMMUNITY): Payer: No Typology Code available for payment source

## 2019-02-27 ENCOUNTER — Inpatient Hospital Stay (HOSPITAL_COMMUNITY): Payer: No Typology Code available for payment source

## 2019-02-27 ENCOUNTER — Other Ambulatory Visit: Payer: Self-pay

## 2019-02-27 ENCOUNTER — Encounter (HOSPITAL_COMMUNITY): Payer: Self-pay | Admitting: Emergency Medicine

## 2019-02-27 ENCOUNTER — Encounter (HOSPITAL_COMMUNITY): Payer: Self-pay | Admitting: *Deleted

## 2019-02-27 DIAGNOSIS — Z853 Personal history of malignant neoplasm of breast: Secondary | ICD-10-CM | POA: Diagnosis not present

## 2019-02-27 DIAGNOSIS — F3131 Bipolar disorder, current episode depressed, mild: Secondary | ICD-10-CM | POA: Diagnosis present

## 2019-02-27 DIAGNOSIS — R0902 Hypoxemia: Secondary | ICD-10-CM

## 2019-02-27 DIAGNOSIS — J449 Chronic obstructive pulmonary disease, unspecified: Secondary | ICD-10-CM | POA: Diagnosis present

## 2019-02-27 DIAGNOSIS — S270XXA Traumatic pneumothorax, initial encounter: Secondary | ICD-10-CM | POA: Diagnosis present

## 2019-02-27 DIAGNOSIS — F1010 Alcohol abuse, uncomplicated: Secondary | ICD-10-CM | POA: Diagnosis present

## 2019-02-27 DIAGNOSIS — F331 Major depressive disorder, recurrent, moderate: Secondary | ICD-10-CM | POA: Diagnosis present

## 2019-02-27 DIAGNOSIS — M25552 Pain in left hip: Secondary | ICD-10-CM | POA: Diagnosis present

## 2019-02-27 DIAGNOSIS — R339 Retention of urine, unspecified: Secondary | ICD-10-CM | POA: Diagnosis not present

## 2019-02-27 DIAGNOSIS — E872 Acidosis: Secondary | ICD-10-CM | POA: Diagnosis present

## 2019-02-27 DIAGNOSIS — S82871B Displaced pilon fracture of right tibia, initial encounter for open fracture type I or II: Secondary | ICD-10-CM | POA: Diagnosis present

## 2019-02-27 DIAGNOSIS — S92321A Displaced fracture of second metatarsal bone, right foot, initial encounter for closed fracture: Secondary | ICD-10-CM | POA: Diagnosis present

## 2019-02-27 DIAGNOSIS — S92901A Unspecified fracture of right foot, initial encounter for closed fracture: Secondary | ICD-10-CM

## 2019-02-27 DIAGNOSIS — S2220XA Unspecified fracture of sternum, initial encounter for closed fracture: Secondary | ICD-10-CM | POA: Diagnosis present

## 2019-02-27 DIAGNOSIS — S72002A Fracture of unspecified part of neck of left femur, initial encounter for closed fracture: Secondary | ICD-10-CM

## 2019-02-27 DIAGNOSIS — S2241XA Multiple fractures of ribs, right side, initial encounter for closed fracture: Secondary | ICD-10-CM | POA: Diagnosis present

## 2019-02-27 DIAGNOSIS — D62 Acute posthemorrhagic anemia: Secondary | ICD-10-CM | POA: Diagnosis not present

## 2019-02-27 DIAGNOSIS — S72092A Other fracture of head and neck of left femur, initial encounter for closed fracture: Secondary | ICD-10-CM | POA: Diagnosis present

## 2019-02-27 DIAGNOSIS — Z20822 Contact with and (suspected) exposure to covid-19: Secondary | ICD-10-CM | POA: Diagnosis present

## 2019-02-27 DIAGNOSIS — T1491XA Suicide attempt, initial encounter: Secondary | ICD-10-CM

## 2019-02-27 DIAGNOSIS — S92331A Displaced fracture of third metatarsal bone, right foot, initial encounter for closed fracture: Secondary | ICD-10-CM | POA: Diagnosis present

## 2019-02-27 DIAGNOSIS — F17210 Nicotine dependence, cigarettes, uncomplicated: Secondary | ICD-10-CM | POA: Diagnosis present

## 2019-02-27 DIAGNOSIS — Z79899 Other long term (current) drug therapy: Secondary | ICD-10-CM

## 2019-02-27 DIAGNOSIS — S92351B Displaced fracture of fifth metatarsal bone, right foot, initial encounter for open fracture: Secondary | ICD-10-CM | POA: Diagnosis present

## 2019-02-27 DIAGNOSIS — S82841B Displaced bimalleolar fracture of right lower leg, initial encounter for open fracture type I or II: Secondary | ICD-10-CM | POA: Diagnosis present

## 2019-02-27 DIAGNOSIS — J939 Pneumothorax, unspecified: Secondary | ICD-10-CM

## 2019-02-27 DIAGNOSIS — T1490XA Injury, unspecified, initial encounter: Secondary | ICD-10-CM

## 2019-02-27 DIAGNOSIS — S72352A Displaced comminuted fracture of shaft of left femur, initial encounter for closed fracture: Secondary | ICD-10-CM | POA: Diagnosis present

## 2019-02-27 DIAGNOSIS — S7292XA Unspecified fracture of left femur, initial encounter for closed fracture: Secondary | ICD-10-CM

## 2019-02-27 DIAGNOSIS — T794XXA Traumatic shock, initial encounter: Secondary | ICD-10-CM | POA: Diagnosis present

## 2019-02-27 DIAGNOSIS — T148XXA Other injury of unspecified body region, initial encounter: Secondary | ICD-10-CM

## 2019-02-27 DIAGNOSIS — S86311A Strain of muscle(s) and tendon(s) of peroneal muscle group at lower leg level, right leg, initial encounter: Secondary | ICD-10-CM

## 2019-02-27 DIAGNOSIS — Y92410 Unspecified street and highway as the place of occurrence of the external cause: Secondary | ICD-10-CM | POA: Diagnosis not present

## 2019-02-27 DIAGNOSIS — F329 Major depressive disorder, single episode, unspecified: Secondary | ICD-10-CM

## 2019-02-27 DIAGNOSIS — E876 Hypokalemia: Secondary | ICD-10-CM | POA: Diagnosis present

## 2019-02-27 DIAGNOSIS — S93324A Dislocation of tarsometatarsal joint of right foot, initial encounter: Secondary | ICD-10-CM

## 2019-02-27 DIAGNOSIS — R45851 Suicidal ideations: Secondary | ICD-10-CM | POA: Diagnosis present

## 2019-02-27 DIAGNOSIS — S82891B Other fracture of right lower leg, initial encounter for open fracture type I or II: Secondary | ICD-10-CM

## 2019-02-27 DIAGNOSIS — I10 Essential (primary) hypertension: Secondary | ICD-10-CM | POA: Diagnosis present

## 2019-02-27 DIAGNOSIS — S81812A Laceration without foreign body, left lower leg, initial encounter: Secondary | ICD-10-CM

## 2019-02-27 DIAGNOSIS — S82871C Displaced pilon fracture of right tibia, initial encounter for open fracture type IIIA, IIIB, or IIIC: Secondary | ICD-10-CM | POA: Insufficient documentation

## 2019-02-27 DIAGNOSIS — Z419 Encounter for procedure for purposes other than remedying health state, unspecified: Secondary | ICD-10-CM

## 2019-02-27 HISTORY — DX: Essential (primary) hypertension: I10

## 2019-02-27 HISTORY — DX: Chronic obstructive pulmonary disease, unspecified: J44.9

## 2019-02-27 HISTORY — DX: Malignant neoplasm of unspecified site of unspecified female breast: C50.919

## 2019-02-27 LAB — CBC
HCT: 48.1 % — ABNORMAL HIGH (ref 36.0–46.0)
Hemoglobin: 16 g/dL — ABNORMAL HIGH (ref 12.0–15.0)
MCH: 33.3 pg (ref 26.0–34.0)
MCHC: 33.3 g/dL (ref 30.0–36.0)
MCV: 100.2 fL — ABNORMAL HIGH (ref 80.0–100.0)
Platelets: 325 10*3/uL (ref 150–400)
RBC: 4.8 MIL/uL (ref 3.87–5.11)
RDW: 13.3 % (ref 11.5–15.5)
WBC: 14.7 10*3/uL — ABNORMAL HIGH (ref 4.0–10.5)
nRBC: 0 % (ref 0.0–0.2)

## 2019-02-27 LAB — HIV ANTIBODY (ROUTINE TESTING W REFLEX): HIV Screen 4th Generation wRfx: NONREACTIVE

## 2019-02-27 LAB — I-STAT CHEM 8, ED
BUN: 9 mg/dL (ref 6–20)
Calcium, Ion: 0.98 mmol/L — ABNORMAL LOW (ref 1.15–1.40)
Chloride: 106 mmol/L (ref 98–111)
Creatinine, Ser: 1 mg/dL (ref 0.44–1.00)
Glucose, Bld: 130 mg/dL — ABNORMAL HIGH (ref 70–99)
HCT: 49 % — ABNORMAL HIGH (ref 36.0–46.0)
Hemoglobin: 16.7 g/dL — ABNORMAL HIGH (ref 12.0–15.0)
Potassium: 3.5 mmol/L (ref 3.5–5.1)
Sodium: 141 mmol/L (ref 135–145)
TCO2: 19 mmol/L — ABNORMAL LOW (ref 22–32)

## 2019-02-27 LAB — LACTIC ACID, PLASMA: Lactic Acid, Venous: 7.8 mmol/L (ref 0.5–1.9)

## 2019-02-27 LAB — COMPREHENSIVE METABOLIC PANEL
ALT: 199 U/L — ABNORMAL HIGH (ref 0–44)
AST: 349 U/L — ABNORMAL HIGH (ref 15–41)
Albumin: 3.7 g/dL (ref 3.5–5.0)
Alkaline Phosphatase: 88 U/L (ref 38–126)
Anion gap: 23 — ABNORMAL HIGH (ref 5–15)
BUN: 9 mg/dL (ref 6–20)
CO2: 17 mmol/L — ABNORMAL LOW (ref 22–32)
Calcium: 8.7 mg/dL — ABNORMAL LOW (ref 8.9–10.3)
Chloride: 101 mmol/L (ref 98–111)
Creatinine, Ser: 1.17 mg/dL — ABNORMAL HIGH (ref 0.44–1.00)
GFR calc Af Amer: >60 mL/min (ref >60)
GFR calc non Af Amer: 53 mL/min — ABNORMAL LOW (ref >60)
Glucose, Bld: 138 mg/dL — ABNORMAL HIGH (ref 70–99)
Potassium: 4.5 mmol/L (ref 3.5–5.1)
Sodium: 141 mmol/L (ref 135–145)
Total Bilirubin: 1.2 mg/dL (ref 0.3–1.2)
Total Protein: 7 g/dL (ref 6.5–8.1)

## 2019-02-27 LAB — RESPIRATORY PANEL BY RT PCR (FLU A&B, COVID)
Influenza A by PCR: NEGATIVE
Influenza B by PCR: NEGATIVE
SARS Coronavirus 2 by RT PCR: NEGATIVE

## 2019-02-27 LAB — PROTIME-INR
INR: 1 (ref 0.8–1.2)
Prothrombin Time: 13 seconds (ref 11.4–15.2)

## 2019-02-27 LAB — ACETAMINOPHEN LEVEL: Acetaminophen (Tylenol), Serum: <10 ug/mL — ABNORMAL LOW (ref 10–30)

## 2019-02-27 LAB — ETHANOL: Alcohol, Ethyl (B): 150 mg/dL — ABNORMAL HIGH (ref <10)

## 2019-02-27 LAB — ABO/RH: ABO/RH(D): A POS

## 2019-02-27 LAB — CDS SEROLOGY

## 2019-02-27 LAB — SALICYLATE LEVEL: Salicylate Lvl: <7 mg/dL — ABNORMAL LOW (ref 7.0–30.0)

## 2019-02-27 MED ORDER — PROPOFOL 10 MG/ML IV BOLUS: 0.5000 mg/kg | Freq: Once | INTRAVENOUS | Status: DC

## 2019-02-27 MED ORDER — FENTANYL CITRATE (PF) 100 MCG/2ML IJ SOLN
INTRAMUSCULAR | Status: AC | PRN
  Administered 2019-02-27: 50 ug via INTRAVENOUS

## 2019-02-27 MED ORDER — CHLORHEXIDINE GLUCONATE CLOTH 2 % EX PADS
6.0000 | MEDICATED_PAD | Freq: Every day | CUTANEOUS | Status: DC
  Administered 2019-03-01 – 2019-03-08 (×7): 6 via TOPICAL

## 2019-02-27 MED ORDER — ONDANSETRON HCL 4 MG/2ML IJ SOLN
4.0000 mg | Freq: Four times a day (QID) | INTRAMUSCULAR | Status: DC | PRN
  Administered 2019-02-27: 19:00:00 4 mg via INTRAVENOUS
  Filled 2019-02-27: qty 2

## 2019-02-27 MED ORDER — PANTOPRAZOLE SODIUM 40 MG PO TBEC: 40.0000 mg | DELAYED_RELEASE_TABLET | Freq: Every day | ORAL | Status: DC

## 2019-02-27 MED ORDER — FENTANYL CITRATE (PF) 100 MCG/2ML IJ SOLN
INTRAMUSCULAR | Status: AC
  Filled 2019-02-27: qty 2

## 2019-02-27 MED ORDER — DEXTROSE-NACL 5-0.9 % IV SOLN
INTRAVENOUS | Status: DC
  Administered 2019-02-27: 23:00:00 1000 mL via INTRAVENOUS

## 2019-02-27 MED ORDER — IOHEXOL 300 MG/ML  SOLN
100.0000 mL | Freq: Once | INTRAMUSCULAR | Status: AC | PRN
  Administered 2019-02-27: 17:00:00 100 mL via INTRAVENOUS

## 2019-02-27 MED ORDER — FENTANYL CITRATE (PF) 100 MCG/2ML IJ SOLN: 100.0000 ug | Freq: Once | INTRAMUSCULAR | Status: DC

## 2019-02-27 MED ORDER — PANTOPRAZOLE SODIUM 40 MG IV SOLR: 40.0000 mg | Freq: Every day | INTRAVENOUS | Status: DC

## 2019-02-27 MED ORDER — HYDROMORPHONE HCL 1 MG/ML IJ SOLN
2.0000 mg | INTRAMUSCULAR | Status: DC | PRN
  Administered 2019-02-27: 19:00:00 2 mg via INTRAVENOUS
  Administered 2019-02-28: 08:00:00 4 mg via INTRAVENOUS
  Filled 2019-02-27: qty 4
  Filled 2019-02-27: qty 2

## 2019-02-27 MED ORDER — OXYCODONE HCL 5 MG PO TABS: 10.0000 mg | ORAL_TABLET | ORAL | Status: DC | PRN

## 2019-02-27 MED ORDER — PROPOFOL 10 MG/ML IV BOLUS
INTRAVENOUS | Status: AC
  Filled 2019-02-27: qty 20

## 2019-02-27 MED ORDER — ONDANSETRON 4 MG PO TBDP
4.0000 mg | ORAL_TABLET | Freq: Four times a day (QID) | ORAL | Status: DC | PRN
  Filled 2019-02-27: qty 1

## 2019-02-27 MED ORDER — FENTANYL CITRATE (PF) 100 MCG/2ML IJ SOLN
INTRAMUSCULAR | Status: AC | PRN
  Administered 2019-02-27: 100 ug via INTRAVENOUS

## 2019-02-27 MED ORDER — HYDRALAZINE HCL 20 MG/ML IJ SOLN: 10.0000 mg | INTRAMUSCULAR | Status: DC | PRN

## 2019-02-27 MED ORDER — PROPOFOL 10 MG/ML IV BOLUS
INTRAVENOUS | Status: AC | PRN
  Administered 2019-02-27: 45 mg via INTRAVENOUS
  Administered 2019-02-27: 40 mg via INTRAVENOUS
  Administered 2019-02-27: 20 mg via INTRAVENOUS

## 2019-02-27 MED ORDER — CEFAZOLIN SODIUM-DEXTROSE 1-4 GM/50ML-% IV SOLN: 1.0000 g | Freq: Once | INTRAVENOUS | Status: DC

## 2019-02-27 MED ORDER — HEPARIN SODIUM (PORCINE) 5000 UNIT/ML IJ SOLN
5000.0000 [IU] | Freq: Three times a day (TID) | INTRAMUSCULAR | Status: DC
  Administered 2019-02-27 – 2019-02-28 (×2): 5000 [IU] via SUBCUTANEOUS
  Filled 2019-02-27 (×2): qty 1

## 2019-02-27 MED ORDER — SODIUM CHLORIDE 0.9 % IV SOLN
INTRAVENOUS | Status: AC | PRN
  Administered 2019-02-27: 1000 mL via INTRAVENOUS

## 2019-02-27 MED ORDER — DEXMEDETOMIDINE HCL IN NACL 400 MCG/100ML IV SOLN
0.2000 ug/kg/h | INTRAVENOUS | Status: DC
  Filled 2019-02-27: qty 100
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
## 2019-02-27 NOTE — ED Notes (Signed)
Second Unit of [facility] given [id number]
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
## 2019-02-27 NOTE — ED Notes (Signed)
Upgraded to level 1

## 2019-02-27 NOTE — ED Notes (Signed)
FAST exam negative
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
## 2019-02-27 NOTE — ED Notes (Signed)
Second unit of plasma given [id number]
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
## 2019-02-27 NOTE — ED Notes (Addendum)
One unit of PRBC given [id number]
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
## 2019-02-27 NOTE — ED Notes (Signed)
Main lab to add on Acetaminophen and Salicylate levels
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
## 2019-02-27 NOTE — ED Notes (Signed)
One unit of plasma given [id number]

## 2019-02-27 NOTE — ED Notes (Signed)
Pt transported to CT ?

## 2019-02-28 ENCOUNTER — Inpatient Hospital Stay (HOSPITAL_COMMUNITY): Payer: No Typology Code available for payment source | Admitting: Certified Registered"

## 2019-02-28 ENCOUNTER — Inpatient Hospital Stay (HOSPITAL_COMMUNITY): Payer: No Typology Code available for payment source

## 2019-02-28 ENCOUNTER — Encounter (HOSPITAL_COMMUNITY): Payer: Self-pay

## 2019-02-28 ENCOUNTER — Encounter (HOSPITAL_COMMUNITY): Admission: EM | Disposition: A | Discharge status: Home Health | Payer: Self-pay | Source: Home / Self Care

## 2019-02-28 HISTORY — PX: HIP PINNING,CANNULATED: SHX1758

## 2019-02-28 HISTORY — PX: ORIF ANKLE FRACTURE: SHX5408

## 2019-02-28 HISTORY — PX: FEMUR IM NAIL: SHX1597

## 2019-02-28 LAB — URINALYSIS, ROUTINE W REFLEX MICROSCOPIC
Bacteria, UA: NONE SEEN
Bilirubin Urine: NEGATIVE
Glucose, UA: 150 mg/dL — AB
Ketones, ur: NEGATIVE mg/dL
Leukocytes,Ua: NEGATIVE
Nitrite: NEGATIVE
Protein, ur: 30 mg/dL — AB
Specific Gravity, Urine: 1.036 — ABNORMAL HIGH (ref 1.005–1.030)
pH: 5 (ref 5.0–8.0)

## 2019-02-28 LAB — BASIC METABOLIC PANEL
Anion gap: 10 (ref 5–15)
BUN: 10 mg/dL (ref 6–20)
CO2: 23 mmol/L (ref 22–32)
Calcium: 7.8 mg/dL — ABNORMAL LOW (ref 8.9–10.3)
Chloride: 111 mmol/L (ref 98–111)
Creatinine, Ser: 0.93 mg/dL (ref 0.44–1.00)
GFR calc Af Amer: >60 mL/min (ref >60)
GFR calc non Af Amer: >60 mL/min (ref >60)
Glucose, Bld: 166 mg/dL — ABNORMAL HIGH (ref 70–99)
Potassium: 3.9 mmol/L (ref 3.5–5.1)
Sodium: 144 mmol/L (ref 135–145)

## 2019-02-28 LAB — PREPARE FRESH FROZEN PLASMA
Unit division: 0
Unit division: 0

## 2019-02-28 LAB — BPAM FFP
Blood Product Expiration Date: 202101082359
Blood Product Expiration Date: 202101082359
ISSUE DATE / TIME: 202101051635
ISSUE DATE / TIME: 202101051642
Unit Type and Rh: 6200
Unit Type and Rh: 6200

## 2019-02-28 LAB — BPAM RBC
Blood Product Expiration Date: 202101142359
Blood Product Expiration Date: 202101312359
Blood Product Expiration Date: 202101312359
ISSUE DATE / TIME: 202101051628
ISSUE DATE / TIME: 202101051641
ISSUE DATE / TIME: 202101051641
Unit Type and Rh: 5100
Unit Type and Rh: 5100
Unit Type and Rh: 9500

## 2019-02-28 LAB — TYPE AND SCREEN
ABO/RH(D): A POS
Antibody Screen: NEGATIVE
Unit division: 0
Unit division: 0
Unit division: 0

## 2019-02-28 LAB — CBC
HCT: 38.7 % (ref 36.0–46.0)
Hemoglobin: 12.6 g/dL (ref 12.0–15.0)
MCH: 32 pg (ref 26.0–34.0)
MCHC: 32.6 g/dL (ref 30.0–36.0)
MCV: 98.2 fL (ref 80.0–100.0)
Platelets: 143 10*3/uL — ABNORMAL LOW (ref 150–400)
RBC: 3.94 MIL/uL (ref 3.87–5.11)
RDW: 14 % (ref 11.5–15.5)
WBC: 7.2 10*3/uL (ref 4.0–10.5)
nRBC: 0 % (ref 0.0–0.2)

## 2019-02-28 LAB — RAPID URINE DRUG SCREEN, HOSP PERFORMED
Amphetamines: NOT DETECTED
Barbiturates: NOT DETECTED
Benzodiazepines: NOT DETECTED
Cocaine: NOT DETECTED
Opiates: POSITIVE — AB
Tetrahydrocannabinol: NOT DETECTED

## 2019-02-28 LAB — BLOOD PRODUCT ORDER (VERBAL) VERIFICATION

## 2019-02-28 LAB — GLUCOSE, CAPILLARY: Glucose-Capillary: 161 mg/dL — ABNORMAL HIGH (ref 70–99)

## 2019-02-28 LAB — MRSA PCR SCREENING: MRSA by PCR: NEGATIVE

## 2019-02-28 LAB — VITAMIN D 25 HYDROXY (VIT D DEFICIENCY, FRACTURES): Vit D, 25-Hydroxy: 19.85 ng/mL — ABNORMAL LOW (ref 30–100)

## 2019-02-28 SURGERY — INSERTION, INTRAMEDULLARY ROD, FEMUR, RETROGRADE
Anesthesia: General | Site: Hip | Laterality: Right

## 2019-02-28 MED ORDER — ADULT MULTIVITAMIN W/MINERALS CH
1.0000 | ORAL_TABLET | Freq: Every day | ORAL | Status: DC
  Administered 2019-03-01 – 2019-03-26 (×25): 1 via ORAL
  Filled 2019-02-28 (×25): qty 1

## 2019-02-28 MED ORDER — THIAMINE HCL 100 MG/ML IJ SOLN: 100.0000 mg | Freq: Every day | INTRAMUSCULAR | Status: DC

## 2019-02-28 MED ORDER — ENOXAPARIN SODIUM 40 MG/0.4ML ~~LOC~~ SOLN: 40.0000 mg | SUBCUTANEOUS | Status: DC

## 2019-02-28 MED ORDER — OXYCODONE HCL 5 MG/5ML PO SOLN
5.0000 mg | ORAL | Status: DC | PRN
  Administered 2019-02-28: 19:00:00 10 mg via ORAL
  Administered 2019-03-01 (×3): 5 mg via ORAL
  Filled 2019-02-28: qty 5
  Filled 2019-02-28: qty 10
  Filled 2019-02-28 (×2): qty 5

## 2019-02-28 MED ORDER — ONDANSETRON HCL 4 MG/2ML IJ SOLN
INTRAMUSCULAR | Status: AC
  Filled 2019-02-28: qty 2

## 2019-02-28 MED ORDER — DEXAMETHASONE SODIUM PHOSPHATE 10 MG/ML IJ SOLN
INTRAMUSCULAR | Status: DC | PRN
  Administered 2019-02-28: 8 mg via INTRAVENOUS

## 2019-02-28 MED ORDER — FOLIC ACID 5 MG/ML IJ SOLN: 1.0000 mg | Freq: Every day | INTRAMUSCULAR | Status: DC

## 2019-02-28 MED ORDER — TOBRAMYCIN SULFATE 1.2 G IJ SOLR
INTRAMUSCULAR | Status: DC | PRN
  Administered 2019-02-28: 1.2 g via TOPICAL

## 2019-02-28 MED ORDER — PHENYLEPHRINE 40 MCG/ML (10ML) SYRINGE FOR IV PUSH (FOR BLOOD PRESSURE SUPPORT)
PREFILLED_SYRINGE | INTRAVENOUS | Status: AC
  Filled 2019-02-28: qty 10

## 2019-02-28 MED ORDER — THIAMINE HCL 100 MG PO TABS
100.0000 mg | ORAL_TABLET | Freq: Every day | ORAL | Status: DC
  Administered 2019-03-01 – 2019-03-26 (×25): 100 mg via ORAL
  Filled 2019-02-28 (×26): qty 1

## 2019-02-28 MED ORDER — ENOXAPARIN SODIUM 30 MG/0.3ML ~~LOC~~ SOLN: 30.0000 mg | Freq: Two times a day (BID) | SUBCUTANEOUS | Status: DC

## 2019-02-28 MED ORDER — PHENYLEPHRINE 40 MCG/ML (10ML) SYRINGE FOR IV PUSH (FOR BLOOD PRESSURE SUPPORT)
PREFILLED_SYRINGE | INTRAVENOUS | Status: DC | PRN
  Administered 2019-02-28: 120 ug via INTRAVENOUS

## 2019-02-28 MED ORDER — OXYCODONE HCL 5 MG/5ML PO SOLN: 5.0000 mg | ORAL | Status: DC | PRN

## 2019-02-28 MED ORDER — MIDAZOLAM HCL 2 MG/2ML IJ SOLN
INTRAMUSCULAR | Status: AC
  Filled 2019-02-28: qty 2

## 2019-02-28 MED ORDER — CEFAZOLIN SODIUM-DEXTROSE 2-4 GM/100ML-% IV SOLN
2.0000 g | INTRAVENOUS | Status: AC
  Administered 2019-02-28: 2 g via INTRAVENOUS
  Filled 2019-02-28 (×2): qty 100

## 2019-02-28 MED ORDER — FENTANYL CITRATE (PF) 250 MCG/5ML IJ SOLN
INTRAMUSCULAR | Status: AC
  Filled 2019-02-28: qty 5

## 2019-02-28 MED ORDER — ACETAMINOPHEN 500 MG PO TABS
1000.0000 mg | ORAL_TABLET | Freq: Four times a day (QID) | ORAL | Status: DC
  Administered 2019-02-28 – 2019-03-19 (×56): 1000 mg via ORAL
  Administered 2019-03-19: 500 mg via ORAL
  Administered 2019-03-19 – 2019-03-26 (×22): 1000 mg via ORAL
  Filled 2019-02-28 (×83): qty 2

## 2019-02-28 MED ORDER — SODIUM CHLORIDE 0.9 % IR SOLN
Status: DC | PRN
  Administered 2019-02-28: 3000 mL

## 2019-02-28 MED ORDER — PROPOFOL 10 MG/ML IV BOLUS
INTRAVENOUS | Status: DC | PRN
  Administered 2019-02-28: 130 mg via INTRAVENOUS

## 2019-02-28 MED ORDER — HYDROMORPHONE HCL 1 MG/ML IJ SOLN: 3.0000 mg | INTRAMUSCULAR | Status: DC | PRN

## 2019-02-28 MED ORDER — GABAPENTIN 100 MG PO CAPS
100.0000 mg | ORAL_CAPSULE | Freq: Three times a day (TID) | ORAL | Status: DC
  Administered 2019-02-28 – 2019-03-01 (×2): 100 mg via ORAL
  Filled 2019-02-28 (×2): qty 1

## 2019-02-28 MED ORDER — LIDOCAINE 2% (20 MG/ML) 5 ML SYRINGE
INTRAMUSCULAR | Status: DC | PRN
  Administered 2019-02-28: 60 mg via INTRAVENOUS
  Administered 2019-02-28: 40 mg via INTRAVENOUS

## 2019-02-28 MED ORDER — OXYCODONE HCL 5 MG PO TABS
10.0000 mg | ORAL_TABLET | ORAL | Status: DC | PRN
  Administered 2019-03-02 – 2019-03-05 (×3): 10 mg via ORAL
  Filled 2019-02-28 (×2): qty 2

## 2019-02-28 MED ORDER — LACTATED RINGERS IV SOLN: INTRAVENOUS | Status: DC | PRN

## 2019-02-28 MED ORDER — METHOCARBAMOL 500 MG PO TABS
500.0000 mg | ORAL_TABLET | Freq: Four times a day (QID) | ORAL | Status: DC | PRN
  Administered 2019-03-02 – 2019-03-06 (×3): 500 mg via ORAL
  Filled 2019-02-28 (×3): qty 1

## 2019-02-28 MED ORDER — FENTANYL CITRATE (PF) 100 MCG/2ML IJ SOLN
INTRAMUSCULAR | Status: DC | PRN
  Administered 2019-02-28: 100 ug via INTRAVENOUS
  Administered 2019-02-28: 50 ug via INTRAVENOUS
  Administered 2019-02-28: 100 ug via INTRAVENOUS
  Administered 2019-02-28: 150 ug via INTRAVENOUS
  Administered 2019-02-28: 100 ug via INTRAVENOUS

## 2019-02-28 MED ORDER — OXYCODONE HCL 5 MG/5ML PO SOLN: 5.0000 mg | Freq: Once | ORAL | Status: DC | PRN

## 2019-02-28 MED ORDER — TOBRAMYCIN SULFATE 1.2 G IJ SOLR
INTRAMUSCULAR | Status: AC
  Filled 2019-02-28: qty 1.2

## 2019-02-28 MED ORDER — ONDANSETRON HCL 4 MG/2ML IJ SOLN
INTRAMUSCULAR | Status: DC | PRN
  Administered 2019-02-28: 4 mg via INTRAVENOUS

## 2019-02-28 MED ORDER — HYDROMORPHONE HCL 1 MG/ML IJ SOLN: 1.0000 mg | INTRAMUSCULAR | Status: DC | PRN

## 2019-02-28 MED ORDER — VANCOMYCIN HCL 1000 MG IV SOLR
INTRAVENOUS | Status: DC | PRN
  Administered 2019-02-28 (×2): 1000 mg via TOPICAL

## 2019-02-28 MED ORDER — VANCOMYCIN HCL 1000 MG IV SOLR
INTRAVENOUS | Status: AC
  Filled 2019-02-28: qty 3000

## 2019-02-28 MED ORDER — ALBUMIN HUMAN 5 % IV SOLN: INTRAVENOUS | Status: DC | PRN

## 2019-02-28 MED ORDER — LIDOCAINE 2% (20 MG/ML) 5 ML SYRINGE
INTRAMUSCULAR | Status: AC
  Filled 2019-02-28: qty 5

## 2019-02-28 MED ORDER — FOLIC ACID 1 MG PO TABS
1.0000 mg | ORAL_TABLET | Freq: Every day | ORAL | Status: DC
  Administered 2019-03-01 – 2019-03-26 (×25): 1 mg via ORAL
  Filled 2019-02-28 (×25): qty 1

## 2019-02-28 MED ORDER — OXYCODONE HCL 5 MG PO TABS: 5.0000 mg | ORAL_TABLET | Freq: Once | ORAL | Status: DC | PRN

## 2019-02-28 MED ORDER — HYDROMORPHONE HCL 1 MG/ML IJ SOLN
1.0000 mg | INTRAMUSCULAR | Status: DC | PRN
  Administered 2019-03-01 – 2019-03-02 (×3): 1 mg via INTRAVENOUS
  Filled 2019-02-28 (×4): qty 1

## 2019-02-28 MED ORDER — PROPOFOL 10 MG/ML IV BOLUS
INTRAVENOUS | Status: AC
  Filled 2019-02-28: qty 20

## 2019-02-28 MED ORDER — HYDROMORPHONE HCL 1 MG/ML IJ SOLN: 0.2500 mg | INTRAMUSCULAR | Status: DC | PRN

## 2019-02-28 MED ORDER — ROCURONIUM BROMIDE 10 MG/ML (PF) SYRINGE
PREFILLED_SYRINGE | INTRAVENOUS | Status: AC
  Filled 2019-02-28: qty 10

## 2019-02-28 MED ORDER — ONDANSETRON HCL 4 MG/2ML IJ SOLN: 4.0000 mg | Freq: Once | INTRAMUSCULAR | Status: DC | PRN

## 2019-02-28 MED ORDER — CEFAZOLIN SODIUM-DEXTROSE 2-4 GM/100ML-% IV SOLN
2.0000 g | Freq: Three times a day (TID) | INTRAVENOUS | Status: AC
  Administered 2019-02-28 – 2019-03-01 (×3): 2 g via INTRAVENOUS
  Filled 2019-02-28 (×4): qty 100

## 2019-02-28 MED ORDER — 0.9 % SODIUM CHLORIDE (POUR BTL) OPTIME
TOPICAL | Status: DC | PRN
  Administered 2019-02-28 (×2): 1000 mL

## 2019-02-28 MED ORDER — DEXAMETHASONE SODIUM PHOSPHATE 10 MG/ML IJ SOLN
INTRAMUSCULAR | Status: AC
  Filled 2019-02-28: qty 1

## 2019-02-28 MED ORDER — MIDAZOLAM HCL 5 MG/5ML IJ SOLN
INTRAMUSCULAR | Status: DC | PRN
  Administered 2019-02-28: 2 mg via INTRAVENOUS

## 2019-02-28 MED ORDER — ROCURONIUM BROMIDE 50 MG/5ML IV SOSY
PREFILLED_SYRINGE | INTRAVENOUS | Status: DC | PRN
  Administered 2019-02-28: 30 mg via INTRAVENOUS
  Administered 2019-02-28: 10 mg via INTRAVENOUS
  Administered 2019-02-28: 60 mg via INTRAVENOUS

## 2019-02-28 MED ORDER — LACTATED RINGERS IV SOLN: INTRAVENOUS | Status: DC

## 2019-02-28 SURGICAL SUPPLY — 102 items
BIT DRILL 4.2 (DRILL) IMPLANT
BIT DRILL FLUTED FEMUR 4.2/3 (BIT) ×2 IMPLANT
BIT DRILL QC 2.5MMX180MM STRL (BIT) ×1
BIT DRILL QC 2.5X180 STRL (BIT) ×1 IMPLANT
BIT DRILL QC 3.5X110 (BIT) ×2 IMPLANT
BNDG COHESIVE 4X5 TAN STRL (GAUZE/BANDAGES/DRESSINGS) ×5 IMPLANT
BNDG COHESIVE 6X5 TAN STRL LF (GAUZE/BANDAGES/DRESSINGS) ×5 IMPLANT
BNDG ELASTIC 4X5.8 VLCR STR LF (GAUZE/BANDAGES/DRESSINGS) ×7 IMPLANT
BNDG ELASTIC 6X10 VLCR STRL LF (GAUZE/BANDAGES/DRESSINGS) ×4 IMPLANT
BNDG ELASTIC 6X5.8 VLCR STR LF (GAUZE/BANDAGES/DRESSINGS) ×7 IMPLANT
BNDG GAUZE ELAST 4 BULKY (GAUZE/BANDAGES/DRESSINGS) ×6 IMPLANT
BNDG PLASTER X FAST 5X5 WHT LF (CAST SUPPLIES) ×2 IMPLANT
BRUSH SCRUB EZ PLAIN DRY (MISCELLANEOUS) ×10 IMPLANT
CHLORAPREP W/TINT 26 (MISCELLANEOUS) ×9 IMPLANT
CLOSURE WOUND 1/2 X4 (GAUZE/BANDAGES/DRESSINGS)
COVER MAYO STAND STRL (DRAPES) ×5 IMPLANT
COVER SURGICAL LIGHT HANDLE (MISCELLANEOUS) ×12 IMPLANT
COVER WAND RF STERILE (DRAPES) ×3 IMPLANT
DERMABOND ADVANCED (GAUZE/BANDAGES/DRESSINGS) ×4
DERMABOND ADVANCED .7 DNX12 (GAUZE/BANDAGES/DRESSINGS) ×3 IMPLANT
DRAPE C-ARM 42X72 X-RAY (DRAPES) ×7 IMPLANT
DRAPE C-ARMOR (DRAPES) ×7 IMPLANT
DRAPE HALF SHEET 40X57 (DRAPES) ×10 IMPLANT
DRAPE IMP U-DRAPE 54X76 (DRAPES) ×12 IMPLANT
DRAPE INCISE IOBAN 66X45 STRL (DRAPES) ×5 IMPLANT
DRAPE ORTHO SPLIT 77X108 STRL (DRAPES) ×4
DRAPE STERI IOBAN 125X83 (DRAPES) ×5 IMPLANT
DRAPE SURG 17X23 STRL (DRAPES) ×5 IMPLANT
DRAPE SURG ORHT 6 SPLT 77X108 (DRAPES) ×6 IMPLANT
DRAPE U-SHAPE 47X51 STRL (DRAPES) ×5 IMPLANT
DRILL 4.2 (DRILL) ×10
DRSG MEPILEX BORDER 4X12 (GAUZE/BANDAGES/DRESSINGS) ×2 IMPLANT
DRSG MEPILEX BORDER 4X4 (GAUZE/BANDAGES/DRESSINGS) ×11 IMPLANT
DRSG MEPILEX BORDER 4X8 (GAUZE/BANDAGES/DRESSINGS) ×2 IMPLANT
DRSG MEPITEL 4X7.2 (GAUZE/BANDAGES/DRESSINGS) IMPLANT
ELECT REM PT RETURN 9FT ADLT (ELECTROSURGICAL) ×5
ELECTRODE REM PT RTRN 9FT ADLT (ELECTROSURGICAL) ×3 IMPLANT
GAUZE SPONGE 4X4 12PLY STRL (GAUZE/BANDAGES/DRESSINGS) ×5 IMPLANT
GLOVE BIO SURGEON STRL SZ 6.5 (GLOVE) ×12 IMPLANT
GLOVE BIO SURGEON STRL SZ7.5 (GLOVE) ×20 IMPLANT
GLOVE BIO SURGEONS STRL SZ 6.5 (GLOVE) ×3
GLOVE BIOGEL PI IND STRL 6.5 (GLOVE) ×3 IMPLANT
GLOVE BIOGEL PI IND STRL 7.5 (GLOVE) ×3 IMPLANT
GLOVE BIOGEL PI INDICATOR 6.5 (GLOVE) ×4
GLOVE BIOGEL PI INDICATOR 7.5 (GLOVE) ×4
GLOVE ECLIPSE 8.0 STRL XLNG CF (GLOVE) ×2 IMPLANT
GLOVE SURG SS PI 6.5 STRL IVOR (GLOVE) ×8 IMPLANT
GOWN STRL REUS W/ TWL LRG LVL3 (GOWN DISPOSABLE) ×6 IMPLANT
GOWN STRL REUS W/TWL LRG LVL3 (GOWN DISPOSABLE) ×4
GUIDEPIN DHS/DCS (PIN) ×4 IMPLANT
GUIDEWIRE 3.2X290 (WIRE) ×2 IMPLANT
KIT BASIN OR (CUSTOM PROCEDURE TRAY) ×5 IMPLANT
KIT TURNOVER KIT B (KITS) ×5 IMPLANT
MANIFOLD NEPTUNE II (INSTRUMENTS) ×7 IMPLANT
NAIL RETRO FEM 10MMX340MM (Nail) ×2 IMPLANT
NEEDLE 22X1 1/2 (OR ONLY) (NEEDLE) IMPLANT
NS IRRIG 1000ML POUR BTL (IV SOLUTION) ×5 IMPLANT
PACK GENERAL/GYN (CUSTOM PROCEDURE TRAY) ×5 IMPLANT
PACK ORTHO EXTREMITY (CUSTOM PROCEDURE TRAY) ×5 IMPLANT
PAD ABD 8X10 STRL (GAUZE/BANDAGES/DRESSINGS) ×2 IMPLANT
PAD ARMBOARD 7.5X6 YLW CONV (MISCELLANEOUS) ×10 IMPLANT
PAD CAST 4YDX4 CTTN HI CHSV (CAST SUPPLIES) IMPLANT
PADDING CAST COTTON 4X4 STRL (CAST SUPPLIES) ×2
PADDING CAST COTTON 6X4 STRL (CAST SUPPLIES) ×10 IMPLANT
PENCIL BUTTON HOLSTER BLD 10FT (ELECTRODE) ×2 IMPLANT
PLATE DHS 135D 2H (Plate) ×2 IMPLANT
PLATE LCP 3.5 1/3 TUB 6HX69 (Plate) ×2 IMPLANT
REAMER ROD DEEP FLUTE 2.5X950 (INSTRUMENTS) ×2 IMPLANT
SCREW COMPRESS 36MM DHS/DCS (Orthopedic Implant) ×2 IMPLANT
SCREW CORT HEADED ST 3.5X26 (Screw) ×2 IMPLANT
SCREW CORTEX ST 4.5X38 (Screw) ×2 IMPLANT
SCREW CORTEX ST 4.5X40 (Screw) ×2 IMPLANT
SCREW DHS LAG 85MM (Screw) ×2 IMPLANT
SCREW HEADED ST 3.5X36 (Screw) ×4 IMPLANT
SCREW HEADED ST 3.5X38 (Screw) ×2 IMPLANT
SCREW LOCK STAR 5X34 (Screw) ×2 IMPLANT
SCREW LOCK STAR 5X42 (Screw) ×2 IMPLANT
SCREW LOCK STAR 5X56 (Screw) ×2 IMPLANT
SCREW LOCK STAR 5X70 (Screw) ×2 IMPLANT
SCREW LOCK TI 4X42 (Screw) ×2 IMPLANT
SCREW PELVIC CORT ST 3.5X110 (Screw) IMPLANT
SCREW SELF TAP 3.5 110 (Screw) ×2 IMPLANT
SET INTERPULSE LAVAGE W/TIP (ORTHOPEDIC DISPOSABLE SUPPLIES) ×2 IMPLANT
SPONGE LAP 18X18 RF (DISPOSABLE) ×9 IMPLANT
STAPLER VISISTAT 35W (STAPLE) ×5 IMPLANT
STOCKINETTE IMPERVIOUS LG (DRAPES) ×5 IMPLANT
STRIP CLOSURE SKIN 1/2X4 (GAUZE/BANDAGES/DRESSINGS) IMPLANT
SUT MNCRL AB 3-0 PS2 18 (SUTURE) ×7 IMPLANT
SUT MNCRL AB 3-0 PS2 27 (SUTURE) ×2 IMPLANT
SUT PROLENE 0 CT (SUTURE) IMPLANT
SUT VIC AB 0 CT1 27 (SUTURE) ×6
SUT VIC AB 0 CT1 27XBRD ANBCTR (SUTURE) IMPLANT
SUT VIC AB 2-0 CT1 (SUTURE) ×4 IMPLANT
SUT VIC AB 2-0 CT1 27 (SUTURE) ×4
SUT VIC AB 2-0 CT1 TAPERPNT 27 (SUTURE) ×3 IMPLANT
TOWEL GREEN STERILE (TOWEL DISPOSABLE) ×10 IMPLANT
TOWEL GREEN STERILE FF (TOWEL DISPOSABLE) ×10 IMPLANT
TUBE CONNECTING 12'X1/4 (SUCTIONS) ×2
TUBE CONNECTING 12X1/4 (SUCTIONS) ×5 IMPLANT
UNDERPAD 30X30 (UNDERPADS AND DIAPERS) ×7 IMPLANT
WATER STERILE IRR 1000ML POUR (IV SOLUTION) ×5 IMPLANT
YANKAUER SUCT BULB TIP NO VENT (SUCTIONS) ×7 IMPLANT

## 2019-02-28 NOTE — Anesthesia Procedure Notes (Signed)
Procedure Name: Intubation Date/Time: 02/28/2019 10:35 AM Performed by: Orlie Dakin, CRNA Pre-anesthesia Checklist: Patient identified, Emergency Drugs available, Patient being monitored and Suction available Patient Re-evaluated:Patient Re-evaluated prior to induction Oxygen Delivery Method: Circle system utilized Preoxygenation: Pre-oxygenation with 100% oxygen Induction Type: IV induction Ventilation: Mask ventilation without difficulty and Oral airway inserted - appropriate to patient size Laryngoscope Size: Sabra Heck and 3 Grade View: Grade I Tube type: Oral Tube size: 7.0 mm Number of attempts: 1 Airway Equipment and Method: Stylet Placement Confirmation: ETT inserted through vocal cords under direct vision,  positive ETCO2 and breath sounds checked- equal and bilateral Secured at: 23 cm Tube secured with: Tape Dental Injury: Teeth and Oropharynx as per pre-operative assessment  Comments: No hard c-collar on patient. In Short-Stay Dr Doreatha Martin voiced "neck cleared" by Trauma/ General Surgery, Dr Bobbye Morton.  Unable to obtain urine sample for preg check.  Dr Christella Hartigan discussed with patient in [place], noted small amount dark red blood post pharynx.  Suctioned, then intubation as noted above.  4x4s bite block used.
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
## 2019-02-28 NOTE — Progress Notes (Signed)
O2 sats continue to drop into the low 80's with weaning to nasal canula. Unable to hear breath sounds on right side. Poor cough effort. Dr Kalman Shan notified of status. En route to bedside; Stat portable chest xray ordered.
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
## 2019-02-28 NOTE — Consult Note (Signed)
Attempted psychiatry consult. Patient currently in surgery. Will assess on tomorrow, 03/01/2019. Dr Grandville Silos aware.

## 2019-02-28 NOTE — Transfer of Care (Signed)
Immediate Anesthesia Transfer of Care Note  Patient: Donna Black  Procedure(s) Performed: INTRAMEDULLARY (IM) RETROGRADE FEMORAL NAILING (Left ) CANNULATED HIP PINNING (Left Hip) Open Reduction Internal Fixation (Orif) Ankle Fracture (Right Ankle)  Patient Location: PACU  Anesthesia Type:General  Level of Consciousness: drowsy and patient cooperative  Airway & Oxygen Therapy: Patient Spontanous Breathing and Patient connected to face mask oxygen  Post-op Assessment: Report given to RN and Post -op Vital signs reviewed and stable  Post vital signs: Reviewed and stable  Last Vitals:  Vitals Value Taken Time  BP 141/66 02/28/19 1440  Temp    Pulse 120 02/28/19 1444  Resp 17 02/28/19 1444  SpO2 94 % 02/28/19 1444  Vitals shown include unvalidated device data.  Last Pain:  Vitals:   02/28/19 0809  TempSrc:   PainSc: Asleep         Complications: No apparent anesthesia complications

## 2019-03-01 ENCOUNTER — Encounter: Payer: Self-pay | Admitting: *Deleted

## 2019-03-01 DIAGNOSIS — F329 Major depressive disorder, single episode, unspecified: Secondary | ICD-10-CM

## 2019-03-01 LAB — BASIC METABOLIC PANEL
Anion gap: 12 (ref 5–15)
BUN: 6 mg/dL (ref 6–20)
CO2: 24 mmol/L (ref 22–32)
Calcium: 7.8 mg/dL — ABNORMAL LOW (ref 8.9–10.3)
Chloride: 104 mmol/L (ref 98–111)
Creatinine, Ser: 0.58 mg/dL (ref 0.44–1.00)
GFR calc Af Amer: >60 mL/min (ref >60)
GFR calc non Af Amer: >60 mL/min (ref >60)
Glucose, Bld: 135 mg/dL — ABNORMAL HIGH (ref 70–99)
Potassium: 3.6 mmol/L (ref 3.5–5.1)
Sodium: 140 mmol/L (ref 135–145)

## 2019-03-01 LAB — CBC
HCT: 32.4 % — ABNORMAL LOW (ref 36.0–46.0)
Hemoglobin: 10.6 g/dL — ABNORMAL LOW (ref 12.0–15.0)
MCH: 32.5 pg (ref 26.0–34.0)
MCHC: 32.7 g/dL (ref 30.0–36.0)
MCV: 99.4 fL (ref 80.0–100.0)
Platelets: 111 10*3/uL — ABNORMAL LOW (ref 150–400)
RBC: 3.26 MIL/uL — ABNORMAL LOW (ref 3.87–5.11)
RDW: 14 % (ref 11.5–15.5)
WBC: 6 10*3/uL (ref 4.0–10.5)
nRBC: 0 % (ref 0.0–0.2)

## 2019-03-01 LAB — HEPATIC FUNCTION PANEL
ALT: 148 U/L — ABNORMAL HIGH (ref 0–44)
AST: 176 U/L — ABNORMAL HIGH (ref 15–41)
Albumin: 3 g/dL — ABNORMAL LOW (ref 3.5–5.0)
Alkaline Phosphatase: 61 U/L (ref 38–126)
Bilirubin, Direct: 0.1 mg/dL (ref 0.0–0.2)
Indirect Bilirubin: 0.5 mg/dL (ref 0.3–0.9)
Total Bilirubin: 0.6 mg/dL (ref 0.3–1.2)
Total Protein: 5.9 g/dL — ABNORMAL LOW (ref 6.5–8.1)

## 2019-03-01 LAB — PHOSPHORUS: Phosphorus: 2 mg/dL — ABNORMAL LOW (ref 2.5–4.6)

## 2019-03-01 LAB — MAGNESIUM: Magnesium: 2 mg/dL (ref 1.7–2.4)

## 2019-03-01 MED ORDER — VENLAFAXINE HCL ER 75 MG PO CP24
75.0000 mg | ORAL_CAPSULE | Freq: Every day | ORAL | Status: DC
  Administered 2019-03-01 – 2019-03-26 (×25): 75 mg via ORAL
  Filled 2019-03-01 (×25): qty 1

## 2019-03-01 MED ORDER — ARIPIPRAZOLE 10 MG PO TABS
20.0000 mg | ORAL_TABLET | Freq: Every day | ORAL | Status: DC
  Administered 2019-03-01: 10:00:00 20 mg via ORAL
  Filled 2019-03-01 (×2): qty 2

## 2019-03-01 MED ORDER — CEFAZOLIN SODIUM-DEXTROSE 2-4 GM/100ML-% IV SOLN
2.0000 g | INTRAVENOUS | Status: AC
  Administered 2019-03-02: 10:00:00 2 g via INTRAVENOUS
  Filled 2019-03-01: qty 100

## 2019-03-01 MED ORDER — LORAZEPAM 1 MG PO TABS: 1.0000 mg | ORAL_TABLET | ORAL | Status: AC | PRN

## 2019-03-01 MED ORDER — ADULT MULTIVITAMIN W/MINERALS CH: 1.0000 | ORAL_TABLET | Freq: Every day | ORAL | Status: DC

## 2019-03-01 MED ORDER — VITAMIN D 25 MCG (1000 UNIT) PO TABS
2000.0000 [IU] | ORAL_TABLET | Freq: Two times a day (BID) | ORAL | Status: DC
  Administered 2019-03-01 – 2019-03-26 (×50): 2000 [IU] via ORAL
  Filled 2019-03-01 (×52): qty 2

## 2019-03-01 MED ORDER — LORAZEPAM 1 MG PO TABS: 0.0000 mg | ORAL_TABLET | Freq: Four times a day (QID) | ORAL | Status: AC

## 2019-03-01 MED ORDER — LORAZEPAM 1 MG PO TABS: 0.0000 mg | ORAL_TABLET | Freq: Two times a day (BID) | ORAL | Status: AC

## 2019-03-01 MED ORDER — LORAZEPAM 2 MG/ML IJ SOLN
1.0000 mg | INTRAMUSCULAR | Status: AC | PRN
  Administered 2019-03-04: 2 mg via INTRAVENOUS
  Filled 2019-03-01: qty 1

## 2019-03-01 MED ORDER — GABAPENTIN 100 MG PO CAPS
100.0000 mg | ORAL_CAPSULE | Freq: Two times a day (BID) | ORAL | Status: DC
  Administered 2019-03-01 – 2019-03-08 (×15): 100 mg via ORAL
  Filled 2019-03-01 (×16): qty 1

## 2019-03-01 MED ORDER — OXYCODONE HCL 5 MG PO TABS
5.0000 mg | ORAL_TABLET | ORAL | Status: DC | PRN
  Administered 2019-03-01: 20:00:00 5 mg via ORAL
  Administered 2019-03-02 – 2019-03-03 (×3): 10 mg via ORAL
  Filled 2019-03-01 (×2): qty 2
  Filled 2019-03-01: qty 1
  Filled 2019-03-01 (×2): qty 2

## 2019-03-01 MED ORDER — LAMOTRIGINE 100 MG PO TABS
200.0000 mg | ORAL_TABLET | Freq: Every day | ORAL | Status: DC
  Administered 2019-03-01 – 2019-03-26 (×25): 200 mg via ORAL
  Filled 2019-03-01 (×28): qty 2
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
## 2019-03-01 NOTE — Plan of Care (Signed)
  Problem: Pain Managment: Goal: General experience of comfort will improve Outcome: Progressing Note: Pt with c/c of pain to R rib cage and left hip with movement as expected post surgery. Pain is being addressed by PRN and scheduled medications, comfort measures, and distraction. Pt given pain medication interventions as needed and as ordered and re-assessed as needed. Will continue to monitor for effectiveness.

## 2019-03-01 NOTE — Anesthesia Postprocedure Evaluation (Signed)
Anesthesia Post Note  Patient: Donna Black  Procedure(s) Performed: INTRAMEDULLARY (IM) RETROGRADE FEMORAL NAILING (Left ) CANNULATED HIP PINNING (Left Hip) Open Reduction Internal Fixation (Orif) Ankle Fracture (Right Ankle)     Patient location during evaluation: PACU Anesthesia Type: General Level of consciousness: awake and alert Pain management: pain level controlled Vital Signs Assessment: post-procedure vital signs reviewed and stable Respiratory status: spontaneous breathing, nonlabored ventilation and respiratory function stable Cardiovascular status: blood pressure returned to baseline and stable Postop Assessment: no apparent nausea or vomiting Anesthetic complications: no    Last Vitals:  Vitals:   03/01/19 0800 03/01/19 1140  BP: (!) 107/57 (!) 101/59  Pulse: 99 (!) 102  Resp: 15 20  Temp: 36.8 C 36.9 C  SpO2: 90% 98%    Last Pain:  Vitals:   03/01/19 1140  TempSrc: Oral  PainSc:                  Lidia Collum

## 2019-03-02 ENCOUNTER — Inpatient Hospital Stay (HOSPITAL_COMMUNITY): Payer: No Typology Code available for payment source

## 2019-03-02 ENCOUNTER — Encounter (HOSPITAL_COMMUNITY): Payer: Self-pay

## 2019-03-02 ENCOUNTER — Inpatient Hospital Stay (HOSPITAL_COMMUNITY): Payer: No Typology Code available for payment source | Admitting: Anesthesiology

## 2019-03-02 ENCOUNTER — Encounter (HOSPITAL_COMMUNITY): Admission: EM | Disposition: A | Discharge status: Home Health | Payer: Self-pay | Source: Home / Self Care

## 2019-03-02 HISTORY — PX: OPEN REDUCTION INTERNAL FIXATION (ORIF) FOOT LISFRANC FRACTURE: SHX5990

## 2019-03-02 LAB — CBC
HCT: 26.9 % — ABNORMAL LOW (ref 36.0–46.0)
Hemoglobin: 8.9 g/dL — ABNORMAL LOW (ref 12.0–15.0)
MCH: 32.5 pg (ref 26.0–34.0)
MCHC: 33.1 g/dL (ref 30.0–36.0)
MCV: 98.2 fL (ref 80.0–100.0)
Platelets: 113 10*3/uL — ABNORMAL LOW (ref 150–400)
RBC: 2.74 MIL/uL — ABNORMAL LOW (ref 3.87–5.11)
RDW: 13.7 % (ref 11.5–15.5)
WBC: 5.6 10*3/uL (ref 4.0–10.5)
nRBC: 0 % (ref 0.0–0.2)

## 2019-03-02 LAB — BASIC METABOLIC PANEL
Anion gap: 9 (ref 5–15)
BUN: 8 mg/dL (ref 6–20)
CO2: 25 mmol/L (ref 22–32)
Calcium: 8 mg/dL — ABNORMAL LOW (ref 8.9–10.3)
Chloride: 102 mmol/L (ref 98–111)
Creatinine, Ser: 0.65 mg/dL (ref 0.44–1.00)
GFR calc Af Amer: >60 mL/min (ref >60)
GFR calc non Af Amer: >60 mL/min (ref >60)
Glucose, Bld: 101 mg/dL — ABNORMAL HIGH (ref 70–99)
Potassium: 3.2 mmol/L — ABNORMAL LOW (ref 3.5–5.1)
Sodium: 136 mmol/L (ref 135–145)

## 2019-03-02 LAB — MAGNESIUM: Magnesium: 2.1 mg/dL (ref 1.7–2.4)

## 2019-03-02 LAB — PHOSPHORUS: Phosphorus: 1.8 mg/dL — ABNORMAL LOW (ref 2.5–4.6)

## 2019-03-02 SURGERY — OPEN REDUCTION INTERNAL FIXATION (ORIF) FOOT LISFRANC FRACTURE
Anesthesia: General | Site: Foot | Laterality: Right

## 2019-03-02 MED ORDER — MIDAZOLAM HCL 2 MG/2ML IJ SOLN
INTRAMUSCULAR | Status: AC
  Filled 2019-03-02: qty 2

## 2019-03-02 MED ORDER — FENTANYL CITRATE (PF) 250 MCG/5ML IJ SOLN
INTRAMUSCULAR | Status: AC
  Filled 2019-03-02: qty 5

## 2019-03-02 MED ORDER — SODIUM PHOSPHATES 45 MMOLE/15ML IV SOLN
30.0000 mmol | Freq: Once | INTRAVENOUS | Status: AC
  Administered 2019-03-03: 30 mmol via INTRAVENOUS
  Filled 2019-03-02 (×2): qty 10

## 2019-03-02 MED ORDER — PROPOFOL 10 MG/ML IV BOLUS
INTRAVENOUS | Status: AC
  Filled 2019-03-02: qty 20

## 2019-03-02 MED ORDER — LACTATED RINGERS IV SOLN: INTRAVENOUS | Status: DC | PRN

## 2019-03-02 MED ORDER — DEXMEDETOMIDINE HCL 200 MCG/2ML IV SOLN
INTRAVENOUS | Status: DC | PRN
  Administered 2019-03-02: 12 ug via INTRAVENOUS

## 2019-03-02 MED ORDER — CEFAZOLIN SODIUM-DEXTROSE 2-4 GM/100ML-% IV SOLN
2.0000 g | Freq: Three times a day (TID) | INTRAVENOUS | Status: AC
  Administered 2019-03-02 – 2019-03-03 (×3): 2 g via INTRAVENOUS
  Filled 2019-03-02 (×3): qty 100

## 2019-03-02 MED ORDER — VANCOMYCIN HCL 1000 MG IV SOLR
INTRAVENOUS | Status: AC
  Filled 2019-03-02: qty 1000

## 2019-03-02 MED ORDER — VANCOMYCIN HCL 1000 MG IV SOLR
INTRAVENOUS | Status: DC | PRN
  Administered 2019-03-02: 1000 mg via TOPICAL

## 2019-03-02 MED ORDER — DEXAMETHASONE SODIUM PHOSPHATE 10 MG/ML IJ SOLN
INTRAMUSCULAR | Status: AC
  Filled 2019-03-02: qty 1

## 2019-03-02 MED ORDER — ONDANSETRON HCL 4 MG/2ML IJ SOLN
INTRAMUSCULAR | Status: DC | PRN
  Administered 2019-03-02: 4 mg via INTRAVENOUS

## 2019-03-02 MED ORDER — LACTATED RINGERS IV SOLN: INTRAVENOUS | Status: DC

## 2019-03-02 MED ORDER — MIDAZOLAM HCL 5 MG/5ML IJ SOLN
INTRAMUSCULAR | Status: DC | PRN
  Administered 2019-03-02: 1 mg via INTRAVENOUS

## 2019-03-02 MED ORDER — FENTANYL CITRATE (PF) 250 MCG/5ML IJ SOLN
INTRAMUSCULAR | Status: DC | PRN
  Administered 2019-03-02: 50 ug via INTRAVENOUS
  Administered 2019-03-02 (×2): 25 ug via INTRAVENOUS

## 2019-03-02 MED ORDER — 0.9 % SODIUM CHLORIDE (POUR BTL) OPTIME
TOPICAL | Status: DC | PRN
  Administered 2019-03-02: 1000 mL

## 2019-03-02 MED ORDER — PROPOFOL 10 MG/ML IV BOLUS
INTRAVENOUS | Status: DC | PRN
  Administered 2019-03-02: 120 mg via INTRAVENOUS

## 2019-03-02 MED ORDER — ACETAMINOPHEN 10 MG/ML IV SOLN
1000.0000 mg | Freq: Once | INTRAVENOUS | Status: AC
  Administered 2019-03-02: 1000 mg via INTRAVENOUS

## 2019-03-02 MED ORDER — FENTANYL CITRATE (PF) 100 MCG/2ML IJ SOLN
INTRAMUSCULAR | Status: AC
  Filled 2019-03-02: qty 2

## 2019-03-02 MED ORDER — LIDOCAINE 2% (20 MG/ML) 5 ML SYRINGE
INTRAMUSCULAR | Status: DC | PRN
  Administered 2019-03-02: 80 mg via INTRAVENOUS

## 2019-03-02 MED ORDER — POTASSIUM CHLORIDE 10 MEQ/100ML IV SOLN: 10.0000 meq | INTRAVENOUS | Status: AC

## 2019-03-02 MED ORDER — ONDANSETRON HCL 4 MG/2ML IJ SOLN
INTRAMUSCULAR | Status: AC
  Filled 2019-03-02: qty 2

## 2019-03-02 MED ORDER — DEXAMETHASONE SODIUM PHOSPHATE 10 MG/ML IJ SOLN
INTRAMUSCULAR | Status: DC | PRN
  Administered 2019-03-02: 5 mg via INTRAVENOUS

## 2019-03-02 MED ORDER — ACETAMINOPHEN 10 MG/ML IV SOLN
INTRAVENOUS | Status: AC
  Filled 2019-03-02: qty 100

## 2019-03-02 MED ORDER — POTASSIUM CHLORIDE 10 MEQ/100ML IV SOLN
10.0000 meq | INTRAVENOUS | Status: AC
  Administered 2019-03-02 (×6): 10 meq via INTRAVENOUS
  Filled 2019-03-02 (×6): qty 100

## 2019-03-02 SURGICAL SUPPLY — 85 items
BANDAGE ESMARK 6X9 LF (GAUZE/BANDAGES/DRESSINGS) ×1 IMPLANT
BIT DRILL 2.4 AO COUPLING CANN (BIT) ×2 IMPLANT
BIT DRILL 2.5X2.75 QC CALB (BIT) ×2 IMPLANT
BIT DRILL CALIBRATED 2.7 (BIT) ×1 IMPLANT
BIT DRILL CALIBRATED 2.7MM (BIT) ×1
BNDG COHESIVE 4X5 TAN STRL (GAUZE/BANDAGES/DRESSINGS) ×3 IMPLANT
BNDG ELASTIC 4X5.8 VLCR STR LF (GAUZE/BANDAGES/DRESSINGS) ×2 IMPLANT
BNDG ELASTIC 6X10 VLCR STRL LF (GAUZE/BANDAGES/DRESSINGS) ×2 IMPLANT
BNDG ELASTIC 6X5.8 VLCR STR LF (GAUZE/BANDAGES/DRESSINGS) IMPLANT
BNDG ESMARK 6X9 LF (GAUZE/BANDAGES/DRESSINGS) ×3
BNDG GAUZE ELAST 4 BULKY (GAUZE/BANDAGES/DRESSINGS) ×4 IMPLANT
BRUSH SCRUB EZ PLAIN DRY (MISCELLANEOUS) ×6 IMPLANT
CAP PIN PROTECTOR ORTHO WHT (CAP) ×2 IMPLANT
CHLORAPREP W/TINT 26 (MISCELLANEOUS) ×3 IMPLANT
COVER SURGICAL LIGHT HANDLE (MISCELLANEOUS) ×3 IMPLANT
COVER WAND RF STERILE (DRAPES) ×3 IMPLANT
CUFF TOURN SGL QUICK 34 (TOURNIQUET CUFF) ×2
CUFF TRNQT CYL 34X4.125X (TOURNIQUET CUFF) IMPLANT
DRAPE C-ARM 42X72 X-RAY (DRAPES) ×3 IMPLANT
DRAPE C-ARMOR (DRAPES) ×3 IMPLANT
DRAPE ORTHO SPLIT 77X108 STRL (DRAPES) ×4
DRAPE SURG ORHT 6 SPLT 77X108 (DRAPES) ×2 IMPLANT
DRAPE U-SHAPE 47X51 STRL (DRAPES) ×3 IMPLANT
DRSG ADAPTIC 3X8 NADH LF (GAUZE/BANDAGES/DRESSINGS) IMPLANT
DRSG MEPITEL 4X7.2 (GAUZE/BANDAGES/DRESSINGS) ×2 IMPLANT
ELECT REM PT RETURN 9FT ADLT (ELECTROSURGICAL) ×3
ELECTRODE REM PT RTRN 9FT ADLT (ELECTROSURGICAL) ×1 IMPLANT
GAUZE SPONGE 4X4 12PLY STRL (GAUZE/BANDAGES/DRESSINGS) ×2 IMPLANT
GLOVE BIO SURGEON STRL SZ 6.5 (GLOVE) ×6 IMPLANT
GLOVE BIO SURGEON STRL SZ7.5 (GLOVE) ×9 IMPLANT
GLOVE BIO SURGEONS STRL SZ 6.5 (GLOVE) ×3
GLOVE BIOGEL PI IND STRL 6.5 (GLOVE) ×1 IMPLANT
GLOVE BIOGEL PI IND STRL 7.5 (GLOVE) ×1 IMPLANT
GLOVE BIOGEL PI INDICATOR 6.5 (GLOVE) ×2
GLOVE BIOGEL PI INDICATOR 7.5 (GLOVE) ×2
GOWN STRL REUS W/ TWL LRG LVL3 (GOWN DISPOSABLE) ×2 IMPLANT
GOWN STRL REUS W/TWL LRG LVL3 (GOWN DISPOSABLE) ×4
K-WIRE ACE 1.6X6 (WIRE) ×3
K-WIRE FIXATION 2.0X6 (WIRE) ×9
KIT TURNOVER KIT B (KITS) ×3 IMPLANT
KWIRE ACE 1.6X6 (WIRE) IMPLANT
KWIRE FIXATION 2.0X6 (WIRE) IMPLANT
MANIFOLD NEPTUNE II (INSTRUMENTS) ×3 IMPLANT
NDL HYPO 21X1.5 SAFETY (NEEDLE) IMPLANT
NDL HYPO 25GX1X1/2 BEV (NEEDLE) ×1 IMPLANT
NEEDLE HYPO 21X1.5 SAFETY (NEEDLE) IMPLANT
NEEDLE HYPO 25GX1X1/2 BEV (NEEDLE) ×3 IMPLANT
NS IRRIG 1000ML POUR BTL (IV SOLUTION) ×3 IMPLANT
PACK TOTAL JOINT (CUSTOM PROCEDURE TRAY) ×3 IMPLANT
PAD ABD 8X10 STRL (GAUZE/BANDAGES/DRESSINGS) ×4 IMPLANT
PAD ARMBOARD 7.5X6 YLW CONV (MISCELLANEOUS) ×6 IMPLANT
PAD CAST 4YDX4 CTTN HI CHSV (CAST SUPPLIES) IMPLANT
PADDING CAST COTTON 4X4 STRL (CAST SUPPLIES)
PADDING CAST COTTON 6X4 STRL (CAST SUPPLIES) IMPLANT
PLATE LOCK SM LAT MIDFOOT (Plate) ×2 IMPLANT
SCREW CANN PT 4X44 NS (Screw) IMPLANT
SCREW CANNULATED PT 4.0X44 (Screw) ×3 IMPLANT
SCREW CORT 3.5X26 (Screw) ×2 IMPLANT
SCREW CORT 3.5X32 (Screw) ×2 IMPLANT
SCREW CORT T15 26X3.5XST LCK (Screw) IMPLANT
SCREW CORT T15 28X3.5XST LCK (Screw) IMPLANT
SCREW CORT T15 32X3.5XST LCK (Screw) IMPLANT
SCREW CORTICAL 3.5X28MM (Screw) ×2 IMPLANT
SCREW CORTICAL LOW PROF 3.5X20 (Screw) ×2 IMPLANT
SCREW LOCK CORT STAR 3.5X26 (Screw) ×2 IMPLANT
SCREW LOCK CORT STAR 3.5X28 (Screw) ×2 IMPLANT
SCREW LOCK CORT STAR 3.5X32 (Screw) ×2 IMPLANT
SCREW LOW PROFILE 22MMX3.5MM (Screw) ×2 IMPLANT
SPONGE LAP 18X18 RF (DISPOSABLE) IMPLANT
STAPLER VISISTAT 35W (STAPLE) ×3 IMPLANT
SUCTION FRAZIER HANDLE 10FR (MISCELLANEOUS) ×2
SUCTION TUBE FRAZIER 10FR DISP (MISCELLANEOUS) ×1 IMPLANT
SUT ETHILON 3 0 PS 1 (SUTURE) ×6 IMPLANT
SUT MON AB 2-0 CT1 36 (SUTURE) ×2 IMPLANT
SUT PDS AB 0 CT1 27 (SUTURE) ×2 IMPLANT
SUT PROLENE 0 CT (SUTURE) IMPLANT
SUT VIC AB 0 CT1 27 (SUTURE) ×2
SUT VIC AB 0 CT1 27XBRD ANBCTR (SUTURE) ×1 IMPLANT
SUT VIC AB 2-0 CT1 27 (SUTURE) ×4
SUT VIC AB 2-0 CT1 TAPERPNT 27 (SUTURE) ×2 IMPLANT
SYR CONTROL 10ML LL (SYRINGE) ×3 IMPLANT
TOWEL GREEN STERILE (TOWEL DISPOSABLE) ×6 IMPLANT
TOWEL GREEN STERILE FF (TOWEL DISPOSABLE) ×3 IMPLANT
UNDERPAD 30X30 (UNDERPADS AND DIAPERS) ×3 IMPLANT
WATER STERILE IRR 1000ML POUR (IV SOLUTION) ×3 IMPLANT

## 2019-03-02 NOTE — Transfer of Care (Signed)
Immediate Anesthesia Transfer of Care Note  Patient: Donna Black  Procedure(s) Performed: OPEN REDUCTION INTERNAL FIXATION (ORIF) FOOT LISFRANC FRACTURE (Right Foot)  Patient Location: PACU  Anesthesia Type:General  Level of Consciousness: awake, patient cooperative and responds to stimulation  Airway & Oxygen Therapy: Patient Spontanous Breathing and Patient connected to face mask oxygen  Post-op Assessment: Report given to RN and Post -op Vital signs reviewed and stable  Post vital signs: Reviewed and stable  Last Vitals:  Vitals Value Taken Time  BP 121/80 03/02/19 1258  Temp    Pulse 106 03/02/19 1259  Resp 17 03/02/19 1259  SpO2 97 % 03/02/19 1259  Vitals shown include unvalidated device data.  Last Pain:  Vitals:   03/02/19 0800  TempSrc:   PainSc: 3          Complications: No apparent anesthesia complications
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
## 2019-03-02 NOTE — Progress Notes (Signed)
PT Cancellation Note  Patient Details Name: Ryin Quinones MRN: [id number] DOB: Jun 21, 1965   Cancelled Treatment:    Reason Eval/Treat Not Completed: Patient at procedure or test/unavailable. Pt in OR for ORIF R foot.   Lorriane Shire 03/02/2019, 8:35 AM   Lorrin Goodell, PT  Office # [phone] Pager [phone]
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
## 2019-03-02 NOTE — Anesthesia Postprocedure Evaluation (Signed)
Anesthesia Post Note  Patient: Donna Black  Procedure(s) Performed: OPEN REDUCTION INTERNAL FIXATION (ORIF) FOOT LISFRANC FRACTURE (Right Foot)     Patient location during evaluation: PACU Anesthesia Type: General Level of consciousness: awake and alert Pain management: pain level controlled Vital Signs Assessment: post-procedure vital signs reviewed and stable Respiratory status: spontaneous breathing, nonlabored ventilation, respiratory function stable and patient connected to nasal cannula oxygen Cardiovascular status: blood pressure returned to baseline and stable Postop Assessment: no apparent nausea or vomiting Anesthetic complications: no    Last Vitals:  Vitals:   03/02/19 1327 03/02/19 1343  BP: 130/72 122/75  Pulse: (!) 105 (!) 104  Resp: 17 16  Temp:  36.7 C  SpO2: 94% 95%    Last Pain:  Vitals:   03/02/19 1343  TempSrc:   PainSc: [place] A Taleigha Pinson

## 2019-03-02 NOTE — Anesthesia Procedure Notes (Signed)
Procedure Name: LMA Insertion Date/Time: 03/02/2019 10:05 AM Performed by: Glynda Jaeger, CRNA Pre-anesthesia Checklist: Patient identified, Emergency Drugs available, Suction available and Patient being monitored Patient Re-evaluated:Patient Re-evaluated prior to induction Oxygen Delivery Method: Circle System Utilized Preoxygenation: Pre-oxygenation with 100% oxygen Induction Type: IV induction Ventilation: Mask ventilation without difficulty LMA: LMA inserted LMA Size: 4.0 Number of attempts: 1 Airway Equipment and Method: Bite block Placement Confirmation: positive ETCO2 Tube secured with: Tape Dental Injury: Teeth and Oropharynx as per pre-operative assessment
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
## 2019-03-02 NOTE — Anesthesia Preprocedure Evaluation (Signed)
Anesthesia Evaluation  Patient identified by MRN, date of birth, ID band Patient awake    Reviewed: Allergy & Precautions, NPO status , Patient's Chart, lab work & pertinent test results  Airway Mallampati: IV  TM Distance: >3 FB Neck ROM: Full    Dental no notable dental hx. (+) Teeth Intact, Dental Advisory Given   Pulmonary COPD, Current Smoker and Patient abstained from smoking.,    Pulmonary exam normal breath sounds clear to auscultation       Cardiovascular hypertension, Pt. on medications Normal cardiovascular exam Rhythm:Regular Rate:Normal     Neuro/Psych PSYCHIATRIC DISORDERS negative neurological ROS     GI/Hepatic Neg liver ROS, GERD  ,  Endo/Other  negative endocrine ROS  Renal/GU negative Renal ROS     Musculoskeletal negative musculoskeletal ROS (+)   Abdominal   Peds  Hematology negative hematology ROS (+)   Anesthesia Other Findings   Reproductive/Obstetrics negative OB ROS                             Anesthesia Physical Anesthesia Plan  ASA: III  Anesthesia Plan: General   Post-op Pain Management:    Induction: Intravenous  PONV Risk Score and Plan:   Airway Management Planned: LMA  Additional Equipment: None  Intra-op Plan:   Post-operative Plan:   Informed Consent: I have reviewed the patients History and Physical, chart, labs and discussed the procedure including the risks, benefits and alternatives for the proposed anesthesia with the patient or authorized representative who has indicated his/her understanding and acceptance.     Dental advisory given  Plan Discussed with: CRNA  Anesthesia Plan Comments:         Anesthesia Quick Evaluation

## 2019-03-03 LAB — BASIC METABOLIC PANEL
Anion gap: 11 (ref 5–15)
BUN: 9 mg/dL (ref 6–20)
CO2: 25 mmol/L (ref 22–32)
Calcium: 8.5 mg/dL — ABNORMAL LOW (ref 8.9–10.3)
Chloride: 102 mmol/L (ref 98–111)
Creatinine, Ser: 0.55 mg/dL (ref 0.44–1.00)
GFR calc Af Amer: >60 mL/min (ref >60)
GFR calc non Af Amer: >60 mL/min (ref >60)
Glucose, Bld: 130 mg/dL — ABNORMAL HIGH (ref 70–99)
Potassium: 3.6 mmol/L (ref 3.5–5.1)
Sodium: 138 mmol/L (ref 135–145)

## 2019-03-03 LAB — CBC
HCT: 26.5 % — ABNORMAL LOW (ref 36.0–46.0)
Hemoglobin: 8.6 g/dL — ABNORMAL LOW (ref 12.0–15.0)
MCH: 32 pg (ref 26.0–34.0)
MCHC: 32.5 g/dL (ref 30.0–36.0)
MCV: 98.5 fL (ref 80.0–100.0)
Platelets: 148 10*3/uL — ABNORMAL LOW (ref 150–400)
RBC: 2.69 MIL/uL — ABNORMAL LOW (ref 3.87–5.11)
RDW: 13.7 % (ref 11.5–15.5)
WBC: 7.2 10*3/uL (ref 4.0–10.5)
nRBC: 0 % (ref 0.0–0.2)

## 2019-03-03 LAB — MAGNESIUM: Magnesium: 2.2 mg/dL (ref 1.7–2.4)

## 2019-03-03 LAB — PHOSPHORUS: Phosphorus: 4.1 mg/dL (ref 2.5–4.6)

## 2019-03-03 MED ORDER — ARIPIPRAZOLE 5 MG PO TABS
20.0000 mg | ORAL_TABLET | Freq: Every day | ORAL | Status: DC
  Administered 2019-03-03 – 2019-03-11 (×9): 20 mg via ORAL
  Filled 2019-03-03 (×5): qty 2
  Filled 2019-03-03: qty 4
  Filled 2019-03-03 (×6): qty 2
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
## 2019-03-04 DIAGNOSIS — S93324A Dislocation of tarsometatarsal joint of right foot, initial encounter: Secondary | ICD-10-CM

## 2019-03-04 DIAGNOSIS — T1491XA Suicide attempt, initial encounter: Secondary | ICD-10-CM

## 2019-03-04 DIAGNOSIS — S72002A Fracture of unspecified part of neck of left femur, initial encounter for closed fracture: Secondary | ICD-10-CM

## 2019-03-04 DIAGNOSIS — S92351B Displaced fracture of fifth metatarsal bone, right foot, initial encounter for open fracture: Secondary | ICD-10-CM

## 2019-03-04 DIAGNOSIS — S72352A Displaced comminuted fracture of shaft of left femur, initial encounter for closed fracture: Secondary | ICD-10-CM

## 2019-03-04 DIAGNOSIS — S86311A Strain of muscle(s) and tendon(s) of peroneal muscle group at lower leg level, right leg, initial encounter: Secondary | ICD-10-CM

## 2019-03-04 DIAGNOSIS — S81812A Laceration without foreign body, left lower leg, initial encounter: Secondary | ICD-10-CM

## 2019-03-04 DIAGNOSIS — S82871C Displaced pilon fracture of right tibia, initial encounter for open fracture type IIIA, IIIB, or IIIC: Secondary | ICD-10-CM | POA: Insufficient documentation

## 2019-03-04 DIAGNOSIS — S92331A Displaced fracture of third metatarsal bone, right foot, initial encounter for closed fracture: Secondary | ICD-10-CM

## 2019-03-04 MED ORDER — POLYETHYLENE GLYCOL 3350 17 G PO PACK
17.0000 g | PACK | Freq: Two times a day (BID) | ORAL | Status: DC
  Administered 2019-03-04 – 2019-03-24 (×21): 17 g via ORAL
  Filled 2019-03-04 (×34): qty 1

## 2019-03-04 MED ORDER — DOCUSATE SODIUM 100 MG PO CAPS
100.0000 mg | ORAL_CAPSULE | Freq: Every day | ORAL | Status: DC
  Administered 2019-03-04: 100 mg via ORAL
  Filled 2019-03-04: qty 1
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
## 2019-03-05 ENCOUNTER — Encounter: Payer: Self-pay | Admitting: *Deleted

## 2019-03-05 LAB — BASIC METABOLIC PANEL
Anion gap: 9 (ref 5–15)
BUN: 13 mg/dL (ref 6–20)
CO2: 27 mmol/L (ref 22–32)
Calcium: 8.6 mg/dL — ABNORMAL LOW (ref 8.9–10.3)
Chloride: 102 mmol/L (ref 98–111)
Creatinine, Ser: 0.61 mg/dL (ref 0.44–1.00)
GFR calc Af Amer: >60 mL/min (ref >60)
GFR calc non Af Amer: >60 mL/min (ref >60)
Glucose, Bld: 102 mg/dL — ABNORMAL HIGH (ref 70–99)
Potassium: 3.3 mmol/L — ABNORMAL LOW (ref 3.5–5.1)
Sodium: 138 mmol/L (ref 135–145)

## 2019-03-05 LAB — CBC
HCT: 29.3 % — ABNORMAL LOW (ref 36.0–46.0)
Hemoglobin: 9.7 g/dL — ABNORMAL LOW (ref 12.0–15.0)
MCH: 32.3 pg (ref 26.0–34.0)
MCHC: 33.1 g/dL (ref 30.0–36.0)
MCV: 97.7 fL (ref 80.0–100.0)
Platelets: 221 10*3/uL (ref 150–400)
RBC: 3 MIL/uL — ABNORMAL LOW (ref 3.87–5.11)
RDW: 14.1 % (ref 11.5–15.5)
WBC: 7.5 10*3/uL (ref 4.0–10.5)
nRBC: 0.3 % — ABNORMAL HIGH (ref 0.0–0.2)

## 2019-03-05 LAB — MAGNESIUM: Magnesium: 1.9 mg/dL (ref 1.7–2.4)

## 2019-03-05 LAB — PHOSPHORUS: Phosphorus: 3.4 mg/dL (ref 2.5–4.6)

## 2019-03-05 MED ORDER — DOCUSATE SODIUM 100 MG PO CAPS
100.0000 mg | ORAL_CAPSULE | Freq: Two times a day (BID) | ORAL | Status: DC
  Administered 2019-03-05 – 2019-03-24 (×37): 100 mg via ORAL
  Filled 2019-03-05 (×43): qty 1

## 2019-03-05 MED ORDER — TAMSULOSIN HCL 0.4 MG PO CAPS
0.4000 mg | ORAL_CAPSULE | Freq: Every day | ORAL | Status: DC
  Administered 2019-03-05 – 2019-03-25 (×21): 0.4 mg via ORAL
  Filled 2019-03-05 (×22): qty 1

## 2019-03-05 MED ORDER — OXYCODONE HCL 5 MG/5ML PO SOLN
5.0000 mg | ORAL | Status: DC | PRN
  Administered 2019-03-05 – 2019-03-06 (×4): 10 mg via ORAL
  Administered 2019-03-07: 5 mg via ORAL
  Administered 2019-03-07 – 2019-03-10 (×6): 10 mg via ORAL
  Administered 2019-03-10: 5 mg via ORAL
  Administered 2019-03-10 – 2019-03-11 (×3): 10 mg via ORAL
  Administered 2019-03-12: 5 mg via ORAL
  Administered 2019-03-13: 02:00:00 10 mg via ORAL
  Administered 2019-03-13 – 2019-03-14 (×2): 5 mg via ORAL
  Administered 2019-03-14 – 2019-03-15 (×3): 10 mg via ORAL
  Filled 2019-03-05: qty 5
  Filled 2019-03-05 (×5): qty 10
  Filled 2019-03-05: qty 5
  Filled 2019-03-05 (×9): qty 10
  Filled 2019-03-05: qty 5
  Filled 2019-03-05 (×2): qty 10
  Filled 2019-03-05: qty 5
  Filled 2019-03-05 (×2): qty 10

## 2019-03-05 MED ORDER — BISACODYL 5 MG PO TBEC
5.0000 mg | DELAYED_RELEASE_TABLET | Freq: Once | ORAL | Status: AC
  Administered 2019-03-05: 5 mg via ORAL
  Filled 2019-03-05: qty 1

## 2019-03-05 MED ORDER — POTASSIUM CHLORIDE CRYS ER 20 MEQ PO TBCR
40.0000 meq | EXTENDED_RELEASE_TABLET | Freq: Two times a day (BID) | ORAL | Status: AC
  Administered 2019-03-05 (×2): 40 meq via ORAL
  Filled 2019-03-05 (×2): qty 2

## 2019-03-05 MED ORDER — BISACODYL 10 MG RE SUPP
10.0000 mg | Freq: Once | RECTAL | Status: AC
  Administered 2019-03-06: 11:00:00 10 mg via RECTAL
  Filled 2019-03-05: qty 1

## 2019-03-05 MED ORDER — SENNA 8.6 MG PO TABS
1.0000 | ORAL_TABLET | Freq: Every day | ORAL | Status: DC
  Administered 2019-03-05 – 2019-03-06 (×2): 8.6 mg via ORAL
  Filled 2019-03-05 (×2): qty 1

## 2019-03-05 NOTE — Plan of Care (Signed)
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
## 2019-03-05 NOTE — Progress Notes (Signed)
CSW received a phone call from [facility]. They were needing some additional information on the patient. They are interested in her and are reviewing her for an inpatient treatment bed. The patient would have to be medically cleared before they would be able to accept her. CSW provided [facility] contact information so that admissions can call and gain more information on the patient.   CSW will continue to follow and assist with disposition placement as needed.   Domenic Schwab, MSW, LCSW-A Clinical Disposition Social Worker [facility]/TTS [phone]
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
## 2019-03-05 NOTE — TOC Progression Note (Signed)
Transition of Care [facility]) - Progression Note    Patient Details  Name: [name] MRN: [id number] Date of Birth: 1965/08/08  Transition of Care [facility]) CM/SW Contact  Ella Bodo, RN Phone Number: 03/05/2019, 3:31 PM  Clinical Narrative:  Referrals made to [facility], [facility], and [facility].  Pt must have 3 psych facility denials prior to referral being made to [place] denials from these 3 facilities due to increased medical needs of patient; once received, will proceed with [facility] referral.       Expected Discharge Plan: Psychiatric Hospital Barriers to Discharge: Continued Medical Work up  Expected Discharge Plan and Services Expected Discharge Plan: [facility]   Discharge Planning Services: CM Consult   Living arrangements for the past 2 months: Single Family Home                                       Social Determinants of Health (SDOH) Interventions    Readmission Risk Interventions No flowsheet data found.  Reinaldo Raddle, RN, BSN  Trauma/Neuro ICU Case Manager [phone]

## 2019-03-06 LAB — BASIC METABOLIC PANEL
Anion gap: 9 (ref 5–15)
BUN: 11 mg/dL (ref 6–20)
CO2: 26 mmol/L (ref 22–32)
Calcium: 8.4 mg/dL — ABNORMAL LOW (ref 8.9–10.3)
Chloride: 103 mmol/L (ref 98–111)
Creatinine, Ser: 0.54 mg/dL (ref 0.44–1.00)
GFR calc Af Amer: >60 mL/min (ref >60)
GFR calc non Af Amer: >60 mL/min (ref >60)
Glucose, Bld: 103 mg/dL — ABNORMAL HIGH (ref 70–99)
Potassium: 3.9 mmol/L (ref 3.5–5.1)
Sodium: 138 mmol/L (ref 135–145)

## 2019-03-06 MED ORDER — ENOXAPARIN SODIUM 40 MG/0.4ML ~~LOC~~ SOLN
40.0000 mg | SUBCUTANEOUS | Status: DC
  Administered 2019-03-06 – 2019-03-26 (×21): 40 mg via SUBCUTANEOUS
  Filled 2019-03-06 (×21): qty 0.4
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
## 2019-03-06 NOTE — TOC Progression Note (Addendum)
Transition of Care [facility]) - Progression Note    Patient Details  Name: Donna Black MRN: [id number] Date of Birth: 07/20/1965  Transition [facility] CM/SW Contact  Ella Bodo, RN Phone Number: 03/06/2019, 10:55 AM  Clinical Narrative:   Received denials from all three behavioral facilities [facility], [facility], and [facility]).  Will proceed with [facility] Psych referral, as pt has medical needs that cannot be met at a general [facility].  Pt will require IVC for referral to [facility].  Addendum: (1740pm)  Completed [facility] Referral faxed to psych facility.  IVC paperwork completed and filed with the magistrate.  Called non-emergent law enforcement to serve IVC paperwork at 1740pm.    Expected Discharge Plan: [facility] Barriers to Discharge: Continued Medical Work up  Expected Discharge Plan and Services Expected Discharge Plan: [facility]   Discharge Planning Services: CM Consult   Living arrangements for the past 2 months: Single Family Home                                       Social Determinants of Health (SDOH) Interventions    Readmission Risk Interventions No flowsheet data found.  Reinaldo Raddle, RN, BSN  Trauma/Neuro ICU Case Manager [phone]

## 2019-03-07 LAB — CBC
HCT: 27.6 % — ABNORMAL LOW (ref 36.0–46.0)
Hemoglobin: 9.3 g/dL — ABNORMAL LOW (ref 12.0–15.0)
MCH: 33.2 pg (ref 26.0–34.0)
MCHC: 33.7 g/dL (ref 30.0–36.0)
MCV: 98.6 fL (ref 80.0–100.0)
Platelets: 285 10*3/uL (ref 150–400)
RBC: 2.8 MIL/uL — ABNORMAL LOW (ref 3.87–5.11)
RDW: 14.7 % (ref 11.5–15.5)
WBC: 8.3 10*3/uL (ref 4.0–10.5)
nRBC: 0.2 % (ref 0.0–0.2)

## 2019-03-07 MED ORDER — BISACODYL 10 MG RE SUPP
10.0000 mg | Freq: Once | RECTAL | Status: DC
  Filled 2019-03-07: qty 1

## 2019-03-07 MED ORDER — SENNA 8.6 MG PO TABS
1.0000 | ORAL_TABLET | Freq: Two times a day (BID) | ORAL | Status: DC
  Administered 2019-03-07 – 2019-03-24 (×30): 8.6 mg via ORAL
  Filled 2019-03-07 (×39): qty 1

## 2019-03-07 MED ORDER — METHOCARBAMOL 500 MG PO TABS
1000.0000 mg | ORAL_TABLET | Freq: Three times a day (TID) | ORAL | Status: DC
  Administered 2019-03-07 – 2019-03-20 (×39): 1000 mg via ORAL
  Administered 2019-03-20: 500 mg via ORAL
  Administered 2019-03-21 – 2019-03-26 (×16): 1000 mg via ORAL
  Filled 2019-03-07 (×56): qty 2

## 2019-03-07 MED ORDER — MAGNESIUM CITRATE PO SOLN
1.0000 | Freq: Once | ORAL | Status: AC
  Administered 2019-03-07: 1 via ORAL
  Filled 2019-03-07: qty 296
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
## 2019-03-07 NOTE — Plan of Care (Signed)
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
## 2019-03-08 MED ORDER — MECLIZINE HCL 12.5 MG PO TABS
12.5000 mg | ORAL_TABLET | Freq: Three times a day (TID) | ORAL | Status: DC | PRN
  Filled 2019-03-08: qty 1
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
## 2019-03-08 NOTE — TOC Progression Note (Addendum)
Transition of Care [facility]) - Progression Note    Patient Details  Name: Donna Black MRN: [id number] Date of Birth: 21-Sep-1965  [facility]) CM/SW Contact  Ella Bodo, RN Phone Number: 03/08/2019, 5:18 PM  Clinical Narrative:  Met with pt; gave update on referral process to [facility] for psychiatric treatment.  Faxed ortho and PT/OT recommendations to [facility], per their request.    SBIRT completed; pt admits to heavy, daily ETOH use.  She states she has never been to any sort of ETOH rehab, but has entertained going to [facility].  She is tearful, and open to resources.  Will provide ETOH counseling/rehab resources for patient.     Updated pt's daughter, Christeen Douglas, regarding inpatient psych placement.  Daughter states she and her sister have been able to obtain temporary guardianship, and will have a hearing on 2/23 for permanent guardianship.    Expected Discharge Plan: Psychiatric Hospital Barriers to Discharge: Continued Medical Work up  Expected Discharge Plan and Services Expected Discharge Plan: [facility]   Discharge Planning Services: CM Consult   Living arrangements for the past 2 months: Single Family Home                                       Social Determinants of Health (SDOH) Interventions    Readmission Risk Interventions No flowsheet data found.  Reinaldo Raddle, RN, BSN  Trauma/Neuro ICU Case Manager [phone]
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
## 2019-03-08 NOTE — Plan of Care (Signed)

## 2019-03-09 MED ORDER — GABAPENTIN 100 MG PO CAPS
200.0000 mg | ORAL_CAPSULE | Freq: Three times a day (TID) | ORAL | Status: DC
  Administered 2019-03-09 – 2019-03-26 (×51): 200 mg via ORAL
  Filled 2019-03-09 (×51): qty 2
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
## 2019-03-09 NOTE — TOC Progression Note (Signed)
Transition of Care [facility]) - Progression Note    Patient Details  Name: Donna Black MRN: [id number] Date of Birth: 11-09-1965  Transition [facility] Care [facility] CM/SW Contact  Ella Bodo, RN Phone Number: 03/09/2019, 5:05 PM  Clinical Narrative:  Notified by [facility] that pt will not be accepted on wait list, as their medical unit is currently housing all of their COVID patients.  Patient will likely have to remain in house until cleared by psychiatrist, or until she becomes able to ambulate and be admitted to [facility].  Would recommend at least weekly psychiatric follow ups, if able.      Expected Discharge Plan: Psychiatric Hospital Barriers to Discharge: Continued Medical Work up  Expected Discharge Plan and Services Expected Discharge Plan: [facility]   Discharge Planning Services: CM Consult   Living arrangements for the past 2 months: Single Family Home                                       Social Determinants of Health (SDOH) Interventions    Readmission Risk Interventions No flowsheet data found.  Reinaldo Raddle, RN, BSN  Trauma/Neuro ICU Case Manager [phone]
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
## 2019-03-09 NOTE — Care Management (Signed)
Per [facility], patient case is still in review, and she has not been placed on the admissions list as of yet.  Case Manager to be notified when patient approved for waiting list for admission.  No additional clinical information needed at this time.   Reinaldo Raddle, RN, BSN  Trauma/Neuro ICU Case Manager [phone]
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
## 2019-03-12 DIAGNOSIS — F3131 Bipolar disorder, current episode depressed, mild: Secondary | ICD-10-CM

## 2019-03-12 DIAGNOSIS — F331 Major depressive disorder, recurrent, moderate: Secondary | ICD-10-CM | POA: Diagnosis present

## 2019-03-12 MED ORDER — ARIPIPRAZOLE ER 400 MG IM SRER
400.0000 mg | Freq: Once | INTRAMUSCULAR | Status: AC
  Administered 2019-03-12: 14:00:00 400 mg via INTRAMUSCULAR
  Filled 2019-03-12: qty 2
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
## 2019-03-12 NOTE — TOC Progression Note (Signed)
Transition of Care [facility]) - Progression Note    Patient Details  Name: Donna Black MRN: [id number] Date of Birth: 14-Jan-1966  Transition of Care [facility]) CM/SW Contact  Ella Bodo, RN Phone Number: 03/12/2019, 5:24 PM  Clinical Narrative:  Pt has been cleared by psych MD for inpatient psych admission.  Met with pt and her daughter, [name] to discuss discharge options:  PT/OT recommending SNF; pt is uninsured, and pt will need SNF with LOG.  Daughter is uncertain the amount of support that she and her other siblings will be able to provide at discharge, but they do agree that pt will need some sort of rehab prior to returning home.  Pt states she is not able to pay privately for SNF.   Will seek approval for SNF with LOG (likley for 30 days only), with the assumption that family will come up with a plan to care for pt when the 30 days is up.  [name] states she will discuss plan with family and see what they can come up with.  Will initiate FL2 and start bed search.  Made pt and daughter aware that this may be a difficult process, due to COVID and barriers with facilities.  They understand this.        [facility] Discharge [facility]: [facility] Barriers to Discharge: SNF Pending payor source - LOG  Expected Discharge Plan and Services Expected Discharge Plan: [facility]   Discharge Planning Services: CM Consult   Living arrangements for the past 2 months: Single Family Home                                       Social Determinants of Health (SDOH) Interventions    Readmission Risk Interventions No flowsheet data found.  Reinaldo Raddle, RN, BSN  Trauma/Neuro ICU Case Manager [phone]

## 2019-03-12 NOTE — Progress Notes (Signed)
Orthopedic Tech Progress Note Patient Details:  Donna Black 1965/06/08 [id number] After applying the CAM WALKER to patient she said she was having some discomfort where the stitches are at on her ankle and where the boot was pressing up against.. I did remove the boot but patient did mention this is the first time trying to wear anything on the foot. Ortho Devices Type of Ortho Device: CAM walker Ortho Device/Splint Location: RLE Ortho Device/Splint Interventions: Application, Ordered   Post Interventions Patient Tolerated: Fair Instructions Provided: Care of device, Adjustment of device   Janit Pagan 03/12/2019, 4:55 PM
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
## 2019-03-12 NOTE — Plan of Care (Signed)
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
## 2019-03-13 ENCOUNTER — Inpatient Hospital Stay (HOSPITAL_COMMUNITY): Payer: No Typology Code available for payment source

## 2019-03-13 LAB — CBC
HCT: 30.7 % — ABNORMAL LOW (ref 36.0–46.0)
Hemoglobin: 9.8 g/dL — ABNORMAL LOW (ref 12.0–15.0)
MCH: 32.6 pg (ref 26.0–34.0)
MCHC: 31.9 g/dL (ref 30.0–36.0)
MCV: 102 fL — ABNORMAL HIGH (ref 80.0–100.0)
Platelets: 400 10*3/uL (ref 150–400)
RBC: 3.01 MIL/uL — ABNORMAL LOW (ref 3.87–5.11)
RDW: 15 % (ref 11.5–15.5)
WBC: 8.1 10*3/uL (ref 4.0–10.5)
nRBC: 0 % (ref 0.0–0.2)

## 2019-03-13 LAB — BASIC METABOLIC PANEL
Anion gap: 10 (ref 5–15)
BUN: 14 mg/dL (ref 6–20)
CO2: 24 mmol/L (ref 22–32)
Calcium: 8.9 mg/dL (ref 8.9–10.3)
Chloride: 106 mmol/L (ref 98–111)
Creatinine, Ser: 0.71 mg/dL (ref 0.44–1.00)
GFR calc Af Amer: >60 mL/min (ref >60)
GFR calc non Af Amer: >60 mL/min (ref >60)
Glucose, Bld: 105 mg/dL — ABNORMAL HIGH (ref 70–99)
Potassium: 4 mmol/L (ref 3.5–5.1)
Sodium: 140 mmol/L (ref 135–145)
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
## 2019-03-13 NOTE — Plan of Care (Signed)

## 2019-03-13 NOTE — Discharge Instructions (Signed)
Orthopaedic Trauma Service Discharge Instructions   General Discharge Instructions  WEIGHT BEARING STATUS: Non-weightbearing bilateral lower extremtiies  RANGE OF MOTION/ACTIVITY: Okay for gentle range of motion of left and right hips, ankle, knees  Wound Care: Incisions can be left open to air if there is no drainage. If incision continues to have drainage, follow wound care instructions below. Okay to shower if no drainage from incisions.  DVT/PE prophylaxis: Lovenox x 14 additional days  Diet: as you were eating previously.  Can use over the counter stool softeners and bowel preparations, such as Miralax, to help with bowel movements.  Narcotics can be constipating.  Be sure to drink plenty of fluids  PAIN MEDICATION USE AND EXPECTATIONS  You have likely been given narcotic medications to help control your pain.  After a traumatic event that results in an fracture (broken bone) with or without surgery, it is ok to use narcotic pain medications to help control one's pain.  We understand that everyone responds to pain differently and each individual patient will be evaluated on a regular basis for the continued need for narcotic medications. Ideally, narcotic medication use should last no more than 6-8 weeks (coinciding with fracture healing).   As a patient it is your responsibility as well to monitor narcotic medication use and report the amount and frequency you use these medications when you come to your office visit.   We would also advise that if you are using narcotic medications, you should take a dose prior to therapy to maximize you participation.  IF YOU ARE ON NARCOTIC MEDICATIONS IT IS NOT PERMISSIBLE TO OPERATE A MOTOR VEHICLE (MOTORCYCLE/CAR/TRUCK/MOPED) OR HEAVY MACHINERY DO NOT MIX NARCOTICS WITH OTHER CNS (CENTRAL NERVOUS SYSTEM) DEPRESSANTS SUCH AS ALCOHOL   STOP SMOKING OR USING NICOTINE PRODUCTS!!!!  As discussed nicotine severely impairs your body's ability to heal  surgical and traumatic wounds but also impairs bone healing.  Wounds and bone heal by forming microscopic blood vessels (angiogenesis) and nicotine is a vasoconstrictor (essentially, shrinks blood vessels).  Therefore, if vasoconstriction occurs to these microscopic blood vessels they essentially disappear and are unable to deliver necessary nutrients to the healing tissue.  This is one modifiable factor that you can do to dramatically increase your chances of healing your injury.    (This means no smoking, no nicotine gum, patches, etc)  DO NOT USE NONSTEROIDAL ANTI-INFLAMMATORY DRUGS (NSAID'S)  Using products such as Advil (ibuprofen), Aleve (naproxen), Motrin (ibuprofen) for additional pain control during fracture healing can delay and/or prevent the healing response.  If you would like to take over the counter (OTC) medication, Tylenol (acetaminophen) is ok.  However, some narcotic medications that are given for pain control contain acetaminophen as well. Therefore, you should not exceed more than 4000 mg of tylenol in a day if you do not have liver disease.  Also note that there are may OTC medicines, such as cold medicines and allergy medicines that my contain tylenol as well.  If you have any questions about medications and/or interactions please ask your doctor/PA or your pharmacist.      ICE AND ELEVATE INJURED/OPERATIVE EXTREMITY  Using ice and elevating the injured extremity above your heart can help with swelling and pain control.  Icing in a pulsatile fashion, such as 20 minutes on and 20 minutes off, can be followed.    Do not place ice directly on skin. Make sure there is a barrier between to skin and the ice pack.    Using  frozen items such as frozen peas works well as the conform nicely to the are that needs to be iced.  USE AN ACE WRAP OR TED HOSE FOR SWELLING CONTROL  In addition to icing and elevation, Ace wraps or TED hose are used to help limit and resolve swelling.  It is  recommended to use Ace wraps or TED hose until you are informed to stop.    When using Ace Wraps start the wrapping distally (farthest away from the body) and wrap proximally (closer to the body)   Example: If you had surgery on your leg or thing and you do not have a splint on, start the ace wrap at the toes and work your way up to the thigh        If you had surgery on your upper extremity and do not have a splint on, start the ace wrap at your fingers and work your way up to the upper arm  IF YOU ARE IN A SPLINT OR CAST DO NOT [place]   If your splint gets wet for any reason please contact the office immediately. You may shower in your splint or cast as long as you keep it dry.  This can be done by wrapping in a cast cover or garbage back (or similar)  Do Not stick any thing down your splint or cast such as pencils, money, or hangers to try and scratch yourself with.  If you feel itchy take benadryl as prescribed on the bottle for itching  IF YOU ARE IN A CAM BOOT (BLACK BOOT)  You may remove boot periodically. Perform daily dressing changes as noted below.  Wash the liner of the boot regularly and wear a sock when wearing the boot. It is recommended that you sleep in the boot until told otherwise   CALL THE OFFICE WITH ANY QUESTIONS OR CONCERNS: [phone]   VISIT OUR WEBSITE FOR ADDITIONAL INFORMATION: [email or web address]      Discharge Pin Site Instructions  Dress pins daily with Kerlix roll starting on POD 2. Wrap the Kerlix so that it tamps the skin down around the pin-skin interface to prevent/limit motion of the skin relative to the pin.  (Pin-skin motion is the primary cause of pain and infection related to external fixator pin sites).  Remove any crust or coagulum that may obstruct drainage with a saline moistened gauze or soap and water.  After POD 3, if there is no discernable drainage on the pin site dressing, the interval for change can by increased to  every other day.  You may shower with the fixator, cleaning all pin sites gently with soap and water.  If you have a surgical wound this needs to be completely dry and without drainage before showering.  The extremity can be lifted by the fixator to facilitate wound care and transfers.  Notify the office/Doctor if you experience increasing drainage, redness, or pain from a pin site, or if you notice purulent (thick, snot-like) drainage.  Discharge Wound Care Instructions  Do NOT apply any ointments, solutions or lotions to pin sites or surgical wounds.  These prevent needed drainage and even though solutions like hydrogen peroxide kill bacteria, they also damage cells lining the pin sites that help fight infection.  Applying lotions or ointments can keep the wounds moist and can cause them to breakdown and open up as well. This can increase the risk for infection. When in doubt call the office.  Surgical incisions should be  dressed daily.  If any drainage is noted, use one layer of adaptic, then gauze, Kerlix, and an ace wrap.  Once the incision is completely dry and without drainage, it may be left open to air out.  Showering may begin 36-48 hours later.  Cleaning gently with soap and water.  Traumatic wounds should be dressed daily as well.    One layer of adaptic, gauze, Kerlix, then ace wrap.  The adaptic can be discontinued once the draining has ceased    If you have a wet to dry dressing: wet the gauze with saline the squeeze as much saline out so the gauze is moist (not soaking wet), place moistened gauze over wound, then place a dry gauze over the moist one, followed by Kerlix wrap, then ace wrap.

## 2019-03-13 NOTE — Plan of Care (Signed)
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
## 2019-03-14 ENCOUNTER — Encounter (HOSPITAL_COMMUNITY): Payer: Self-pay

## 2019-03-14 NOTE — Progress Notes (Signed)
Pt refused abilify, states she now gets a 30 day shot for that.
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
## 2019-03-14 NOTE — Plan of Care (Signed)
  Problem: Education: Goal: Knowledge of General Education information will improve Description: Including pain rating scale, medication(s)/side effects and non-pharmacologic comfort measures Outcome: Progressing   Problem: Clinical Measurements: Goal: Respiratory complications will improve Outcome: Progressing Note: On room air   Problem: Activity: Goal: Risk for activity intolerance will decrease Outcome: Progressing Note: Up to chair for a few hours   Problem: Nutrition: Goal: Adequate nutrition will be maintained Outcome: Progressing   Problem: Coping: Goal: Level of anxiety will decrease Outcome: Progressing   Problem: Elimination: Goal: Will not experience complications related to bowel motility Outcome: Progressing Note: BM this shift   Problem: Pain Managment: Goal: General experience of comfort will improve Outcome: Progressing Note: Treated for rib cage pain once and left groin pain once with oxycodone   Problem: Safety: Goal: Ability to remain free from injury will improve Outcome: Progressing
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
## 2019-03-14 NOTE — Plan of Care (Signed)

## 2019-03-15 MED ORDER — OXYCODONE HCL 5 MG PO TABS
5.0000 mg | ORAL_TABLET | ORAL | Status: DC | PRN
  Administered 2019-03-15: 5 mg via ORAL
  Administered 2019-03-15 – 2019-03-21 (×17): 10 mg via ORAL
  Administered 2019-03-22 (×2): 5 mg via ORAL
  Administered 2019-03-23 – 2019-03-26 (×7): 10 mg via ORAL
  Filled 2019-03-15: qty 1
  Filled 2019-03-15: qty 2
  Filled 2019-03-15: qty 1
  Filled 2019-03-15 (×17): qty 2
  Filled 2019-03-15: qty 1
  Filled 2019-03-15 (×9): qty 2
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
## 2019-03-15 NOTE — Plan of Care (Signed)
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
## 2019-03-16 NOTE — Plan of Care (Signed)
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
## 2019-03-16 NOTE — Plan of Care (Signed)
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
## 2019-03-17 NOTE — Plan of Care (Signed)

## 2019-03-18 NOTE — Plan of Care (Signed)
  Problem: Health Behavior/Discharge Planning: Goal: Ability to manage health-related needs will improve Outcome: Progressing   Problem: Activity: Goal: Risk for activity intolerance will decrease Outcome: Progressing   Problem: Pain Managment: Goal: General experience of comfort will improve Outcome: Progressing   
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
## 2019-03-18 NOTE — Plan of Care (Signed)
  Problem: Coping: Goal: Level of anxiety will decrease Outcome: Progressing   Problem: Pain Managment: Goal: General experience of comfort will improve Outcome: Progressing   Problem: Safety: Goal: Ability to remain free from injury will improve Outcome: Progressing   Problem: Skin Integrity: Goal: Risk for impaired skin integrity will decrease Outcome: Progressing   

## 2019-03-18 NOTE — Plan of Care (Signed)

## 2019-03-19 ENCOUNTER — Encounter (HOSPITAL_COMMUNITY): Payer: Self-pay
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
## 2019-03-20 NOTE — Plan of Care (Signed)
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
## 2019-03-20 NOTE — Progress Notes (Signed)
Sitting up in the recliner- tolerates well. Warm blanket for comfort.
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
## 2019-03-21 NOTE — Plan of Care (Signed)
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
## 2019-03-23 NOTE — TOC Progression Note (Signed)
Transition of Care [facility]) - Progression Note    Patient Details  Name: Donna Black MRN: [id number] Date of Birth: 07/02/1965  Transition of [facility]) CM/SW Contact  [place] Section Cleta Alberts, RN Phone Number: 03/23/2019, 5:22 PM  Clinical Narrative:  Pt planning to dc home on Monday, 03/26/19 with family/HH services as arranged.  All DME has been ordered; hospital bed to be delivered to home from [facility] on Monday, 2/1.  [place] to follow at home for PT/OT; pt has arranged HHA through [facility] 4hrs daily in the mornings for at least the first week.  Will arrange wheelchair van on Monday for transport home.  All DME and HH is private pay and patient has been given prices as quoted by [place] agency.  Pt appreciative of help given.      Expected Discharge Plan: [place] Barriers to Discharge: Equipment Delay  Expected Discharge Plan and Services Expected Discharge Plan: [place]   Discharge Planning Services: CM Consult Post Acute Care Choice: [place] arrangements for the past 2 months: [place]                 DME Arranged: 3-N-1, Hospital bed, Wheelchair manual, Other see comment   Date DME Agency Contacted: 03/22/19 Time DME Agency Contacted: 1600 Representative spoke with at DME Agency: [place] at [place]: PT, [place] Agency: [place] ([place]) Date [place]: 03/23/19 Time HH Agency Contacted: 1000 Representative spoke with at [place]: [place] ([place]) Interventions    Readmission Risk Interventions No flowsheet data found.  Reinaldo Raddle, RN, BSN  Trauma/Neuro ICU Case Manager [phone]
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
## 2019-03-24 MED ORDER — DICLOFENAC SODIUM 1 % EX GEL
2.0000 g | Freq: Four times a day (QID) | CUTANEOUS | Status: DC
  Administered 2019-03-24 – 2019-03-26 (×8): 2 g via TOPICAL
  Filled 2019-03-24: qty 100

## 2019-03-24 NOTE — Plan of Care (Signed)
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
## 2019-03-25 ENCOUNTER — Inpatient Hospital Stay (HOSPITAL_COMMUNITY): Payer: No Typology Code available for payment source

## 2019-03-25 MED ORDER — POLYETHYLENE GLYCOL 3350 17 G PO PACK: 17.0000 g | PACK | Freq: Every day | ORAL | Status: DC | PRN

## 2019-03-25 MED ORDER — LIDOCAINE 5 % EX PTCH
1.0000 | MEDICATED_PATCH | CUTANEOUS | Status: DC
  Administered 2019-03-25 – 2019-03-26 (×2): 1 via TRANSDERMAL
  Filled 2019-03-25 (×2): qty 1
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
## 2019-03-26 ENCOUNTER — Inpatient Hospital Stay (HOSPITAL_COMMUNITY): Payer: No Typology Code available for payment source

## 2019-03-26 DIAGNOSIS — R52 Pain, unspecified: Secondary | ICD-10-CM

## 2019-03-26 MED ORDER — ENOXAPARIN SODIUM 40 MG/0.4ML ~~LOC~~ SOLN: 40.0000 mg | SUBCUTANEOUS | 0 refills | Status: AC

## 2019-03-26 MED ORDER — LIDOCAINE 5 % EX PTCH: MEDICATED_PATCH | CUTANEOUS | 0 refills | Status: AC

## 2019-03-26 MED ORDER — METHOCARBAMOL 500 MG PO TABS: 500.0000 mg | ORAL_TABLET | Freq: Three times a day (TID) | ORAL | 0 refills | Status: AC | PRN

## 2019-03-26 MED ORDER — VITAMIN D3 25 MCG PO TABS: 2000.0000 [IU] | ORAL_TABLET | Freq: Two times a day (BID) | ORAL | 0 refills | Status: AC

## 2019-03-26 MED ORDER — DICLOFENAC SODIUM 1 % EX GEL: 2.0000 g | Freq: Four times a day (QID) | CUTANEOUS | Status: AC

## 2019-03-26 MED ORDER — DOCUSATE SODIUM 100 MG PO CAPS: 100.0000 mg | ORAL_CAPSULE | Freq: Two times a day (BID) | ORAL | 0 refills | Status: AC

## 2019-03-26 MED ORDER — POLYETHYLENE GLYCOL 3350 17 G PO PACK: 17.0000 g | PACK | Freq: Every day | ORAL | 0 refills | Status: AC | PRN

## 2019-03-26 MED ORDER — ADULT MULTIVITAMIN W/MINERALS CH: 1.0000 | ORAL_TABLET | Freq: Every day | ORAL | Status: AC

## 2019-03-26 MED ORDER — ACETAMINOPHEN 500 MG PO TABS: 1000.0000 mg | ORAL_TABLET | Freq: Four times a day (QID) | ORAL | 0 refills | Status: AC | PRN

## 2019-03-26 MED ORDER — GABAPENTIN 100 MG PO CAPS: 200.0000 mg | ORAL_CAPSULE | Freq: Three times a day (TID) | ORAL | 0 refills | Status: AC

## 2019-03-26 MED ORDER — OXYCODONE HCL 10 MG PO TABS: 5.0000 mg | ORAL_TABLET | Freq: Four times a day (QID) | ORAL | 0 refills | Status: AC | PRN

## 2019-03-26 MED FILL — oxyCODONE HCL 10 MG TABS: 10 | 7 days supply | Qty: 30 | Fill #0

## 2019-03-26 MED FILL — LIDOCAINE PATCH 5%: 5 | 30 days supply | Qty: 30 | Fill #0

## 2019-03-26 MED FILL — ENOXAPARIN SODIUM 40 MG/0.4: 40 | 14 days supply | Qty: 6 | Fill #0

## 2019-03-26 MED FILL — METHOCARBAMOL 500 MG TABS: 500 | 5 days supply | Qty: 30 | Fill #0

## 2019-03-26 MED FILL — VITAMIN D3 1,000 UNIT TAB: 25 MCG | 30 days supply | Qty: 120 | Fill #0

## 2019-03-26 MED FILL — GABAPENTIN 100 MG CAPSULE: 100 | 15 days supply | Qty: 90 | Fill #0

## 2019-03-26 NOTE — TOC Transition Note (Signed)
Transition of Care [facility]) - CM/SW Discharge Note   Patient Details  Name: Donna Black MRN: [id number] Date of Birth: December 22, 1965  Transition of Care [facility]) CM/SW Contact:  Ella Bodo, RN Phone Number: 03/26/2019, 4:38 PM   Clinical Narrative: Pt medically stable for discharge home today with family/friends to assist, and [place] follow up.  Arranged WC van pickup for today at 2:30pm, and pt in agreement.  She will call agency at [phone], ext [phone] to make payment arrangements.  Notified bedside nurse of transportation arrangements.        Final next level of care: [place] Barriers to Discharge: Barriers Resolved   Patient Goals and CMS Choice Patient states their goals for this hospitalization and ongoing recovery are:: to get back home CMS Medicare.gov Compare Post Acute Care list provided to:: Patient Choice offered to / list presented to : Patient                        Discharge Plan and Services   Discharge Planning Services: CM Consult, [place] Program, Medication Assistance Post Acute Care Choice: Home Health          DME Arranged: 3-N-1, Hospital bed, Wheelchair manual, Other see comment   Date DME Agency Contacted: 03/22/19 Time DME Agency Contacted: 1600 Representative spoke with at DME Agency: Rise Mu at [place]: PT, OT [facility] Agency: [place] ([place]) Date [place]: 03/23/19 Time HH Agency Contacted: 1000 Representative spoke with at [place]: [place] ([place]) Interventions     Readmission Risk Interventions Readmission Risk Prevention Plan 03/26/2019  Transportation Screening Complete  PCP or Specialist Appt within 5-7 Days Complete  Home Care Screening Complete  Medication Review (RN CM) Complete   Reinaldo Raddle, RN, BSN  Trauma/Neuro ICU Case Manager [phone]
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
## 2019-03-28 ENCOUNTER — Encounter (HOSPITAL_COMMUNITY): Payer: Self-pay | Admitting: Psychiatry

## 2019-03-28 ENCOUNTER — Other Ambulatory Visit: Payer: Self-pay

## 2019-03-28 ENCOUNTER — Ambulatory Visit (INDEPENDENT_AMBULATORY_CARE_PROVIDER_SITE_OTHER): Payer: Self-pay | Admitting: Psychiatry

## 2019-03-28 DIAGNOSIS — F331 Major depressive disorder, recurrent, moderate: Secondary | ICD-10-CM

## 2019-03-28 DIAGNOSIS — F1010 Alcohol abuse, uncomplicated: Secondary | ICD-10-CM

## 2019-03-28 MED ORDER — VENLAFAXINE HCL ER 75 MG PO CP24: 75.0000 mg | ORAL_CAPSULE | Freq: Every day | ORAL | 0 refills | Status: DC

## 2019-03-28 MED ORDER — NALTREXONE HCL 50 MG PO TABS: ORAL_TABLET | ORAL | 1 refills | Status: DC

## 2019-03-28 MED ORDER — ABILIFY MAINTENA 400 MG IM PRSY: 400.0000 mg | PREFILLED_SYRINGE | INTRAMUSCULAR | 1 refills | Status: DC

## 2019-03-28 MED ORDER — LAMOTRIGINE 200 MG PO TABS: 200.0000 mg | ORAL_TABLET | Freq: Every day | ORAL | 0 refills | Status: DC
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
## 2019-04-09 ENCOUNTER — Other Ambulatory Visit: Payer: Self-pay

## 2019-04-09 ENCOUNTER — Ambulatory Visit (INDEPENDENT_AMBULATORY_CARE_PROVIDER_SITE_OTHER): Payer: Self-pay | Admitting: *Deleted

## 2019-04-09 ENCOUNTER — Encounter (HOSPITAL_COMMUNITY): Payer: Self-pay | Admitting: *Deleted

## 2019-04-09 VITALS — BP 128/80 | HR 94

## 2019-04-09 DIAGNOSIS — F331 Major depressive disorder, recurrent, moderate: Secondary | ICD-10-CM

## 2019-04-09 MED ORDER — ARIPIPRAZOLE ER 400 MG IM PRSY
400.0000 mg | PREFILLED_SYRINGE | INTRAMUSCULAR | Status: DC
  Administered 2019-04-09 – 2019-07-24 (×5): 400 mg via INTRAMUSCULAR
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
## 2019-04-25 ENCOUNTER — Ambulatory Visit (INDEPENDENT_AMBULATORY_CARE_PROVIDER_SITE_OTHER): Payer: Self-pay | Admitting: Psychiatry

## 2019-04-25 ENCOUNTER — Encounter (HOSPITAL_COMMUNITY): Payer: Self-pay | Admitting: Psychiatry

## 2019-04-25 ENCOUNTER — Other Ambulatory Visit: Payer: Self-pay

## 2019-04-25 DIAGNOSIS — F1020 Alcohol dependence, uncomplicated: Secondary | ICD-10-CM

## 2019-04-25 DIAGNOSIS — F331 Major depressive disorder, recurrent, moderate: Secondary | ICD-10-CM

## 2019-04-25 DIAGNOSIS — F1010 Alcohol abuse, uncomplicated: Secondary | ICD-10-CM

## 2019-04-25 MED ORDER — LAMOTRIGINE 200 MG PO TABS: 200.0000 mg | ORAL_TABLET | Freq: Every day | ORAL | 0 refills | Status: DC

## 2019-04-25 MED ORDER — VENLAFAXINE HCL ER 75 MG PO CP24: 75.0000 mg | ORAL_CAPSULE | Freq: Every day | ORAL | 0 refills | Status: DC

## 2019-04-25 MED ORDER — ABILIFY MAINTENA 400 MG IM PRSY: 400.0000 mg | PREFILLED_SYRINGE | INTRAMUSCULAR | 1 refills | Status: DC

## 2019-04-25 MED ORDER — NALTREXONE HCL 50 MG PO TABS: 50.0000 mg | ORAL_TABLET | Freq: Every day | ORAL | 0 refills | Status: AC

## 2019-04-25 NOTE — Progress Notes (Signed)
Virtual Visit via Telephone Note  I connected with Donna Black on 04/25/19 at 10:40 AM EST by telephone and verified that I am speaking with the correct person using two identifiers.   I discussed the limitations, risks, security and privacy concerns of performing an evaluation and management service by telephone and the availability of in person appointments. I also discussed with the patient that there may be a patient responsible charge related to this service. The patient expressed understanding and agreed to proceed.   History of Present Illness: Patient was evaluated by phone session.  She is doing better on her medication.  Now her daughter [name] has limited guardianship and she did not contest and she feels happy that her daughter is involved in her treatment plan.  She is complaining of leg pain because of recent fracture and surgery but overall she reported her mood is stable.  She denies any paranoia, hallucination, irritability.  She is taking Abilify injection and she realized it is helping her mood a lot.  She is also compliant with naltrexone and she did not have any more craving and she has not been drinking since she left the hospital.  She is compliant with Lamictal and venlafaxine.  She has no rash, itching tremors or shakes.  She is still not able to walk and using wheelchair but she has upcoming appointment to see orthopedics and she may be able to start putting some weight on her leg.  She is pleased that her daughter are very supportive and helping.  Her next injection is on March 11.  She has no tremors or any concern from the Abilify.   Past Psychiatric History:Reviewed. Since 2009 seeing in officeafter released [place].Did IOP. H/Odepression,paranoidandoverdose.H/O inpatient at [facility] August 2016. TookProzac, Klonopin, Wellbutrin andtrazodone.    Psychiatric Specialty Exam: Physical Exam  Review of Systems  Musculoskeletal:       Leg pain. Using  wheel chair    There were no vitals taken for this visit.There is no height or weight on file to calculate BMI.  General Appearance: NA  Eye Contact:  NA  Speech:  Normal Rate  Volume:  Normal  Mood:  Euthymic  Affect:  NA  Thought Process:  Descriptions of Associations: Intact  Orientation:  Full (Time, Place, and Person)  Thought Content:  WDL  Suicidal Thoughts:  No  Homicidal Thoughts:  No  Memory:  Immediate;   Good Recent;   Good Remote;   Good  Judgement:  Intact  Insight:  Present  Psychomotor Activity:  NA  Concentration:  Concentration: Good and Attention Span: Good  Recall:  Good  Fund of Knowledge:  Good  Language:  Good  Akathisia:  No  Handed:  Right  AIMS (if indicated):     Assets:  Communication Skills Desire for Improvement Housing Resilience Social Support  ADL's:  Intact  Cognition:  WNL  Sleep:   fair      Assessment and Plan: Major depressive disorder, recurrent.  Alcohol dependence.  Patient is a stable since taking the Abilify injection.  Discussed medication side effects and benefits.  She cannot afford Abilify because she is a cash pay and like to have samples of Abilify.  We will provide samples Abilify.  Continue Abilify 400 mg intramuscular every 28 days next injection March 11.  Continue venlafaxine 75 mg daily, naltrexone 50 mg daily and Lamictal 200 mg daily.  She is no longer taking oral Abilify.  Recommended to call us back if she  has any question or any concern.  Follow-up in 3 months.  Follow Up Instructions:    I discussed the assessment and treatment plan with the patient. The patient was provided an opportunity to ask questions and all were answered. The patient agreed with the plan and demonstrated an understanding of the instructions.   The patient was advised to call back or seek an in-person evaluation if the symptoms worsen or if the condition fails to improve as anticipated.  I provided 20 minutes of non-face-to-face time  during this encounter.   [name], MD

## 2019-05-03 ENCOUNTER — Ambulatory Visit (INDEPENDENT_AMBULATORY_CARE_PROVIDER_SITE_OTHER): Payer: Self-pay | Admitting: *Deleted

## 2019-05-03 ENCOUNTER — Other Ambulatory Visit: Payer: Self-pay

## 2019-05-03 DIAGNOSIS — F3131 Bipolar disorder, current episode depressed, mild: Secondary | ICD-10-CM
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
## 2019-05-09 NOTE — Telephone Encounter (Deleted)
Received request for refill of ADHD Medication. Maurice Small, MD Current Outpatient Medications  Medication Sig Dispense Refill  . acetaminophen (TYLENOL) 500 MG tablet Take 2 tablets (1,000 mg total) by mouth every 6 (six) hours as needed for mild pain. 30 tablet 0  . ARIPiprazole ER (ABILIFY MAINTENA) 400 MG PRSY prefilled syringe Inject 400 mg into the muscle every 28 (twenty-eight) days. 1 each 1  . cholecalciferol (VITAMIN D) 25 MCG tablet Take 2 tablets (2,000 Units total) by mouth 2 (two) times daily. 120 tablet 0  . diclofenac Sodium (VOLTAREN) 1 % GEL Apply 2 g topically 4 (four) times daily.    Marland Kitchen docusate sodium (COLACE) 100 MG capsule Take 1 capsule (100 mg total) by mouth 2 (two) times daily. 10 capsule 0  . enoxaparin (LOVENOX) 40 MG/0.4ML injection Inject 0.4 mLs (40 mg total) into the skin daily for 14 days. 5.6 mL 0  . esomeprazole (NEXIUM) 40 MG capsule Take 1 capsule (40 mg total) by mouth daily at 12 noon. 3 capsule 0  . gabapentin (NEURONTIN) 100 MG capsule Take 2 capsules (200 mg total) by mouth 3 (three) times daily. 90 capsule 0  . lamoTRIgine (LAMICTAL) 200 MG tablet Take 1 tablet (200 mg total) by mouth daily. For mood 90 tablet 0  . lidocaine (LIDODERM) 5 % Place one patch on left lower extremity every 12 hours as needed for pain. Remove & Discard patch within 12 hours 20 patch 0  . methocarbamol (ROBAXIN) 500 MG tablet Take 1-2 tablets (500-1,000 mg total) by mouth every 8 (eight) hours as needed for muscle spasms. 30 tablet 0  . Multiple Vitamin (MULTIVITAMIN WITH MINERALS) TABS tablet Take 1 tablet by mouth daily.    . naltrexone (DEPADE) 50 MG tablet Take 1 tablet (50 mg total) by mouth daily. 90 tablet 0  . oxyCODONE 10 MG TABS Take 0.5-1 tablets (5-10 mg total) by mouth every 6 (six) hours as needed for moderate pain or severe pain. 30 tablet 0  . polyethylene glycol (MIRALAX / GLYCOLAX) 17 g packet Take 17 g by mouth daily as needed for mild constipation. 14 each 0   . pravastatin (PRAVACHOL) 40 MG tablet Take 40 mg by mouth daily.    Marland Kitchen venlafaxine XR (EFFEXOR-XR) 75 MG 24 hr capsule Take 1 capsule (75 mg total) by mouth daily with breakfast. For mood 90 capsule 0   Current Facility-Administered Medications  Medication Dose Route Frequency Provider Last Rate Last Admin  . ARIPiprazole ER (ABILIFY MAINTENA) 400 MG prefilled syringe 400 mg  400 mg Intramuscular Q28 days Arfeen, Arlyce Harman, MD   400 mg at 05/03/19 1455   Last refill date of {med adhd:312039} Medication: # dispensed: *** # days of med left: *** To be picked up at [facility] pharmacy.  Does Candise seem to have any problems with moodiness, appetite, weight loss, or sleep? {yes [id number] Any complaints by Benjamine Mola about taking the medications? {yes [id number] When was she last examined for ADHD? *** Who is she seeing for his ADHD symptoms? *** When is her next appointment due? *** Rx request routed to Dr. Marland Kitchen for signature.

## 2019-05-10 NOTE — Telephone Encounter (Signed)
Opened in error

## 2019-05-28 ENCOUNTER — Other Ambulatory Visit (HOSPITAL_COMMUNITY): Payer: Self-pay | Admitting: *Deleted

## 2019-05-28 DIAGNOSIS — F331 Major depressive disorder, recurrent, moderate: Secondary | ICD-10-CM

## 2019-05-28 MED ORDER — ABILIFY MAINTENA 400 MG IM PRSY: 400.0000 mg | PREFILLED_SYRINGE | INTRAMUSCULAR | 6 refills | Status: DC

## 2019-05-29 ENCOUNTER — Encounter (HOSPITAL_COMMUNITY): Payer: Self-pay

## 2019-05-29 ENCOUNTER — Other Ambulatory Visit: Payer: Self-pay

## 2019-05-29 ENCOUNTER — Ambulatory Visit (INDEPENDENT_AMBULATORY_CARE_PROVIDER_SITE_OTHER): Payer: Self-pay

## 2019-05-29 DIAGNOSIS — Z79899 Other long term (current) drug therapy: Secondary | ICD-10-CM
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
## 2019-05-30 ENCOUNTER — Ambulatory Visit (HOSPITAL_COMMUNITY): Payer: Self-pay

## 2019-06-25 ENCOUNTER — Ambulatory Visit (INDEPENDENT_AMBULATORY_CARE_PROVIDER_SITE_OTHER): Payer: Self-pay | Admitting: *Deleted

## 2019-06-25 ENCOUNTER — Other Ambulatory Visit: Payer: Self-pay

## 2019-06-25 VITALS — BP 133/81 | HR 99 | Ht 65.0 in | Wt 205.0 lb

## 2019-06-25 DIAGNOSIS — F319 Bipolar disorder, unspecified: Secondary | ICD-10-CM

## 2019-06-25 NOTE — Progress Notes (Signed)
Pt presents today for due Abilify Maintena 400mg  injection. Pt mood and affect appropriate. Pt appropriate and cooperative on approach. Injection given in right deltoid without c/o. Pt says she is very satisfied with the [place] and has been stable since taking. Pt denies any mood changes, s.i., h.i. pt will return in approximately one month for next due injection.

## 2019-06-28 ENCOUNTER — Encounter: Payer: Self-pay | Admitting: *Deleted

## 2019-07-19 ENCOUNTER — Telehealth (INDEPENDENT_AMBULATORY_CARE_PROVIDER_SITE_OTHER): Payer: Self-pay | Admitting: Psychiatry

## 2019-07-19 ENCOUNTER — Other Ambulatory Visit: Payer: Self-pay

## 2019-07-19 DIAGNOSIS — F1010 Alcohol abuse, uncomplicated: Secondary | ICD-10-CM

## 2019-07-19 DIAGNOSIS — F331 Major depressive disorder, recurrent, moderate: Secondary | ICD-10-CM

## 2019-07-19 MED ORDER — LAMOTRIGINE 200 MG PO TABS: 200.0000 mg | ORAL_TABLET | Freq: Every day | ORAL | 0 refills | Status: AC

## 2019-07-19 MED ORDER — VENLAFAXINE HCL ER 75 MG PO CP24: 75.0000 mg | ORAL_CAPSULE | Freq: Every day | ORAL | 0 refills | Status: AC

## 2019-07-19 NOTE — Progress Notes (Signed)
Virtual Visit via Telephone Note  I connected with Donna Black on 07/19/19 at 11:00 AM EDT by telephone and verified that I am speaking with the correct person using two identifiers.   I discussed the limitations, risks, security and privacy concerns of performing an evaluation and management service by telephone and the availability of in person appointments. I also discussed with the patient that there may be a patient responsible charge related to this service. The patient expressed understanding and agreed to proceed.  Patient location; home Provider location; home office  History of Present Illness: Patient is evaluated by phone session.  She is taking medication and getting Abilify injection every 28 days.  She feels much better and she realized that she need to take the medicine to keep her mood stable.  She feels proud that she is not drinking and even though she had been to 2 restaurant but did not drink alcohol.  She endorsed a family member also reported improvement in her mood, depression, anxiety and crying spells.  She denies any paranoia, hallucination or any irritability.  She had a good support from her 2 cousins, friend and daughters.  Recently her daughter Caryl Pina started taking medication for depression and that helps to understand her illness.  She is no longer taking naltrexone because she feels she can stop drinking on her own.  She also started to cut down her pain medicine and take gabapentin only as needed.  She takes narcotic medication only when she is in severe pain.  She is in physical therapy.  Next week she has an appointment with her orthopedic and hoping to come off from the walker.  She is no longer using wheelchair.  Sometimes she does have leg pain but it is not as bad.  She also like to talk about her next meeting in her life.  She has decided to move to [place] near [place] to live with her previous boyfriend.  Patient told her previous boyfriend lou  is very supportive and she know him for a long time.  She is even thinking to start working soon and wanted to have the insurance.  She is hoping to move to [place] on June 11 and she had support from her cousin, friend and daughter in this decision.  However she like to keep appointment until she find a psychiatrist in [place] and like to get Abilify injection and for that she will come every 28 days to get injection.  Eventually her plan is to move to [place] with her boyfriend so she can be close to her daughter.  Her son [name] is also moving next year in [place].  She discussed the plan because she does not want to be decompensated and like to have a good support system around her.  She feels the Effexor, Lamictal and Abilify had helped her a lot and she does not want to change the medication.  She denies any crying spells or any feeling of hopelessness or worthlessness.  She denies any panic attack.  Her level is good.    Past Psychiatric History:Reviewed. Since 2009 seeing in officeafter released [place].Did IOP. H/Odepression,paranoidandoverdose.H/O inpatient at [facility] August 2016. TookProzac, Klonopin, Wellbutrin andtrazodone.   Psychiatric Specialty Exam: Physical Exam  Review of Systems  There were no vitals taken for this visit.There is no height or weight on file to calculate BMI.  General Appearance: NA  Eye Contact:  NA  Speech:  Normal Rate  Volume:  Normal  Mood:  Euthymic  Affect:  NA  Thought Process:  Goal Directed  Orientation:  Full (Time, Place, and Person)  Thought Content:  WDL  Suicidal Thoughts:  No  Homicidal Thoughts:  No  Memory:  Immediate;   Good Recent;   Good Remote;   Good  Judgement:  Good  Insight:  Present  Psychomotor Activity:  NA  Concentration:  Concentration: Good and Attention Span: Good  Recall:  Good  Fund of Knowledge:  Good  Language:  Good  Akathisia:  No  Handed:  Right  AIMS (if indicated):     Assets:   Communication Skills Desire for Improvement Housing Resilience Social Support  ADL's:  Intact  Cognition:  WNL  Sleep:   fair      Assessment and Plan: Major depressive disorder, recurrent.  Alcohol dependence in partial remission.  I had a long discussion with the patient about long-term prognosis, her plan about moving to [place], current medication and her underlying illness.  I talked that the importance is to keep the medication and make sure that she should get the Abilify injection every 28 days to avoid decompensation.  Patient understand the necessity of the medication.  She had stopped the naltrexone and I reinforced that she needs to watch carefully about her drinking as this has precipitated her symptoms.  She agreed with the plan.  She like to keep the appointment virtually and to get injection once a month until she find a psychiatrist who can provide Abilify injection.  I agree with the plan.  We will continue venlafaxine 75 mg daily, Lamictal 200 mg daily and Abilify Maintaina 400 mg IM every 28 days.  Patient has no rash, itching, tremors or shakes.  Recommended to call us back if she has any questions or any concerns.  Follow-up in 3 months.  Time spent 30 minutes.  Follow Up Instructions:    I discussed the assessment and treatment plan with the patient. The patient was provided an opportunity to ask questions and all were answered. The patient agreed with the plan and demonstrated an understanding of the instructions.   The patient was advised to call back or seek an in-person evaluation if the symptoms worsen or if the condition fails to improve as anticipated.  I provided 30 minutes of non-face-to-face time during this encounter.   [name], MD

## 2019-07-24 ENCOUNTER — Other Ambulatory Visit: Payer: Self-pay

## 2019-07-24 ENCOUNTER — Ambulatory Visit (INDEPENDENT_AMBULATORY_CARE_PROVIDER_SITE_OTHER): Payer: Self-pay | Admitting: *Deleted

## 2019-07-24 DIAGNOSIS — F3131 Bipolar disorder, current episode depressed, mild: Secondary | ICD-10-CM

## 2019-07-24 NOTE — Progress Notes (Signed)
Pt presents to clinic today for due Abilify 400 mg injection. Pt is appropriate and cooperative on approach. Injection given in left deltoid without c/o. Vss. Pt has no problems or c/o and states the medication is working very well for her. Pt to return in approximately 28 days.

## 2019-07-25 ENCOUNTER — Ambulatory Visit (HOSPITAL_COMMUNITY): Payer: Self-pay

## 2019-07-25 ENCOUNTER — Ambulatory Visit (HOSPITAL_COMMUNITY): Payer: Self-pay | Admitting: Psychiatry

## 2019-08-08 ENCOUNTER — Telehealth (HOSPITAL_COMMUNITY): Payer: Self-pay | Admitting: *Deleted

## 2019-08-08 NOTE — Telephone Encounter (Signed)
[facility]yI pt has been rejected by [employer] Patient Assistance Program to cover her Abilify Maintena 400mg  as she doe snot have a dx of bi-polar or schizophrenia.

## 2019-08-28 ENCOUNTER — Telehealth (HOSPITAL_COMMUNITY): Payer: Self-pay | Admitting: *Deleted

## 2019-08-28 NOTE — Telephone Encounter (Signed)
Ok.she should keep virtual appointment until find a new provider.

## 2019-08-28 NOTE — Telephone Encounter (Signed)
This nurse spoke with pt regarding her move to [place]., as you're aware, and not being able to get her monthly Abilify Maintena 400mg  injection until she sees new provider in [place]. So pt is asking for fill of the Abilify 20mg , per history, until she sees new doc on 7/15. I will send in. Have to find pharmacy first.

## 2019-08-29 ENCOUNTER — Other Ambulatory Visit (HOSPITAL_COMMUNITY): Payer: Self-pay | Admitting: *Deleted

## 2019-08-29 ENCOUNTER — Telehealth (HOSPITAL_COMMUNITY): Payer: Self-pay | Admitting: *Deleted

## 2019-08-29 MED ORDER — ARIPIPRAZOLE 20 MG PO TABS: 20.0000 mg | ORAL_TABLET | Freq: Every day | ORAL | 0 refills | Status: AC

## 2019-08-29 NOTE — Telephone Encounter (Signed)
Writer spoke with pt to verify pharmacy in [place]. Abilify 20mg  #30 sent to [place], [place]. Pt has appointment on 09/04/19 with new provider in [place] but does intend to keep appointment with Dr. Adele Schilder on 10/18/19. Writer discussed pt assistance program through [employer] for the [employer]. Pt verbalizes understanding.

## 2019-08-29 NOTE — Telephone Encounter (Signed)
She has an appointment on 7/15 with new provider.

## 2019-10-18 ENCOUNTER — Encounter (HOSPITAL_COMMUNITY): Payer: Self-pay | Admitting: Psychiatry

## 2019-10-18 ENCOUNTER — Telehealth (INDEPENDENT_AMBULATORY_CARE_PROVIDER_SITE_OTHER): Payer: Self-pay | Admitting: Psychiatry

## 2019-10-18 ENCOUNTER — Other Ambulatory Visit: Payer: Self-pay

## 2019-10-18 VITALS — Wt 204.0 lb

## 2019-10-18 DIAGNOSIS — F1010 Alcohol abuse, uncomplicated: Secondary | ICD-10-CM

## 2019-10-18 DIAGNOSIS — F331 Major depressive disorder, recurrent, moderate: Secondary | ICD-10-CM

## 2019-10-18 NOTE — Progress Notes (Signed)
Virtual Visit via Telephone Note  I connected with Donna Black on 10/18/19 at 10:00 AM EDT by telephone and verified that I am speaking with the correct person using two identifiers.  Location: Patient: home  Provider: home office   I discussed the limitations, risks, security and privacy concerns of performing an evaluation and management service by telephone and the availability of in person appointments. I also discussed with the patient that there may be a patient responsible charge related to this service. The patient expressed understanding and agreed to proceed.   History of Present Illness: Patient is evaluated by phone session.  She is now moved to [place] to live with her ex-boyfriend.  She is very happy and she reported things are going very well.  She is hoping to start a new job in few days.  She is going to work in [employer]  Her pain is much better and she does not take any more narcotic pain meds.  She started walking 50% of the time without a cane.  She is proud that she is not drinking alcohol.  She is in touch with her twin daughter Caryl Pina and [name] and also talked to her son [name] regularly.  She is no longer using wheelchair.  She had a first visit with her psychiatrist Dr. Zoila Shutter in [facility].  She is pleased that no medicines were changed but she is not taking Abilify injection.  Instead she is taking Abilify 20 mg daily and that is keeping her mood, depression and anxiety is stable.  She denies any mania, psychosis or any hallucination.  She has no tremors shakes and EPS.  She is also compliant with venlafaxine and Lamictal and reported no rash, itching.  Her sleep is good.   Past Psychiatric History:Reviewed. Since 2009 seeing in officeafter released [place].Did IOP. H/Odepression,paranoidandoverdose.H/O inpatient at [facility] August 2016. TookProzac, Klonopin, Wellbutrin andtrazodone.   Psychiatric Specialty Exam: Physical Exam   Review of Systems  Weight 204 lb (92.5 kg).There is no height or weight on file to calculate BMI.  General Appearance: NA  Eye Contact:  NA  Speech:  Clear and Coherent  Volume:  Normal  Mood:  pleasent  Affect:  NA  Thought Process:  Goal Directed  Orientation:  Full (Time, Place, and Person)  Thought Content:  WDL  Suicidal Thoughts:  No  Homicidal Thoughts:  No  Memory:  Immediate;   Good Recent;   Good Remote;   Good  Judgement:  Good  Insight:  Present  Psychomotor Activity:  NA  Concentration:  Concentration: Good and Attention Span: Good  Recall:  Good  Fund of Knowledge:  Good  Language:  Good  Akathisia:  No  Handed:  Right  AIMS (if indicated):     Assets:  Communication Skills Desire for Improvement Housing Resilience Social Support Talents/Skills Transportation  ADL's:  Intact  Cognition:  WNL  Sleep:   ok      Assessment and Plan: Major depressive disorder, recurrent.  Alcohol abuse in partial remission.  Patient is moved to [place] and already seen once a psychiatrist and she is relieved that medicines were not changed.  I encourage to have her psychiatrist send [name] the consent so we can send her records to psychiatrist.  She agreed.  I wish her good luck.  She is doing better.  She reported no side effects.  She will follow-up with a new psychiatrist in [place].  All her refills were given by a new psychiatrist.  We had not provide any new medication.  Follow Up Instructions:    I discussed the assessment and treatment plan with the patient. The patient was provided an opportunity to ask questions and all were answered. The patient agreed with the plan and demonstrated an understanding of the instructions.   The patient was advised to call back or seek an in-person evaluation if the symptoms worsen or if the condition fails to improve as anticipated.  I provided 12 minutes of non-face-to-face time during this encounter.   [name],  MD

## 2020-06-10 IMAGING — CR DG PELVIS 1-2V
1 series · 1 of 1 positions shown · non-contrast
Comparison: February 27, 2019.

CLINICAL DATA: Status post fracture.

EXAM:
PELVIS - 1-2 VIEW

[pelvis ap]
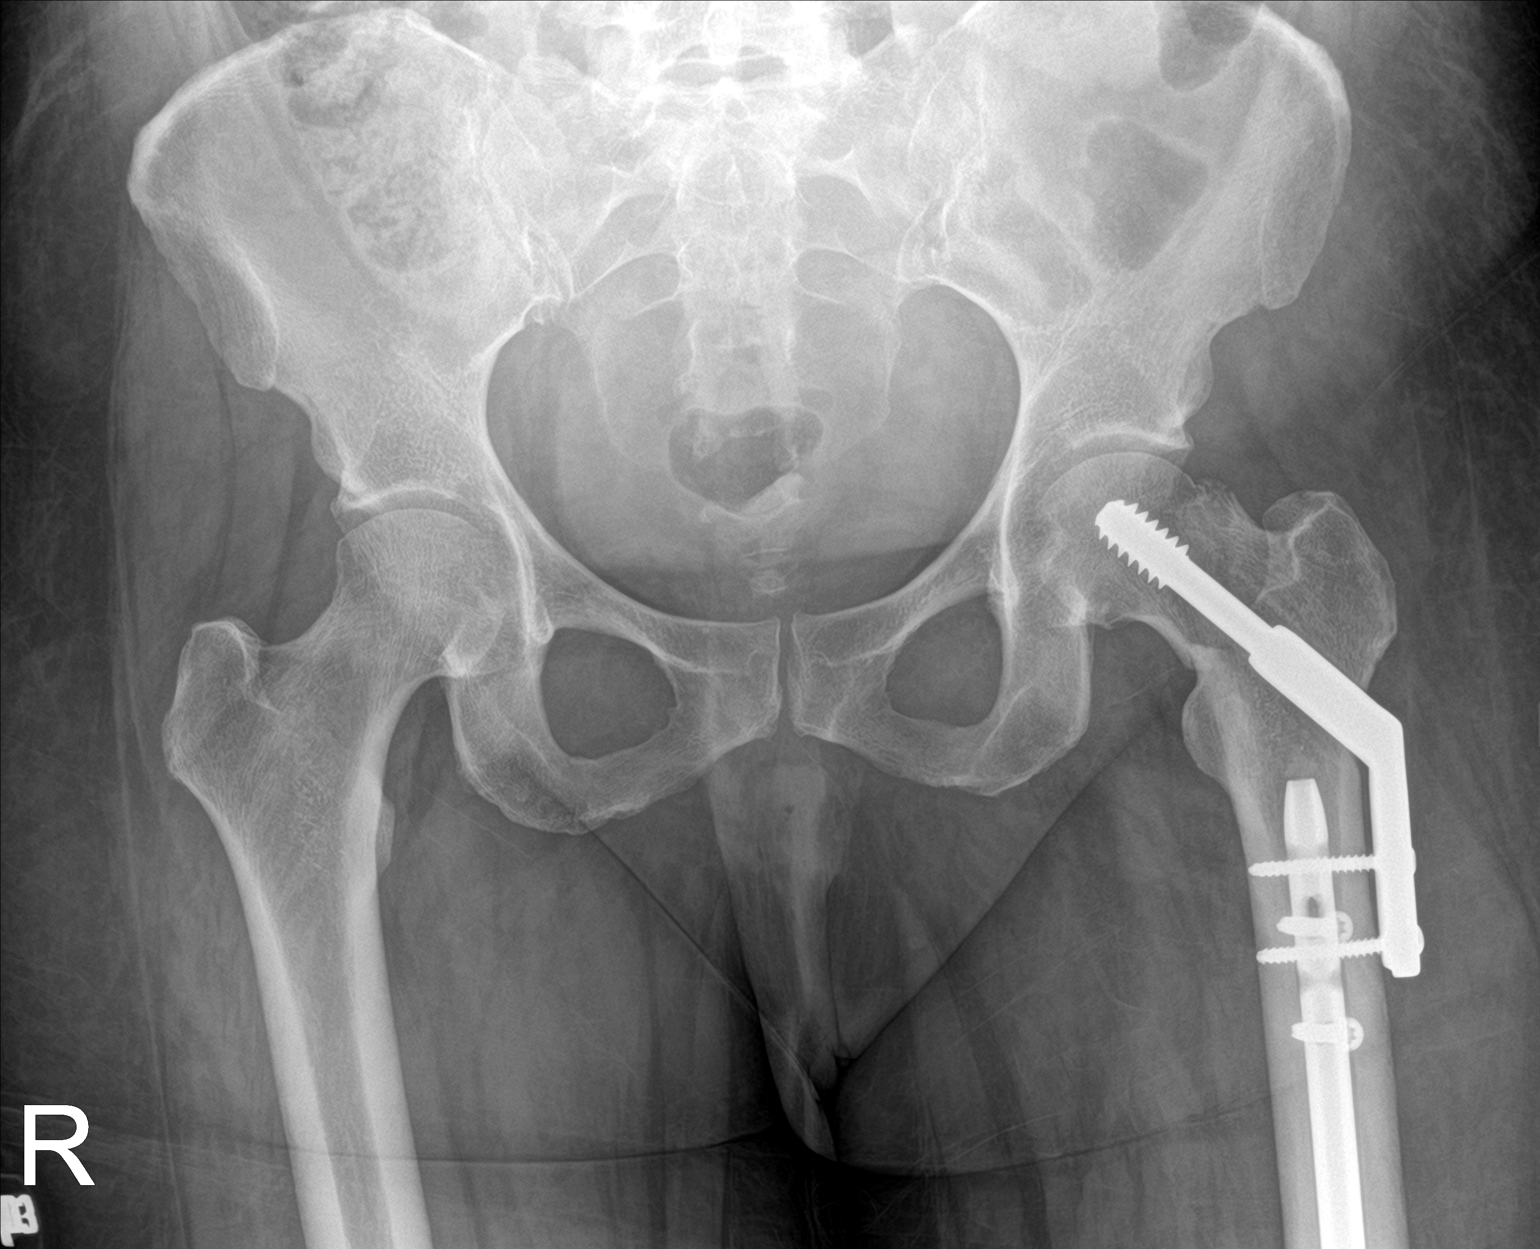

[1 of 1 positions shown; findings below may reference images not displayed]

FINDINGS: Status post surgical internal fixation of left proximal femoral neck
fracture. Good alignment of fracture components is noted. No new
fracture or dislocation is noted. Joint spaces are intact.
IMPRESSION: Status post surgical internal fixation of left proximal femoral neck
fracture.

## 2020-06-10 IMAGING — CR DG ANKLE COMPLETE 3+V*R*
3 series · 3 of 3 positions shown · non-contrast
Comparison: February 28, 2019.

CLINICAL DATA: Multiple leg fractures.

EXAM:
RIGHT ANKLE - COMPLETE 3+ VIEW

[ankle ap]
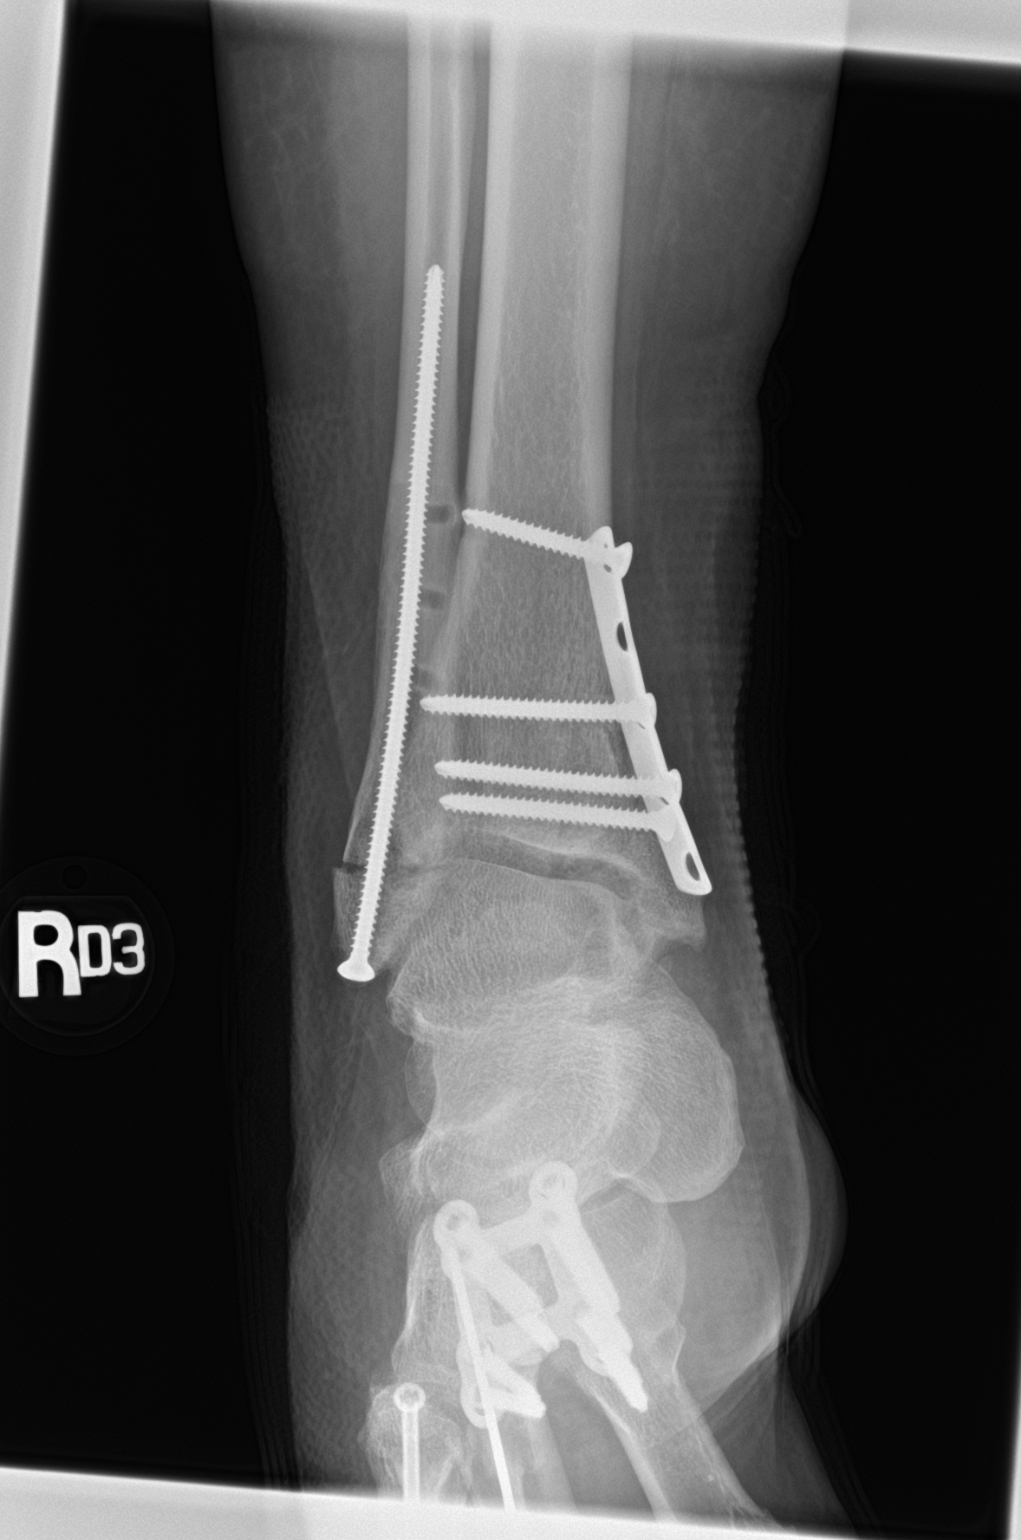

[ankle obl]
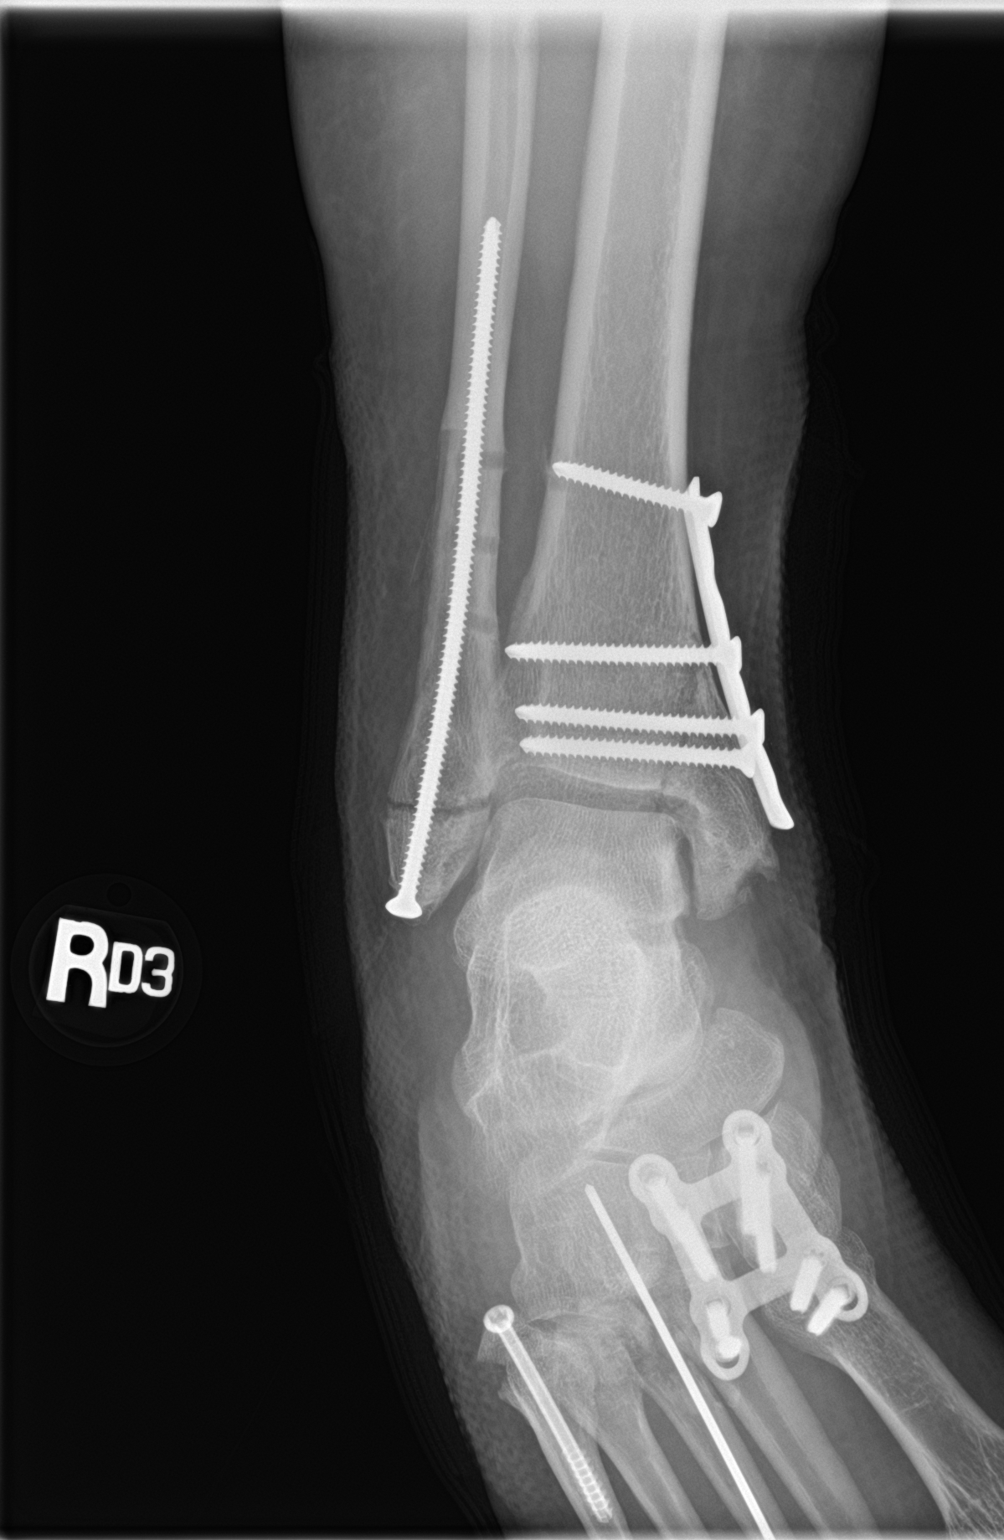

[ankle lat]
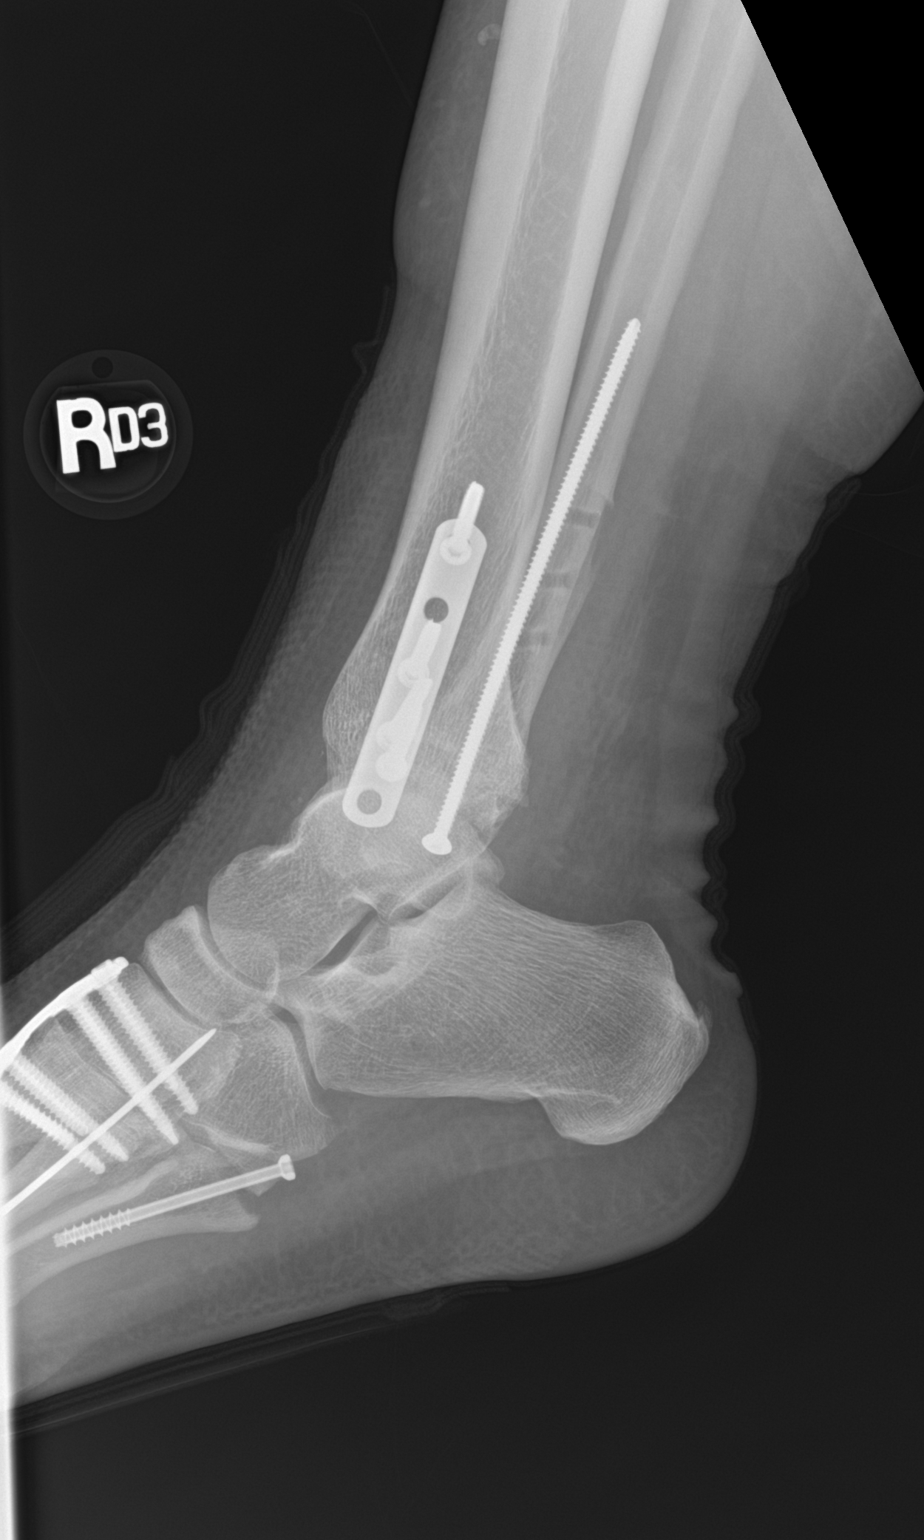

[3 of 3 positions shown; findings below may reference images not displayed]

FINDINGS: Status post surgical internal fixation of distal right tibial and
fibular fractures. Good alignment of fracture components is noted.
No new fracture or dislocation is noted.
IMPRESSION: Status post surgical internal fixation of distal right tibial and
fibular fractures.
